# Patient Record
Sex: Female | Born: 1950
Health system: Southern US, Community
[De-identification: ages and names within clinical notes are randomized; demographics above are authoritative.]

## PROBLEM LIST (undated history)

## (undated) DIAGNOSIS — I10 Essential (primary) hypertension: Secondary | ICD-10-CM

## (undated) DIAGNOSIS — I609 Nontraumatic subarachnoid hemorrhage, unspecified: Secondary | ICD-10-CM

## (undated) DIAGNOSIS — F32A Depression, unspecified: Secondary | ICD-10-CM

## (undated) DIAGNOSIS — N390 Urinary tract infection, site not specified: Secondary | ICD-10-CM

## (undated) DIAGNOSIS — E119 Type 2 diabetes mellitus without complications: Secondary | ICD-10-CM

## (undated) DIAGNOSIS — G40909 Epilepsy, unspecified, not intractable, without status epilepticus: Secondary | ICD-10-CM

## (undated) DIAGNOSIS — F909 Attention-deficit hyperactivity disorder, unspecified type: Secondary | ICD-10-CM

## (undated) DIAGNOSIS — I639 Cerebral infarction, unspecified: Secondary | ICD-10-CM

## (undated) DIAGNOSIS — I2699 Other pulmonary embolism without acute cor pulmonale: Secondary | ICD-10-CM

## (undated) DIAGNOSIS — F329 Major depressive disorder, single episode, unspecified: Secondary | ICD-10-CM

## (undated) DIAGNOSIS — K219 Gastro-esophageal reflux disease without esophagitis: Secondary | ICD-10-CM

## (undated) HISTORY — DX: Gastro-esophageal reflux disease without esophagitis: K21.9

## (undated) HISTORY — DX: Major depressive disorder, single episode, unspecified: F32.9

## (undated) HISTORY — DX: Urinary tract infection, site not specified: N39.0

## (undated) HISTORY — DX: Type 2 diabetes mellitus without complications: E11.9

## (undated) HISTORY — DX: Essential (primary) hypertension: I10

## (undated) HISTORY — DX: Depression, unspecified: F32.A

## (undated) HISTORY — PX: ABDOMINAL HYSTERECTOMY: SHX81

## (undated) HISTORY — DX: Cerebral infarction, unspecified: I63.9

## (undated) HISTORY — DX: Attention-deficit hyperactivity disorder, unspecified type: F90.9

---

## 2006-12-27 ENCOUNTER — Emergency Department (HOSPITAL_COMMUNITY): Admission: EM | Admit: 2006-12-27 | Discharge: 2006-12-27 | Payer: Self-pay | Admitting: *Deleted

## 2011-07-08 ENCOUNTER — Emergency Department (HOSPITAL_COMMUNITY)
Admission: EM | Admit: 2011-07-08 | Discharge: 2011-07-08 | Disposition: A | Discharge status: Home / Self Care | Payer: Self-pay | Attending: Emergency Medicine | Admitting: Emergency Medicine

## 2011-07-08 ENCOUNTER — Encounter (HOSPITAL_COMMUNITY): Payer: Self-pay | Admitting: *Deleted

## 2011-07-08 DIAGNOSIS — M545 Low back pain, unspecified: Secondary | ICD-10-CM | POA: Insufficient documentation

## 2011-07-08 DIAGNOSIS — R51 Headache: Secondary | ICD-10-CM

## 2011-07-08 DIAGNOSIS — M549 Dorsalgia, unspecified: Secondary | ICD-10-CM

## 2011-07-08 DIAGNOSIS — I10 Essential (primary) hypertension: Secondary | ICD-10-CM | POA: Insufficient documentation

## 2011-07-08 DIAGNOSIS — A599 Trichomoniasis, unspecified: Secondary | ICD-10-CM | POA: Insufficient documentation

## 2011-07-08 LAB — CBC WITH DIFFERENTIAL/PLATELET
Basophils Absolute: 0 10*3/uL (ref 0.0–0.1)
Basophils Relative: 0 % (ref 0–1)
Hemoglobin: 12.6 g/dL (ref 12.0–15.0)
Lymphocytes Relative: 22 % (ref 12–46)
Monocytes Absolute: 0.5 10*3/uL (ref 0.1–1.0)
Monocytes Relative: 7 % (ref 3–12)
Neutrophils Relative %: 70 % (ref 43–77)
Platelets: 245 10*3/uL (ref 150–400)
RDW: 15 % (ref 11.5–15.5)
WBC: 7.7 10*3/uL (ref 4.0–10.5)

## 2011-07-08 LAB — URINALYSIS, ROUTINE W REFLEX MICROSCOPIC
Bilirubin Urine: NEGATIVE
Glucose, UA: NEGATIVE mg/dL
Ketones, ur: NEGATIVE mg/dL
Protein, ur: NEGATIVE mg/dL
Urobilinogen, UA: 1 mg/dL (ref 0.0–1.0)
pH: 6.5 (ref 5.0–8.0)

## 2011-07-08 LAB — BASIC METABOLIC PANEL
Calcium: 9.7 mg/dL (ref 8.4–10.5)
GFR calc non Af Amer: 67 mL/min — ABNORMAL LOW (ref >90)
Potassium: 3.5 mEq/L (ref 3.5–5.1)
Sodium: 138 mEq/L (ref 135–145)

## 2011-07-08 MED ORDER — METOCLOPRAMIDE HCL 5 MG/ML IJ SOLN
10.0000 mg | Freq: Once | INTRAMUSCULAR | Status: AC
  Administered 2011-07-08: 10 mg via INTRAVENOUS
  Filled 2011-07-08: qty 2

## 2011-07-08 MED ORDER — KETOROLAC TROMETHAMINE 30 MG/ML IJ SOLN
30.0000 mg | Freq: Once | INTRAMUSCULAR | Status: AC
  Administered 2011-07-08: 30 mg via INTRAVENOUS
  Filled 2011-07-08: qty 1

## 2011-07-08 MED ORDER — HYDROCHLOROTHIAZIDE 25 MG PO TABS: 25.0000 mg | ORAL_TABLET | Freq: Every day | ORAL | Status: DC

## 2011-07-08 MED ORDER — HYDROCODONE-ACETAMINOPHEN 5-325 MG PO TABS: ORAL_TABLET | ORAL | Status: AC

## 2011-07-08 MED ORDER — SODIUM CHLORIDE 0.9 % IV BOLUS (SEPSIS)
500.0000 mL | Freq: Once | INTRAVENOUS | Status: AC
  Administered 2011-07-08: 500 mL via INTRAVENOUS

## 2011-07-08 MED ORDER — METRONIDAZOLE 500 MG PO TABS: ORAL_TABLET | ORAL | Status: DC

## 2011-07-08 MED ORDER — SODIUM CHLORIDE 0.9 % IV BOLUS (SEPSIS)
500.0000 mL | Freq: Once | INTRAVENOUS | Status: AC
  Administered 2011-07-08: 17:00:00 via INTRAVENOUS

## 2011-07-08 NOTE — ED Notes (Signed)
Headache with lower back pain and nausea that started x 5 days ago.

## 2011-07-08 NOTE — ED Provider Notes (Signed)
History     CSN: [id number]  Arrival date & time 07/08/11  1608   First MD Initiated Contact with Patient 07/08/11 1623      Chief Complaint  Patient presents with  . Headache  . Back Pain    (Consider location/radiation/quality/duration/timing/severity/associated sxs/prior treatment) HPI Comments: Patient complains of a frontal headache that began 5 days ago. Describes the headache as a throbbing sensation. States the headache is waxing and waning, but not hurting at present. She also reports some nausea and photophobia. Patient also complains of pain to her lower back for 3-4 days. Describes the pain as aching. Pain is worse with movement and bending and improves with rest. She also reports some dysuria and urinary frequency. She denies any abdominal pain, leg pain, perineal numbness, shortness of breath or weakness.  Patient is a 61 y.o. female presenting with headaches and back pain. The history is provided by the patient.  Headache  This is a new problem. The current episode started more than 2 days ago. The problem occurs every few minutes. Progression since onset: waxing and waning. The headache is associated with nothing. The pain is located in the frontal and temporal region. The quality of the pain is described as throbbing. The pain is mild. The pain does not radiate. Associated symptoms include nausea and vomiting. Pertinent negatives include no fever, no malaise/fatigue, no near-syncope, no orthopnea, no palpitations, no syncope and no shortness of breath. She has tried NSAIDs for the symptoms. The treatment provided mild relief.  Back Pain  This is a new problem. The current episode started more than 2 days ago. The problem occurs constantly. The problem has not changed since onset.The pain is associated with no known injury. The pain is present in the lumbar spine. The quality of the pain is described as aching. The pain does not radiate. The pain is moderate. The symptoms are  aggravated by bending, twisting and certain positions. The pain is the same all the time. Associated symptoms include headaches and dysuria. Pertinent negatives include no chest pain, no fever, no numbness, no abdominal pain, no abdominal swelling, no bowel incontinence, no perianal numbness, no bladder incontinence, no pelvic pain, no leg pain, no paresthesias, no paresis, no tingling and no weakness. She has tried NSAIDs for the symptoms. The treatment provided mild relief.    Past Medical History  Diagnosis Date  . Hypertension     Past Surgical History  Procedure Date  . Abdominal hysterectomy     No family history on file.  History  Substance Use Topics  . Smoking status: Never Smoker   . Smokeless tobacco: Not on file  . Alcohol Use: No    OB History    Grav Para Term Preterm Abortions TAB SAB Ect Mult Living                  Review of Systems  Constitutional: Negative for fever, malaise/fatigue, activity change and appetite change.  HENT: Negative for facial swelling, neck pain and neck stiffness.   Eyes: Positive for photophobia. Negative for visual disturbance.  Respiratory: Negative for chest tightness and shortness of breath.   Cardiovascular: Negative for chest pain, palpitations, orthopnea, syncope and near-syncope.  Gastrointestinal: Positive for nausea and vomiting. Negative for abdominal pain, constipation and bowel incontinence.  Genitourinary: Positive for dysuria and frequency. Negative for bladder incontinence, hematuria, flank pain, decreased urine volume, vaginal bleeding, vaginal discharge, difficulty urinating, vaginal pain and pelvic pain.  Perineal numbness or incontinence of urine or feces  Musculoskeletal: Positive for back pain. Negative for joint swelling.  Skin: Negative.  Negative for rash.  Neurological: Positive for headaches. Negative for dizziness, tingling, facial asymmetry, weakness, numbness and paresthesias.  All other systems  reviewed and are negative.    Allergies  Review of patient's allergies indicates no known allergies.  Home Medications   Current Outpatient Rx  Name Route Sig Dispense Refill  . IBUPROFEN 200 MG PO TABS Oral Take 200 mg by mouth once as needed. For pain/relief      Pulse 74  Temp 99.4 F (37.4 C) (Oral)  Resp 20  Ht 5\' 5"  (1.651 m)  Wt 222 lb 7 oz (100.897 kg)  BMI 37.02 kg/m2  SpO2 97%  Physical Exam  Nursing note and vitals reviewed. Constitutional: She is oriented to person, place, and time. She appears well-developed and well-nourished. No distress.  HENT:  Head: Normocephalic and atraumatic.  Mouth/Throat: Oropharynx is clear and moist.  Eyes: EOM are normal. Pupils are equal, round, and reactive to light.  Neck: Normal range of motion and phonation normal. Neck supple. No rigidity. No Brudzinski's sign and no Kernig's sign noted.  Cardiovascular: Normal rate, regular rhythm, normal heart sounds and intact distal pulses.   No murmur heard. Pulmonary/Chest: Effort normal and breath sounds normal. No respiratory distress. She exhibits no tenderness.  Abdominal: Soft. She exhibits no distension and no mass. There is no tenderness. There is no rebound and no guarding.  Musculoskeletal: Normal range of motion. She exhibits tenderness. She exhibits no edema.       Lumbar back: She exhibits tenderness and pain. She exhibits normal range of motion, no swelling, no deformity, no laceration and normal pulse.       Back:  Neurological: She is alert and oriented to person, place, and time. No cranial nerve deficit or sensory deficit. She exhibits normal muscle tone. Coordination and gait normal.  Reflex Scores:      Tricep reflexes are 2+ on the right side and 2+ on the left side.      Bicep reflexes are 2+ on the right side and 2+ on the left side.      Patellar reflexes are 2+ on the right side and 2+ on the left side.      Achilles reflexes are 2+ on the right side and 2+ on  the left side. Skin: Skin is warm and dry.  Psychiatric: She has a normal mood and affect. Thought content normal.    ED Course  Procedures (including critical care time)  Results for orders placed during the hospital encounter of 07/08/11  CBC WITH DIFFERENTIAL      Component Value Range   WBC 7.7  4.0 - 10.5 K/uL   RBC 4.87  3.87 - 5.11 MIL/uL   Hemoglobin 12.6  12.0 - 15.0 g/dL   HCT 91.4  78.2 - 95.6 %   MCV 80.3  78.0 - 100.0 fL   MCH 25.9 (*) 26.0 - 34.0 pg   MCHC 32.2  30.0 - 36.0 g/dL   RDW 21.3  08.6 - 57.8 %   Platelets 245  150 - 400 K/uL   Neutrophils Relative 70  43 - 77 %   Neutro Abs 5.4  1.7 - 7.7 K/uL   Lymphocytes Relative 22  12 - 46 %   Lymphs Abs 1.7  0.7 - 4.0 K/uL   Monocytes Relative 7  3 - 12 %   Monocytes  Absolute 0.5  0.1 - 1.0 K/uL   Eosinophils Relative 0  0 - 5 %   Eosinophils Absolute 0.0  0.0 - 0.7 K/uL   Basophils Relative 0  0 - 1 %   Basophils Absolute 0.0  0.0 - 0.1 K/uL  BASIC METABOLIC PANEL      Component Value Range   Sodium 138  135 - 145 mEq/L   Potassium 3.5  3.5 - 5.1 mEq/L   Chloride 98  96 - 112 mEq/L   CO2 29  19 - 32 mEq/L   Glucose, Bld 147 (*) 70 - 99 mg/dL   BUN 12  6 - 23 mg/dL   Creatinine, Ser 1.61  0.50 - 1.10 mg/dL   Calcium 9.7  8.4 - 09.6 mg/dL   GFR calc non Af Amer 67 (*) >90 mL/min   GFR calc Af Amer 78 (*) >90 mL/min  URINALYSIS, ROUTINE W REFLEX MICROSCOPIC      Component Value Range   Color, Urine YELLOW  YELLOW   APPearance CLEAR  CLEAR   Specific Gravity, Urine 1.015  1.005 - 1.030   pH 6.5  5.0 - 8.0   Glucose, UA NEGATIVE  NEGATIVE mg/dL   Hgb urine dipstick TRACE (*) NEGATIVE   Bilirubin Urine NEGATIVE  NEGATIVE   Ketones, ur NEGATIVE  NEGATIVE mg/dL   Protein, ur NEGATIVE  NEGATIVE mg/dL   Urobilinogen, UA 1.0  0.0 - 1.0 mg/dL   Nitrite NEGATIVE  NEGATIVE   Leukocytes, UA NEGATIVE  NEGATIVE  URINE MICROSCOPIC-ADD ON      Component Value Range   Squamous Epithelial / LPF FEW (*) RARE   WBC,  UA 0-2  <3 WBC/hpf   RBC / HPF 0-2  <3 RBC/hpf   Bacteria, UA FEW (*) RARE   Urine-Other TRICHOMONAS PRESENT    URINE CULTURE      Component Value Range   Specimen Description URINE, CLEAN CATCH     Special Requests NONE     Culture  Setup Time 07/09/2011 19:59     Colony Count 10,000 COLONIES/ML     Culture       Value: Multiple bacterial morphotypes present, none predominant. Suggest appropriate recollection if clinically indicated.   Report Status 07/10/2011 FINAL         Urine culture is pending  MDM    Patient has ttp of the lumbar paraspinal muscles.  No focal neuro deficits on exam.  Ambulates with a steady gait.  Vitals stable,  Pt is non-toxic appearing.  No focal neuro deficits, no meningeal signs.  Headache of gradual onset that is waxing and waning, not hurting at present. Doubt infectious or neurological process. Patient agrees to close f/u with PMD or to return here if the symptoms worsen.      Will treat her for trichomonas.  Pt also hypertensive during ED stay.  Has hx of same but states she does not currently take antihypertensive medication but has been prescribed in the past.  I will start HCTZ and pt agrees to close f/u with the health dept and verbalized understanding of instructions.  The patient appears reasonably screened and/or stabilized for discharge and I doubt any other medical condition or other [facility] requiring further screening, evaluation, or treatment in the ED at this time prior to discharge.   Diania Co L. Hanna, [place] 07/12/11 2221

## 2011-07-10 LAB — URINE CULTURE

## 2011-07-13 NOTE — ED Provider Notes (Signed)
Medical screening examination/treatment/procedure(s) were performed by non-physician practitioner and as supervising physician I was immediately available for consultation/collaboration.   Espyn Radwan M Lindsey Hommel, DO 07/13/11 1251 

## 2011-08-16 ENCOUNTER — Inpatient Hospital Stay (HOSPITAL_COMMUNITY)
Admission: EM | Admit: 2011-08-16 | Discharge: 2011-09-08 | DRG: 020 | Disposition: A | Discharge status: Rehabilitation Facility | Payer: Medicaid Other | Attending: Neurosurgery | Admitting: Neurosurgery

## 2011-08-16 ENCOUNTER — Encounter (HOSPITAL_COMMUNITY): Payer: Self-pay | Admitting: *Deleted

## 2011-08-16 ENCOUNTER — Emergency Department (HOSPITAL_COMMUNITY): Payer: Medicaid Other

## 2011-08-16 DIAGNOSIS — G9389 Other specified disorders of brain: Secondary | ICD-10-CM | POA: Diagnosis not present

## 2011-08-16 DIAGNOSIS — J690 Pneumonitis due to inhalation of food and vomit: Secondary | ICD-10-CM | POA: Diagnosis present

## 2011-08-16 DIAGNOSIS — H5010 Unspecified exotropia: Secondary | ICD-10-CM | POA: Diagnosis present

## 2011-08-16 DIAGNOSIS — Z8249 Family history of ischemic heart disease and other diseases of the circulatory system: Secondary | ICD-10-CM

## 2011-08-16 DIAGNOSIS — J9600 Acute respiratory failure, unspecified whether with hypoxia or hypercapnia: Secondary | ICD-10-CM | POA: Diagnosis present

## 2011-08-16 DIAGNOSIS — G9340 Encephalopathy, unspecified: Secondary | ICD-10-CM | POA: Diagnosis present

## 2011-08-16 DIAGNOSIS — R7309 Other abnormal glucose: Secondary | ICD-10-CM | POA: Diagnosis not present

## 2011-08-16 DIAGNOSIS — I609 Nontraumatic subarachnoid hemorrhage, unspecified: Secondary | ICD-10-CM

## 2011-08-16 DIAGNOSIS — Z6835 Body mass index (BMI) 35.0-35.9, adult: Secondary | ICD-10-CM

## 2011-08-16 DIAGNOSIS — Z515 Encounter for palliative care: Secondary | ICD-10-CM

## 2011-08-16 DIAGNOSIS — I739 Peripheral vascular disease, unspecified: Secondary | ICD-10-CM | POA: Diagnosis present

## 2011-08-16 DIAGNOSIS — G911 Obstructive hydrocephalus: Secondary | ICD-10-CM | POA: Diagnosis present

## 2011-08-16 DIAGNOSIS — Z823 Family history of stroke: Secondary | ICD-10-CM

## 2011-08-16 DIAGNOSIS — E739 Lactose intolerance, unspecified: Secondary | ICD-10-CM | POA: Diagnosis present

## 2011-08-16 DIAGNOSIS — Z90710 Acquired absence of both cervix and uterus: Secondary | ICD-10-CM

## 2011-08-16 DIAGNOSIS — I10 Essential (primary) hypertension: Secondary | ICD-10-CM | POA: Diagnosis present

## 2011-08-16 DIAGNOSIS — R58 Hemorrhage, not elsewhere classified: Secondary | ICD-10-CM

## 2011-08-16 DIAGNOSIS — E876 Hypokalemia: Secondary | ICD-10-CM | POA: Diagnosis not present

## 2011-08-16 HISTORY — PX: ANEURYSM COILING: SHX5349

## 2011-08-16 HISTORY — DX: Nontraumatic subarachnoid hemorrhage, unspecified: I60.9

## 2011-08-16 LAB — MRSA PCR SCREENING: MRSA by PCR: NEGATIVE

## 2011-08-16 LAB — BLOOD GAS, ARTERIAL
Acid-base deficit: 0.1 mmol/L (ref 0.0–2.0)
FIO2: 50 %
MECHVT: 600 mL
O2 Saturation: 96.5 %
TCO2: 21.4 mmol/L (ref 0–100)
pCO2 arterial: 40 mmHg (ref 35.0–45.0)

## 2011-08-16 LAB — CBC WITH DIFFERENTIAL/PLATELET
Basophils Absolute: 0.1 10*3/uL (ref 0.0–0.1)
Basophils Relative: 0 % (ref 0–1)
Eosinophils Absolute: 0.2 10*3/uL (ref 0.0–0.7)
HCT: 38.4 % (ref 36.0–46.0)
Hemoglobin: 12.4 g/dL (ref 12.0–15.0)
MCHC: 32.3 g/dL (ref 30.0–36.0)
MCV: 82.1 fL (ref 78.0–100.0)
Monocytes Absolute: 0.5 10*3/uL (ref 0.1–1.0)
Monocytes Relative: 4 % (ref 3–12)
Platelets: 303 10*3/uL (ref 150–400)
RDW: 15.2 % (ref 11.5–15.5)
WBC: 12.4 10*3/uL — ABNORMAL HIGH (ref 4.0–10.5)

## 2011-08-16 LAB — COMPREHENSIVE METABOLIC PANEL
AST: 45 U/L — ABNORMAL HIGH (ref 0–37)
Chloride: 100 mEq/L (ref 96–112)
GFR calc non Af Amer: 61 mL/min — ABNORMAL LOW (ref >90)
Sodium: 138 mEq/L (ref 135–145)
Total Bilirubin: 0.5 mg/dL (ref 0.3–1.2)

## 2011-08-16 LAB — RAPID URINE DRUG SCREEN, HOSP PERFORMED
Amphetamines: NOT DETECTED
Benzodiazepines: NOT DETECTED
Tetrahydrocannabinol: NOT DETECTED

## 2011-08-16 LAB — TROPONIN I: Troponin I: <0.3 ng/mL (ref <0.30)

## 2011-08-16 LAB — CK TOTAL AND CKMB (NOT AT ARMC)
CK, MB: 6.4 ng/mL (ref 0.3–4.0)
Relative Index: 1.5 (ref 0.0–2.5)
Total CK: 428 U/L — ABNORMAL HIGH (ref 7–177)

## 2011-08-16 LAB — ETHANOL: Alcohol, Ethyl (B): <11 mg/dL (ref 0–11)

## 2011-08-16 MED ORDER — IPRATROPIUM BROMIDE 0.02 % IN SOLN: 0.5000 mg | RESPIRATORY_TRACT | Status: DC | PRN

## 2011-08-16 MED ORDER — ROCURONIUM BROMIDE 50 MG/5ML IV SOLN
INTRAVENOUS | Status: AC
  Filled 2011-08-16: qty 2

## 2011-08-16 MED ORDER — LIDOCAINE HCL (CARDIAC) 20 MG/ML IV SOLN
INTRAVENOUS | Status: AC
  Filled 2011-08-16: qty 5

## 2011-08-16 MED ORDER — LORAZEPAM 2 MG/ML IJ SOLN
2.0000 mg | Freq: Once | INTRAMUSCULAR | Status: AC
  Administered 2011-08-16: 2 mg via INTRAVENOUS
  Filled 2011-08-16: qty 1

## 2011-08-16 MED ORDER — ALBUTEROL SULFATE (5 MG/ML) 0.5% IN NEBU: 2.5000 mg | INHALATION_SOLUTION | RESPIRATORY_TRACT | Status: DC | PRN

## 2011-08-16 MED ORDER — ETOMIDATE 2 MG/ML IV SOLN
INTRAVENOUS | Status: AC
  Administered 2011-08-16: 06:00:00
  Filled 2011-08-16: qty 20

## 2011-08-16 MED ORDER — MORPHINE SULFATE 2 MG/ML IJ SOLN
2.0000 mg | INTRAMUSCULAR | Status: DC | PRN
  Administered 2011-08-16 – 2011-08-18 (×10): 2 mg via INTRAVENOUS
  Filled 2011-08-16 (×10): qty 1

## 2011-08-16 MED ORDER — BIOTENE DRY MOUTH MT LIQD
15.0000 mL | Freq: Four times a day (QID) | OROMUCOSAL | Status: DC
  Administered 2011-08-16 – 2011-08-24 (×32): 15 mL via OROMUCOSAL

## 2011-08-16 MED ORDER — POTASSIUM CHLORIDE 10 MEQ/100ML IV SOLN
10.0000 meq | INTRAVENOUS | Status: AC
  Administered 2011-08-16 (×4): 10 meq via INTRAVENOUS
  Filled 2011-08-16: qty 100
  Filled 2011-08-16: qty 300

## 2011-08-16 MED ORDER — MIDAZOLAM HCL 2 MG/2ML IJ SOLN
2.0000 mg | INTRAMUSCULAR | Status: DC | PRN
  Filled 2011-08-16: qty 2

## 2011-08-16 MED ORDER — PIPERACILLIN-TAZOBACTAM 3.375 G IVPB
3.3750 g | Freq: Three times a day (TID) | INTRAVENOUS | Status: DC
  Administered 2011-08-16: 3.375 g via INTRAVENOUS
  Filled 2011-08-16 (×4): qty 50

## 2011-08-16 MED ORDER — IPRATROPIUM BROMIDE 0.02 % IN SOLN
0.5000 mg | RESPIRATORY_TRACT | Status: DC
  Administered 2011-08-16: 0.5 mg via RESPIRATORY_TRACT
  Filled 2011-08-16: qty 2.5

## 2011-08-16 MED ORDER — SODIUM CHLORIDE 0.9 % IJ SOLN
INTRAMUSCULAR | Status: AC
  Administered 2011-08-16: 10 mL
  Filled 2011-08-16: qty 3

## 2011-08-16 MED ORDER — ALBUTEROL SULFATE (5 MG/ML) 0.5% IN NEBU
2.5000 mg | INHALATION_SOLUTION | RESPIRATORY_TRACT | Status: DC
  Administered 2011-08-16: 2.5 mg via RESPIRATORY_TRACT
  Filled 2011-08-16: qty 0.5

## 2011-08-16 MED ORDER — ALBUTEROL SULFATE (5 MG/ML) 0.5% IN NEBU: 2.5000 mg | INHALATION_SOLUTION | RESPIRATORY_TRACT | Status: DC

## 2011-08-16 MED ORDER — LORAZEPAM 2 MG/ML IJ SOLN
2.0000 mg | Freq: Once | INTRAMUSCULAR | Status: AC
Start: 2011-08-16 — End: 2011-08-16
  Administered 2011-08-16: 2 mg via INTRAVENOUS

## 2011-08-16 MED ORDER — LORAZEPAM 2 MG/ML IJ SOLN
1.0000 mg | INTRAMUSCULAR | Status: DC | PRN
  Administered 2011-08-18: 1 mg via INTRAVENOUS
  Filled 2011-08-16: qty 1

## 2011-08-16 MED ORDER — SODIUM CHLORIDE 0.9 % IV SOLN: 250.0000 mL | INTRAVENOUS | Status: DC | PRN

## 2011-08-16 MED ORDER — PANTOPRAZOLE SODIUM 40 MG IV SOLR
40.0000 mg | Freq: Once | INTRAVENOUS | Status: AC
  Administered 2011-08-16: 40 mg via INTRAVENOUS
  Filled 2011-08-16: qty 40

## 2011-08-16 MED ORDER — VANCOMYCIN HCL 1000 MG IV SOLR
1250.0000 mg | Freq: Two times a day (BID) | INTRAVENOUS | Status: DC
  Administered 2011-08-16: 1250 mg via INTRAVENOUS
  Filled 2011-08-16 (×3): qty 1250

## 2011-08-16 MED ORDER — CHLORHEXIDINE GLUCONATE 0.12 % MT SOLN
15.0000 mL | Freq: Two times a day (BID) | OROMUCOSAL | Status: DC
  Administered 2011-08-16 – 2011-08-27 (×23): 15 mL via OROMUCOSAL
  Filled 2011-08-16 (×22): qty 15

## 2011-08-16 MED ORDER — ONDANSETRON HCL 4 MG/2ML IJ SOLN
4.0000 mg | Freq: Once | INTRAMUSCULAR | Status: AC
  Administered 2011-08-16: 4 mg via INTRAVENOUS
  Filled 2011-08-16: qty 2

## 2011-08-16 MED ORDER — MIDAZOLAM HCL 2 MG/2ML IJ SOLN
2.0000 mg | Freq: Once | INTRAMUSCULAR | Status: AC
  Administered 2011-08-16: 2 mg via INTRAVENOUS
  Filled 2011-08-16: qty 2

## 2011-08-16 MED ORDER — SUCCINYLCHOLINE CHLORIDE 20 MG/ML IJ SOLN
INTRAMUSCULAR | Status: AC
  Administered 2011-08-16: 06:00:00
  Filled 2011-08-16: qty 1

## 2011-08-16 MED ORDER — LORAZEPAM 2 MG/ML IJ SOLN
INTRAMUSCULAR | Status: AC
  Administered 2011-08-16: 2 mg
  Filled 2011-08-16: qty 1

## 2011-08-16 NOTE — H&P (Signed)
[facility] History and Physical  [name] YQM:[id number] DOB: Oct 05, 1950 DOA: 08/16/2011  Referring physician: Dr. Colon Branch PCP: No primary provider on file.   Chief Complaint: unresponsive  HPI:  This is a 61 year old African American female with history of hypertension who was at home in her usual state of health. Her husband reports that around 4 AM the patient was going to the bathroom and complained of a severe headache. She is screaming in pain due to the headache. Her husband went to call 911, and when he returned to his wife she was unresponsive. He had noted some frothy secretions coming from her mouth. She was brought to the emergency room for evaluation where CT of her head showed diffuse subarachnoid hemorrhage. Chest x-ray was revealed likely aspiration pneumonia. Patient was intubated in the emergency room and is unable to provide any history.  Review of Systems:  Unable to assess due to patient's mental status, per patient's family, prior to the current event patient did not have any complaints  Past Medical History  Diagnosis Date  . Hypertension    Past Surgical History  Procedure Date  . Abdominal hysterectomy    Social History:  reports that she has never smoked. She does not have any smokeless tobacco history on file. She reports that she does not drink alcohol or use illicit drugs.   Allergies  Allergen Reactions  . Lactose Intolerance (Gi) Other (See Comments)    G.IDorena Dew    Family history: Her stroke and MI in her 26s. Hypertension runs in the family. No history of premature strokes   Prior to Admission medications   Medication Sig Start Date End Date Taking? Authorizing Provider  cloNIDine (CATAPRES) 0.1 MG tablet Take 0.1 mg by mouth 2 (two) times daily.   Yes Historical Provider, MD  hydrochlorothiazide (HYDRODIURIL) 25 MG tablet Take 1 tablet (25 mg total) by mouth daily. 07/08/11 07/07/12 Yes Tammy L. Triplett, PA  ibuprofen (ADVIL,MOTRIN)  800 MG tablet Take 800 mg by mouth every 8 (eight) hours as needed. Pain   Yes Historical Provider, MD  methylcellulose (ARTIFICIAL TEARS) 1 % ophthalmic solution Place 1 drop into both eyes as needed. Dry Eyes   Yes Historical Provider, MD   Physical Exam: Filed Vitals:   08/16/11 0721 08/16/11 0754 08/16/11 0815 08/16/11 0900  BP:  178/88 179/75 176/99  Pulse:  66 65 78  Temp:  100.8 F (38.2 C) 100.6 F (38.1 C) 100.5 F (38.1 C)  TempSrc:  Other (Comment)    Resp:  14 14 15   SpO2: 100% 99% 100% 100%     General:  The patient is intubated and sedated on a ventilator  Eyes: Pupils are round, react to light bilaterally  ENT: Normocephalic, atraumatic  Neck: Supple  Cardiovascular: S1, S2, regular rate and rhythm  Respiratory: Diffuse rhonchi bilaterally  Abdomen: Soft, nontender, nondistended, bowel sounds are active  Skin: No visible rashes  Musculoskeletal: Deferred  Psychiatric: Deferred  Neurologic: Cranial nerves appear to be intact, cannot assess motor exam due to mental status. patient has minimal reaction to pain  Labs on Admission:  Basic Metabolic Panel:  Lab 08/16/11 5284  NA 138  K 3.1*  CL 100  CO2 24  GLUCOSE 263*  BUN 17  CREATININE 0.97  CALCIUM 9.0  MG --  PHOS --   Liver Function Tests:  Lab 08/16/11 0630  AST 45*  ALT 27  ALKPHOS 82  BILITOT 0.5  PROT 7.2  ALBUMIN 3.3*  No results found for this basename: LIPASE:5,AMYLASE:5 in the last 168 hours No results found for this basename: AMMONIA:5 in the last 168 hours CBC:  Lab 08/16/11 0630  WBC 12.4*  NEUTROABS 7.8*  HGB 12.4  HCT 38.4  MCV 82.1  PLT 303   Cardiac Enzymes:  Lab 08/16/11 0630  CKTOTAL 428*  CKMB 6.4*  CKMBINDEX --  TROPONINI <0.30    BNP (last 3 results) No results found for this basename: PROBNP:3 in the last 8760 hours CBG: No results found for this basename: GLUCAP:5 in the last 168 hours  Radiological Exams on Admission: Ct Head Wo  Contrast  08/16/2011  *RADIOLOGY REPORT*  Clinical Data: Unresponsive patient. Headache.  CT HEAD WITHOUT CONTRAST  Technique:  Contiguous axial images were obtained from the base of the skull through the vertex without contrast.  Comparison: None.  Findings: Study is degraded by motion artifact.  There is diffuse subarachnoid hemorrhage.  This fills the suprasellar cistern. There is also blood tracking along the falx, probably also subarachnoid.  No hydrocephalus.  No midline shift.  No mass lesion or mass effect.  There is no skull fracture identified allowing for motion artifact.  IMPRESSION: Diffuse subarachnoid hemorrhage.  Study degraded by motion artifact. Critical Value/emergent results were called by telephone at the time of interpretation on 08/16/2011 at 0626 hours to Dr. Colon Branch, who verbally acknowledged these results.  Original Report Authenticated By: Andreas Newport, M.D.   Dg Chest Portable 1 View  08/16/2011  *RADIOLOGY REPORT*  Clinical Data: Loss of consciousness.  PORTABLE CHEST - 1 VIEW  Comparison: Chest x-ray 12/27/2006.  Findings: An endotracheal tube is in place with tip 1.5 cm above the carina. A nasogastric tube is seen extending into the stomach, however, the tip of the nasogastric tube extends below the lower margin of the image.  Lung volumes are low.  Bibasilar opacities (left greater than right) may represent areas of atelectasis and/or consolidation.  In addition, there are patchy interstitial and airspace foci scattered throughout the lungs bilaterally, most pronounced throughout the right mid and upper lung, as well as the left suprahilar region.  Mild blunting of the left costophrenic sulcus may represent small left pleural effusion.  Pulmonary venous congestion without frank pulmonary edema.  Heart size is within normal limits. The patient is rotated to the left on today's exam, resulting in distortion of the mediastinal contours and reduced diagnostic sensitivity and  specificity for mediastinal pathology.  IMPRESSION: 1.  Support apparatus, as above.  The state node of the low-lying endotracheal tube and consider withdrawal approximately 3 cm for more optimal placement. 2.  Patchy interstitial and airspace disease scattered throughout the lungs bilaterally, with an asymmetric distribution.  Findings are concerning for multilobar pneumonia or sequela of aspiration. 3.  Bibasilar subsegmental atelectasis. 4.  Possible small left pleural effusion.  Original Report Authenticated By: Florencia Reasons, M.D.    EKG: Independently reviewed. Does not show any acute ST or T changes  Assessment/Plan Principal Problem:  *Subarachnoid hemorrhage Active Problems:  Encephalopathy  Aspiration pneumonia  Acute respiratory failure  HTN (hypertension)   1. Subarachnoid hemorrhage. Dr. Colon Branch has discussed the CT findings with neurosurgeon on-call Dr. Wynetta Emery. No neurosurgical intervention was recommended. Palliative care/comfort care was recommended. This was communicated with the family. Understandably, they're having a difficult time coming to terms with the patient's sudden decline. They have multiple family members coming in from out of town. They wish to hold off on withdrawing care until those  family members are present. Once the entire family has arrived, they will likely elect to extubate the patient and provide comfort care. 2. Encephalopathy secondary to #1. It does not appear that she can protect her airway. Patient is n.p.o. for now. 3. Aspiration pneumonia. We'll start the patient on empiric antibiotics until final decision is made to withdraw care. 4. Acute respiratory failure. Continue ventilator support until final decision make to withdraw care.  Code Status: Full code, but will likely become comfort care once the remainder of the family has arrived  Family Communication: Discuss with husband and multiple family members at the bedside Disposition Plan: Patient  will be admitted to the ICU since she is on a ventilator until the remainder of the family arrives. Once decision is made for comfort care we will consult hospice. She is likely a good candidate for a residential hospice facility.  Time spent: 60 mins  MEMON,JEHANZEB [facility] Pager [phone]  If 7PM-7AM, please contact night-coverage [email or web address] Password [facility] 08/16/2011, 9:24 AM

## 2011-08-16 NOTE — Procedures (Signed)
Extubation Procedure Note  Patient Details:   Name: [name] DOB: 11/16/1950 MRN: [id number]   Airway Documentation:  Airway 7.5 mm (Active)  Secured at (cm) 23 cm 08/16/2011 10:53 AM  Measured From Lips 08/16/2011 10:53 AM  Secured Location Right 08/16/2011 10:53 AM  Secured By [employer] 08/16/2011 10:53 AM  Tube Holder Repositioned Yes 08/16/2011 10:53 AM  Site Condition Dry 08/16/2011 10:53 AM    Evaluation  O2 sats: stable throughout Complications: No apparent complications Patient did tolerate procedure well. Bilateral Breath Sounds: Rhonchi   Pt extubated terminally per doctor order and placed on 6 lpm nasal cannula.  Patient tolerated procedure well, pt RR is 20-35, taking shallow breaths, sats at this time are 100%, family is at bedside.  Will continue to monitor.  Jerelyn Scott Billingsley 08/16/2011, 3:19 PM

## 2011-08-16 NOTE — Progress Notes (Signed)
Utilization Review Complete  

## 2011-08-16 NOTE — Progress Notes (Addendum)
I had a lengthy discussion with the patient's children and multiple other family members.  Discussed patient's current clinical condition, treatment options and expected prognosis.  Explained that patient has had an extensive subarachnoid hemorrhage, was unresponsive, developed aspiration pneumonia and respiratory failure requiring mechanical ventilation.  Case was reviewed with neurosurgery by ED physician and no interventions were offered.  Comfort care was recommended.  Patient's family understands and wishes to pursue comfort care.  They agree to extubate the patient and focus on her quality of life.  They are agreeable to meet with hospice.  She will likely be a good candidate for a residential hospice. Will discontinue medications that are not directly related to her comfort.  Cheyene Hamric   At the request of the patient's family, I discussed the patient's case with stroke MD, Dr. Milas Kocher at [facility]. We discussed the patient's current clinical condition and CT head findings. It was Dr. Traci Sermon opinion that based on her current physical findings and extent of subarachnoid hemorrhage, that comfort care would be the best approach. He would not add anything further to her care. This was explained to the patient's husband who understood. At this time, I do not see any added benefit in transferring the patient to a higher level of care.  Will continue to monitor the patient and recommend hospice involvement  Brynlyn Dade

## 2011-08-16 NOTE — ED Notes (Signed)
Pt intubated by Dr. Colon Branch.  7.5 F, marked 23cm at lip.

## 2011-08-16 NOTE — ED Notes (Signed)
Pt to CT

## 2011-08-16 NOTE — ED Notes (Signed)
Pt's ck-mb called to rn  6.4, dr.strand notified.

## 2011-08-16 NOTE — Consult Note (Signed)
ANTIBIOTIC CONSULT NOTE - INITIAL  Pharmacy Consult for Vancomycin and Zosyn  Indication: pneumonia  Allergies  Allergen Reactions  . Lactose Intolerance (Gi) Other (See Comments)    G.I. Upset   Patient Measurements: Height: 5\' 9"  (175.3 cm) Weight: 224 lb (101.606 kg) IBW/kg (Calculated) : 66.2   Vital Signs: Temp: 100.5 F (38.1 C) (08/13 1052) Temp src: Oral (08/13 1052) BP: 164/76 mmHg (08/13 1052) Pulse Rate: 67  (08/13 1052) Intake/Output from previous day:   Intake/Output from this shift:    Labs:  Basename 08/16/11 0630  WBC 12.4*  HGB 12.4  PLT 303  LABCREA --  CREATININE 0.97   Estimated Creatinine Clearance: 75.3 ml/min (by C-G formula based on Cr of 0.97). No results found for this basename: VANCOTROUGH:2,VANCOPEAK:2,VANCORANDOM:2,GENTTROUGH:2,GENTPEAK:2,GENTRANDOM:2,TOBRATROUGH:2,TOBRAPEAK:2,TOBRARND:2,AMIKACINPEAK:2,AMIKACINTROU:2,AMIKACIN:2, in the last 72 hours   Microbiology: No results found for this or any previous visit (from the past 720 hour(s)).  Medical History: Past Medical History  Diagnosis Date  . Hypertension    Medications:  Scheduled:    . albuterol  2.5 mg Nebulization Q4H   And  . ipratropium  0.5 mg Nebulization Q4H  . antiseptic oral rinse  15 mL Mouth Rinse QID  . chlorhexidine  15 mL Mouth Rinse BID  . etomidate      . lidocaine (cardiac) 100 mg/63ml      . LORazepam      . LORazepam  2 mg Intravenous Once  . LORazepam  2 mg Intravenous Once  . LORazepam  2 mg Intravenous Once  . LORazepam  2 mg Intravenous Once  . midazolam  2-4 mg Intravenous Once  . ondansetron  4 mg Intravenous Once  . pantoprazole (PROTONIX) IV  40 mg Intravenous Once  . piperacillin-tazobactam (ZOSYN)  IV  3.375 g Intravenous Q8H  . potassium chloride  10 mEq Intravenous Q1 Hr x 4  . rocuronium      . succinylcholine      . vancomycin  1,250 mg Intravenous Q12H   Assessment: 61yoF admitted for subarachnoid hemorrhage and aspiration pna.   Awaiting family decision regarding comfort care   Goal of Therapy:  Vancomycin trough level 15-20 mcg/ml  Plan: Zosyn 3.375gm iv q8hrs Vancomycin 1250mg  iv q12hrs Trough level at steady state if ABX continued F/U decision re: comfort care  [name] A 08/16/2011,11:15 AM

## 2011-08-16 NOTE — ED Notes (Signed)
Per pt's husband, pt got up to go to the bathroom about 0400 and began screaming that her head hurt, pt then lost consciousness at that time, and EMS was called.  Pt's husband presently in family waiting area to discuss with Dr. Colon Branch.

## 2011-08-16 NOTE — ED Notes (Signed)
Pt to department by EMS.  Reports initial call was for headache, when EMS arrived pt unconscious. Pt had been incontinent. Non-verbal, non-responsive at this time.

## 2011-08-16 NOTE — ED Provider Notes (Addendum)
History     CSN: [id number]  Arrival date & time 08/16/11  0546   First MD Initiated Contact with Patient 08/16/11 [phone]      Chief Complaint  Patient presents with  . Loss of Consciousness    (Consider location/radiation/quality/duration/timing/severity/associated sxs/prior treatment) HPI Level 5 caveat due to unresponsiveness [name] is a 61 y.o. female brought in by ambulance, who presents to the Emergency Department complaining of unresponsiveness after experiencing a headache. Per husband patient got up to go to the bathroom, he thought around 4 AM, complained about a severe headache and collapsed. Patient was unresponsive when EMS arrived and remained unresponsive. Per her husband she has not had a recent headache although she has had headaches in the past with the last visit to the ER for a headache 07/08/2011. She works at [employer] and had worked for two days and spent the night at her sister's. She returned home yesterday. No complaints of illness yesterday.  Past Medical History  Diagnosis Date  . Hypertension     Past Surgical History  Procedure Date  . Abdominal hysterectomy     History reviewed. No pertinent family history.  History  Substance Use Topics  . Smoking status: Never Smoker   . Smokeless tobacco: Not on file  . Alcohol Use: No    OB History    Grav Para Term Preterm Abortions TAB SAB Ect Mult Living                  Review of Systems  Unable to perform ROS: Other    Allergies  Review of patient's allergies indicates no known allergies.  Home Medications   Current Outpatient Rx  Name Route Sig Dispense Refill  . CLONIDINE HCL 0.1 MG PO TABS Oral Take 0.1 mg by mouth 2 (two) times daily.    Marland Kitchen HYDROCHLOROTHIAZIDE 25 MG PO TABS Oral Take 1 tablet (25 mg total) by mouth daily. 20 tablet 0  . IBUPROFEN 200 MG PO TABS Oral Take 200 mg by mouth once as needed. For pain/relief    . METRONIDAZOLE 500 MG PO TABS  Take four tabs  po as a single dose 4 tablet 0    There were no vitals taken for this visit.  Physical Exam  Nursing note and vitals reviewed. Constitutional: She appears well-developed and well-nourished.       Unresponsive, no spontaneous movement  HENT:  Head: Normocephalic and atraumatic.  Right Ear: External ear normal.  Left Ear: External ear normal.  Nose: Nose normal.  Mouth/Throat: Oropharynx is clear and moist.  Eyes: Right eye exhibits no discharge. Left eye exhibits no discharge.       Pupils 4mm with poor response to light  Neck: Normal range of motion. Neck supple. No JVD present.  Cardiovascular: Normal rate and normal heart sounds.   Pulmonary/Chest: Effort normal. She exhibits no tenderness.       Crackles at both bases.  Abdominal: Soft. Bowel sounds are normal. There is no tenderness. There is no rebound.  Musculoskeletal: Normal range of motion. She exhibits no tenderness.       Baseline ROM, no obvious new focal weakness.  Neurological:       Unresponsive. No spontaneous movement.  GLASGOW COMA SCORE = 3  Eyes Open: spontaneously 4, to voice 3, to pain 2, none 1 Speech: nml 5, disoriented 4, inapprop. 3, incoherent 2, none 1 Motor: nml 6, localizes 5, withdraws 4, flexor 3, ext. 2, none 1.  MODIFIED NIH STROKE SCALE =2+2+unable to assess visual+3+3+3+3+1+3 ++  >20  LOC questions Beryle Flock is the month? Your age?) 0= answers both correctly,1=answers one correctly,2=answers neither correctly  LOC commands (open/close eyes. Grip/ungrip hand) 0=performs both correctly,1=performs one correctly,2=performs neither  Gaze 0=nml,1=partial gaze palsy,2=total gaze palsy  Visual Fields 0=no loss, 1=partial hemianopsia, 2=complete hemianopsia, 3=bilateral hemianopsia  Limbs (left arm, left leg, right arm, right leg) 0=no drift, 1=drift before 5 (leg) or 10 (arm), 2=falls before 5 (leg) or 10 (arm),  3=no effort against gravity, 4= no movement  Sensory 0= nml, 1=  abnml  Language 0= nml, 1= mild aphasia, 2=severe aphasia, 3= mute or global aphasia  Neglect 0= nml, 1= mild, 2= severe   Neuroexamination/Mental Status The patient is unresponsive, no reaction to painful stimuli.are intact.CRANIAL NERVES: I-not tested. II-Pupils are 4+ and 4+. No corneal response using a Q tip. IX,X-Gag is not present bilaterally.  XII-Tongue is in the midline with no fasciculations or atrophy. MOTOR EXAM; No spontaneous movements of extremities. No posturing. DEEP TENDON REFLEXES: Unable to assess    Skin: Skin is warm and dry. No rash noted.  Psychiatric: She has a normal mood and affect.    ED Course  Procedures (including critical care time) Results for orders placed during the hospital encounter of 08/16/11  CBC WITH DIFFERENTIAL      Component Value Range   WBC 12.4 (*) 4.0 - 10.5 K/uL   RBC 4.68  3.87 - 5.11 MIL/uL   Hemoglobin 12.4  12.0 - 15.0 g/dL   HCT 45.4  09.8 - 11.9 %   MCV 82.1  78.0 - 100.0 fL   MCH 26.5  26.0 - 34.0 pg   MCHC 32.3  30.0 - 36.0 g/dL   RDW 14.7  82.9 - 56.2 %   Platelets 303  150 - 400 K/uL   Neutrophils Relative 63  43 - 77 %   Neutro Abs 7.8 (*) 1.7 - 7.7 K/uL   Lymphocytes Relative 31  12 - 46 %   Lymphs Abs 3.8  0.7 - 4.0 K/uL   Monocytes Relative 4  3 - 12 %   Monocytes Absolute 0.5  0.1 - 1.0 K/uL   Eosinophils Relative 2  0 - 5 %   Eosinophils Absolute 0.2  0.0 - 0.7 K/uL   Basophils Relative 0  0 - 1 %   Basophils Absolute 0.1  0.0 - 0.1 K/uL  ETHANOL      Component Value Range   Alcohol, Ethyl (B) <11  0 - 11 mg/dL  URINE RAPID DRUG SCREEN (HOSP PERFORMED)      Component Value Range   Opiates NONE DETECTED  NONE DETECTED   Cocaine NONE DETECTED  NONE DETECTED   Benzodiazepines NONE DETECTED  NONE DETECTED   Amphetamines NONE DETECTED  NONE DETECTED   Tetrahydrocannabinol NONE DETECTED  NONE DETECTED   Barbiturates NONE DETECTED  NONE DETECTED  COMPREHENSIVE METABOLIC PANEL      Component Value Range    Sodium 138  135 - 145 mEq/L   Potassium 3.1 (*) 3.5 - 5.1 mEq/L   Chloride 100  96 - 112 mEq/L   CO2 24  19 - 32 mEq/L   Glucose, Bld 263 (*) 70 - 99 mg/dL   BUN 17  6 - 23 mg/dL   Creatinine, Ser 1.30  0.50 - 1.10 mg/dL   Calcium 9.0  8.4 - 86.5 mg/dL   Total Protein 7.2  6.0 - 8.3 g/dL  Albumin 3.3 (*) 3.5 - 5.2 g/dL   AST 45 (*) 0 - 37 U/L   ALT 27  0 - 35 U/L   Alkaline Phosphatase 82  39 - 117 U/L   Total Bilirubin 0.5  0.3 - 1.2 mg/dL   GFR calc non Af Amer 61 (*) >90 mL/min   GFR calc Af Amer 71 (*) >90 mL/min  TROPONIN I      Component Value Range   Troponin I <0.30  <0.30 ng/mL  CK TOTAL AND CKMB      Component Value Range   Total CK 428 (*) 7 - 177 U/L   CK, MB 6.4 (*) 0.3 - 4.0 ng/mL   Relative Index 1.5  0.0 - 2.5    Date: 08/16/2011  0553  Rate: 61  Rhythm: normal sinus rhythm  QRS Axis: normal  Intervals: normal  ST/T Wave abnormalities: nonspecific ST/T changes  Conduction Disutrbances:none  Narrative Interpretation:   Old EKG Reviewed: none available  INTUBATION Performed by: Annamarie Dawley.  Required items: required blood products, implants, devices, and special equipment available Patient identity confirmed: provided demographic data and hospital-assigned identification number Time out was not performed due to the emergent need for intubation. Indications:agonal respirations, unresponsiveness Intubation method:#3 Mack blade Preoxygenation BVM Sedatives: 8 mg Etomidate Paralytic:  150 mg Succinylcholine Tube Size: 8 cuffed Post-procedure assessment: chest rise and ETCO2 monitor Breath sounds: equal and absent over the epigastrium Tube secured with: ETT holder Chest x-ray interpreted by radiologist and me. Chest x-ray findings: endotracheal tube in appropriate position Patient tolerated the procedure well with no immediate complications.   Ct Head Wo Contrast  08/16/2011  *RADIOLOGY REPORT*  Clinical Data: Unresponsive patient. Headache.  CT  HEAD WITHOUT CONTRAST  Technique:  Contiguous axial images were obtained from the base of the skull through the vertex without contrast.  Comparison: None.  Findings: Study is degraded by motion artifact.  There is diffuse subarachnoid hemorrhage.  This fills the suprasellar cistern. There is also blood tracking along the falx, probably also subarachnoid.  No hydrocephalus.  No midline shift.  No mass lesion or mass effect.  There is no skull fracture identified allowing for motion artifact.  IMPRESSION: Diffuse subarachnoid hemorrhage.  Study degraded by motion artifact. Critical Value/emergent results were called by telephone at the time of interpretation on 08/16/2011 at 0626 hours to Dr. Colon Branch, who verbally acknowledged these results.  Original Report Authenticated By: Andreas Newport, M.D.    No diagnosis found. 1610 Spoke with husband He states patient got up to the bathroom. Started to "holler" that her head was hurting. Became unconscious. He called EMS. Turned her on her side. EMS arrived and transported. 9604 Spoke with Dr. Carlota Raspberry, radiologist who advised patient has a large subarachnoid bleed.  6:39 AM:  T/C to Dr. Wynetta Emery, neurosurgeon case discussed, including:  HPI, pertinent PM/SHx, VS/PE, dx testing, He has reviewed the films. He does not feel there is any intervention that can be initiated at this time. Comfort measures only. 5409 Spoke with husband and patient's niece and sister. Advised them of findings and prognosis.Escorted them to the bedside.  8119  T/C to Dr. Orvan Falconer, case discussed, including:  HPI, pertinent PM/SHx, VS/PE, dx testing, ED course and treatment.  Hospitalist to see patient and family in the ER. Will offer palliative care.   tPA in stroke considered, but not given due to the following:  Onset over 3-4.5 hours Symptoms resolved Rapid improvement / severity too mild Pt/family refused Unable to  determine eligibility Unable to diagnose within 3 hours CT  findings Severe co-morbid illness / Life expectancy <1 year Palliative / Comfort care No IV access  Contraindication (one or more apply): SBP>185 or DBP>110, seizure at onset, recent surgery/trauma <15 days, recent intracranial or spinal surgery, head trauma or stroke <3 months, history of intracranial hemorrhage, brain aneurysm, vascular malformation, brain tumor, active internal bleeding <22 days, Plts <100K, PTT>40seconds after heparin use, PT>15 or INR>1.7, unknown bleeding diathesis, suspicion of SAH   Warning (one or more apply): NIHSS>22, glucose <50 or >400, left heart thrombus, increased risk of bleeding due to pericarditis, SBE, hemostatic defect including those due to severe renal or hepatic disease, pregnancy, DM retinopathy or other eye bleeding, septic thrombophlebitis or occluded AV cannula at infected site, currently receiving oral anticoagulants      MDM  Patient arrived unresponsive after collapse at home following c/o severe headache. Patient has remained unresponsive. Consulted with radiology. Neurosurgery, and the hospitalist. Family is at the bedside. [name] is at the bedside.The patient appears reasonably stabilized for admission considering the current resources, flow, and capabilities available in the ED at this time, and I doubt any other [facility] requiring further screening and/or treatment in the ED prior to admission.  CRITICAL CARE Performed by: Annamarie Dawley  MDM Reviewed: nursing note, vitals and previous chart Interpretation: CT scan and labs Consults: neurosurgery (radiology, hospitalist)    Total critical care time:60 Critical care time was exclusive of separately billable procedures and treating other patients. Critical care was necessary to treat or prevent imminent or life-threatening deterioration. Critical care was time spent personally by me on the following activities: development of treatment plan with patient and/or surrogate as well as nursing,  discussions with consultants, evaluation of patient's response to treatment, examination of patient, obtaining history from patient or surrogate, ordering and performing treatments and interventions, ordering and review of laboratory studies, ordering and review of radiographic studies, pulse oximetry and re-evaluation of patient's condition.        Nicoletta Dress. Colon Branch, MD 08/16/11 0753  This note was amended on 12/05/2011 to reflect the complete exam performed at the time of presentation. The intubation was not documented and the neuro exam was deleted. Note the amendments.  Nicoletta Dress. Colon Branch, MD 12/05/11 4098

## 2011-08-16 NOTE — Progress Notes (Signed)
Brief Nutrition Note  Chart reviewed. Pt now transitioning to comfort care.  No further nutrition interventions warranted at this time.  Please re-consult as needed.   Addam Goeller A. Kayan, RD, LDN Pager: [phone]  

## 2011-08-17 MED ORDER — NIMODIPINE 30 MG/ML ORAL SOLUTION
60.0000 mg | ORAL | Status: DC
  Administered 2011-08-17 – 2011-08-18 (×3): 60 mg via ORAL
  Filled 2011-08-17 (×11): qty 2

## 2011-08-17 MED ORDER — MORPHINE SULFATE (CONCENTRATE) 20 MG/ML PO SOLN: 10.0000 mg | ORAL | Status: DC | PRN

## 2011-08-17 NOTE — Clinical Social Work Psychosocial (Signed)
Clinical Social Work Department BRIEF PSYCHOSOCIAL ASSESSMENT 08/17/2011  Patient:  [facility]     Account Number:  [id number]     Admit date:  08/16/2011  Clinical Social Worker:  Nancie Neas  Date/Time:  08/17/2011 03:50 PM  Referred by:  Physician  Date Referred:  08/17/2011 Referred for  Residential hospice placement   Other Referral:   Interview type:  Family Other interview type:   husband- Melvin    PSYCHOSOCIAL DATA Living Status:  FAMILY Admitted from facility:   Level of care:   Primary support name:  Alinda Money Primary support relationship to patient:  SPOUSE Degree of support available:   supportive    CURRENT CONCERNS Current Concerns  Post-Acute Placement   Other Concerns:    SOCIAL WORK ASSESSMENT / PLAN CSW met with pt's husband this morning to discuss hospice referral. Pt was not responding at this time. Husband reports pt was at home and EMS was called as she became unresponsive while screaming about her head hurting. Pt on vent initially but was extubated as approach became comfort care. Family present and were open to hospice consult. CSW provided support to pt's husband and end of life booklet. Hospice evaluated pt this morning. Pt opened her eyes and family began to discuss rehab. PT consulted and pt said her name and followed some commands. Per staff, she is now talking some. CSW discussed with MD and will reassess situation in AM.   Assessment/plan status:  Psychosocial Support/Ongoing Assessment of Needs Other assessment/ plan:   Information/referral to community resources:   End of Life booklet    PATIENT'S/FAMILY'S RESPONSE TO PLAN OF CARE: Pt's family originally were open to hospice consult but are now encouraged by pt's response. CSW will follow up in AM for goals and d/c planning.  Support offered.        Derenda Fennel, [place] [phone]

## 2011-08-17 NOTE — Discharge Summary (Deleted)
Physician Discharge Summary  [name]:[id number] DOB: 1950-09-20 DOA: 08/16/2011  PCP: No primary provider on file.  Admit date: 08/16/2011 Discharge date: 08/17/2011  Discharge Diagnoses:  Principal Problem:  *Subarachnoid hemorrhage Active Problems:  Encephalopathy  Aspiration pneumonia  Acute respiratory failure  HTN (hypertension)  Discharge Condition: stable for discharge  Disposition:   Diet:comfort feeds Wt Readings from Last 3 Encounters:  08/17/11 102.4 kg (225 lb 12 oz)  07/08/11 100.897 kg (222 lb 7 oz)    History of present illness:  61 year old [place] American female with history of hypertension who was at home in her usual state of health. Her husband reports that around 4 AM the patient was going to the bathroom and complained of a severe headache. She is screaming in pain due to the headache. Her husband went to call 911, and when he returned to his wife she was unresponsive. He had noted some frothy secretions coming from her mouth. She was brought to the emergency room for evaluation where CT of her head showed diffuse subarachnoid hemorrhage. Chest x-ray was revealed likely aspiration pneumonia. Patient was intubated in the emergency room and is unable to provide any history.  Hospital Course:  Principal Problem:  *Subarachnoid hemorrhage: -A CT scan was done in the emergency room this was discussed with the neurosurgeon on call Kram who recommended no further intervention. This was communicated to the family, they had a difficult time coming to terms with the patient's sudden decline in mental function. After speaking with them and consulting hospice care they did agree to move to comfort care and elected to extubate the patient on 08/16/2011.  Encephalopathy -secondary to #1. It does not appear that she can protect her airway  Aspiration pneumonia: Decision was made to withdraw care and moved towards comfort care. Antibiotics were stopped.  Acute  respiratory failure -Initially on admission she was intubated she could not protect her airways after discussing the poor prognosis with her family, he decided to move towards comfort care.    Discharge Exam: Filed Vitals:   08/17/11 0800  BP:   Pulse:   Temp: 98.8 F (37.1 C)  Resp:    Filed Vitals:   08/17/11 0517 08/17/11 0600 08/17/11 0737 08/17/11 0800  BP:      Pulse:  63 58   Temp:    98.8 F (37.1 C)  TempSrc:    Axillary  Resp:  22 24   Height:      Weight: 102.4 kg (225 lb 12 oz)     SpO2:  100% 100%    See progress note  Discharge Instructions  Discharge Orders    Future Orders Please Complete By Expires   Diet - low sodium heart healthy      Increase activity slowly        Medication List  As of 08/17/2011  8:57 AM   STOP taking these medications         methylcellulose 1 % ophthalmic solution         TAKE these medications         cloNIDine 0.1 MG tablet   Commonly known as: CATAPRES   Take 0.1 mg by mouth 2 (two) times daily.      hydrochlorothiazide 25 MG tablet   Commonly known as: HYDRODIURIL   Take 1 tablet (25 mg total) by mouth daily.      ibuprofen 800 MG tablet   Commonly known as: ADVIL,MOTRIN   Take 800 mg by mouth  every 8 (eight) hours as needed. Pain      morphine 20 MG/ML concentrated solution   Commonly known as: ROXANOL   Take 0.5-1 mLs (10-20 mg total) by mouth every 2 (two) hours as needed for pain.              The results of significant diagnostics from this hospitalization (including imaging, microbiology, ancillary and laboratory) are listed below for reference.    Significant Diagnostic Studies: Ct Head Wo Contrast  08/16/2011  *RADIOLOGY REPORT*  Clinical Data: Unresponsive patient. Headache.  CT HEAD WITHOUT CONTRAST  Technique:  Contiguous axial images were obtained from the base of the skull through the vertex without contrast.  Comparison: None.  Findings: Study is degraded by motion artifact.  There is  diffuse subarachnoid hemorrhage.  This fills the suprasellar cistern. There is also blood tracking along the falx, probably also subarachnoid.  No hydrocephalus.  No midline shift.  No mass lesion or mass effect.  There is no skull fracture identified allowing for motion artifact.  IMPRESSION: Diffuse subarachnoid hemorrhage.  Study degraded by motion artifact. Critical Value/emergent results were called by telephone at the time of interpretation on 08/16/2011 at 0626 hours to Dr. Colon Branch, who verbally acknowledged these results.  Original Report Authenticated By: Andreas Newport, M.D.   Dg Chest Portable 1 View  08/16/2011  *RADIOLOGY REPORT*  Clinical Data: Loss of consciousness.  PORTABLE CHEST - 1 VIEW  Comparison: Chest x-ray 12/27/2006.  Findings: An endotracheal tube is in place with tip 1.5 cm above the carina. A nasogastric tube is seen extending into the stomach, however, the tip of the nasogastric tube extends below the lower margin of the image.  Lung volumes are low.  Bibasilar opacities (left greater than right) may represent areas of atelectasis and/or consolidation.  In addition, there are patchy interstitial and airspace foci scattered throughout the lungs bilaterally, most pronounced throughout the right mid and upper lung, as well as the left suprahilar region.  Mild blunting of the left costophrenic sulcus may represent small left pleural effusion.  Pulmonary venous congestion without frank pulmonary edema.  Heart size is within normal limits. The patient is rotated to the left on today's exam, resulting in distortion of the mediastinal contours and reduced diagnostic sensitivity and specificity for mediastinal pathology.  IMPRESSION: 1.  Support apparatus, as above.  The state node of the low-lying endotracheal tube and consider withdrawal approximately 3 cm for more optimal placement. 2.  Patchy interstitial and airspace disease scattered throughout the lungs bilaterally, with an asymmetric  distribution.  Findings are concerning for multilobar pneumonia or sequela of aspiration. 3.  Bibasilar subsegmental atelectasis. 4.  Possible small left pleural effusion.  Original Report Authenticated By: Florencia Reasons, M.D.    Microbiology: Recent Results (from the past 240 hour(s))  MRSA PCR SCREENING     Status: Normal   Collection Time   08/16/11 11:01 AM      Component Value Range Status Comment   MRSA by PCR NEGATIVE  NEGATIVE Final      Labs: Basic Metabolic Panel:  Lab 08/16/11 4098  NA 138  K 3.1*  CL 100  CO2 24  GLUCOSE 263*  BUN 17  CREATININE 0.97  CALCIUM 9.0  MG --  PHOS --   Liver Function Tests:  Lab 08/16/11 0630  AST 45*  ALT 27  ALKPHOS 82  BILITOT 0.5  PROT 7.2  ALBUMIN 3.3*   No results found for this basename: LIPASE:5,AMYLASE:5 in  the last 168 hours No results found for this basename: AMMONIA:5 in the last 168 hours CBC:  Lab 08/16/11 0630  WBC 12.4*  NEUTROABS 7.8*  HGB 12.4  HCT 38.4  MCV 82.1  PLT 303   Cardiac Enzymes:  Lab 08/16/11 0630  CKTOTAL 428*  CKMB 6.4*  CKMBINDEX --  TROPONINI <0.30   BNP: No components found with this basename: POCBNP:5 CBG: No results found for this basename: GLUCAP:5 in the last 168 hours  Time coordinating discharge: greater than 40 minutes  Signed:  Marinda Elk  [facility] 08/17/2011, 8:57 AM

## 2011-08-17 NOTE — Evaluation (Signed)
Physical Therapy Evaluation Patient Details Name: [name] MRN: [id number] DOB: 1950/03/17 Today's Date: 08/17/2011 Time: 4098-1191 PT Time Calculation (min): 36 min  PT Assessment / Plan / Recommendation Clinical Impression  Pt was seen for eval.  Pt was very lethargic but was able to momentarily open her eyes to command.  She was able to squeeze both of my hands with  a weak grip, open her mouth slightly, move her LUE against gravity and flex her L hip.  She was able to initiate a "bridge" when placed in the correct position but was not able to participate in any rolling.  Pt was able to tell me her 1st name and did respond "yes" a couple of times.  She also nodded her head appropriately. From a pruely rehab point of view, this pt has function with which we could work to facilitate.               PT Assessment  Patient needs continued PT services (see assessment)    Follow Up Recommendations  Other (comment);Skilled nursing facility (see assessment)    Barriers to Discharge None      Equipment Recommendations  Defer to next venue    Recommendations for Other Services     Frequency Min 5X/week    Precautions / Restrictions Precautions Precautions: Fall Restrictions Weight Bearing Restrictions: No   Pertinent Vitals/Pain       Mobility  Bed Mobility Details for Bed Mobility Assistance: pt is able to initiate a "bridge" when placed in the correct position but was not able to participate in any rolling pattern of motion Transfers Transfers: Not assessed Ambulation/Gait Ambulation/Gait Assistance: Not tested (comment)    Exercises     PT Diagnosis: Hemiplegia dominant side;Altered mental status  PT Problem List: Decreased strength;Decreased activity tolerance;Decreased mobility;Decreased knowledge of use of DME;Decreased safety awareness;Cardiopulmonary status limiting activity (hypertensive) PT Treatment Interventions: Neuromuscular re-education;Patient/family  education   PT Goals Acute Rehab PT Goals PT Goal Formulation:  (pt has been discharged)  Visit Information  Last PT Received On: 08/17/11    Subjective Data  Subjective: family reports that pt has been able to bring her L hand up to touch the top of her head Patient Stated Goal: pt not able to verbalize   Prior Functioning  Home Living Lives With: Spouse Available Help at Discharge: Family Type of Home: House Prior Function Level of Independence: Independent Able to Take Stairs?: Yes Driving: Yes Vocation: Full time employment Comments: worked at [employer] and [employer]: Other (comment) (minimally communicative,but seems to understand)    Cognition  Overall Cognitive Status: Difficult to assess Difficult to assess due to: Level of arousal Arousal/Alertness: Lethargic Orientation Level:  (unable to assess) Behavior During Session: Lethargic Cognition - Other Comments: keeps eyes closed most of the time, occasionally opens her eyes but unable to keep them open...she was able to tell me her name and did speak "yes" once or twice...she will nod her head at times to respond to questions    Extremity/Trunk Assessment Right Upper Extremity Assessment RUE ROM/Strength/Tone: Deficits RUE ROM/Strength/Tone Deficits: only active motion noted was finger flexion with a weak grip when asked to squeeze my hand...no increased tone noted, full passive ROM Left Upper Extremity Assessment LUE ROM/Strength/Tone: Deficits LUE ROM/Strength/Tone Deficits: pt is able to move LUE against gravity but cannot take any resistance...movement is within patterns of normal movement but weak and not purposeful Right Lower Extremity Assessment RLE ROM/Strength/Tone: Deficits RLE ROM/Strength/Tone Deficits: no active  muscle motion palpated, full passive ROM of all joints Left Lower Extremity Assessment LLE ROM/Strength/Tone: Deficits LLE ROM/Strength/Tone Deficits: pt is able to  demonstrate 2+/5 strength in L iliopsoas and sartorius  but I could not elicit any other muscle motion LLE Sensation: WFL - Light Touch   Balance Balance Balance Assessed: No  End of Session PT - End of Session Activity Tolerance: Patient limited by fatigue Patient left: in bed;with family/visitor present Nurse Communication: Mobility status  GP     Konrad Penta 08/17/2011, 12:56 PM

## 2011-08-17 NOTE — Consult Note (Signed)
[facility] [name] A. Gerilyn Pilgrim, MD     [email or web address]        The patient is a 61 year old black female who developed this severe onset of headaches approximately 5 AM on the morning of August 13.  The patient quickly became unresponsive with frothing of the mouth.  The patient was taken to the emergency room by EMS where she was noted to have diffuse subarachnoid hemorrhage on CT scan and aspiration pneumonia.  She was subsequently intubated.  Initial neurosurgical consultation over the phone recommended supportive care and the most likely very poor prognosis.  Another phone consultation was also done via phone to the stroke team at [facility].  This was done per request of the family.  The prognosis and recommendations were the same as initial neurosurgical phone consultation.  The patient pulled her status improved rapidly and she was extubated after several hours.  It appears that her neurological function also improved when she opened her eyes spontaneously and follow commands.  Consequently, neurological consultation was sought to help with further management and direction.  GENERAL: The patient is extubated.  HEENT: She does have some nuchal rigidity.  ABDOMEN: soft  EXTREMITIES: No edema   BACK: Normal.  SKIN: Normal by inspection.    MENTAL STATUS: She lays in bed with eyes closed.  She opens eyes to verbal command.  The patient follows axial and appendicular commands briskly.  She states that she is doing fine.  CRANIAL NERVES: Pupils are equal, round and reactive to light and accomodation; she has a significant left exotropia but has full extraocular movements horizontally in both directions, she seems to have no upgaze and limited movements on down gaze; upper and lower facial muscles are normal in strength and symmetric, there is no flattening of the nasolabial folds; tongue is midline; uvula is midline.  MOTOR: She had antigravity strength in the  upper extremities.  She wiggles her toes and she hold up 2 fingers consistently.  COORDINATION: No dysmetria noted.  No tremors.  REFLEXES: Deep tendon reflexes are symmetrical and normal.   SENSATION: She withdraws to deep painful stimuli bilaterally.  ASSESSMENT/PLAN: Diffuse subretinal hemorrhage with moderate to severe neurological deficit.  The patient initially seemed to have had a poor prognosis on presentation about her neurological function has improved significantly.  I estimate her Hunt Hess scale to be 3.  Glasgow Coma Scale 13.  The case was discussed with the neuro interventional radiologist and also neurosurgery tomorrow for possible transfer and additional treatment such as coiling and clipping of aneurysm.  The case was discussed at length with the family members were present today at the bedside.  Nimodipine will be added.

## 2011-08-17 NOTE — Consult Note (Signed)
Reason for Consult: Referring Physician:   [name] is an 60 y.o. female.  HPI:   Past Medical History  Diagnosis Date  . Hypertension     Past Surgical History  Procedure Date  . Abdominal hysterectomy     History reviewed. No pertinent family history.  Social History:  reports that she has never smoked. She does not have any smokeless tobacco history on file. She reports that she does not drink alcohol or use illicit drugs.  Allergies:  Allergies  Allergen Reactions  . Lactose Intolerance (Gi) Other (See Comments)    G.I. Upset    Medications:  Prior to Admission medications   Medication Sig Start Date End Date Taking? Authorizing Provider  cloNIDine (CATAPRES) 0.1 MG tablet Take 0.1 mg by mouth 2 (two) times daily.   Yes Historical Provider, MD  hydrochlorothiazide (HYDRODIURIL) 25 MG tablet Take 1 tablet (25 mg total) by mouth daily. 07/08/11 07/07/12 Yes Tammy L. Triplett, PA  ibuprofen (ADVIL,MOTRIN) 800 MG tablet Take 800 mg by mouth every 8 (eight) hours as needed. Pain   Yes Historical Provider, MD  morphine (ROXANOL) 20 MG/ML concentrated solution Take 0.5-1 mLs (10-20 mg total) by mouth every 2 (two) hours as needed for pain. 08/17/11   Marinda Elk, MD   Scheduled Meds:   . antiseptic oral rinse  15 mL Mouth Rinse QID  . chlorhexidine  15 mL Mouth Rinse BID   Continuous Infusions:  PRN Meds:.sodium chloride, albuterol, ipratropium, LORazepam, morphine injection   Results for orders placed during the hospital encounter of 08/16/11 (from the past 48 hour(s))  CBC WITH DIFFERENTIAL     Status: Abnormal   Collection Time   08/16/11  6:30 AM      Component Value Range Comment   WBC 12.4 (*) 4.0 - 10.5 K/uL    RBC 4.68  3.87 - 5.11 MIL/uL    Hemoglobin 12.4  12.0 - 15.0 g/dL    HCT 16.1  09.6 - 04.5 %    MCV 82.1  78.0 - 100.0 fL    MCH 26.5  26.0 - 34.0 pg    MCHC 32.3  30.0 - 36.0 g/dL    RDW 40.9  81.1 - 91.4 %    Platelets 303  150 - 400  K/uL    Neutrophils Relative 63  43 - 77 %    Neutro Abs 7.8 (*) 1.7 - 7.7 K/uL    Lymphocytes Relative 31  12 - 46 %    Lymphs Abs 3.8  0.7 - 4.0 K/uL    Monocytes Relative 4  3 - 12 %    Monocytes Absolute 0.5  0.1 - 1.0 K/uL    Eosinophils Relative 2  0 - 5 %    Eosinophils Absolute 0.2  0.0 - 0.7 K/uL    Basophils Relative 0  0 - 1 %    Basophils Absolute 0.1  0.0 - 0.1 K/uL   ETHANOL     Status: Normal   Collection Time   08/16/11  6:30 AM      Component Value Range Comment   Alcohol, Ethyl (B) <11  0 - 11 mg/dL   COMPREHENSIVE METABOLIC PANEL     Status: Abnormal   Collection Time   08/16/11  6:30 AM      Component Value Range Comment   Sodium 138  135 - 145 mEq/L    Potassium 3.1 (*) 3.5 - 5.1 mEq/L    Chloride 100  96 -  112 mEq/L    CO2 24  19 - 32 mEq/L    Glucose, Bld 263 (*) 70 - 99 mg/dL    BUN 17  6 - 23 mg/dL    Creatinine, Ser 2.72  0.50 - 1.10 mg/dL    Calcium 9.0  8.4 - 53.6 mg/dL    Total Protein 7.2  6.0 - 8.3 g/dL    Albumin 3.3 (*) 3.5 - 5.2 g/dL    AST 45 (*) 0 - 37 U/L    ALT 27  0 - 35 U/L    Alkaline Phosphatase 82  39 - 117 U/L    Total Bilirubin 0.5  0.3 - 1.2 mg/dL    GFR calc non Af Amer 61 (*) >90 mL/min    GFR calc Af Amer 71 (*) >90 mL/min   TROPONIN I     Status: Normal   Collection Time   08/16/11  6:30 AM      Component Value Range Comment   Troponin I <0.30  <0.30 ng/mL   CK TOTAL AND CKMB     Status: Abnormal   Collection Time   08/16/11  6:30 AM      Component Value Range Comment   Total CK 428 (*) 7 - 177 U/L    CK, MB 6.4 (*) 0.3 - 4.0 ng/mL    Relative Index 1.5  0.0 - 2.5   URINE RAPID DRUG SCREEN (HOSP PERFORMED)     Status: Normal   Collection Time   08/16/11  7:08 AM      Component Value Range Comment   Opiates NONE DETECTED  NONE DETECTED    Cocaine NONE DETECTED  NONE DETECTED    Benzodiazepines NONE DETECTED  NONE DETECTED    Amphetamines NONE DETECTED  NONE DETECTED    Tetrahydrocannabinol NONE DETECTED  NONE DETECTED     Barbiturates NONE DETECTED  NONE DETECTED   BLOOD GAS, ARTERIAL     Status: Abnormal   Collection Time   08/16/11  7:58 AM      Component Value Range Comment   FIO2 50.00      Delivery systems VENTILATOR      Mode PRESSURE REGULATED VOLUME CONTROL      VT 600      Rate 14      pH, Arterial 7.397  7.350 - 7.450    pCO2 arterial 40.0  35.0 - 45.0 mmHg    pO2, Arterial 93.7  80.0 - 100.0 mmHg    Bicarbonate 24.1 (*) 20.0 - 24.0 mEq/L    TCO2 21.4  0 - 100 mmol/L    Acid-base deficit 0.1  0.0 - 2.0 mmol/L    O2 Saturation 96.5      Patient temperature 37.0      Collection site RIGHT RADIAL      Drawn by COLLECTED BY RT      Sample type ARTERIAL      Allens test (pass/fail) PASS  PASS   MRSA PCR SCREENING     Status: Normal   Collection Time   08/16/11 11:01 AM      Component Value Range Comment   MRSA by PCR NEGATIVE  NEGATIVE     Ct Head Wo Contrast  08/16/2011  *RADIOLOGY REPORT*  Clinical Data: Unresponsive patient. Headache.  CT HEAD WITHOUT CONTRAST  Technique:  Contiguous axial images were obtained from the base of the skull through the vertex without contrast.  Comparison: None.  Findings: Study is degraded by motion artifact.  There is diffuse subarachnoid hemorrhage.  This fills the suprasellar cistern. There is also blood tracking along the falx, probably also subarachnoid.  No hydrocephalus.  No midline shift.  No mass lesion or mass effect.  There is no skull fracture identified allowing for motion artifact.  IMPRESSION: Diffuse subarachnoid hemorrhage.  Study degraded by motion artifact. Critical Value/emergent results were called by telephone at the time of interpretation on 08/16/2011 at 0626 hours to Dr. Colon Branch, who verbally acknowledged these results.  Original Report Authenticated By: Andreas Newport, M.D.   Dg Chest Portable 1 View  08/16/2011  *RADIOLOGY REPORT*  Clinical Data: Loss of consciousness.  PORTABLE CHEST - 1 VIEW  Comparison: Chest x-ray 12/27/2006.   Findings: An endotracheal tube is in place with tip 1.5 cm above the carina. A nasogastric tube is seen extending into the stomach, however, the tip of the nasogastric tube extends below the lower margin of the image.  Lung volumes are low.  Bibasilar opacities (left greater than right) may represent areas of atelectasis and/or consolidation.  In addition, there are patchy interstitial and airspace foci scattered throughout the lungs bilaterally, most pronounced throughout the right mid and upper lung, as well as the left suprahilar region.  Mild blunting of the left costophrenic sulcus may represent small left pleural effusion.  Pulmonary venous congestion without frank pulmonary edema.  Heart size is within normal limits. The patient is rotated to the left on today's exam, resulting in distortion of the mediastinal contours and reduced diagnostic sensitivity and specificity for mediastinal pathology.  IMPRESSION: 1.  Support apparatus, as above.  The state node of the low-lying endotracheal tube and consider withdrawal approximately 3 cm for more optimal placement. 2.  Patchy interstitial and airspace disease scattered throughout the lungs bilaterally, with an asymmetric distribution.  Findings are concerning for multilobar pneumonia or sequela of aspiration. 3.  Bibasilar subsegmental atelectasis. 4.  Possible small left pleural effusion.  Original Report Authenticated By: Florencia Reasons, M.D.    Review of Systems  Unable to perform ROS: mental status change   Blood pressure 167/78, pulse 52, temperature 98.9 F (37.2 C), temperature source Axillary, resp. rate 21, height 5\' 9"  (1.753 m), weight 102.4 kg (225 lb 12 oz), SpO2 100.00%. Physical Exam  Assessment/Plan: See dict  Lennis Rader 08/17/2011, 8:34 PM

## 2011-08-18 ENCOUNTER — Inpatient Hospital Stay (HOSPITAL_COMMUNITY): Payer: Medicaid Other

## 2011-08-18 ENCOUNTER — Encounter (HOSPITAL_COMMUNITY): Payer: Self-pay | Admitting: Radiology

## 2011-08-18 DIAGNOSIS — G9340 Encephalopathy, unspecified: Secondary | ICD-10-CM

## 2011-08-18 DIAGNOSIS — J690 Pneumonitis due to inhalation of food and vomit: Secondary | ICD-10-CM

## 2011-08-18 DIAGNOSIS — I10 Essential (primary) hypertension: Secondary | ICD-10-CM

## 2011-08-18 DIAGNOSIS — I609 Nontraumatic subarachnoid hemorrhage, unspecified: Principal | ICD-10-CM

## 2011-08-18 LAB — COMPREHENSIVE METABOLIC PANEL
ALT: 16 U/L (ref 0–35)
AST: 28 U/L (ref 0–37)
Alkaline Phosphatase: 75 U/L (ref 39–117)
Calcium: 9.3 mg/dL (ref 8.4–10.5)
Potassium: 3.2 mEq/L — ABNORMAL LOW (ref 3.5–5.1)
Sodium: 139 mEq/L (ref 135–145)
Total Protein: 7.9 g/dL (ref 6.0–8.3)

## 2011-08-18 LAB — CBC WITH DIFFERENTIAL/PLATELET
Basophils Absolute: 0 10*3/uL (ref 0.0–0.1)
Eosinophils Absolute: 0 10*3/uL (ref 0.0–0.7)
Eosinophils Relative: 0 % (ref 0–5)
Lymphocytes Relative: 6 % — ABNORMAL LOW (ref 12–46)
Lymphs Abs: 0.8 10*3/uL (ref 0.7–4.0)
MCH: 26.5 pg (ref 26.0–34.0)
MCV: 80.6 fL (ref 78.0–100.0)
Neutrophils Relative %: 90 % — ABNORMAL HIGH (ref 43–77)
Platelets: 269 10*3/uL (ref 150–400)
RBC: 5.09 MIL/uL (ref 3.87–5.11)
RDW: 15.1 % (ref 11.5–15.5)
WBC: 13.5 10*3/uL — ABNORMAL HIGH (ref 4.0–10.5)

## 2011-08-18 LAB — TYPE AND SCREEN
ABO/RH(D): B POS
Antibody Screen: NEGATIVE

## 2011-08-18 LAB — PROCALCITONIN: Procalcitonin: 0.35 ng/mL

## 2011-08-18 LAB — ABO/RH: ABO/RH(D): B POS

## 2011-08-18 LAB — APTT: aPTT: 32 seconds (ref 24–37)

## 2011-08-18 MED ORDER — NIMODIPINE 30 MG PO CAPS
60.0000 mg | ORAL_CAPSULE | ORAL | Status: DC
  Administered 2011-08-21 – 2011-08-23 (×2): 60 mg via ORAL
  Filled 2011-08-18 (×34): qty 2

## 2011-08-18 MED ORDER — LABETALOL HCL 5 MG/ML IV SOLN
10.0000 mg | INTRAVENOUS | Status: DC | PRN
  Administered 2011-08-18 (×3): 20 mg via INTRAVENOUS
  Filled 2011-08-18 (×3): qty 4

## 2011-08-18 MED ORDER — SODIUM CHLORIDE 0.9 % IJ SOLN
INTRAMUSCULAR | Status: AC
  Filled 2011-08-18: qty 3

## 2011-08-18 MED ORDER — MIDAZOLAM HCL 2 MG/2ML IJ SOLN
2.0000 mg | Freq: Once | INTRAMUSCULAR | Status: AC
  Administered 2011-08-18: 2 mg via INTRAVENOUS

## 2011-08-18 MED ORDER — FENTANYL CITRATE 0.05 MG/ML IJ SOLN
INTRAMUSCULAR | Status: AC
  Administered 2011-08-18: 100 ug via INTRAVENOUS
  Filled 2011-08-18: qty 10

## 2011-08-18 MED ORDER — PANTOPRAZOLE SODIUM 40 MG IV SOLR
40.0000 mg | Freq: Every day | INTRAVENOUS | Status: DC
  Administered 2011-08-18 – 2011-08-22 (×5): 40 mg via INTRAVENOUS
  Filled 2011-08-18 (×6): qty 40

## 2011-08-18 MED ORDER — LABETALOL HCL 5 MG/ML IV SOLN
10.0000 mg | INTRAVENOUS | Status: DC | PRN
  Administered 2011-08-19 (×5): 20 mg via INTRAVENOUS
  Administered 2011-08-21: 40 mg via INTRAVENOUS
  Administered 2011-08-21: 20 mg via INTRAVENOUS
  Filled 2011-08-18 (×2): qty 4
  Filled 2011-08-18: qty 8
  Filled 2011-08-18 (×2): qty 4
  Filled 2011-08-18: qty 8
  Filled 2011-08-18: qty 4

## 2011-08-18 MED ORDER — LABETALOL HCL 5 MG/ML IV SOLN
INTRAVENOUS | Status: AC
  Filled 2011-08-18: qty 4

## 2011-08-18 MED ORDER — ACETAMINOPHEN 325 MG PO TABS: 650.0000 mg | ORAL_TABLET | ORAL | Status: DC | PRN

## 2011-08-18 MED ORDER — SENNOSIDES-DOCUSATE SODIUM 8.6-50 MG PO TABS
1.0000 | ORAL_TABLET | Freq: Two times a day (BID) | ORAL | Status: DC
  Administered 2011-08-19: 1 via ORAL
  Filled 2011-08-18 (×5): qty 1

## 2011-08-18 MED ORDER — MORPHINE SULFATE 2 MG/ML IJ SOLN
1.0000 mg | INTRAMUSCULAR | Status: DC | PRN
  Administered 2011-08-19 (×2): 2 mg via INTRAVENOUS
  Filled 2011-08-18 (×2): qty 1

## 2011-08-18 MED ORDER — IOHEXOL 350 MG/ML SOLN
80.0000 mL | Freq: Once | INTRAVENOUS | Status: AC | PRN
  Administered 2011-08-18: 80 mL via INTRAVENOUS

## 2011-08-18 MED ORDER — FENTANYL CITRATE 0.05 MG/ML IJ SOLN
100.0000 ug | Freq: Once | INTRAMUSCULAR | Status: AC
  Administered 2011-08-18: 100 ug via INTRAVENOUS

## 2011-08-18 MED ORDER — ACETAMINOPHEN-CODEINE #3 300-30 MG PO TABS: 1.0000 | ORAL_TABLET | ORAL | Status: DC | PRN

## 2011-08-18 MED ORDER — NIMODIPINE 30 MG/ML ORAL SOLUTION
60.0000 mg | ORAL | Status: DC
  Administered 2011-08-18 – 2011-08-23 (×29): 60 mg
  Filled 2011-08-18 (×37): qty 2

## 2011-08-18 MED ORDER — ONDANSETRON HCL 4 MG/2ML IJ SOLN: 4.0000 mg | Freq: Four times a day (QID) | INTRAMUSCULAR | Status: DC | PRN

## 2011-08-18 MED ORDER — MIDAZOLAM HCL 2 MG/2ML IJ SOLN
INTRAMUSCULAR | Status: AC
  Administered 2011-08-18: 2 mg via INTRAVENOUS
  Filled 2011-08-18: qty 10

## 2011-08-18 MED ORDER — NIMODIPINE 30 MG PO CAPS
60.0000 mg | ORAL_CAPSULE | ORAL | Status: DC
  Administered 2011-08-18 (×2): 60 mg via ORAL
  Filled 2011-08-18 (×16): qty 2

## 2011-08-18 MED ORDER — HYDRALAZINE HCL 20 MG/ML IJ SOLN
INTRAMUSCULAR | Status: AC
  Filled 2011-08-18: qty 1

## 2011-08-18 MED ORDER — ACETAMINOPHEN 650 MG RE SUPP
650.0000 mg | RECTAL | Status: DC | PRN
  Administered 2011-08-19: 650 mg via RECTAL
  Filled 2011-08-18: qty 1

## 2011-08-18 MED ORDER — PANTOPRAZOLE SODIUM 40 MG IV SOLR
40.0000 mg | INTRAVENOUS | Status: DC
  Administered 2011-08-18: 40 mg via INTRAVENOUS
  Filled 2011-08-18: qty 40

## 2011-08-18 MED ORDER — SODIUM CHLORIDE 0.9 % IV SOLN
500.0000 mg | Freq: Two times a day (BID) | INTRAVENOUS | Status: DC
  Administered 2011-08-18 – 2011-08-23 (×10): 500 mg via INTRAVENOUS
  Filled 2011-08-18 (×11): qty 5

## 2011-08-18 MED ORDER — CEFAZOLIN SODIUM 1-5 GM-% IV SOLN
1.0000 g | Freq: Three times a day (TID) | INTRAVENOUS | Status: DC
  Administered 2011-08-18 – 2011-08-21 (×8): 1 g via INTRAVENOUS
  Filled 2011-08-18 (×12): qty 50

## 2011-08-18 MED ORDER — SODIUM CHLORIDE 0.9 % IV SOLN
1.5000 g | Freq: Four times a day (QID) | INTRAVENOUS | Status: DC
  Administered 2011-08-18 (×2): 1.5 g via INTRAVENOUS
  Filled 2011-08-18 (×7): qty 1.5

## 2011-08-18 MED ORDER — SODIUM CHLORIDE 0.9 % IV SOLN
INTRAVENOUS | Status: DC
  Administered 2011-08-18 – 2011-08-19 (×2): via INTRAVENOUS

## 2011-08-18 MED ORDER — HYDRALAZINE HCL 20 MG/ML IJ SOLN
10.0000 mg | INTRAMUSCULAR | Status: DC | PRN
  Administered 2011-08-18 – 2011-08-21 (×3): 20 mg via INTRAVENOUS
  Filled 2011-08-18 (×3): qty 1

## 2011-08-18 NOTE — Consult Note (Signed)
Name: [name] MRN: [id number] DOB: 1950-07-31    LOS: 2  Referring Provider:  Venetia Maxon (nsgy) Reason for Referral:  ICU management   PULMONARY / CRITICAL CARE MEDICINE  The patient is encephalopathic and unable to provide history, which was obtained for available medical records.  HPI:  61 yo with HTN brought to [facility] 8/13 with headache and acute necephalopathy. Intubated.  Admitted for diffuse subarachnoid hemorrhage and aspiration pneumonia.  Due to the extent of the hemorrhage, comfort care was recommended and the patient was extubated for transition to comfort care.   However patient began to show signs of neurologic improvement and family decided to pursue further aggressive care. Transferred to [facility] for possible Neurological intervention.  Past Medical History  Diagnosis Date  . Hypertension    Past Surgical History  Procedure Date  . Abdominal hysterectomy    Prior to Admission medications   Medication Sig Start Date End Date Taking? Authorizing Provider  cloNIDine (CATAPRES) 0.1 MG tablet Take 0.1 mg by mouth 2 (two) times daily.   Yes Historical Provider, MD  hydrochlorothiazide (HYDRODIURIL) 25 MG tablet Take 1 tablet (25 mg total) by mouth daily. 07/08/11 07/07/12 Yes Tammy L. Triplett, PA  ibuprofen (ADVIL,MOTRIN) 800 MG tablet Take 800 mg by mouth every 8 (eight) hours as needed. Pain   Yes Historical Provider, MD  morphine (ROXANOL) 20 MG/ML concentrated solution Take 0.5-1 mLs (10-20 mg total) by mouth every 2 (two) hours as needed for pain. 08/17/11   Marinda Elk, MD   Allergies Allergies  Allergen Reactions  . Lactose Intolerance (Gi) Other (See Comments)    G.IDorena Dew   Family History History reviewed. No pertinent family history.  Social History  reports that she has never smoked. She does not have any smokeless tobacco history on file. She reports that she does not drink alcohol or use illicit drugs.  Review Of Systems:  Unable to provide  Vital  Signs: Temp:  [98.2 F (36.8 C)-99.4 F (37.4 C)] 98.9 F (37.2 C) (08/15 0800) Pulse Rate:  [52-73] 57  (08/15 1100) Resp:  [19-26] 26  (08/15 1100) BP: (100-193)/(41-86) 187/86 mmHg (08/15 0900) SpO2:  [91 %-100 %] 98 % (08/15 1100) Weight:  [220 lb 0.3 oz (99.8 kg)] 220 lb 0.3 oz (99.8 kg) (08/15 0500)  Physical Examination: General:  Morbidly obese female, NAD Neuro:  Arouses to noxious stimuli, moves R arm, L weak HEENT:  Mm moist, no JVD Cardiovascular:  s1s2 rrr Lungs:  resps even non labored on [place], diminished bases otherwise clear  Abdomen:  Soft, obese, +bs Ext: no edema   Principal Problem:  *Subarachnoid hemorrhage Active Problems:  Encephalopathy  Aspiration pneumonia  Acute respiratory failure  HTN (hypertension)  ASSESSMENT AND PLAN  PULMONARY  Lab 08/16/11 0758  PHART 7.397  PCO2ART 40.0  PO2ART 93.7  HCO3 24.1*  O2SAT 96.5   Ventilator Settings:   CXR:  8/13 >>> Bilateral airspace disease ETT:   8/13>>8/14  A:  Acute respiratory failure secondary to inability to protect airway - resolved. Aspiration pneumonia vs pneumonitis. P:   Goal SpO2>92 Supplemental oxygen IS  CARDIOVASCULAR  Lab 08/16/11 0630  TROPONINI <0.30  LATICACIDVEN --  PROBNP --   Lines:  NA  A: HTN P:  Per Neurology / Neurosurgery  RENAL  Lab 08/16/11 0630  NA 138  K 3.1*  CL 100  CO2 24  BUN 17  CREATININE 0.97  CALCIUM 9.0  MG --  PHOS --  Foley:  8/13>>>  A:  Hypokalemia .  Normal renal function. P:   Replete K PRN Trend BMP  GASTROINTESTINAL  Lab 08/16/11 0630  AST 45*  ALT 27  ALKPHOS 82  BILITOT 0.5  PROT 7.2  ALBUMIN 3.3*   A:  No acute issues. P:   Diet  HEMATOLOGIC  Lab 08/16/11 0630  HGB 12.4  HCT 38.4  PLT 303  INR --  APTT --   A:  No acute issue. P:  Trend CBC  INFECTIOUS  Lab 08/16/11 0630  WBC 12.4*  PROCALCITON --   Cultures: None  Antibiotics: Unasyn 8/13 >>>  A:  Suspected aspiration  pneumonia. P:   Antibiotics and cultures as above  ENDOCRINE No results found for this basename: GLUCAP:5 in the last 168 hours  A: No acute issues. P:   Monitor blood glucose   NEUROLOGIC  A:  Subarachnoid hemorrhage.  Acute encephalopathy. P:   Per Neurology / Neurosurgery  BEST PRACTICE / DISPOSITION Level of Care:  ICU Primary Service:  Neurosurgery Consultants:  Neurology, PCCM Code Status:  Full Diet:  Regular DVT Px: SCD's GI Px:  Protonix Skin Integrity:  Intact Social / Family:  Updated at bedside   [facility], NP Pulmonary and Critical Care Medicine [facility] Pager: [phone]  08/18/2011, 1:40 PM  Patient examined.  Records reviewed.  Case discussed with NP.  Assessment and plan edited as above.  Orlean Bradford, M.D., F.C.C.P. Pulmonary and Critical Care Medicine [facility] Cell: [phone] Pager: [phone]

## 2011-08-18 NOTE — Progress Notes (Signed)
Subjective: Interval History:   Objective: Vital signs in last 24 hours: Temp:  [98.2 F (36.8 C)-99.4 F (37.4 C)] 98.9 F (37.2 C) (08/15 0800) Pulse Rate:  [52-73] 58  (08/15 0713) Resp:  [19-25] 19  (08/15 0713) BP: (100-193)/(41-80) 100/41 mmHg (08/15 0713) SpO2:  [91 %-100 %] 98 % (08/15 0713) Weight:  [99.8 kg (220 lb 0.3 oz)] 99.8 kg (220 lb 0.3 oz) (08/15 0500)  Intake/Output from previous day: 08/14 0701 - 08/15 0700 In: 240 [I.V.:240] Out: -  Intake/Output this shift:   Nutritional status: General   Lab Results:  Basename 08/16/11 0630  WBC 12.4*  HGB 12.4  HCT 38.4  PLT 303  NA 138  K 3.1*  CL 100  CO2 24  GLUCOSE 263*  BUN 17  CREATININE 0.97  CALCIUM 9.0  LABA1C --   Lipid Panel No results found for this basename: CHOL,TRIG,HDL,CHOLHDL,VLDL,LDLCALC in the last 72 hours  Studies/Results: No results found.  Medications:  Scheduled Meds:   . ampicillin-sulbactam (UNASYN) IV  1.5 g Intravenous Q6H  . antiseptic oral rinse  15 mL Mouth Rinse QID  . chlorhexidine  15 mL Mouth Rinse BID  . niMODipine  60 mg Oral Q4H  . sodium chloride      . DISCONTD: niMODipine  60 mg Oral Q4H   Continuous Infusions:  PRN Meds:.sodium chloride, albuterol, ipratropium, LORazepam, morphine injection   Assessment/Plan: See dict   LOS: 2 days   [name]

## 2011-08-18 NOTE — Care Management Note (Signed)
    Page 1 of 2   09/09/2011     3:20:04 PM   CARE MANAGEMENT NOTE 09/09/2011  Patient:  [facility]   Account Number:  [id number]  Date Initiated:  08/18/2011  Documentation initiated by:  Rosemary Holms  Subjective/Objective Assessment:   Pt admitted from home with spouse. PTA lived independently. Admitted with subarachnoid hemorrage,vented and then taken off vent. Pt began to respond to stimulus.     Action/Plan:   Pt being transfer to [facility] campus for further evaluation and treatment.   Anticipated DC Date:  09/02/2011   Anticipated DC Plan:  SKILLED NURSING FACILITY  In-house referral  Clinical Social Worker      DC Planning Services  CM consult      Choice offered to / List presented to:             Status of service:  Completed, signed off Medicare Important Message given?   (If response is "NO", the following Medicare IM given date fields will be blank) Date Medicare IM given:   Date Additional Medicare IM given:    Discharge Disposition:  IP REHAB FACILITY  Per UR Regulation:  Reviewed for med. necessity/level of care/duration of stay  If discussed at Long Length of Stay Meetings, dates discussed:   08/23/2011  08/30/2011  09/08/2011    Comments:  09/07/11 Onnie Boer, RN, BSN 1239 PT AWAITING CIR W/U, PT CONTS TO WORK WITH PHYSICAL THERAPY.  08/30/11 Patient extubated 08/27/11,ventricular drain accidentially removed 08/28/11, remains in ICU due to need for close neuro and respiratory monitoring. PT to evaluate. Anticipate SNF vs CIR.  08/23/11 Continues on vent attempting to wean today. Will continue to follow for d/c needs. Jacquelynn Cree RN, BSN, CCM   08/19/11 Admitted to ICU at [facility], had bilateral common carotid angiogram with coiling of aneurysm today, remains intubated post procedure. Will follow for d/c needs. Jacquelynn Cree RN, BSN, CCM  08/18/11 900 Amy Leanord Hawking RN BSN CM

## 2011-08-18 NOTE — Op Note (Signed)
Preop Dx: SAH and Hydrocephaul Postop Dx: Same Procedure: Right frontal IVC placement Surgeon: Venetia Maxon Anesthesia: Lidocaine and IV sedation with Fentanyl and Versed Complications: None  Patient has HCP and SAH with obtundation.  Right frontal scalp was prepped and draped after shaving this region.  Area of planned incision was infiltrated with lidocaine with epinephrine.  A stab incision was created and trephine made. The dura was incised and IVC drain was placed with brisk return of blood tinged CSF under pressure.  The drain was tunneled, anchored and hooked to the Buretrol after incisions were closed with 3-0 Nylon sutures.  The wound was dressed with a sterile occlusive dressing.  The patient appeared to tolerate the procedure well.

## 2011-08-18 NOTE — Progress Notes (Signed)
Aline attempted times 4 with two different RTs. Good blood flow however when catheter is advanced blood flow stops even with manipulation of catheter. RT made RN aware. Rt will continue to monitor.

## 2011-08-18 NOTE — Progress Notes (Signed)
Report called to [place] on 3100. Pt is being transported for further evaluation and treatment under the care of Dr. Elenore Paddy at [facility].  Pt following simple commands and verbalized no to pain question.  No acute distress noted.  Family desires patient to have further evaluation and treatment options available.

## 2011-08-18 NOTE — Progress Notes (Addendum)
[facility] PROGRESS NOTE  [name] ZOX:[id number] DOB: 1950-07-13 DOA: 08/16/2011   Assessment/Plan:  Subarachnoid hemorrhage (08/16/2011) -A CT scan was done in the emergency room this was discussed with the neurosurgeon on call DR. Kram who recommended no further intervention at that time.  This was communicated to the family, they had a difficult time coming to terms with the patient's sudden decline in mental function. -12 hrs after extubation, she started to show sign of improvement and the family decided they wanted to do everything for her. So neurology was consulted.  Encephalopathy (08/16/2011) -secondary to #1.  Aspiration pneumonia (08/16/2011) -Unasyn. -continue the pt NPO.  Acute respiratory failure (08/16/2011) -s/p extubation on 08/16/2011, she has been on O2 maintaining her saturations.  HTN (hypertension) (08/16/2011) -continue to be < 160/90.   Code Status: full code Family Communication: Spouse [phone] Disposition Plan: Transfer to [facility]     LOS: 2 days   Procedures:  Extubated 08/16/2011  Antibiotics:  Unasyn 08/18/2011  Interim History:   Subjective: Patient is lethargic  Objective: Filed Vitals:   08/18/11 0500 08/18/11 0600 08/18/11 0713 08/18/11 0800  BP: 193/75  100/41   Pulse:  59 58   Temp:    98.9 F (37.2 C)  TempSrc:    Axillary  Resp: 23 23 19    Height:      Weight: 99.8 kg (220 lb 0.3 oz)     SpO2:  96% 98%     Intake/Output Summary (Last 24 hours) at 08/18/11 0944 Last data filed at 08/18/11 0600  Gross per 24 hour  Intake    240 ml  Output      0 ml  Net    240 ml   Weight change: -1.806 kg (-3 lb 15.7 oz)  Exam:  General: lethargic,  in no acute distress.  HEENT: No bruits, no goiter.  Heart: Regular rate and rhythm, without murmurs, rubs, gallops.  Lungs: Good air movement, clear to auscultation  Neuro: open her to voice and response to moving her left leg. When told that today is her anniversary  she cries.   Data Reviewed: Basic Metabolic Panel:  Lab 08/16/11 5621  NA 138  K 3.1*  CL 100  CO2 24  GLUCOSE 263*  BUN 17  CREATININE 0.97  CALCIUM 9.0  MG --  PHOS --   Liver Function Tests:  Lab 08/16/11 0630  AST 45*  ALT 27  ALKPHOS 82  BILITOT 0.5  PROT 7.2  ALBUMIN 3.3*   No results found for this basename: LIPASE:5,AMYLASE:5 in the last 168 hours No results found for this basename: AMMONIA:5 in the last 168 hours CBC:  Lab 08/16/11 0630  WBC 12.4*  NEUTROABS 7.8*  HGB 12.4  HCT 38.4  MCV 82.1  PLT 303   Cardiac Enzymes:  Lab 08/16/11 0630  CKTOTAL 428*  CKMB 6.4*  CKMBINDEX --  TROPONINI <0.30   BNP: No components found with this basename: POCBNP:5 CBG: No results found for this basename: GLUCAP:5 in the last 168 hours  Recent Results (from the past 240 hour(s))  MRSA PCR SCREENING     Status: Normal   Collection Time   08/16/11 11:01 AM      Component Value Range Status Comment   MRSA by PCR NEGATIVE  NEGATIVE Final      Studies: Ct Head Wo Contrast  08/16/2011  *RADIOLOGY REPORT*  Clinical Data: Unresponsive patient. Headache.  CT HEAD WITHOUT CONTRAST  Technique:  Contiguous axial images were  obtained from the base of the skull through the vertex without contrast.  Comparison: None.  Findings: Study is degraded by motion artifact.  There is diffuse subarachnoid hemorrhage.  This fills the suprasellar cistern. There is also blood tracking along the falx, probably also subarachnoid.  No hydrocephalus.  No midline shift.  No mass lesion or mass effect.  There is no skull fracture identified allowing for motion artifact.  IMPRESSION: Diffuse subarachnoid hemorrhage.  Study degraded by motion artifact. Critical Value/emergent results were called by telephone at the time of interpretation on 08/16/2011 at 0626 hours to Dr. Colon Branch, who verbally acknowledged these results.  Original Report Authenticated By: Andreas Newport, M.D.   Dg Chest Portable 1  View  08/16/2011  *RADIOLOGY REPORT*  Clinical Data: Loss of consciousness.  PORTABLE CHEST - 1 VIEW  Comparison: Chest x-ray 12/27/2006.  Findings: An endotracheal tube is in place with tip 1.5 cm above the carina. A nasogastric tube is seen extending into the stomach, however, the tip of the nasogastric tube extends below the lower margin of the image.  Lung volumes are low.  Bibasilar opacities (left greater than right) may represent areas of atelectasis and/or consolidation.  In addition, there are patchy interstitial and airspace foci scattered throughout the lungs bilaterally, most pronounced throughout the right mid and upper lung, as well as the left suprahilar region.  Mild blunting of the left costophrenic sulcus may represent small left pleural effusion.  Pulmonary venous congestion without frank pulmonary edema.  Heart size is within normal limits. The patient is rotated to the left on today's exam, resulting in distortion of the mediastinal contours and reduced diagnostic sensitivity and specificity for mediastinal pathology.  IMPRESSION: 1.  Support apparatus, as above.  The state node of the low-lying endotracheal tube and consider withdrawal approximately 3 cm for more optimal placement. 2.  Patchy interstitial and airspace disease scattered throughout the lungs bilaterally, with an asymmetric distribution.  Findings are concerning for multilobar pneumonia or sequela of aspiration. 3.  Bibasilar subsegmental atelectasis. 4.  Possible small left pleural effusion.  Original Report Authenticated By: Florencia Reasons, M.D.    Scheduled Meds:    . ampicillin-sulbactam (UNASYN) IV  1.5 g Intravenous Q6H  . antiseptic oral rinse  15 mL Mouth Rinse QID  . chlorhexidine  15 mL Mouth Rinse BID  . niMODipine  60 mg Oral Q4H  . sodium chloride      . DISCONTD: niMODipine  60 mg Oral Q4H   Continuous Infusions:   Lambert Keto, MD  [facility] Pager [phone]  If 7PM-7AM,  please contact night-coverage [email or web address] Password [facility] 08/18/2011, 9:44 AM

## 2011-08-18 NOTE — H&P (Signed)
The patient is a 61 year old black female who developed this severe onset of headaches approximately 5 AM on the morning of August 13. The patient quickly became unresponsive with frothing of the mouth. The patient was taken to the emergency room by EMS where she was noted to have diffuse subarachnoid hemorrhage on CT scan and aspiration pneumonia. She was subsequently intubated. Initial neurosurgical consultation over the phone recommended supportive care and the most likely very poor prognosis. Another phone consultation was also done via phone to the stroke team at [facility]. This was done per request of the family. The prognosis and recommendations were the same as initial neurosurgical phone consultation. The patient pulled her status improved rapidly and she was extubated after several hours. It appears that her neurological function also improved when she opened her eyes spontaneously and follow commands. Consequently, neurological consultation was sought to help with further management and direction.  GENERAL: The patient is extubated.  HEENT: She does have some nuchal rigidity.  ABDOMEN: soft  EXTREMITIES: No edema  BACK: Normal.  SKIN: Normal by inspection.  MENTAL STATUS: She lays in bed with eyes closed. She opens eyes to verbal command. The patient follows axial and appendicular commands briskly. She states that she is doing fine.  CRANIAL NERVES: Pupils are equal, round and reactive to light and accomodation; she has a significant left exotropia but has full extraocular movements horizontally in both directions, she seems to have no upgaze and limited movements on down gaze; upper and lower facial muscles are normal in strength and symmetric, there is no flattening of the nasolabial folds; tongue is midline; uvula is midline.  MOTOR: She had antigravity strength in the upper extremities. She wiggles her toes and she hold up 2 fingers consistently.  COORDINATION: No dysmetria  noted. No tremors.  REFLEXES: Deep tendon reflexes are symmetrical and normal.  SENSATION: She withdraws to deep painful stimuli bilaterally.  ASSESSMENT/PLAN: Diffuse subretinal hemorrhage with moderate to severe neurological deficit. The patient initially seemed to have had a poor prognosis on presentation about her neurological function has improved significantly. I estimate her Hunt Hess scale to be 3. Glasgow Coma Scale 13. The case was discussed with the neuro interventional radiologist and also neurosurgery tomorrow for possible transfer and additional treatment such as coiling and clipping of aneurysm. The case was discussed at length with the family members were present today at the bedside. Nimodipine will be added.    Per my discussion with Dr. Gerilyn Pilgrim, patient had begun to improve after extubation and it was elected to transfer the patient to [facility] via [facility].  Patient remains obtunded, but will arouse and speak.  She states her name and follows commands, more briskly on the left than the right.  Her pupils are equal, round and reactive.  She has a left exotropia.  SHe remains quite sleepy and without repeated stimulation, she drifts off to sleep.  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  After discussion with Dr. Durenda Age, who feels this can be coiled, I concur with this approach and will ask him to do so.  In light of her obtundation and progressive hydrocephalus, I will place an IVC. Patient's husband has signed consent for this to be done.

## 2011-08-18 NOTE — Progress Notes (Addendum)
[facility] PROGRESS NOTE  [name]:[id number] DOB: 06/03/1950 DOA: 08/16/2011   Assessment/Plan:  Subarachnoid hemorrhage (08/16/2011) -A CT scan was done in the emergency room this was discussed with the neurosurgeon on call DR. Kram who recommended no further intervention at that time.  This was communicated to the family, they had a difficult time coming to terms with the patient's sudden decline in mental function.. After speaking with them and consulting hospice care they did agree to move to comfort care and elected to extubate the patient on 08/16/2011.  -12 hrs after extubation, she started to show sign of improvement and the family decided they wanted to do everything for her. So neurology was consulted they recommended to transfer the patient to [place] after discussing with the neuro interventionalist and neurosurgeon.  Encephalopathy (08/16/2011) -secondary to #1.  Aspiration pneumonia (08/16/2011) -Unasyn. -continue the pt NPO.  Acute respiratory failure (08/16/2011) -s/p extubation on 08/16/2011, she has been on O2 maintaining her saturations.  HTN (hypertension) (08/16/2011) -big fluctuation in Bp, try to keep SBp <180 diastolic < 90. Only systolic elevated ? Emotional. -On nimodipine.  Code Status: full code Family Communication: Spouse [phone] Disposition Plan: Transfer to [facility]     LOS: 2 days   Procedures:  Extubated 08/16/2011  Antibiotics:  Unasyn 08/18/2011  Interim History:   Subjective: Patient is lethargic  Objective: Filed Vitals:   08/18/11 0400 08/18/11 0500 08/18/11 0600 08/18/11 0713  BP: 177/71 193/75  100/41  Pulse:   59 58  Temp: 98.6 F (37 C)     TempSrc: Oral     Resp: 19 23 23 19   Height:      Weight:  99.8 kg (220 lb 0.3 oz)    SpO2:   96% 98%    Intake/Output Summary (Last 24 hours) at 08/18/11 0809 Last data filed at 08/18/11 0600  Gross per 24 hour  Intake    240 ml  Output      0 ml  Net     240 ml   Weight change: -1.806 kg (-3 lb 15.7 oz)  Exam:  General: lethargic,  in no acute distress.  HEENT: No bruits, no goiter.  Heart: Regular rate and rhythm, without murmurs, rubs, gallops.  Lungs: Good air movement, clear to auscultation  Abdomen: Soft, nontender, nondistended, positive bowel sounds.  Neuro: open her to voice and response to moving her left leg.   Data Reviewed: Basic Metabolic Panel:  Lab 08/16/11 5621  NA 138  K 3.1*  CL 100  CO2 24  GLUCOSE 263*  BUN 17  CREATININE 0.97  CALCIUM 9.0  MG --  PHOS --   Liver Function Tests:  Lab 08/16/11 0630  AST 45*  ALT 27  ALKPHOS 82  BILITOT 0.5  PROT 7.2  ALBUMIN 3.3*   No results found for this basename: LIPASE:5,AMYLASE:5 in the last 168 hours No results found for this basename: AMMONIA:5 in the last 168 hours CBC:  Lab 08/16/11 0630  WBC 12.4*  NEUTROABS 7.8*  HGB 12.4  HCT 38.4  MCV 82.1  PLT 303   Cardiac Enzymes:  Lab 08/16/11 0630  CKTOTAL 428*  CKMB 6.4*  CKMBINDEX --  TROPONINI <0.30   BNP: No components found with this basename: POCBNP:5 CBG: No results found for this basename: GLUCAP:5 in the last 168 hours  Recent Results (from the past 240 hour(s))  MRSA PCR SCREENING     Status: Normal   Collection Time   08/16/11  11:01 AM      Component Value Range Status Comment   MRSA by PCR NEGATIVE  NEGATIVE Final      Studies: Ct Head Wo Contrast  08/16/2011  *RADIOLOGY REPORT*  Clinical Data: Unresponsive patient. Headache.  CT HEAD WITHOUT CONTRAST  Technique:  Contiguous axial images were obtained from the base of the skull through the vertex without contrast.  Comparison: None.  Findings: Study is degraded by motion artifact.  There is diffuse subarachnoid hemorrhage.  This fills the suprasellar cistern. There is also blood tracking along the falx, probably also subarachnoid.  No hydrocephalus.  No midline shift.  No mass lesion or mass effect.  There is no skull fracture  identified allowing for motion artifact.  IMPRESSION: Diffuse subarachnoid hemorrhage.  Study degraded by motion artifact. Critical Value/emergent results were called by telephone at the time of interpretation on 08/16/2011 at 0626 hours to Dr. Colon Branch, who verbally acknowledged these results.  Original Report Authenticated By: Andreas Newport, M.D.   Dg Chest Portable 1 View  08/16/2011  *RADIOLOGY REPORT*  Clinical Data: Loss of consciousness.  PORTABLE CHEST - 1 VIEW  Comparison: Chest x-ray 12/27/2006.  Findings: An endotracheal tube is in place with tip 1.5 cm above the carina. A nasogastric tube is seen extending into the stomach, however, the tip of the nasogastric tube extends below the lower margin of the image.  Lung volumes are low.  Bibasilar opacities (left greater than right) may represent areas of atelectasis and/or consolidation.  In addition, there are patchy interstitial and airspace foci scattered throughout the lungs bilaterally, most pronounced throughout the right mid and upper lung, as well as the left suprahilar region.  Mild blunting of the left costophrenic sulcus may represent small left pleural effusion.  Pulmonary venous congestion without frank pulmonary edema.  Heart size is within normal limits. The patient is rotated to the left on today's exam, resulting in distortion of the mediastinal contours and reduced diagnostic sensitivity and specificity for mediastinal pathology.  IMPRESSION: 1.  Support apparatus, as above.  The state node of the low-lying endotracheal tube and consider withdrawal approximately 3 cm for more optimal placement. 2.  Patchy interstitial and airspace disease scattered throughout the lungs bilaterally, with an asymmetric distribution.  Findings are concerning for multilobar pneumonia or sequela of aspiration. 3.  Bibasilar subsegmental atelectasis. 4.  Possible small left pleural effusion.  Original Report Authenticated By: Florencia Reasons, M.D.     Scheduled Meds:   . antiseptic oral rinse  15 mL Mouth Rinse QID  . chlorhexidine  15 mL Mouth Rinse BID  . niMODipine  60 mg Oral Q4H  . sodium chloride      . DISCONTD: niMODipine  60 mg Oral Q4H   Continuous Infusions:   Lambert Keto, MD  [facility] Pager [phone]  If 7PM-7AM, please contact night-coverage [email or web address] Password [email or web address] 08/18/2011, 8:09 AM

## 2011-08-18 NOTE — Progress Notes (Signed)
[facility] Anzley Dibbern A. Gerilyn Pilgrim, MD     [email or web address]        The patient overnight has remained essentially unchanged. Her examination is outlined below is essentially unchanged. Blood pressure is somewhat elevated but this morning it has improved.   GENERAL: The patient is extubated.  HEENT: She does have some nuchal rigidity.  ABDOMEN: soft  EXTREMITIES: No edema  BACK: Normal.  SKIN: Normal by inspection.  MENTAL STATUS: She lays in bed with eyes closed. She opens partially eyes to verbal command. The patient follows axial and appendicular commands briskly. She states that she is doing fine.  CRANIAL NERVES: Pupils are equal, round and reactive to light and accomodation; she has a significant left exotropia but has full extraocular movements horizontally in both directions, she seems to have no upgaze and limited movements on down gaze; upper and lower facial muscles are normal in strength and symmetric, there is no flattening of the nasolabial folds; tongue is midline; uvula is midline.  MOTOR: She had antigravity strength in the upper extremities. She wiggles her toes and she hold up 2 fingers consistently.  COORDINATION: No dysmetria noted. No tremors.  REFLEXES: Deep tendon reflexes are symmetrical and normal.  SENSATION: She withdraws to deep painful stimuli bilaterally.  ASSESSMENT/PLAN: Diffuse subretinal hemorrhage with moderate to severe neurological deficit. I estimate her Hunt Hess scale to be 3. Glasgow Coma Scale 13.  I did discuss the case extensively with Dr. Venetia Maxon neurosurgeon in [place]. He has agreed to accept the patient for transfer given her improving neurological status. Arrangements are currently being made for that transfer to happen. She should go to the neuro ICU. From their arrangements were made for her to have a angiography. This also discussed with Dr. Marcheta Grammes from neuroradiology. Continuing nimodipine pain and BP control is also important. The  case was discussed at length with the family members were present today at the bedside.

## 2011-08-19 ENCOUNTER — Encounter (HOSPITAL_COMMUNITY): Payer: Self-pay | Admitting: Radiology

## 2011-08-19 ENCOUNTER — Inpatient Hospital Stay (HOSPITAL_COMMUNITY): Payer: Medicaid Other

## 2011-08-19 ENCOUNTER — Encounter (HOSPITAL_COMMUNITY)
Admission: EM | Disposition: A | Discharge status: Rehabilitation Facility | Payer: Self-pay | Source: Home / Self Care | Attending: Neurosurgery

## 2011-08-19 ENCOUNTER — Inpatient Hospital Stay (HOSPITAL_COMMUNITY): Payer: Medicaid Other | Admitting: Anesthesiology

## 2011-08-19 ENCOUNTER — Encounter (HOSPITAL_COMMUNITY): Payer: Self-pay | Admitting: Anesthesiology

## 2011-08-19 DIAGNOSIS — J9600 Acute respiratory failure, unspecified whether with hypoxia or hypercapnia: Secondary | ICD-10-CM

## 2011-08-19 LAB — BLOOD GAS, ARTERIAL
Acid-Base Excess: 1.6 mmol/L (ref 0.0–2.0)
Bicarbonate: 25.2 mEq/L — ABNORMAL HIGH (ref 20.0–24.0)
FIO2: 0.4 %
TCO2: 26.3 mmol/L (ref 0–100)
pCO2 arterial: 36.2 mmHg (ref 35.0–45.0)
pO2, Arterial: 70.3 mmHg — ABNORMAL LOW (ref 80.0–100.0)

## 2011-08-19 LAB — BASIC METABOLIC PANEL
CO2: 25 mEq/L (ref 19–32)
Chloride: 104 mEq/L (ref 96–112)
GFR calc Af Amer: 71 mL/min — ABNORMAL LOW (ref >90)
Sodium: 141 mEq/L (ref 135–145)

## 2011-08-19 LAB — CBC
HCT: 36.2 % (ref 36.0–46.0)
MCV: 81.3 fL (ref 78.0–100.0)
Platelets: 285 10*3/uL (ref 150–400)
RBC: 4.45 MIL/uL (ref 3.87–5.11)
WBC: 14.4 10*3/uL — ABNORMAL HIGH (ref 4.0–10.5)

## 2011-08-19 LAB — GLUCOSE, CAPILLARY: Glucose-Capillary: 144 mg/dL — ABNORMAL HIGH (ref 70–99)

## 2011-08-19 SURGERY — RADIOLOGY WITH ANESTHESIA
Anesthesia: General

## 2011-08-19 MED ORDER — VECURONIUM BROMIDE 10 MG IV SOLR
INTRAVENOUS | Status: DC | PRN
  Administered 2011-08-19: 1 mg via INTRAVENOUS
  Administered 2011-08-19: 3 mg via INTRAVENOUS

## 2011-08-19 MED ORDER — SODIUM CHLORIDE 0.9 % IV SOLN
INTRAVENOUS | Status: DC | PRN
  Administered 2011-08-19: 11:00:00 via INTRAVENOUS

## 2011-08-19 MED ORDER — IOHEXOL 300 MG/ML  SOLN
350.0000 mL | Freq: Once | INTRAMUSCULAR | Status: AC | PRN
  Administered 2011-08-19: 200 mL via INTRA_ARTERIAL

## 2011-08-19 MED ORDER — VERAPAMIL HCL 2.5 MG/ML IV SOLN
5.0000 mg | Freq: Once | INTRAVENOUS | Status: DC
  Filled 2011-08-19: qty 2

## 2011-08-19 MED ORDER — NICARDIPINE HCL IN NACL 20-0.86 MG/200ML-% IV SOLN
5.0000 mg/h | INTRAVENOUS | Status: DC
  Administered 2011-08-19 (×2): 5 mg/h via INTRAVENOUS
  Administered 2011-08-20 (×3): 7 mg/h via INTRAVENOUS
  Administered 2011-08-20 (×2): 5 mg/h via INTRAVENOUS
  Administered 2011-08-21: 9.5 mg/h via INTRAVENOUS
  Administered 2011-08-21: 5 mg/h via INTRAVENOUS
  Administered 2011-08-21: 9.5 mg/h via INTRAVENOUS
  Administered 2011-08-21: 5 mg/h via INTRAVENOUS
  Administered 2011-08-21: 11 mg/h via INTRAVENOUS
  Administered 2011-08-21: 9.5 mg/h via INTRAVENOUS
  Administered 2011-08-21: 7 mg/h via INTRAVENOUS
  Administered 2011-08-22: 5 mg/h via INTRAVENOUS
  Administered 2011-08-22: 10 mg/h via INTRAVENOUS
  Filled 2011-08-19 (×19): qty 200

## 2011-08-19 MED ORDER — ACETAMINOPHEN 500 MG PO TABS
1000.0000 mg | ORAL_TABLET | Freq: Four times a day (QID) | ORAL | Status: DC | PRN
  Administered 2011-08-21 (×3): 1000 mg via ORAL
  Filled 2011-08-19 (×4): qty 2

## 2011-08-19 MED ORDER — SODIUM CHLORIDE 0.9 % IJ SOLN
1.5000 mg | INTRAVENOUS | Status: AC
  Filled 2011-08-19: qty 0.3

## 2011-08-19 MED ORDER — INSULIN ASPART 100 UNIT/ML ~~LOC~~ SOLN
0.0000 [IU] | SUBCUTANEOUS | Status: DC
  Administered 2011-08-19: 3 [IU] via SUBCUTANEOUS

## 2011-08-19 MED ORDER — SODIUM CHLORIDE 0.9 % IV SOLN: INTRAVENOUS | Status: AC

## 2011-08-19 MED ORDER — FENTANYL CITRATE 0.05 MG/ML IJ SOLN
50.0000 ug | INTRAMUSCULAR | Status: DC | PRN
  Administered 2011-08-19: 100 ug via INTRAVENOUS
  Administered 2011-08-19: 50 ug via INTRAVENOUS
  Administered 2011-08-20: 100 ug via INTRAVENOUS
  Administered 2011-08-20 (×4): 50 ug via INTRAVENOUS
  Administered 2011-08-21 (×2): 100 ug via INTRAVENOUS
  Administered 2011-08-21: 50 ug via INTRAVENOUS
  Administered 2011-08-21 – 2011-08-26 (×2): 100 ug via INTRAVENOUS
  Administered 2011-08-27: 50 ug via INTRAVENOUS
  Administered 2011-08-27 (×3): 100 ug via INTRAVENOUS
  Administered 2011-08-27: 50 ug via INTRAVENOUS
  Administered 2011-08-27 – 2011-08-28 (×3): 100 ug via INTRAVENOUS
  Administered 2011-08-28: 50 ug via INTRAVENOUS
  Administered 2011-08-28 (×4): 100 ug via INTRAVENOUS
  Filled 2011-08-19 (×27): qty 2

## 2011-08-19 MED ORDER — INSULIN ASPART 100 UNIT/ML ~~LOC~~ SOLN
0.0000 [IU] | SUBCUTANEOUS | Status: DC
  Administered 2011-08-20: 2 [IU] via SUBCUTANEOUS
  Administered 2011-08-20: 5 [IU] via SUBCUTANEOUS
  Administered 2011-08-20 – 2011-08-21 (×5): 2 [IU] via SUBCUTANEOUS
  Administered 2011-08-21 – 2011-08-22 (×6): 3 [IU] via SUBCUTANEOUS
  Administered 2011-08-22 (×3): 2 [IU] via SUBCUTANEOUS
  Administered 2011-08-22: 3 [IU] via SUBCUTANEOUS
  Administered 2011-08-22 – 2011-08-24 (×9): 2 [IU] via SUBCUTANEOUS
  Administered 2011-08-24 – 2011-08-25 (×4): 3 [IU] via SUBCUTANEOUS
  Administered 2011-08-25: 2 [IU] via SUBCUTANEOUS
  Administered 2011-08-25: 3 [IU] via SUBCUTANEOUS
  Administered 2011-08-25: 2 [IU] via SUBCUTANEOUS
  Administered 2011-08-25: 3 [IU] via SUBCUTANEOUS
  Administered 2011-08-25: 2 [IU] via SUBCUTANEOUS
  Administered 2011-08-26 (×3): 3 [IU] via SUBCUTANEOUS
  Administered 2011-08-26: 2 [IU] via SUBCUTANEOUS
  Administered 2011-08-26: 3 [IU] via SUBCUTANEOUS
  Administered 2011-08-26 – 2011-08-27 (×3): 2 [IU] via SUBCUTANEOUS
  Administered 2011-08-27 (×2): 3 [IU] via SUBCUTANEOUS
  Administered 2011-08-28: 2 [IU] via SUBCUTANEOUS
  Administered 2011-08-28: 3 [IU] via SUBCUTANEOUS
  Administered 2011-08-28 (×3): 2 [IU] via SUBCUTANEOUS
  Administered 2011-08-29 (×3): 3 [IU] via SUBCUTANEOUS
  Administered 2011-08-29: 5 [IU] via SUBCUTANEOUS
  Administered 2011-08-29 – 2011-08-30 (×3): 3 [IU] via SUBCUTANEOUS
  Administered 2011-08-30: 2 [IU] via SUBCUTANEOUS
  Administered 2011-08-30: 3 [IU] via SUBCUTANEOUS
  Administered 2011-08-30: 2 [IU] via SUBCUTANEOUS
  Administered 2011-08-30: 3 [IU] via SUBCUTANEOUS
  Administered 2011-08-30: 5 [IU] via SUBCUTANEOUS
  Administered 2011-08-30: 3 [IU] via SUBCUTANEOUS
  Administered 2011-08-31: 2 [IU] via SUBCUTANEOUS
  Administered 2011-08-31 (×3): 3 [IU] via SUBCUTANEOUS
  Administered 2011-08-31: 2 [IU] via SUBCUTANEOUS
  Administered 2011-09-01: 3 [IU] via SUBCUTANEOUS
  Administered 2011-09-01 (×2): 2 [IU] via SUBCUTANEOUS
  Administered 2011-09-01 – 2011-09-02 (×3): 3 [IU] via SUBCUTANEOUS
  Administered 2011-09-03 – 2011-09-04 (×8): 2 [IU] via SUBCUTANEOUS
  Administered 2011-09-04 – 2011-09-05 (×2): 3 [IU] via SUBCUTANEOUS
  Administered 2011-09-05 (×3): 2 [IU] via SUBCUTANEOUS
  Administered 2011-09-05 – 2011-09-07 (×5): 3 [IU] via SUBCUTANEOUS

## 2011-08-19 MED ORDER — ROCURONIUM BROMIDE 100 MG/10ML IV SOLN
INTRAVENOUS | Status: DC | PRN
  Administered 2011-08-19: 50 mg via INTRAVENOUS

## 2011-08-19 MED ORDER — ONDANSETRON HCL 4 MG/2ML IJ SOLN: 4.0000 mg | Freq: Once | INTRAMUSCULAR | Status: DC | PRN

## 2011-08-19 MED ORDER — ACETAMINOPHEN 650 MG RE SUPP
650.0000 mg | Freq: Four times a day (QID) | RECTAL | Status: DC | PRN
  Administered 2011-08-19 – 2011-08-21 (×5): 650 mg via RECTAL
  Filled 2011-08-19 (×6): qty 1

## 2011-08-19 MED ORDER — ONDANSETRON HCL 4 MG/2ML IJ SOLN: 4.0000 mg | Freq: Four times a day (QID) | INTRAMUSCULAR | Status: DC | PRN

## 2011-08-19 MED ORDER — PROPOFOL 10 MG/ML IV BOLUS
INTRAVENOUS | Status: DC | PRN
  Administered 2011-08-19: 50 mg via INTRAVENOUS
  Administered 2011-08-19: 200 mg via INTRAVENOUS

## 2011-08-19 MED ORDER — LIDOCAINE HCL (CARDIAC) 20 MG/ML IV SOLN
INTRAVENOUS | Status: DC | PRN
  Administered 2011-08-19: 100 mg via INTRAVENOUS

## 2011-08-19 MED ORDER — NICARDIPINE HCL IN NACL 20-0.86 MG/200ML-% IV SOLN
5.0000 mg/h | INTRAVENOUS | Status: DC
  Filled 2011-08-19: qty 200

## 2011-08-19 MED ORDER — HYDROMORPHONE HCL PF 1 MG/ML IJ SOLN: 0.2500 mg | INTRAMUSCULAR | Status: DC | PRN

## 2011-08-19 MED ORDER — FENTANYL CITRATE 0.05 MG/ML IJ SOLN
INTRAMUSCULAR | Status: DC | PRN
  Administered 2011-08-19: 100 ug via INTRAVENOUS
  Administered 2011-08-19: 250 ug via INTRAVENOUS
  Administered 2011-08-19: 150 ug via INTRAVENOUS

## 2011-08-19 MED ORDER — HEPARIN (PORCINE) IN NACL 100-0.45 UNIT/ML-% IJ SOLN
500.0000 [IU]/h | INTRAMUSCULAR | Status: DC
  Filled 2011-08-19 (×2): qty 250

## 2011-08-19 MED ORDER — HEPARIN SODIUM (PORCINE) 1000 UNIT/ML IJ SOLN
INTRAMUSCULAR | Status: DC | PRN
  Administered 2011-08-19 (×3): 1000 [IU] via INTRAVENOUS

## 2011-08-19 MED ORDER — SODIUM CHLORIDE 0.9 % IV SOLN
10.0000 mg | INTRAVENOUS | Status: DC | PRN
  Administered 2011-08-19: 10 ug/min via INTRAVENOUS

## 2011-08-19 MED ORDER — MIDAZOLAM HCL 2 MG/2ML IJ SOLN
2.0000 mg | Freq: Once | INTRAMUSCULAR | Status: DC
  Filled 2011-08-19 (×2): qty 4
  Filled 2011-08-19 (×8): qty 2
  Filled 2011-08-19 (×2): qty 4
  Filled 2011-08-19 (×2): qty 2
  Filled 2011-08-19 (×2): qty 4

## 2011-08-19 MED ORDER — MIDAZOLAM HCL 2 MG/2ML IJ SOLN
2.0000 mg | INTRAMUSCULAR | Status: DC | PRN
  Administered 2011-08-19: 4 mg via INTRAVENOUS
  Administered 2011-08-19 – 2011-08-21 (×13): 2 mg via INTRAVENOUS
  Administered 2011-08-22: 4 mg via INTRAVENOUS
  Administered 2011-08-22 – 2011-08-23 (×2): 2 mg via INTRAVENOUS
  Administered 2011-08-23 – 2011-08-24 (×5): 4 mg via INTRAVENOUS
  Administered 2011-08-24 (×2): 2 mg via INTRAVENOUS
  Administered 2011-08-24: 4 mg via INTRAVENOUS
  Administered 2011-08-24: 2 mg via INTRAVENOUS
  Administered 2011-08-25 – 2011-08-27 (×3): 4 mg via INTRAVENOUS
  Filled 2011-08-19: qty 2
  Filled 2011-08-19 (×2): qty 4
  Filled 2011-08-19: qty 2
  Filled 2011-08-19 (×2): qty 4
  Filled 2011-08-19: qty 2
  Filled 2011-08-19 (×2): qty 4
  Filled 2011-08-19 (×6): qty 2

## 2011-08-19 NOTE — Procedures (Signed)
S/P Bilateral  coomon carotid  arteriograms followed by primary coiling of approx 7mm x 4mm ACom aneurysm. Mod spasm of proximal anterior cerebral arteries.Marland Kitchen

## 2011-08-19 NOTE — Anesthesia Postprocedure Evaluation (Signed)
  Anesthesia Post-op Note  Patient: [name]  Procedure(s) Performed: Procedure(s) (LRB): RADIOLOGY WITH ANESTHESIA (N/A)  Patient Location: ICU  Anesthesia Type: General  Level of Consciousness: sedated  Airway and Oxygen Therapy: Patient remains intubated per anesthesia plan and Patient placed on Ventilator (see vital sign flow sheet for setting)  Post-op Pain: none  Post-op Assessment: Post-op Vital signs reviewed, Patient's Cardiovascular Status Stable, Respiratory Function Stable and Patent Airway  Post-op Vital Signs: Reviewed and stable  Complications: No apparent anesthesia complications

## 2011-08-19 NOTE — Progress Notes (Signed)
PT/OT Cancellation Note  Treatment cancelled today due to noted plan for Cerebral Arteriogram with possible embolization of intracranial aneurysm.  Will hold today and check back tomorrow for therapy evals.  Please advance activity order if pt is appropriate for therapies tomorrow.  Thanks.    Sunny Schlein, [place] [phone] 08/19/2011, 8:37 AM

## 2011-08-19 NOTE — Progress Notes (Signed)
OT Cancellation Note  Treatment cancelled today due to medical issues with patient which prohibited therapy (strict bedrest). OT to await increased activity orders.  Harrel Carina Matthews Pager: [phone]  08/19/2011, 7:30 AM

## 2011-08-19 NOTE — H&P (Signed)
[name] is an 61 y.o. female.   Chief Complaint: severe headache 08/16/11; unresponsive and taken to [facility]. Work up reveals SAH; Anterior communicating artery aneurysm Transferred to [facility] yesterday; extubated yesterday Ventriculostomy in place Scheduled for cerebral arteriogram with possible embolization of intracranial aneurysm HPI: HTN  Past Medical History  Diagnosis Date  . Hypertension     Past Surgical History  Procedure Date  . Abdominal hysterectomy     History reviewed. No pertinent family history. Social History:  reports that she has never smoked. She does not have any smokeless tobacco history on file. She reports that she does not drink alcohol or use illicit drugs.  Allergies:  Allergies  Allergen Reactions  . Lactose Intolerance (Gi) Other (See Comments)    G.I. Upset    Medications Prior to Admission  Medication Sig Dispense Refill  . cloNIDine (CATAPRES) 0.1 MG tablet Take 0.1 mg by mouth 2 (two) times daily.      . hydrochlorothiazide (HYDRODIURIL) 25 MG tablet Take 1 tablet (25 mg total) by mouth daily.  20 tablet  0  . ibuprofen (ADVIL,MOTRIN) 800 MG tablet Take 800 mg by mouth every 8 (eight) hours as needed. Pain      . DISCONTD: methylcellulose (ARTIFICIAL TEARS) 1 % ophthalmic solution Place 1 drop into both eyes as needed. Dry Eyes        Results for orders placed during the hospital encounter of 08/16/11 (from the past 48 hour(s))  TYPE AND SCREEN     Status: Normal   Collection Time   08/18/11  1:34 PM      Component Value Range Comment   ABO/RH(D) B POS      Antibody Screen NEG      Sample Expiration 08/21/2011     ABO/RH     Status: Normal   Collection Time   08/18/11  1:34 PM      Component Value Range Comment   ABO/RH(D) B POS     CBC WITH DIFFERENTIAL     Status: Abnormal   Collection Time   08/18/11  1:36 PM      Component Value Range Comment   WBC 13.5 (*) 4.0 - 10.5 K/uL    RBC 5.09  3.87 - 5.11 MIL/uL    Hemoglobin 13.5   12.0 - 15.0 g/dL    HCT 45.4  09.8 - 11.9 %    MCV 80.6  78.0 - 100.0 fL    MCH 26.5  26.0 - 34.0 pg    MCHC 32.9  30.0 - 36.0 g/dL    RDW 14.7  82.9 - 56.2 %    Platelets 269  150 - 400 K/uL    Neutrophils Relative 90 (*) 43 - 77 %    Neutro Abs 12.1 (*) 1.7 - 7.7 K/uL    Lymphocytes Relative 6 (*) 12 - 46 %    Lymphs Abs 0.8  0.7 - 4.0 K/uL    Monocytes Relative 4  3 - 12 %    Monocytes Absolute 0.6  0.1 - 1.0 K/uL    Eosinophils Relative 0  0 - 5 %    Eosinophils Absolute 0.0  0.0 - 0.7 K/uL    Basophils Relative 0  0 - 1 %    Basophils Absolute 0.0  0.0 - 0.1 K/uL   COMPREHENSIVE METABOLIC PANEL     Status: Abnormal   Collection Time   08/18/11  1:36 PM      Component Value Range Comment  Sodium 139  135 - 145 mEq/L    Potassium 3.2 (*) 3.5 - 5.1 mEq/L    Chloride 97  96 - 112 mEq/L    CO2 28  19 - 32 mEq/L    Glucose, Bld 187 (*) 70 - 99 mg/dL    BUN 17  6 - 23 mg/dL    Creatinine, Ser 9.60  0.50 - 1.10 mg/dL    Calcium 9.3  8.4 - 45.4 mg/dL    Total Protein 7.9  6.0 - 8.3 g/dL    Albumin 3.0 (*) 3.5 - 5.2 g/dL    AST 28  0 - 37 U/L    ALT 16  0 - 35 U/L    Alkaline Phosphatase 75  39 - 117 U/L    Total Bilirubin 0.6  0.3 - 1.2 mg/dL    GFR calc non Af Amer 71 (*) >90 mL/min    GFR calc Af Amer 83 (*) >90 mL/min   PROTIME-INR     Status: Normal   Collection Time   08/18/11  1:36 PM      Component Value Range Comment   Prothrombin Time 14.7  11.6 - 15.2 seconds    INR 1.13  0.00 - 1.49   APTT     Status: Normal   Collection Time   08/18/11  1:36 PM      Component Value Range Comment   aPTT 32  24 - 37 seconds   PROCALCITONIN     Status: Normal   Collection Time   08/18/11  1:36 PM      Component Value Range Comment   Procalcitonin 0.35     GLUCOSE, CAPILLARY     Status: Abnormal   Collection Time   08/19/11 12:17 AM      Component Value Range Comment   Glucose-Capillary 170 (*) 70 - 99 mg/dL    Ct Angio Head W/cm &/or Wo Cm  08/18/2011  *RADIOLOGY REPORT*   Clinical Data:  61 year old female with diffuse subarachnoid hemorrhage.  CT ANGIOGRAPHY HEAD  Technique:  Multidetector CT imaging of the head was performed using the standard protocol during bolus administration of intravenous contrast.  Multiplanar CT image reconstructions including MIPs were obtained to evaluate the vascular anatomy.  Contrast: 80mL OMNIPAQUE IOHEXOL 350 MG/ML SOLN  Comparison:  Head CT without contrast 08/16/2011.  Findings:  Continued widespread hyperdense subarachnoid hemorrhage, there is slightly more volume of hemorrhage to the left of midline as before, but the anterior basilar cistern volume of hemorrhage is not significantly changed.  There is now associated cytotoxic edema in the right gyrus rectus and cingulate gyrus (right ACA territory).  Increased volume of intraventricular hemorrhage.  Ventriculomegaly which is mildly progressed.  No definite transependymal edema.  Stable gray-white matter differentiation outside of the right ACA territory.  Stable orbit and scalp soft tissues.  Paranasal sinuses and mastoids are clear. No acute osseous abnormality identified.  Vascular Findings: Major intracranial venous structures appear normally enhanced.  There is a saccular, slightly irregular shaped aneurysm arising from the left aspect of the anterior communicating artery directed anteriorly and inferiorly and encompassing 5 x 6 x 5 mm (AP by transverse by CC).  The aneurysm neck measures up to 2-3 mm.   The left ACA A1 segment is dominant.  The left ACA branches remain patent.  A small median artery of the corpus callosum is present and patent.   There is severe narrowing of the distal right ACA A1 segment, in the region  of cytotoxic edema noted earlier, but preserved distal right ACA flow in on the left.  Severe tortuosity of the distal left cervical ICA.  Negative distal right cervical ICA.  Both ICA siphons are patent with minimal atherosclerosis and no stenosis.  Bilateral ophthalmic  artery origins are within normal limits.  Right posterior communicating artery origin is within normal limits.  Small infundibula of the left anterior choroidal artery and/or diminutive posterior communicating artery.  The carotid termini are within normal limits.  The MCA and ACA origins are within normal limits.  The bilateral MCA branches are within normal limits.  Patent distal vertebral arteries, the left is mildly dominant. There may be an unusual fenestration of the left vertebral artery at the C1 level (series 604 image 20).  Alternatively, this might be sequelae of a remote dissection.  No distal vertebral artery stenosis.  Mild irregularity of the distal left vertebral artery V4 segment. Severe irregularity of the right distal V4 segment with moderate to severe stenosis just proximal to the vertebrobasilar junction. Therefore, the basilar is primarily supplied from the left.  The basilar artery is irregular throughout its course with multi focal areas of mild stenosis.  Both AICA vessels are patent and appeared dominant.  SCA and left PCA origins are within normal limits.  Fetal right PCA origin.  Moderate irregularity throughout both PCA branches, with tandem areas of up to moderate stenosis.  There is preserved bilateral distal PCA flow despite this appearance.   Review of the MIP images confirms the above findings.  IMPRESSION: 1.  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  This is likely the source of the acute subarachnoid hemorrhage, see details above. 2.  Interval development of right ACA infarct associated with severe right ACA A2 segment stenosis.  No significant mass effect. No hemorrhagic transformation. 3.  Continued moderate-to-large volume of subarachnoid hemorrhage. 4.  Mildly progressed ventriculomegaly.  Increased intraventricular hemorrhage volume.  No definite transependymal edema. 5.  Moderate to severe posterior circulation atherosclerosis as detailed above.  No major  intracranial artery branch occlusion.  Preliminary report called to Dr. Venetia Maxon in the OR at 1523 hours on 08/18/2011.  Original Report Authenticated By: Harley Hallmark, M.D.    Review of Systems  Constitutional: Negative for fever.  Neurological: Positive for loss of consciousness and headaches.    Blood pressure 142/63, pulse 61, temperature 100.5 F (38.1 C), temperature source Axillary, resp. rate 21, height 5\' 9"  (1.753 m), weight 217 lb 6 oz (98.6 kg), SpO2 100.00%. Physical Exam  Constitutional: She appears well-developed.  Cardiovascular: Normal rate and regular rhythm.   Respiratory: Effort normal and breath sounds normal.  GI: Soft. Bowel sounds are normal.  Neurological:       Not responsive Ventric in place     Assessment/Plan Severe ha;to ED at [facility] Workup shows SAH - A COM aneurysm On vent til yesterday Scheduled for cerebral arteriogram with possible coiling embolization pts family aware of procedure benefits and risks and agreeable to proceed Consent signed and in chart  Analei Whinery A 08/19/2011, 8:15 AM

## 2011-08-19 NOTE — Progress Notes (Signed)
PT Cancellation Note  Treatment cancelled today due to pt currently on bedrest.  Please advance activity to allow for therapy.  Thanks.    Hawkin Charo, Alison Murray 08/19/2011, 7:44 AM

## 2011-08-19 NOTE — Preoperative (Signed)
Beta Blockers   Reason not to administer Beta Blockers:Not Applicable 

## 2011-08-19 NOTE — Progress Notes (Signed)
I agree with the following treatment note after reviewing documentation.   Johnston, Rhapsody Wolven Brynn   OTR/L Pager: [phone] Office: [phone] .   

## 2011-08-19 NOTE — Anesthesia Preprocedure Evaluation (Addendum)
Anesthesia Evaluation  Patient identified by MRN, date of birth, ID band Patient confused    Reviewed: Allergy & Precautions, H&P , NPO status , Patient's Chart, lab work & pertinent test results  Airway Mallampati: II TM Distance: >3 FB     Dental  (+) Teeth Intact   Pulmonary  breath sounds clear to auscultation        Cardiovascular hypertension, Pt. on medications Rhythm:Regular Rate:Normal     Neuro/Psych    GI/Hepatic   Endo/Other    Renal/GU      Musculoskeletal   Abdominal   Peds  Hematology   Anesthesia Other Findings Unable to give dental advisory due to decreased mental status  Reproductive/Obstetrics                          Anesthesia Physical Anesthesia Plan  ASA: IV and Emergent  Anesthesia Plan: General   Post-op Pain Management:    Induction: Intravenous  Airway Management Planned: Oral ETT  Additional Equipment: Arterial line  Intra-op Plan:   Post-operative Plan: Post-operative intubation/ventilation  Informed Consent: I have reviewed the patients History and Physical, chart, labs and discussed the procedure including the risks, benefits and alternatives for the proposed anesthesia with the patient or authorized representative who has indicated his/her understanding and acceptance.   History available from chart only  Plan Discussed with: CRNA, Anesthesiologist and Surgeon  Anesthesia Plan Comments:        Anesthesia Quick Evaluation

## 2011-08-19 NOTE — Progress Notes (Signed)
VASCULAR LAB PRELIMINARY  PRELIMINARY  PRELIMINARY  PRELIMINARY  Transcranial Doppler  Date POD PCO2 HCT BP  MCA ACA PCA OPHT SIPH VERT Basilar  8-16- 13 SB     Right  Left   54  64   --  -29   -22  33   13  13   37  26   *  -24     -37         Right  Left                                            Right  Left                                             Right  Left                                             Right  Left                                            Right  Left                                            Right  Left                                        MCA = Middle Cerebral Artery      OPHT = Opthalmic Artery     BASILAR = Basilar Artery   ACA = Anterior Cerebral Artery     SIPH = Carotid Siphon PCA = Posterior Cerebral Artery   VERT = Verterbral Artery                   Normal MCA = 62+\-12 ACA = 50+\-12 PCA = 42+\-23      *Right vertebral not done.  Technically difficult due to the patient's head laying to the right side.   [name], 08/19/2011, 3:28 PM

## 2011-08-19 NOTE — Consult Note (Signed)
Name: [name] MRN: [id number] DOB: 1950-10-08    LOS: 3  Referring Provider:  Venetia Maxon (nsgy) Reason for Referral:  ICU management   PULMONARY / CRITICAL CARE MEDICINE   Brief Summary:   61 yo with HTN brought to [facility] 8/13 with headache and acute encephalopathy. Intubated.  Admitted for diffuse subarachnoid hemorrhage and aspiration pneumonia.  Due to the extent of the hemorrhage, comfort care was recommended and the patient was extubated for transition to comfort care.   However patient began to show signs of neurologic improvement and family decided to pursue further aggressive care. Transferred to [facility] for possible Neurological intervention.  Interval history:  IR, postop VDRF  Events Since Admit: 8/13  Presented to [facility] with SAH, intubated, extubated to comfort but improved 8/15  Transferred to [facility] 8/16  IR, postop VDRF  Vital Signs: Temp:  [98.9 F (37.2 C)-100.5 F (38.1 C)] 99.6 F (37.6 C) (08/16 0900) Pulse Rate:  [56-89] 70  (08/16 1507) Resp:  [16-24] 16  (08/16 1507) BP: (121-188)/(54-115) 147/62 mmHg (08/16 1500) SpO2:  [92 %-100 %] 98 % (08/16 1507) Arterial Line BP: (144-209)/(63-88) 165/63 mmHg (08/16 1507) FiO2 (%):  [39.3 %-40.3 %] 39.3 % (08/16 1507) Weight:  [217 lb 6 oz (98.6 kg)] 217 lb 6 oz (98.6 kg) (08/16 0500)  Physical Examination: General:  Morbidly obese female, NAD Neuro:  Arouses to noxious stimuli, intermittent agitation, moves R arm, L weak HEENT:  Mm moist, no JVD Cardiovascular:  s1s2 rrr Lungs:  resps even non labored on vent, lungs bilaterally coarse Abdomen:  Soft, obese, +bs Ext: no edema   Principal Problem:  *Subarachnoid hemorrhage Active Problems:  Encephalopathy  Aspiration pneumonia  Acute respiratory failure  HTN (hypertension)  ASSESSMENT AND PLAN  PULMONARY  Lab 08/19/11 1448 08/16/11 0758  PHART 7.456* 7.397  PCO2ART 36.2 40.0  PO2ART 70.3* 93.7  HCO3 25.2* 24.1*  O2SAT 93.8 96.5   Ventilator Settings: Vent  Mode:  [-] PRVC FiO2 (%):  [39.3 %-40.3 %] 39.3 % Vt Set:  [530 mL] 530 mL PEEP:  [5 cmH20] 5 cmH20 Plateau Pressure:  [24 cmH20] 24 cmH20 CXR:  8/13 >>> Bilateral airspace disease ETT:   8/13>>8/14, 8/16 >>>  A:  Postop VDRF.  Aspiration pneumonia vs pneumonitis.  P:   Full vent support CXR ABG SBT in AM  CARDIOVASCULAR  Lab 08/16/11 0630  TROPONINI <0.30  LATICACIDVEN --  PROBNP --   Lines:   8/16  L Fem Sheath>>> 8/16  R PICC >>>  A:  HTN P:  Per Neurology / Neurosurgery Cardene gtt  RENAL  Lab 08/18/11 1336 08/16/11 0630  NA 139 138  K 3.2* 3.1*  CL 97 100  CO2 28 24  BUN 17 17  CREATININE 0.85 0.97  CALCIUM 9.3 9.0  MG -- --  PHOS -- --   Foley:  8/13>>>  A: Hypokalemia.  Normal renal function. P:   Replete K PRN Trend BMP  GASTROINTESTINAL  Lab 08/18/11 1336 08/16/11 0630  AST 28 45*  ALT 16 27  ALKPHOS 75 82  BILITOT 0.6 0.5  PROT 7.9 7.2  ALBUMIN 3.0* 3.3*   A:  No acute issues. P:   NPO as intubated  HEMATOLOGIC  Lab 08/18/11 1336 08/16/11 0630  HGB 13.5 12.4  HCT 41.0 38.4  PLT 269 303  INR 1.13 --  APTT 32 --   A:  No acute issue. P:  Trend CBC Heparin per Neurology  INFECTIOUS  Lab 08/18/11  1336 08/16/11 0630  WBC 13.5* 12.4*  PROCALCITON 0.35 --   Cultures: None  Antibiotics: Unasyn 8/13 >>>  A:  Suspected aspiration pneumonia. P:   Antibiotics and cultures as above CBC in AM Will consider d/c ABx 8/17  ENDOCRINE  Lab 08/19/11 0916 08/19/11 0505 08/19/11 0017  GLUCAP 144* 150* 170*    A:  Hyperglycemia. P:   SSI/CBG  NEUROLOGIC  8/16 IVC Drain>>>  A:  Subarachnoid hemorrhage s/p aneurism coiling. Acute encephalopathy. Pain  P:   Per Neurology / Neurosurgery Fentanyl / Versed  BEST PRACTICE / DISPOSITION Level of Care:  ICU Primary Service:  Neurosurgery Consultants:  Neurology, PCCM Code Status:  Full Diet:  Regular DVT Px: SCD's GI Px:  Protonix Skin Integrity:  Intact Social /  Family:  No family available.   Canary Brim, NP-C [facility] Pgr: [phone] or [phone]  Patient examined.  Records reviewed.  Case discussed with NP.  Assessment and plan edited as above.  The patient is critically ill with multiple organ systems failure and requires high complexity decision making for assessment and support, frequent evaluation and titration of therapies, application of advanced monitoring technologies and extensive interpretation of multiple databases. Critical Care Time devoted to patient care services described in this note is 40 minutes.  Orlean Bradford, M.D., F.C.C.P. Pulmonary and Critical Care Medicine [facility] Cell: [phone] Pager: [phone]  .

## 2011-08-19 NOTE — Transfer of Care (Signed)
Immediate Anesthesia Transfer of Care Note  Patient: [name]  Procedure(s) Performed: Procedure(s) (LRB): RADIOLOGY WITH ANESTHESIA (N/A)  Patient Location: NICU   Anesthesia Type: General  Level of Consciousness: sedated and Patient remains intubated per anesthesia plan  Airway & Oxygen Therapy: Patient remains intubated per anesthesia plan and Patient placed on Ventilator (see vital sign flow sheet for setting)  Post-op Assessment: Post -op Vital signs reviewed and stable  Post vital signs: Reviewed and stable  Complications: No apparent anesthesia complications

## 2011-08-19 NOTE — Procedures (Signed)
Interventional Radiology Procedure Note  Procedure: Placement of right basilic vein 38 cm dual lumen PowerPICC>  Tip at superior cavoatrial junction and ready for use. Complications: None Recommendations: - Routine line care Signed,  Sterling Big, MD Vascular & Interventional Radiologist [facility] Radiology

## 2011-08-19 NOTE — Anesthesia Postprocedure Evaluation (Signed)
  Anesthesia Post-op Note  Patient: [name]  Procedure(s) Performed: Procedure(s) (LRB): RADIOLOGY WITH ANESTHESIA (N/A)  Patient Location: NICU  Anesthesia Type: General  Level of Consciousness: sedated and unresponsive  Airway and Oxygen Therapy: Patient Spontanous Breathing and Patient remains intubated per anesthesia plan  Post-op Pain: none  Post-op Assessment: Patient's Cardiovascular Status Stable and Respiratory Function Stable  Post-op Vital Signs: stable  Complications: No apparent anesthesia complications

## 2011-08-19 NOTE — Progress Notes (Signed)
Subjective: Patient reports less responsive this am.  Will stick out tongue, occasional words and eye opening with stimulation  Objective: Vital signs in last 24 hours: Temp:  [98.4 F (36.9 C)-100.5 F (38.1 C)] 99.6 F (37.6 C) (08/16 0900) Pulse Rate:  [52-91] 60  (08/16 0900) Resp:  [17-26] 21  (08/16 0900) BP: (121-209)/(54-115) 166/74 mmHg (08/16 0900) SpO2:  [92 %-100 %] 100 % (08/16 0900) Weight:  [98.4 kg (216 lb 14.9 oz)-98.6 kg (217 lb 6 oz)] 98.6 kg (217 lb 6 oz) (08/16 0500)  Intake/Output from previous day: 08/15 0701 - 08/16 0700 In: 1717.5 [P.O.:60; I.V.:1352.5; IV Piggyback:305] Out: 1354 [Urine:1215; Drains:139] Intake/Output this shift: Total I/O In: 355 [I.V.:250; IV Piggyback:105] Out: 88 [Urine:60; Drains:28]  Physical Exam: Opens eyes with stimulation.  PERRL.  Purposeful on left, withdraws on right, but less movement on right.  Lab Results:  Basename 08/18/11 1336  WBC 13.5*  HGB 13.5  HCT 41.0  PLT 269   BMET  Basename 08/18/11 1336  NA 139  K 3.2*  CL 97  CO2 28  GLUCOSE 187*  BUN 17  CREATININE 0.85  CALCIUM 9.3    Studies/Results: Ct Angio Head W/cm &/or Wo Cm  08/18/2011  *RADIOLOGY REPORT*  Clinical Data:  61 year old female with diffuse subarachnoid hemorrhage.  CT ANGIOGRAPHY HEAD  Technique:  Multidetector CT imaging of the head was performed using the standard protocol during bolus administration of intravenous contrast.  Multiplanar CT image reconstructions including MIPs were obtained to evaluate the vascular anatomy.  Contrast: 80mL OMNIPAQUE IOHEXOL 350 MG/ML SOLN  Comparison:  Head CT without contrast 08/16/2011.  Findings:  Continued widespread hyperdense subarachnoid hemorrhage, there is slightly more volume of hemorrhage to the left of midline as before, but the anterior basilar cistern volume of hemorrhage is not significantly changed.  There is now associated cytotoxic edema in the right gyrus rectus and cingulate gyrus  (right ACA territory).  Increased volume of intraventricular hemorrhage.  Ventriculomegaly which is mildly progressed.  No definite transependymal edema.  Stable gray-white matter differentiation outside of the right ACA territory.  Stable orbit and scalp soft tissues.  Paranasal sinuses and mastoids are clear. No acute osseous abnormality identified.  Vascular Findings: Major intracranial venous structures appear normally enhanced.  There is a saccular, slightly irregular shaped aneurysm arising from the left aspect of the anterior communicating artery directed anteriorly and inferiorly and encompassing 5 x 6 x 5 mm (AP by transverse by CC).  The aneurysm neck measures up to 2-3 mm.   The left ACA A1 segment is dominant.  The left ACA branches remain patent.  A small median artery of the corpus callosum is present and patent.   There is severe narrowing of the distal right ACA A1 segment, in the region of cytotoxic edema noted earlier, but preserved distal right ACA flow in on the left.  Severe tortuosity of the distal left cervical ICA.  Negative distal right cervical ICA.  Both ICA siphons are patent with minimal atherosclerosis and no stenosis.  Bilateral ophthalmic artery origins are within normal limits.  Right posterior communicating artery origin is within normal limits.  Small infundibula of the left anterior choroidal artery and/or diminutive posterior communicating artery.  The carotid termini are within normal limits.  The MCA and ACA origins are within normal limits.  The bilateral MCA branches are within normal limits.  Patent distal vertebral arteries, the left is mildly dominant. There may be an unusual fenestration of the left  vertebral artery at the C1 level (series 604 image 20).  Alternatively, this might be sequelae of a remote dissection.  No distal vertebral artery stenosis.  Mild irregularity of the distal left vertebral artery V4 segment. Severe irregularity of the right distal V4 segment  with moderate to severe stenosis just proximal to the vertebrobasilar junction. Therefore, the basilar is primarily supplied from the left.  The basilar artery is irregular throughout its course with multi focal areas of mild stenosis.  Both AICA vessels are patent and appeared dominant.  SCA and left PCA origins are within normal limits.  Fetal right PCA origin.  Moderate irregularity throughout both PCA branches, with tandem areas of up to moderate stenosis.  There is preserved bilateral distal PCA flow despite this appearance.   Review of the MIP images confirms the above findings.  IMPRESSION: 1.  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  This is likely the source of the acute subarachnoid hemorrhage, see details above. 2.  Interval development of right ACA infarct associated with severe right ACA A2 segment stenosis.  No significant mass effect. No hemorrhagic transformation. 3.  Continued moderate-to-large volume of subarachnoid hemorrhage. 4.  Mildly progressed ventriculomegaly.  Increased intraventricular hemorrhage volume.  No definite transependymal edema. 5.  Moderate to severe posterior circulation atherosclerosis as detailed above.  No major intracranial artery branch occlusion.  Preliminary report called to Dr. Venetia Maxon in the OR at 1523 hours on 08/18/2011.  Original Report Authenticated By: Harley Hallmark, M.D.   Ct Head Wo Contrast  08/19/2011  *RADIOLOGY REPORT*  Clinical Data: Reassess subarachnoid hemorrhage.  CT HEAD WITHOUT CONTRAST  Technique:  Contiguous axial images were obtained from the base of the skull through the vertex without contrast.  Comparison:  Compared 08/18/2011.  Findings:   The patient has had a ventricular catheter placed from a right frontal approach with improved hydrocephalus. The tip of the catheter lies near the septum pellucidum in the right lateral ventricle.  Intraventricular and subarachnoid blood persists. Focal clot left sylvian fissure.   Interhemispheric blood is associated with an acute right ACA territory infarct affecting the right frontal lobe.  IMPRESSION: Improved appearance status post ventricular catheter drainage.  No new subarachnoid blood or new areas of infarction.  Original Report Authenticated By: Elsie Stain, M.D.    Assessment/Plan: I reviewed new CT and plan with Dr. Durenda Age.  IVC well positioned and draining well.  She has had a small infarct in Left Internal capsule, which likely accounts for her right hemiparesis as well as a right frontal infarct.  I suspect she is in vasospasm and he will treat her for that if this is the case.  We need to proceed with coiling of the A-comm artery aneurysm so that we can more aggressively treat and manage her vasospasm.  Prognosis is guarded.    LOS: 3 days    Dorian Heckle, MD 08/19/2011, 10:29 AM

## 2011-08-19 NOTE — Progress Notes (Signed)
Inpatient Diabetes Program Recommendations  AACE/ADA: New Consensus Statement on Inpatient Glycemic Control  Target Ranges:  Prepandial:   less than 140 mg/dL      Peak postprandial:   less than 180 mg/dL (1-2 hours)      Critically ill patients:  140 - 180 mg/dL  Pager:  [phone] Hours:  8 am-10pm   Reason for Visit: Elevated lab glucose: 187 mg/dL  Inpatient Diabetes Program Recommendations  Correction (SSI): Add Novolog Correction  HgbA1C: Check HgbA1C to assess glycemic control   Alfredia Client PhD, RN, BC-ADM Diabetes Coordinator  Office:  [phone] Team Pager:  [phone]

## 2011-08-20 ENCOUNTER — Encounter (HOSPITAL_COMMUNITY): Payer: Self-pay | Admitting: Certified Registered"

## 2011-08-20 ENCOUNTER — Encounter (HOSPITAL_COMMUNITY): Payer: Self-pay | Admitting: Anesthesiology

## 2011-08-20 ENCOUNTER — Inpatient Hospital Stay (HOSPITAL_COMMUNITY): Payer: Medicaid Other

## 2011-08-20 LAB — CBC WITH DIFFERENTIAL/PLATELET
Basophils Relative: 0 % (ref 0–1)
HCT: 32.9 % — ABNORMAL LOW (ref 36.0–46.0)
Hemoglobin: 10.5 g/dL — ABNORMAL LOW (ref 12.0–15.0)
Lymphs Abs: 1.2 10*3/uL (ref 0.7–4.0)
MCH: 25.8 pg — ABNORMAL LOW (ref 26.0–34.0)
MCHC: 31.9 g/dL (ref 30.0–36.0)
Monocytes Absolute: 0.8 10*3/uL (ref 0.1–1.0)
Monocytes Relative: 8 % (ref 3–12)
Neutro Abs: 8.7 10*3/uL — ABNORMAL HIGH (ref 1.7–7.7)
RBC: 4.07 MIL/uL (ref 3.87–5.11)

## 2011-08-20 LAB — GLUCOSE, CAPILLARY
Glucose-Capillary: 145 mg/dL — ABNORMAL HIGH (ref 70–99)
Glucose-Capillary: 135 mg/dL — ABNORMAL HIGH (ref 70–99)
Glucose-Capillary: 201 mg/dL — ABNORMAL HIGH (ref 70–99)
Glucose-Capillary: 124 mg/dL — ABNORMAL HIGH (ref 70–99)

## 2011-08-20 LAB — BASIC METABOLIC PANEL
BUN: 22 mg/dL (ref 6–23)
Chloride: 108 mEq/L (ref 96–112)
Creatinine, Ser: 1.05 mg/dL (ref 0.50–1.10)
GFR calc Af Amer: 64 mL/min — ABNORMAL LOW (ref >90)
Glucose, Bld: 164 mg/dL — ABNORMAL HIGH (ref 70–99)
Potassium: 3 mEq/L — ABNORMAL LOW (ref 3.5–5.1)

## 2011-08-20 MED ORDER — DOCUSATE SODIUM 50 MG/5ML PO LIQD
100.0000 mg | Freq: Two times a day (BID) | ORAL | Status: DC
  Administered 2011-08-20 – 2011-08-23 (×5): 100 mg via ORAL
  Filled 2011-08-20 (×13): qty 10

## 2011-08-20 MED ORDER — PRO-STAT SUGAR FREE PO LIQD
30.0000 mL | Freq: Every day | ORAL | Status: DC
  Administered 2011-08-20 – 2011-08-27 (×35): 30 mL
  Filled 2011-08-20 (×40): qty 30

## 2011-08-20 MED ORDER — SODIUM CHLORIDE 0.9 % IV SOLN
INTRAVENOUS | Status: DC
  Administered 2011-08-20: 05:00:00 via INTRAVENOUS
  Administered 2011-08-20: 75 mL/h via INTRAVENOUS
  Administered 2011-08-21 – 2011-08-26 (×8): via INTRAVENOUS
  Administered 2011-08-26 – 2011-08-27 (×2): 100 mL/h via INTRAVENOUS
  Administered 2011-08-28: 13:00:00 via INTRAVENOUS
  Administered 2011-08-30: 1000 mL via INTRAVENOUS
  Administered 2011-09-01: 20 mL/h via INTRAVENOUS
  Administered 2011-09-03: 06:00:00 via INTRAVENOUS

## 2011-08-20 MED ORDER — OSMOLITE 1.2 CAL PO LIQD
1000.0000 mL | ORAL | Status: DC
  Administered 2011-08-20 – 2011-08-26 (×7): 1000 mL
  Filled 2011-08-20 (×10): qty 1000

## 2011-08-20 MED ORDER — POTASSIUM CHLORIDE 20 MEQ/15ML (10%) PO LIQD
40.0000 meq | Freq: Two times a day (BID) | ORAL | Status: AC
  Administered 2011-08-20 (×2): 40 meq
  Filled 2011-08-20 (×4): qty 30

## 2011-08-20 MED ORDER — SENNOSIDES 8.8 MG/5ML PO SYRP
10.0000 mL | ORAL_SOLUTION | Freq: Two times a day (BID) | ORAL | Status: DC
  Administered 2011-08-20 – 2011-08-23 (×5): 10 mL
  Filled 2011-08-20 (×13): qty 10

## 2011-08-20 MED ORDER — ADULT MULTIVITAMIN LIQUID CH
5.0000 mL | Freq: Every day | ORAL | Status: DC
  Administered 2011-08-20 – 2011-08-26 (×7): 5 mL via ORAL
  Filled 2011-08-20 (×8): qty 5

## 2011-08-20 NOTE — Progress Notes (Addendum)
Subjective: Pt remains intubated. Stable overnight  Objective: Physical Exam: BP 163/65  Pulse 95  Temp 101.1 F (38.4 C) (Oral)  Resp 24  Ht 5\' 9"  (1.753 m)  Wt 217 lb 9.5 oz (98.7 kg)  BMI 32.13 kg/m2  SpO2 100% She does open eyes to stimulus, but does not verbalize or follow commands. Family states she does move left arm hand somewhat frequently. (L)fem sheath intact, dressing clean. Legs/feet warm, pulses intact.  Labs: CBC  Basename 08/20/11 0600 08/19/11 1630  WBC 10.7* 14.4*  HGB 10.5* 11.7*  HCT 32.9* 36.2  PLT 230 285   BMET  Basename 08/20/11 0600 08/19/11 1630  NA 144 141  K 3.0* 3.2*  CL 108 104  CO2 26 25  GLUCOSE 164* 191*  BUN 22 21  CREATININE 1.05 0.96  CALCIUM 8.7 8.9   LFT  Basename 08/18/11 1336  PROT 7.9  ALBUMIN 3.0*  AST 28  ALT 16  ALKPHOS 75  BILITOT 0.6  BILIDIR --  IBILI --  LIPASE --   PT/INR  Basename 08/18/11 1336  LABPROT 14.7  INR 1.13     Studies/Results: Ct Angio Head W/cm &/or Wo Cm  08/18/2011  *RADIOLOGY REPORT*  Clinical Data:  61 year old female with diffuse subarachnoid hemorrhage.  CT ANGIOGRAPHY HEAD  Technique:  Multidetector CT imaging of the head was performed using the standard protocol during bolus administration of intravenous contrast.  Multiplanar CT image reconstructions including MIPs were obtained to evaluate the vascular anatomy.  Contrast: 80mL OMNIPAQUE IOHEXOL 350 MG/ML SOLN  Comparison:  Head CT without contrast 08/16/2011.  Findings:  Continued widespread hyperdense subarachnoid hemorrhage, there is slightly more volume of hemorrhage to the left of midline as before, but the anterior basilar cistern volume of hemorrhage is not significantly changed.  There is now associated cytotoxic edema in the right gyrus rectus and cingulate gyrus (right ACA territory).  Increased volume of intraventricular hemorrhage.  Ventriculomegaly which is mildly progressed.  No definite transependymal edema.  Stable  gray-white matter differentiation outside of the right ACA territory.  Stable orbit and scalp soft tissues.  Paranasal sinuses and mastoids are clear. No acute osseous abnormality identified.  Vascular Findings: Major intracranial venous structures appear normally enhanced.  There is a saccular, slightly irregular shaped aneurysm arising from the left aspect of the anterior communicating artery directed anteriorly and inferiorly and encompassing 5 x 6 x 5 mm (AP by transverse by CC).  The aneurysm neck measures up to 2-3 mm.   The left ACA A1 segment is dominant.  The left ACA branches remain patent.  A small median artery of the corpus callosum is present and patent.   There is severe narrowing of the distal right ACA A1 segment, in the region of cytotoxic edema noted earlier, but preserved distal right ACA flow in on the left.  Severe tortuosity of the distal left cervical ICA.  Negative distal right cervical ICA.  Both ICA siphons are patent with minimal atherosclerosis and no stenosis.  Bilateral ophthalmic artery origins are within normal limits.  Right posterior communicating artery origin is within normal limits.  Small infundibula of the left anterior choroidal artery and/or diminutive posterior communicating artery.  The carotid termini are within normal limits.  The MCA and ACA origins are within normal limits.  The bilateral MCA branches are within normal limits.  Patent distal vertebral arteries, the left is mildly dominant. There may be an unusual fenestration of the left vertebral artery at the C1 level (  series 604 image 20).  Alternatively, this might be sequelae of a remote dissection.  No distal vertebral artery stenosis.  Mild irregularity of the distal left vertebral artery V4 segment. Severe irregularity of the right distal V4 segment with moderate to severe stenosis just proximal to the vertebrobasilar junction. Therefore, the basilar is primarily supplied from the left.  The basilar artery is  irregular throughout its course with multi focal areas of mild stenosis.  Both AICA vessels are patent and appeared dominant.  SCA and left PCA origins are within normal limits.  Fetal right PCA origin.  Moderate irregularity throughout both PCA branches, with tandem areas of up to moderate stenosis.  There is preserved bilateral distal PCA flow despite this appearance.   Review of the MIP images confirms the above findings.  IMPRESSION: 1.  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  This is likely the source of the acute subarachnoid hemorrhage, see details above. 2.  Interval development of right ACA infarct associated with severe right ACA A2 segment stenosis.  No significant mass effect. No hemorrhagic transformation. 3.  Continued moderate-to-large volume of subarachnoid hemorrhage. 4.  Mildly progressed ventriculomegaly.  Increased intraventricular hemorrhage volume.  No definite transependymal edema. 5.  Moderate to severe posterior circulation atherosclerosis as detailed above.  No major intracranial artery branch occlusion.  Preliminary report called to Dr. Venetia Maxon in the OR at 1523 hours on 08/18/2011.  Original Report Authenticated By: Harley Hallmark, M.D.   Ct Head Wo Contrast  08/19/2011  *RADIOLOGY REPORT*  Clinical Data: 61 year old female with ruptured anterior communicating artery aneurysm status post endovascular coil embolization.  Ventriculostomy place earlier.  CT HEAD WITHOUT CONTRAST  Technique:  Contiguous axial images were obtained from the base of the skull through the vertex without contrast.  Comparison: 0957 hours the same day and earlier.  Findings: Visualized paranasal sinuses and mastoids are clear. Visualized orbit soft tissues are within normal limits.  Stable visualized osseous structures.  Mildly increased right convexity scalp hematoma.  Stable right frontal approach external ventricular drain.  The ventricles are more decompressed.  Mild asymmetric enlargement of the  left lateral ventricle.  Stable volume of intraventricular hemorrhage.  New anterior communicating artery region coil pack with streak artifact.  Diffuse subarachnoid hemorrhage and/or contrast appears mildly increased.  Residual contrast in the major intracranial venous structures.  Inferior right ACA infarct is stable.  No new cortically based infarct is identified.  IMPRESSION: 1.  Interval Acomm aneurysm coiling. 2.  Mildly increased diffuse subarachnoid contrast/hemorrhage. 3.  Stable EVD.  Ventricular system further decompressed.  Stable intraventricular hemorrhage. 4.  Stable inferior right ACA territory infarct. 5.  Mildly increased superficial scalp hematoma on the right.  Original Report Authenticated By: Harley Hallmark, M.D.   Ct Head Wo Contrast  08/19/2011  *RADIOLOGY REPORT*  Clinical Data: Reassess subarachnoid hemorrhage.  CT HEAD WITHOUT CONTRAST  Technique:  Contiguous axial images were obtained from the base of the skull through the vertex without contrast.  Comparison:  Compared 08/18/2011.  Findings:   The patient has had a ventricular catheter placed from a right frontal approach with improved hydrocephalus. The tip of the catheter lies near the septum pellucidum in the right lateral ventricle.  Intraventricular and subarachnoid blood persists. Focal clot left sylvian fissure.  Interhemispheric blood is associated with an acute right ACA territory infarct affecting the right frontal lobe.  IMPRESSION: Improved appearance status post ventricular catheter drainage.  No new subarachnoid blood or  new areas of infarction.  Original Report Authenticated By: Elsie Stain, M.D.   Dg Chest Port 1 View  08/20/2011  *RADIOLOGY REPORT*  Clinical Data: Endotracheal tube.  PORTABLE CHEST - 1 VIEW  Comparison: 08/19/2011  Findings: Endotracheal tube stable. Right PICC stable.  Low volumes.  Basilar atelectasis.  No pneumothorax. NG tube tip is in the fundus of the stomach after placement.  IMPRESSION:  NG tube placed.  Stable basilar atelectasis.  Original Report Authenticated By: Donavan Burnet, M.D.   Dg Chest Port 1 View  08/19/2011  *RADIOLOGY REPORT*  Clinical Data: Intubation.  PORTABLE CHEST - 1 VIEW  Comparison: Plain film chest 08/16/2011.  Findings: The patient has a new endotracheal tube in place with the tip approximately 2.0 cm above the carina. Tip of a new PICC projects at the superior cavoatrial junction.  The lungs appear clear.  Heart size is upper normal.  No pneumothorax or pleural fluid.  IMPRESSION:  1.  ET tube tip is approximately 2 cm above the carina. 2.  Tip of right PICC projects at the superior cavoatrial junction. 3.  Lungs are clear.  Original Report Authenticated By: Bernadene Bell. Maricela Curet, M.D.    Assessment/Plan: SAH and ACom aneurysm s/o angiogram and coiling. (L)Fem Sheath intact-leave in place for now in case art pressures needed. Will report to Dr. Corliss Skains, Follow progress   LOS: 4 days    Brayton El PA-C 08/20/2011 9:04 AM

## 2011-08-20 NOTE — Progress Notes (Signed)
Nutrition Follow-up/consult RD consulted for initiation and management of TF for mechanically ventilated pt.   Intervention:   1. Initiate Osmolite 1.2 @ 20 ml/hr via OGT and increase by 10 ml every 4 hours to goal rate of 30 ml/hr. 30 ml Prostat 5 times daily.  At goal rate, tube feeding regimen will provide 1364 kcal, 115 grams of protein, and 590 ml of H2O. 2.  Multivitamin via tube as this EN regimen does not meet 100% RDI's  3. If no IV fluids, pt will need additional free water fluids to maintain hydration.  4. RD will continue to follow     Assessment:   RD had previously signed off on pt for comfort care. Pt was extubated and showed some improvement. Family decided to pursue more aggressive treatment options, transferred to [facility] for treatment. S/p right frontal IVC placement on 8/15. Intubated for procedure yesterday, cerebral angiogram and primary coiling of aneurysm. Remains intubated at this time, RD consulted to start TF. NG tube tip is in  the fundus of the stomach per cxr report. Propofol during procedure, none running at this time.   Diet Order:  NPO TF: none Temp (24hrs), Avg:100.6 F (38.1 C), Min:99.5 F (37.5 C), Max:102 F (38.9 C) MVe:7.1-12.5 range, most often about 8.8   Meds: Scheduled Meds:   . antiseptic oral rinse  15 mL Mouth Rinse QID  .  ceFAZolin (ANCEF) IV  1 g Intravenous Q8H  . chlorhexidine  15 mL Mouth Rinse BID  . sennosides  10 mL Per Tube BID   And  . docusate  100 mg Oral BID  . insulin aspart  0-15 Units Subcutaneous Q4H  . levetiracetam  500 mg Intravenous Q12H  . midazolam  2-4 mg Intravenous Once  . niMODipine  60 mg Oral Q4H   Or  . niMODipine  60 mg Per Tube Q4H  . nitroGLYCERIN 1.5mg /81ml(25 mcg/ml) - [facility]-IR  1.5 mg Intra-arterial to XRAY  . pantoprazole (PROTONIX) IV  40 mg Intravenous QHS  . potassium chloride  40 mEq Per Tube BID  . verapamil  5 mg Intravenous Once  . DISCONTD: insulin aspart  0-15 Units Subcutaneous Q4H  .  DISCONTD: senna-docusate  1 tablet Oral BID   Continuous Infusions:   . sodium chloride    . sodium chloride 75 mL/hr at 08/20/11 0515  . heparin    . niCARDipine 7 mg/hr (08/20/11 0900)  . DISCONTD: sodium chloride 125 mL/hr at 08/19/11 0700  . DISCONTD: niCARDipine     PRN Meds:.acetaminophen, acetaminophen, acetaminophen-codeine, albuterol, fentaNYL, hydrALAZINE, iohexol, ipratropium, labetalol, midazolam, ondansetron (ZOFRAN) IV, DISCONTD: acetaminophen, DISCONTD: acetaminophen, DISCONTD:  HYDROmorphone (DILAUDID) injection, DISCONTD:  morphine injection, DISCONTD: ondansetron (ZOFRAN) IV, DISCONTD: ondansetron (ZOFRAN) IV  Labs:  CMP     Component Value Date/Time   NA 144 08/20/2011 0600   K 3.0* 08/20/2011 0600   CL 108 08/20/2011 0600   CO2 26 08/20/2011 0600   GLUCOSE 164* 08/20/2011 0600   BUN 22 08/20/2011 0600   CREATININE 1.05 08/20/2011 0600   CALCIUM 8.7 08/20/2011 0600   PROT 7.9 08/18/2011 1336   ALBUMIN 3.0* 08/18/2011 1336   AST 28 08/18/2011 1336   ALT 16 08/18/2011 1336   ALKPHOS 75 08/18/2011 1336   BILITOT 0.6 08/18/2011 1336   GFRNONAA 55* 08/20/2011 0600   GFRAA 64* 08/20/2011 0600   Sodium  Date/Time Value Range Status  08/20/2011  6:00 AM 144  135 - 145 mEq/L Final  08/19/2011  4:30 PM  141  135 - 145 mEq/L Final  08/18/2011  1:36 PM 139  135 - 145 mEq/L Final    Potassium  Date/Time Value Range Status  08/20/2011  6:00 AM 3.0* 3.5 - 5.1 mEq/L Final  08/19/2011  4:30 PM 3.2* 3.5 - 5.1 mEq/L Final  08/18/2011  1:36 PM 3.2* 3.5 - 5.1 mEq/L Final    No results found for this basename: phos    No results found for this basename: mg       Intake/Output Summary (Last 24 hours) at 08/20/11 1052 Last data filed at 08/20/11 1000  Gross per 24 hour  Intake 3412.5 ml  Output   2022 ml  Net 1390.5 ml   Ht: 5'9" Weight Status:  217 lbs, trending down  Body mass index is 32.13 kg/(m^2).  Re-estimated needs:  2043 kcal, (1226-1430 kcal underfeeding goal),  115-130 gm protein   Nutrition Dx:  Inadequate oral intake r/t inability to eat AEB NPO diet status   Goal:  Enteral nutrition to provide 60-70% of estimated calorie needs (22-25 kcals/kg ideal body weight) and >/= 90% of estimated protein needs, based on ASPEN guidelines for permissive underfeeding in critically ill obese individuals.   Monitor:  Tf initiation/tolerance, weight, labs, vent status   Clarene Duke RD, LDN Pager [phone] After Hours pager [phone]

## 2011-08-20 NOTE — Progress Notes (Signed)
PT Cancellation Note  Treatment cancelled today due to medical issues with patient which prohibited therapy. Spoke with RN regarding updated activity orders, pt is to remain on bedrest. Will attempt evaluation tomorrow pending medical stability.  08/20/2011 Milana Kidney DPT PAGER: [phone] OFFICE: [phone]    Milana Kidney 08/20/2011, 9:57 AM

## 2011-08-20 NOTE — Consult Note (Signed)
Name: [name] MRN: [id number] DOB: 24-Feb-1950    LOS: 4  Referring Provider:  Venetia Maxon (nsgy) Reason for Referral:  ICU management   PULMONARY / CRITICAL CARE MEDICINE   Brief Summary:   61 yo with HTN brought to [facility] 8/13 with headache and acute encephalopathy. Intubated.  Admitted for diffuse subarachnoid hemorrhage and aspiration pneumonia.  Due to the extent of the hemorrhage, comfort care was recommended and the patient was extubated for transition to comfort care.   However patient began to show signs of neurologic improvement and family decided to pursue further aggressive care. Transferred to [facility] for possible Neurological intervention.  Interval history:  IR, postop VDRF  Events Since Admit: 8/13  Presented to [facility] with SAH, intubated, extubated to comfort but improved 8/15  Transferred to [facility] 8/16  IR, postop VDRF  Vital Signs: Temp:  [99.5 F (37.5 C)-102 F (38.9 C)] 101.1 F (38.4 C) (08/17 0700) Pulse Rate:  [63-104] 83  (08/17 1006) Resp:  [15-27] 24  (08/17 1006) BP: (102-191)/(55-101) 146/60 mmHg (08/17 1000) SpO2:  [98 %-100 %] 99 % (08/17 1006) Arterial Line BP: (105-209)/(52-151) 139/66 mmHg (08/17 1006) FiO2 (%):  [29.7 %-40.3 %] 30.1 % (08/17 1005) Weight:  [98.7 kg (217 lb 9.5 oz)] 98.7 kg (217 lb 9.5 oz) (08/17 0500)  Physical Examination:  Gen: asleep on vent, arouses to tactile stimulation HEENT: drain in place, c/d/i, no erythema PULM: CTA B CV: RRR, no mgr AB: bS+, soft, nontender Ext: warm, no edema Neuro: somnolent on vent, opens eyes briefly to touch, does not follow   Principal Problem:  *Subarachnoid hemorrhage Active Problems:  Encephalopathy  Aspiration pneumonia  Acute respiratory failure  HTN (hypertension)  ASSESSMENT AND PLAN  PULMONARY  Lab 08/19/11 1448 08/16/11 0758  PHART 7.456* 7.397  PCO2ART 36.2 40.0  PO2ART 70.3* 93.7  HCO3 25.2* 24.1*  O2SAT 93.8 96.5   Ventilator Settings: Vent Mode:  [-] CPAP FiO2 (%):   [29.7 %-40.3 %] 30.1 % Set Rate:  [14 bmp-16 bmp] 16 bmp Vt Set:  [530 mL] 530 mL PEEP:  [5 cmH20-5.2 cmH20] 5 cmH20 Pressure Support:  [10 cmH20] 10 cmH20 Plateau Pressure:  [18 cmH20-24 cmH20] 19 cmH20 CXR:  8/13 >>> Bilateral airspace disease ETT:   8/13>>8/14, 8/16 >>>  A:  Postop VDRF.  Aspiration pneumonia vs pneumonitis? No evidence by CXR. Failed SBT this morning due to low tidal volumes Likely will need trach  P:   Full vent support CXR ABG SBT in AM again 8/18  CARDIOVASCULAR  Lab 08/16/11 0630  TROPONINI <0.30  LATICACIDVEN --  PROBNP --   Lines:   8/16  L Fem Sheath>>> 8/16  R PICC >>>  A:  HTN P:  Per Neurology / Neurosurgery Cardene gtt titrated to SBP 150-160 per NeuroIR  RENAL  Lab 08/20/11 0600 08/19/11 1630 08/18/11 1336 08/16/11 0630  NA 144 141 139 138  K 3.0* 3.2* -- --  CL 108 104 97 100  CO2 26 25 28 24   BUN 22 21 17 17   CREATININE 1.05 0.96 0.85 0.97  CALCIUM 8.7 8.9 9.3 9.0  MG -- -- -- --  PHOS -- -- -- --   Foley:  8/13>>>  A: Hypokalemia.  Normal renal function. P:   Replete K PRN Trend BMP  GASTROINTESTINAL  Lab 08/18/11 1336 08/16/11 0630  AST 28 45*  ALT 16 27  ALKPHOS 75 82  BILITOT 0.6 0.5  PROT 7.9 7.2  ALBUMIN 3.0* 3.3*  A:  No acute issues. P:   Enteral tube feedings today  HEMATOLOGIC  Lab 08/20/11 0600 08/19/11 1630 08/18/11 1336 08/16/11 0630  HGB 10.5* 11.7* 13.5 12.4  HCT 32.9* 36.2 41.0 38.4  PLT 230 285 269 303  INR -- -- 1.13 --  APTT -- -- 32 --   A:  No acute issue. P:  Trend CBC Heparin per Neurology  INFECTIOUS  Lab 08/20/11 0600 08/19/11 1630 08/18/11 1336 08/16/11 0630  WBC 10.7* 14.4* 13.5* 12.4*  PROCALCITON -- -- 0.35 --   Cultures: None  Antibiotics: Unasyn 8/13 >>>  A:  No evidence for aspiration pneumonia but febrile 8/17 with unclear; central? P:   Pan culture Check u/a Keep unasyn today but change or d/c based on today's work up  ENDOCRINE  Lab 08/20/11  0736 08/20/11 0413 08/20/11 0123 08/19/11 2310 08/19/11 2207  GLUCAP 143* 140* 145* 161* 167*    A:  Hyperglycemia. P:   SSI/CBG  NEUROLOGIC  8/16 IVC Drain>>>  A:  Subarachnoid hemorrhage s/p aneurism coiling. Acute encephalopathy. Pain Prognosis?  P:   Per Neurology / Neurosurgery Fentanyl / Versed Discuss prognosis with primary service   BEST PRACTICE / DISPOSITION Level of Care:  ICU Primary Service:  Neurosurgery Consultants:  Neurology, PCCM Code Status:  Full Diet:  Regular DVT Px: SCD's GI Px:  Protonix Skin Integrity:  Intact Social / Family:  Updated at bedside  CC time 30 minutes   Yolonda Kida PCCM Pager: [phone] Cell: [phone] If no response, call [phone]  .

## 2011-08-20 NOTE — Progress Notes (Signed)
Patient ID: [name], female   DOB: 1950/04/22, 61 y.o.   MRN: [id number] Subjective: Patient reports somewhat comatose  Objective: Vital signs in last 24 hours: Temp:  [99.5 F (37.5 C)-102 F (38.9 C)] 101.1 F (38.4 C) (08/17 0700) Pulse Rate:  [57-102] 94  (08/17 0746) Resp:  [15-23] 21  (08/17 0746) BP: (102-191)/(56-101) 124/75 mmHg (08/17 0746) SpO2:  [98 %-100 %] 100 % (08/17 0746) Arterial Line BP: (105-209)/(52-143) 121/75 mmHg (08/17 0700) FiO2 (%):  [29.7 %-40.3 %] 30 % (08/17 0746) Weight:  [98.7 kg (217 lb 9.5 oz)] 98.7 kg (217 lb 9.5 oz) (08/17 0500)  Intake/Output from previous day: 08/16 0701 - 08/17 0700 In: 3327.5 [I.V.:3222.5; IV Piggyback:105] Out: 1822 [Urine:1440; Drains:382] Intake/Output this shift:    eyes open to stim and appears to focus, moves left side greater than right, no attempts to verbalize  Lab Results:  Basename 08/20/11 0600 08/19/11 1630  WBC 10.7* 14.4*  HGB 10.5* 11.7*  HCT 32.9* 36.2  PLT 230 285   BMET  Basename 08/20/11 0600 08/19/11 1630  NA 144 141  K 3.0* 3.2*  CL 108 104  CO2 26 25  GLUCOSE 164* 191*  BUN 22 21  CREATININE 1.05 0.96  CALCIUM 8.7 8.9    Studies/Results: Ct Angio Head W/cm &/or Wo Cm  08/18/2011  *RADIOLOGY REPORT*  Clinical Data:  61 year old female with diffuse subarachnoid hemorrhage.  CT ANGIOGRAPHY HEAD  Technique:  Multidetector CT imaging of the head was performed using the standard protocol during bolus administration of intravenous contrast.  Multiplanar CT image reconstructions including MIPs were obtained to evaluate the vascular anatomy.  Contrast: 80mL OMNIPAQUE IOHEXOL 350 MG/ML SOLN  Comparison:  Head CT without contrast 08/16/2011.  Findings:  Continued widespread hyperdense subarachnoid hemorrhage, there is slightly more volume of hemorrhage to the left of midline as before, but the anterior basilar cistern volume of hemorrhage is not significantly changed.  There is now associated  cytotoxic edema in the right gyrus rectus and cingulate gyrus (right ACA territory).  Increased volume of intraventricular hemorrhage.  Ventriculomegaly which is mildly progressed.  No definite transependymal edema.  Stable gray-white matter differentiation outside of the right ACA territory.  Stable orbit and scalp soft tissues.  Paranasal sinuses and mastoids are clear. No acute osseous abnormality identified.  Vascular Findings: Major intracranial venous structures appear normally enhanced.  There is a saccular, slightly irregular shaped aneurysm arising from the left aspect of the anterior communicating artery directed anteriorly and inferiorly and encompassing 5 x 6 x 5 mm (AP by transverse by CC).  The aneurysm neck measures up to 2-3 mm.   The left ACA A1 segment is dominant.  The left ACA branches remain patent.  A small median artery of the corpus callosum is present and patent.   There is severe narrowing of the distal right ACA A1 segment, in the region of cytotoxic edema noted earlier, but preserved distal right ACA flow in on the left.  Severe tortuosity of the distal left cervical ICA.  Negative distal right cervical ICA.  Both ICA siphons are patent with minimal atherosclerosis and no stenosis.  Bilateral ophthalmic artery origins are within normal limits.  Right posterior communicating artery origin is within normal limits.  Small infundibula of the left anterior choroidal artery and/or diminutive posterior communicating artery.  The carotid termini are within normal limits.  The MCA and ACA origins are within normal limits.  The bilateral MCA branches are within normal limits.  Patent distal vertebral arteries, the left is mildly dominant. There may be an unusual fenestration of the left vertebral artery at the C1 level (series 604 image 20).  Alternatively, this might be sequelae of a remote dissection.  No distal vertebral artery stenosis.  Mild irregularity of the distal left vertebral artery V4  segment. Severe irregularity of the right distal V4 segment with moderate to severe stenosis just proximal to the vertebrobasilar junction. Therefore, the basilar is primarily supplied from the left.  The basilar artery is irregular throughout its course with multi focal areas of mild stenosis.  Both AICA vessels are patent and appeared dominant.  SCA and left PCA origins are within normal limits.  Fetal right PCA origin.  Moderate irregularity throughout both PCA branches, with tandem areas of up to moderate stenosis.  There is preserved bilateral distal PCA flow despite this appearance.   Review of the MIP images confirms the above findings.  IMPRESSION: 1.  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  This is likely the source of the acute subarachnoid hemorrhage, see details above. 2.  Interval development of right ACA infarct associated with severe right ACA A2 segment stenosis.  No significant mass effect. No hemorrhagic transformation. 3.  Continued moderate-to-large volume of subarachnoid hemorrhage. 4.  Mildly progressed ventriculomegaly.  Increased intraventricular hemorrhage volume.  No definite transependymal edema. 5.  Moderate to severe posterior circulation atherosclerosis as detailed above.  No major intracranial artery branch occlusion.  Preliminary report called to Dr. Venetia Maxon in the OR at 1523 hours on 08/18/2011.  Original Report Authenticated By: Harley Hallmark, M.D.   Ct Head Wo Contrast  08/19/2011  *RADIOLOGY REPORT*  Clinical Data: 61 year old female with ruptured anterior communicating artery aneurysm status post endovascular coil embolization.  Ventriculostomy place earlier.  CT HEAD WITHOUT CONTRAST  Technique:  Contiguous axial images were obtained from the base of the skull through the vertex without contrast.  Comparison: 0957 hours the same day and earlier.  Findings: Visualized paranasal sinuses and mastoids are clear. Visualized orbit soft tissues are within normal limits.   Stable visualized osseous structures.  Mildly increased right convexity scalp hematoma.  Stable right frontal approach external ventricular drain.  The ventricles are more decompressed.  Mild asymmetric enlargement of the left lateral ventricle.  Stable volume of intraventricular hemorrhage.  New anterior communicating artery region coil pack with streak artifact.  Diffuse subarachnoid hemorrhage and/or contrast appears mildly increased.  Residual contrast in the major intracranial venous structures.  Inferior right ACA infarct is stable.  No new cortically based infarct is identified.  IMPRESSION: 1.  Interval Acomm aneurysm coiling. 2.  Mildly increased diffuse subarachnoid contrast/hemorrhage. 3.  Stable EVD.  Ventricular system further decompressed.  Stable intraventricular hemorrhage. 4.  Stable inferior right ACA territory infarct. 5.  Mildly increased superficial scalp hematoma on the right.  Original Report Authenticated By: Harley Hallmark, M.D.   Ct Head Wo Contrast  08/19/2011  *RADIOLOGY REPORT*  Clinical Data: Reassess subarachnoid hemorrhage.  CT HEAD WITHOUT CONTRAST  Technique:  Contiguous axial images were obtained from the base of the skull through the vertex without contrast.  Comparison:  Compared 08/18/2011.  Findings:   The patient has had a ventricular catheter placed from a right frontal approach with improved hydrocephalus. The tip of the catheter lies near the septum pellucidum in the right lateral ventricle.  Intraventricular and subarachnoid blood persists. Focal clot left sylvian fissure.  Interhemispheric blood is associated with an acute  right ACA territory infarct affecting the right frontal lobe.  IMPRESSION: Improved appearance status post ventricular catheter drainage.  No new subarachnoid blood or new areas of infarction.  Original Report Authenticated By: Elsie Stain, M.D.   Dg Chest Port 1 View  08/20/2011  *RADIOLOGY REPORT*  Clinical Data: Endotracheal tube.  PORTABLE  CHEST - 1 VIEW  Comparison: 08/19/2011  Findings: Endotracheal tube stable. Right PICC stable.  Low volumes.  Basilar atelectasis.  No pneumothorax. NG tube tip is in the fundus of the stomach after placement.  IMPRESSION: NG tube placed.  Stable basilar atelectasis.  Original Report Authenticated By: Donavan Burnet, M.D.   Dg Chest Port 1 View  08/19/2011  *RADIOLOGY REPORT*  Clinical Data: Intubation.  PORTABLE CHEST - 1 VIEW  Comparison: Plain film chest 08/16/2011.  Findings: The patient has a new endotracheal tube in place with the tip approximately 2.0 cm above the carina. Tip of a new PICC projects at the superior cavoatrial junction.  The lungs appear clear.  Heart size is upper normal.  No pneumothorax or pleural fluid.  IMPRESSION:  1.  ET tube tip is approximately 2 cm above the carina. 2.  Tip of right PICC projects at the superior cavoatrial junction. 3.  Lungs are clear.  Original Report Authenticated By: Bernadene Bell. Maricela Curet, M.D.    Assessment/Plan: Stable at present. Needs aggressive hhh therapy. Will continue present rx. CSF drain working well.  LOS: 4 days  as above   Reinaldo Meeker, MD 08/20/2011, 8:29 AM

## 2011-08-20 NOTE — Progress Notes (Signed)
SLP Cancellation Note  ST received order for SLE on 08/19/11. Evaluation not completed secondary to patient intubated.  ST to follow for POC.  Moreen Fowler MS, CCC-SLP [phone]  [facility] 08/20/2011, 12:52 PM

## 2011-08-21 ENCOUNTER — Inpatient Hospital Stay (HOSPITAL_COMMUNITY): Payer: Medicaid Other

## 2011-08-21 LAB — BLOOD GAS, ARTERIAL
Bicarbonate: 23.5 mEq/L (ref 20.0–24.0)
FIO2: 0.3 %
O2 Saturation: 91.7 %
PEEP: 5 cmH2O
pO2, Arterial: 78.8 mmHg — ABNORMAL LOW (ref 80.0–100.0)

## 2011-08-21 LAB — URINE CULTURE
Colony Count: NO GROWTH
Culture: NO GROWTH

## 2011-08-21 LAB — CBC WITH DIFFERENTIAL/PLATELET
Hemoglobin: 10.5 g/dL — ABNORMAL LOW (ref 12.0–15.0)
Lymphs Abs: 1.8 10*3/uL (ref 0.7–4.0)
MCH: 26.3 pg (ref 26.0–34.0)
Monocytes Relative: 8 % (ref 3–12)
Neutro Abs: 10 10*3/uL — ABNORMAL HIGH (ref 1.7–7.7)
Neutrophils Relative %: 78 % — ABNORMAL HIGH (ref 43–77)
RBC: 4 MIL/uL (ref 3.87–5.11)

## 2011-08-21 LAB — BASIC METABOLIC PANEL
BUN: 23 mg/dL (ref 6–23)
CO2: 23 mEq/L (ref 19–32)
Chloride: 114 mEq/L — ABNORMAL HIGH (ref 96–112)
Glucose, Bld: 163 mg/dL — ABNORMAL HIGH (ref 70–99)
Potassium: 3.3 mEq/L — ABNORMAL LOW (ref 3.5–5.1)

## 2011-08-21 LAB — GLUCOSE, CAPILLARY
Glucose-Capillary: 160 mg/dL — ABNORMAL HIGH (ref 70–99)
Glucose-Capillary: 186 mg/dL — ABNORMAL HIGH (ref 70–99)
Glucose-Capillary: 130 mg/dL — ABNORMAL HIGH (ref 70–99)
Glucose-Capillary: 170 mg/dL — ABNORMAL HIGH (ref 70–99)

## 2011-08-21 MED ORDER — PIPERACILLIN-TAZOBACTAM 3.375 G IVPB
3.3750 g | Freq: Three times a day (TID) | INTRAVENOUS | Status: DC
  Administered 2011-08-21 – 2011-08-25 (×12): 3.375 g via INTRAVENOUS
  Filled 2011-08-21 (×14): qty 50

## 2011-08-21 MED ORDER — SODIUM CHLORIDE 0.9 % IV SOLN
50.0000 ug/h | INTRAVENOUS | Status: DC
  Administered 2011-08-21: 50 ug/h via INTRAVENOUS
  Administered 2011-08-21: 150 ug/h via INTRAVENOUS
  Administered 2011-08-22 – 2011-08-24 (×2): 75 ug/h via INTRAVENOUS
  Administered 2011-08-24: 50 ug/h via INTRAVENOUS
  Administered 2011-08-24: 150 ug/h via INTRAVENOUS
  Administered 2011-08-26: 50 ug/h via INTRAVENOUS
  Filled 2011-08-21 (×7): qty 50

## 2011-08-21 MED ORDER — ACETAMINOPHEN 160 MG/5ML PO SOLN
650.0000 mg | Freq: Four times a day (QID) | ORAL | Status: DC | PRN
  Administered 2011-08-23 – 2011-08-27 (×6): 650 mg
  Filled 2011-08-21 (×7): qty 20.3

## 2011-08-21 MED ORDER — INSULIN GLARGINE 100 UNIT/ML ~~LOC~~ SOLN
10.0000 [IU] | Freq: Two times a day (BID) | SUBCUTANEOUS | Status: DC
  Administered 2011-08-21 – 2011-08-22 (×4): 10 [IU] via SUBCUTANEOUS
  Administered 2011-08-23: 10:00:00 via SUBCUTANEOUS
  Administered 2011-08-23 – 2011-08-26 (×7): 10 [IU] via SUBCUTANEOUS

## 2011-08-21 MED ORDER — VANCOMYCIN HCL 1000 MG IV SOLR
2000.0000 mg | Freq: Once | INTRAVENOUS | Status: AC
  Administered 2011-08-21: 2000 mg via INTRAVENOUS
  Filled 2011-08-21: qty 2000

## 2011-08-21 MED ORDER — VANCOMYCIN HCL IN DEXTROSE 1-5 GM/200ML-% IV SOLN
1000.0000 mg | Freq: Two times a day (BID) | INTRAVENOUS | Status: DC
  Administered 2011-08-22 – 2011-08-23 (×5): 1000 mg via INTRAVENOUS
  Filled 2011-08-21 (×6): qty 200

## 2011-08-21 MED ORDER — POTASSIUM CHLORIDE 20 MEQ/15ML (10%) PO LIQD
40.0000 meq | Freq: Once | ORAL | Status: AC
  Administered 2011-08-21: 40 meq
  Filled 2011-08-21: qty 30

## 2011-08-21 MED ORDER — LABETALOL HCL 100 MG PO TABS
100.0000 mg | ORAL_TABLET | Freq: Three times a day (TID) | ORAL | Status: DC
  Administered 2011-08-21 – 2011-08-24 (×11): 100 mg via ORAL
  Filled 2011-08-21 (×15): qty 1

## 2011-08-21 NOTE — Progress Notes (Signed)
Subjective: Pt remains intubated. Stable overnight, no changes in status  Objective: Physical Exam: BP 170/71  Pulse 124  Temp 102.4 F (39.1 C) (Rectal)  Resp 28  Ht 5\' 9"  (1.753 m)  Wt 234 lb 2.1 oz (106.2 kg)  BMI 34.57 kg/m2  SpO2 92% She does open eyes to stimulus, but does not verbalize or follow commands. (L)fem sheath intact, dressing clean. Legs/feet warm, pulses intact.  Labs: CBC  Basename 08/21/11 0300 08/20/11 0600  WBC 12.9* 10.7*  HGB 10.5* 10.5*  HCT 32.7* 32.9*  PLT 231 230   BMET  Basename 08/21/11 0300 08/20/11 0600  NA 147* 144  K 3.3* 3.0*  CL 114* 108  CO2 23 26  GLUCOSE 163* 164*  BUN 23 22  CREATININE 0.84 1.05  CALCIUM 8.3* 8.7   LFT  Basename 08/18/11 1336  PROT 7.9  ALBUMIN 3.0*  AST 28  ALT 16  ALKPHOS 75  BILITOT 0.6  BILIDIR --  IBILI --  LIPASE --   PT/INR  Basename 08/18/11 1336  LABPROT 14.7  INR 1.13     Studies/Results: Ct Head Wo Contrast  08/19/2011  *RADIOLOGY REPORT*  Clinical Data: 61 year old female with ruptured anterior communicating artery aneurysm status post endovascular coil embolization.  Ventriculostomy place earlier.  CT HEAD WITHOUT CONTRAST  Technique:  Contiguous axial images were obtained from the base of the skull through the vertex without contrast.  Comparison: 0957 hours the same day and earlier.  Findings: Visualized paranasal sinuses and mastoids are clear. Visualized orbit soft tissues are within normal limits.  Stable visualized osseous structures.  Mildly increased right convexity scalp hematoma.  Stable right frontal approach external ventricular drain.  The ventricles are more decompressed.  Mild asymmetric enlargement of the left lateral ventricle.  Stable volume of intraventricular hemorrhage.  New anterior communicating artery region coil pack with streak artifact.  Diffuse subarachnoid hemorrhage and/or contrast appears mildly increased.  Residual contrast in the major intracranial  venous structures.  Inferior right ACA infarct is stable.  No new cortically based infarct is identified.  IMPRESSION: 1.  Interval Acomm aneurysm coiling. 2.  Mildly increased diffuse subarachnoid contrast/hemorrhage. 3.  Stable EVD.  Ventricular system further decompressed.  Stable intraventricular hemorrhage. 4.  Stable inferior right ACA territory infarct. 5.  Mildly increased superficial scalp hematoma on the right.  Original Report Authenticated By: Harley Hallmark, M.D.   Ct Head Wo Contrast  08/19/2011  *RADIOLOGY REPORT*  Clinical Data: Reassess subarachnoid hemorrhage.  CT HEAD WITHOUT CONTRAST  Technique:  Contiguous axial images were obtained from the base of the skull through the vertex without contrast.  Comparison:  Compared 08/18/2011.  Findings:   The patient has had a ventricular catheter placed from a right frontal approach with improved hydrocephalus. The tip of the catheter lies near the septum pellucidum in the right lateral ventricle.  Intraventricular and subarachnoid blood persists. Focal clot left sylvian fissure.  Interhemispheric blood is associated with an acute right ACA territory infarct affecting the right frontal lobe.  IMPRESSION: Improved appearance status post ventricular catheter drainage.  No new subarachnoid blood or new areas of infarction.  Original Report Authenticated By: Elsie Stain, M.D.   Dg Chest Port 1 View  08/21/2011  *RADIOLOGY REPORT*  Clinical Data: Check endotracheal tube.  PORTABLE CHEST - 1 VIEW  Comparison: 08/20/2011  Findings: Endotracheal tube is in place, tip 1.5 cm above carina. Nasogastric tube tip was off the film but beyond the gastroesophageal junction.  Right-sided  PICC line tip overlies the superior vena cava.  The heart is enlarged.  There are no focal consolidations or pleural effusions. Minimal bibasilar atelectasis.  Degenerative changes are seen in the spine.  IMPRESSION:  1.  Cardiomegaly. 2.  Lines and tubes as described.  Original  Report Authenticated By: Patterson Hammersmith, M.D.   Dg Chest Port 1 View  08/20/2011  *RADIOLOGY REPORT*  Clinical Data: Endotracheal tube.  PORTABLE CHEST - 1 VIEW  Comparison: 08/19/2011  Findings: Endotracheal tube stable. Right PICC stable.  Low volumes.  Basilar atelectasis.  No pneumothorax. NG tube tip is in the fundus of the stomach after placement.  IMPRESSION: NG tube placed.  Stable basilar atelectasis.  Original Report Authenticated By: Donavan Burnet, M.D.   Dg Chest Port 1 View  08/19/2011  *RADIOLOGY REPORT*  Clinical Data: Intubation.  PORTABLE CHEST - 1 VIEW  Comparison: Plain film chest 08/16/2011.  Findings: The patient has a new endotracheal tube in place with the tip approximately 2.0 cm above the carina. Tip of a new PICC projects at the superior cavoatrial junction.  The lungs appear clear.  Heart size is upper normal.  No pneumothorax or pleural fluid.  IMPRESSION:  1.  ET tube tip is approximately 2 cm above the carina. 2.  Tip of right PICC projects at the superior cavoatrial junction. 3.  Lungs are clear.  Original Report Authenticated By: Bernadene Bell. Maricela Curet, M.D.    Assessment/Plan: SAH and ACom aneurysm s/o angiogram and coiling. (L)Fem Sheath intact-leave in place for now in case art pressures needed. Will report to Dr. Corliss Skains, Follow progress   LOS: 5 days    Brayton El PA-C 08/21/2011 9:07 AM

## 2011-08-21 NOTE — Progress Notes (Signed)
PT Cancellation Note  Treatment cancelled today due to medical issues with patient which prohibited therapy. Pt is still currently on bedrest and intubated. Will attempt evaluation tomorrow pending medical stability.  Thanks, 08/21/2011 Milana Kidney DPT PAGER: [phone] OFFICE: [phone]    Milana Kidney 08/21/2011, 8:40 AM

## 2011-08-21 NOTE — Progress Notes (Signed)
Name: [name] MRN: [id number] DOB: Nov 24, 1950    LOS: 5  Referring Provider:  Venetia Maxon (nsgy) Reason for Referral:  ICU management   PULMONARY / CRITICAL CARE MEDICINE   Brief Summary:   61 yo with HTN brought to [facility] 8/13 with headache and acute encephalopathy. Intubated.  Admitted for diffuse subarachnoid hemorrhage and aspiration pneumonia.  Due to the extent of the hemorrhage, comfort care was recommended and the patient was extubated for transition to comfort care.   However patient began to show signs of neurologic improvement and family decided to pursue further aggressive care. Transferred to [facility] for possible Neurological intervention.  Interval history:  IR, postop VDRF  Events Since Admit: 8/13  Presented to [facility] with SAH, intubated, extubated to comfort but improved 8/15  Transferred to [facility] 8/16  IR, postop VDRF 8/18  Increased spontaneous movements on L; increased secretions; failed SBT 8/18 AM due to apnea; ongoing fever  Vital Signs: Temp:  [101.2 F (38.4 C)-102.8 F (39.3 C)] 102.4 F (39.1 C) (08/18 0800) Pulse Rate:  [80-140] 124  (08/18 0841) Resp:  [18-32] 28  (08/18 0841) BP: (138-192)/(56-129) 170/71 mmHg (08/18 0800) SpO2:  [92 %-100 %] 92 % (08/18 0841) Arterial Line BP: (105-182)/(63-137) 136/125 mmHg (08/18 0800) FiO2 (%):  [29.4 %-30.5 %] 30 % (08/18 0841) Weight:  [106.2 kg (234 lb 2.1 oz)] 106.2 kg (234 lb 2.1 oz) (08/18 0500)  Physical Examination:  Gen: agitated on vent, arouses to tactile stimulation HEENT: drain in place, c/d/i, no erythema PULM: CTA B CV: RRR, no mgr AB: bS+, soft, nontender Ext: warm, no edema Neuro: agitated, moves L arm spontaneously but not purposefully; does not open eyes to pain; no movement on R side   Principal Problem:  *Subarachnoid hemorrhage Active Problems:  Encephalopathy  Aspiration pneumonia  Acute respiratory failure  HTN (hypertension)  ASSESSMENT AND PLAN  PULMONARY  Lab 08/21/11 0440 08/19/11  1448 08/16/11 0758  PHART 7.314* 7.456* 7.397  PCO2ART 48.6* 36.2 40.0  PO2ART 78.8* 70.3* 93.7  HCO3 23.5 25.2* 24.1*  O2SAT 91.7 93.8 96.5   Ventilator Settings: Vent Mode:  [-] PRVC FiO2 (%):  [29.4 %-30.5 %] 30 % Set Rate:  [16 bmp] 16 bmp Vt Set:  [530 mL] 530 mL PEEP:  [5 cmH20] 5 cmH20 Pressure Support:  [10 cmH20] 10 cmH20 Plateau Pressure:  [18 cmH20-22 cmH20] 21 cmH20 CXR:  8/13 >>> Bilateral airspace disease ETT:   8/13>>8/14, 8/16 >>>  A:  Postop VDRF.  Aspiration pneumonia vs pneumonitis? No evidence by CXR. Failed SBT this morning due to low tidal volumes Likely will need trach  P:   Full vent support again today If neuro status continues to improve then could attempt extubation; otherwise if no change in mental status then would consider tracheostomy this week if family desires aggressive care CXR ABG SBT in AM again 8/19  CARDIOVASCULAR  Lab 08/16/11 0630  TROPONINI <0.30  LATICACIDVEN --  PROBNP --   Lines:   8/16  L Fem Sheath>>> 8/16  R PICC >>>  A:  HTN, still on cardene gtt P:  Per Neurosurgery Advance oral anti-hypertensives today (add labetalol po) Wean cardene gtt  RENAL  Lab 08/21/11 0300 08/20/11 0600 08/19/11 1630 08/18/11 1336 08/16/11 0630  NA 147* 144 141 139 138  K 3.3* 3.0* -- -- --  CL 114* 108 104 97 100  CO2 23 26 25 28 24   BUN 23 22 21 17 17   CREATININE 0.84 1.05 0.96  0.85 0.97  CALCIUM 8.3* 8.7 8.9 9.3 9.0  MG -- -- -- -- --  PHOS -- -- -- -- --   Foley:  8/13>>>  A: Hypokalemia.  Normal renal function. P:   K repleted this AM by [facility] Trend BMP  GASTROINTESTINAL  Lab 08/18/11 1336 08/16/11 0630  AST 28 45*  ALT 16 27  ALKPHOS 75 82  BILITOT 0.6 0.5  PROT 7.9 7.2  ALBUMIN 3.0* 3.3*   A:  No acute issues. P:   Enteral tube feedings today  HEMATOLOGIC  Lab 08/21/11 0300 08/20/11 0600 08/19/11 1630 08/18/11 1336 08/16/11 0630  HGB 10.5* 10.5* 11.7* 13.5 12.4  HCT 32.7* 32.9* 36.2 41.0 38.4  PLT 231  230 285 269 303  INR -- -- -- 1.13 --  APTT -- -- -- 32 --   A:  No acute issue. P:  Trend CBC Heparin per Neurology  INFECTIOUS  Lab 08/21/11 0300 08/20/11 0600 08/19/11 1630 08/18/11 1336 08/16/11 0630  WBC 12.9* 10.7* 14.4* 13.5* 12.4*  PROCALCITON -- -- -- 0.35 --   Cultures: 8/17 blood >> 8/17 urine >>  Antibiotics: Unasyn 8/13 >>>8/15 Cefazolin 8/15 >> 8/18 Vanc 8/18 >> Zosyn 8/18 >>   A:  Fever curve trending up, no clear evidence of infection; ddx UTI? Bacteremia?  P:   Send U/A Add empiric vanc/zosyn while monitoring cultures as fever curve and WBC up Peel back antibiotics per cultures  ENDOCRINE  Lab 08/21/11 0809 08/21/11 0513 08/21/11 0027 08/20/11 2043 08/20/11 1646  GLUCAP 186* 160* 156* 124* 201*    A:  Hyperglycemia. P:   SSI/CBG Add lantus  NEUROLOGIC  8/16 IVC Drain>>>  A:   Subarachnoid hemorrhage s/p aneurism coiling.  Acute encephalopathy, improved 8/18 but still not following commands, protecting airway Prognosis?  P:   Per Neurosurgery Fentanyl / Versed Discuss prognosis, repeat head imaging with NSGY  BEST PRACTICE / DISPOSITION Level of Care:  ICU Primary Service:  Neurosurgery Consultants:  Neurology, PCCM Code Status:  Full Diet:  Regular DVT Px: SCD's GI Px:  Protonix Skin Integrity:  Intact Social / Family:  Updated at bedside  CC time 40 minutes   Yolonda Kida PCCM Pager: [phone] Cell: [phone] If no response, call [phone]  .

## 2011-08-21 NOTE — Progress Notes (Signed)
ANTIBIOTIC CONSULT NOTE - INITIAL  Pharmacy Consult for Vancomycin and Zosyn Indication: fever, possible PNA  Allergies  Allergen Reactions  . Lactose Intolerance (Gi) Other (See Comments)    G.I. Upset    Patient Measurements: Height: 5\' 9"  (175.3 cm) Weight: 234 lb 2.1 oz (106.2 kg) IBW/kg (Calculated) : 66.2   Vital Signs: Temp: 102.4 F (39.1 C) (08/18 0800) Temp src: Rectal (08/18 0800) BP: 170/71 mmHg (08/18 0800) Pulse Rate: 124  (08/18 0841) Intake/Output from previous day: 08/17 0701 - 08/18 0700 In: 4398.6 [I.V.:3593.6; NG/GT:380; IV Piggyback:425] Out: 2240 [Urine:1895; Drains:345] Intake/Output from this shift: Total I/O In: 315 [I.V.:150; NG/GT:60; IV Piggyback:105] Out: 141 [Urine:140; Drains:1]  Labs:  [facility] 08/21/11 0300 08/20/11 0600 08/19/11 1630  WBC 12.9* 10.7* 14.4*  HGB 10.5* 10.5* 11.7*  PLT 231 230 285  LABCREA -- -- --  CREATININE 0.84 1.05 0.96   Estimated Creatinine Clearance: 89 ml/min (by C-G formula based on Cr of 0.84).    Microbiology: Recent Results (from the past 720 hour(s))  MRSA PCR SCREENING     Status: Normal   Collection Time   08/16/11 11:01 AM      Component Value Range Status Comment   MRSA by PCR NEGATIVE  NEGATIVE Final   CULTURE, BLOOD (ROUTINE X 2)     Status: Normal (Preliminary result)   Collection Time   08/20/11 10:45 AM      Component Value Range Status Comment   Specimen Description BLOOD HAND LEFT   Final    Special Requests     Final    Value: BOTTLES DRAWN AEROBIC AND ANAEROBIC 10CC BLUE 5CC RED   Culture  Setup Time 08/20/2011 16:56   Final    Culture     Final    Value:        BLOOD CULTURE RECEIVED NO GROWTH TO DATE CULTURE WILL BE HELD FOR 5 DAYS BEFORE ISSUING A FINAL NEGATIVE REPORT   Report Status PENDING   Incomplete   CULTURE, BLOOD (ROUTINE X 2)     Status: Normal (Preliminary result)   Collection Time   08/20/11 11:40 AM      Component Value Range Status Comment   Specimen Description  BLOOD HAND RIGHT   Final    Special Requests BOTTLES DRAWN AEROBIC ONLY 10CC    Final    Culture  Setup Time 08/20/2011 16:56   Final    Culture     Final    Value:        BLOOD CULTURE RECEIVED NO GROWTH TO DATE CULTURE WILL BE HELD FOR 5 DAYS BEFORE ISSUING A FINAL NEGATIVE REPORT   Report Status PENDING   Incomplete     Medical History: Past Medical History  Diagnosis Date  . Hypertension     Medications:  Scheduled:    . antiseptic oral rinse  15 mL Mouth Rinse QID  . chlorhexidine  15 mL Mouth Rinse BID  . sennosides  10 mL Per Tube BID   And  . docusate  100 mg Oral BID  . feeding supplement (OSMOLITE 1.2 CAL)  1,000 mL Per Tube Q24H  . feeding supplement  30 mL Per Tube 5 X Daily  . insulin aspart  0-15 Units Subcutaneous Q4H  . insulin glargine  10 Units Subcutaneous BID  . labetalol  100 mg Oral TID  . levetiracetam  500 mg Intravenous Q12H  . midazolam  2-4 mg Intravenous Once  . multivitamin  5 mL Oral Daily  .  niMODipine  60 mg Oral Q4H   Or  . niMODipine  60 mg Per Tube Q4H  . pantoprazole (PROTONIX) IV  40 mg Intravenous QHS  . potassium chloride  40 mEq Per Tube BID  . potassium chloride  40 mEq Per Tube Once  . verapamil  5 mg Intravenous Once  . DISCONTD:  ceFAZolin (ANCEF) IV  1 g Intravenous Q8H   Assessment: 61 yo F admitted 8/13 to [facility] with SAH and aspiration PNA.  Pt was intubated and due to the extent of the hemorrhage, family chose to transition to comfort care.  Upon extubation the patient began to show signs of neurologic improvement and the family decided to pursue further aggressive care resulting in transfer to [facility] for further neurologic intervention.   Pt is s/p aneurism coiling in IR 8/16 and remains in VDRF post-op.  Pt has failed SBT and suspect trach will be needed.  Pt continues to have fevers (Tm 102.4) and PCCM has asked that pharmacy begin dosing of Vancomycin and Zosyn for fever vs. aspiration PNA.  Of note, pt received the following  antibiotics at [facility]. Vanc  8/13 Zosyn 8/13 Unasyn 8/15 Ancef 8/15 >> 8/18  Goal of Therapy:  Vancomycin trough level 15-20 mcg/ml Renal dose adjustment of antibiotics  Plan:  Vancomycin 2gm IV x 1 dose (loading dose) Followed by Vancomycin 1gm IV q12h Zosyn 3.375 gm IV q8h (4 hour infusion) Will follow-up renal function, clinical progress, and culture data. Will check a Vancomycin trough at steady state.   [employer], Pharm.D., BCPS Clinical Pharmacist Pager [phone] 08/21/2011 11:02 AM

## 2011-08-21 NOTE — Progress Notes (Signed)
Patient ID: [name], female   DOB: [birth year], 61 y.o.   MRN: [id number] Subjective: Patient reports still comatose  Objective: Vital signs in last 24 hours: Temp:  [101.2 F (38.4 C)-102.8 F (39.3 C)] 102.4 F (39.1 C) (08/18 0800) Pulse Rate:  [77-140] 124  (08/18 0841) Resp:  [18-32] 28  (08/18 0841) BP: (138-192)/(55-129) 170/71 mmHg (08/18 0800) SpO2:  [92 %-100 %] 92 % (08/18 0841) Arterial Line BP: (105-182)/(63-137) 136/125 mmHg (08/18 0800) FiO2 (%):  [29.4 %-30.5 %] 30 % (08/18 0841) Weight:  [106.2 kg (234 lb 2.1 oz)] 106.2 kg (234 lb 2.1 oz) (08/18 0500)  Intake/Output from previous day: 08/17 0701 - 08/18 0700 In: 4398.6 [I.V.:3593.6; NG/GT:380; IV Piggyback:425] Out: 2240 [Urine:1895; Drains:345] Intake/Output this shift: Total I/O In: 210 [I.V.:75; NG/GT:30; IV Piggyback:105] Out: -   no change in neuro exam  Lab Results:  Basename 08/21/11 0300 08/20/11 0600  WBC 12.9* 10.7*  HGB 10.5* 10.5*  HCT 32.7* 32.9*  PLT 231 230   BMET  Basename 08/21/11 0300 08/20/11 0600  NA 147* 144  K 3.3* 3.0*  CL 114* 108  CO2 23 26  GLUCOSE 163* 164*  BUN 23 22  CREATININE 0.84 1.05  CALCIUM 8.3* 8.7    Studies/Results: Ct Head Wo Contrast  08/19/2011  *RADIOLOGY REPORT*  Clinical Data: 61 year old female with ruptured anterior communicating artery aneurysm status post endovascular coil embolization.  Ventriculostomy place earlier.  CT HEAD WITHOUT CONTRAST  Technique:  Contiguous axial images were obtained from the base of the skull through the vertex without contrast.  Comparison: 0957 hours the same day and earlier.  Findings: Visualized paranasal sinuses and mastoids are clear. Visualized orbit soft tissues are within normal limits.  Stable visualized osseous structures.  Mildly increased right convexity scalp hematoma.  Stable right frontal approach external ventricular drain.  The ventricles are more decompressed.  Mild asymmetric enlargement of the  left lateral ventricle.  Stable volume of intraventricular hemorrhage.  New anterior communicating artery region coil pack with streak artifact.  Diffuse subarachnoid hemorrhage and/or contrast appears mildly increased.  Residual contrast in the major intracranial venous structures.  Inferior right ACA infarct is stable.  No new cortically based infarct is identified.  IMPRESSION: 1.  Interval Acomm aneurysm coiling. 2.  Mildly increased diffuse subarachnoid contrast/hemorrhage. 3.  Stable EVD.  Ventricular system further decompressed.  Stable intraventricular hemorrhage. 4.  Stable inferior right ACA territory infarct. 5.  Mildly increased superficial scalp hematoma on the right.  Original Report Authenticated By: Harley Hallmark, M.D.   Ct Head Wo Contrast  08/19/2011  *RADIOLOGY REPORT*  Clinical Data: Reassess subarachnoid hemorrhage.  CT HEAD WITHOUT CONTRAST  Technique:  Contiguous axial images were obtained from the base of the skull through the vertex without contrast.  Comparison:  Compared 08/18/2011.  Findings:   The patient has had a ventricular catheter placed from a right frontal approach with improved hydrocephalus. The tip of the catheter lies near the septum pellucidum in the right lateral ventricle.  Intraventricular and subarachnoid blood persists. Focal clot left sylvian fissure.  Interhemispheric blood is associated with an acute right ACA territory infarct affecting the right frontal lobe.  IMPRESSION: Improved appearance status post ventricular catheter drainage.  No new subarachnoid blood or new areas of infarction.  Original Report Authenticated By: Elsie Stain, M.D.   Dg Chest Port 1 View  08/21/2011  *RADIOLOGY REPORT*  Clinical Data: Check endotracheal tube.  PORTABLE CHEST - 1 VIEW  Comparison: 08/20/2011  Findings: Endotracheal tube is in place, tip 1.5 cm above carina. Nasogastric tube tip was off the film but beyond the gastroesophageal junction.  Right-sided PICC line tip  overlies the superior vena cava.  The heart is enlarged.  There are no focal consolidations or pleural effusions. Minimal bibasilar atelectasis.  Degenerative changes are seen in the spine.  IMPRESSION:  1.  Cardiomegaly. 2.  Lines and tubes as described.  Original Report Authenticated By: Patterson Hammersmith, M.D.   Dg Chest Port 1 View  08/20/2011  *RADIOLOGY REPORT*  Clinical Data: Endotracheal tube.  PORTABLE CHEST - 1 VIEW  Comparison: 08/19/2011  Findings: Endotracheal tube stable. Right PICC stable.  Low volumes.  Basilar atelectasis.  No pneumothorax. NG tube tip is in the fundus of the stomach after placement.  IMPRESSION: NG tube placed.  Stable basilar atelectasis.  Original Report Authenticated By: Donavan Burnet, M.D.   Dg Chest Port 1 View  08/19/2011  *RADIOLOGY REPORT*  Clinical Data: Intubation.  PORTABLE CHEST - 1 VIEW  Comparison: Plain film chest 08/16/2011.  Findings: The patient has a new endotracheal tube in place with the tip approximately 2.0 cm above the carina. Tip of a new PICC projects at the superior cavoatrial junction.  The lungs appear clear.  Heart size is upper normal.  No pneumothorax or pleural fluid.  IMPRESSION:  1.  ET tube tip is approximately 2 cm above the carina. 2.  Tip of right PICC projects at the superior cavoatrial junction. 3.  Lungs are clear.  Original Report Authenticated By: Bernadene Bell. Maricela Curet, M.D.    Assessment/Plan: Staus quo. Continue to push fluids and continue csf drain.  LOS: 5 days  as above   Reinaldo Meeker, MD 08/21/2011, 9:00 AM

## 2011-08-21 NOTE — Progress Notes (Signed)
eLink Physician-Brief Progress Note Patient Name: [name] DOB: [birth year] MRN: [id number]  Date of Service  08/21/2011   HPI/Events of Note  Hypokalemia   eICU Interventions  Potassium replaced   Intervention Category Minor Interventions: Electrolytes abnormality - evaluation and management  DETERDING,ELIZABETH 08/21/2011, 4:13 AM

## 2011-08-22 ENCOUNTER — Inpatient Hospital Stay (HOSPITAL_COMMUNITY): Payer: Medicaid Other

## 2011-08-22 LAB — BASIC METABOLIC PANEL
CO2: 26 mEq/L (ref 19–32)
Calcium: 9 mg/dL (ref 8.4–10.5)
GFR calc Af Amer: 71 mL/min — ABNORMAL LOW (ref >90)
GFR calc non Af Amer: 62 mL/min — ABNORMAL LOW (ref >90)
Sodium: 148 mEq/L — ABNORMAL HIGH (ref 135–145)

## 2011-08-22 LAB — URINALYSIS, ROUTINE W REFLEX MICROSCOPIC
Nitrite: NEGATIVE
Specific Gravity, Urine: 1.018 (ref 1.005–1.030)
Urobilinogen, UA: 0.2 mg/dL (ref 0.0–1.0)

## 2011-08-22 LAB — BLOOD GAS, ARTERIAL
Bicarbonate: 23.9 mEq/L (ref 20.0–24.0)
MECHVT: 530 mL
PEEP: 5 cmH2O
TCO2: 25 mmol/L (ref 0–100)
pCO2 arterial: 39.6 mmHg (ref 35.0–45.0)
pH, Arterial: 7.403 (ref 7.350–7.450)

## 2011-08-22 LAB — CBC WITH DIFFERENTIAL/PLATELET
Basophils Absolute: 0 10*3/uL (ref 0.0–0.1)
Lymphocytes Relative: 11 % — ABNORMAL LOW (ref 12–46)
Lymphs Abs: 2 10*3/uL (ref 0.7–4.0)
MCV: 82.5 fL (ref 78.0–100.0)
Neutro Abs: 14.2 10*3/uL — ABNORMAL HIGH (ref 1.7–7.7)
Neutrophils Relative %: 83 % — ABNORMAL HIGH (ref 43–77)
Platelets: 232 10*3/uL (ref 150–400)
RBC: 4.06 MIL/uL (ref 3.87–5.11)
RDW: 16.8 % — ABNORMAL HIGH (ref 11.5–15.5)
WBC: 17.2 10*3/uL — ABNORMAL HIGH (ref 4.0–10.5)

## 2011-08-22 LAB — URINE MICROSCOPIC-ADD ON

## 2011-08-22 LAB — GLUCOSE, CAPILLARY
Glucose-Capillary: 152 mg/dL — ABNORMAL HIGH (ref 70–99)
Glucose-Capillary: 135 mg/dL — ABNORMAL HIGH (ref 70–99)

## 2011-08-22 MED ORDER — SODIUM CHLORIDE 0.9 % IV BOLUS (SEPSIS)
500.0000 mL | Freq: Once | INTRAVENOUS | Status: AC
  Administered 2011-08-22: 500 mL via INTRAVENOUS

## 2011-08-22 NOTE — Progress Notes (Addendum)
Clinical Social Worker continuing to follow to assist with discharge planning as appropriate. Pt transferred from [facility]. Pt is currently on vent and not appropriate to work with PT/OT at this time in terms of discharge planning. Clinical Social Worker to continue to follow and assist with discharge planning when appropriate.   Jacklynn Lewis, MSW, LCSWA  Clinical Social Work [phone]

## 2011-08-22 NOTE — Progress Notes (Signed)
OT Cancellation Note  Treatment cancelled today due to:  Bedrest with OT/PT orders. OT/ PT to await increased activity orders to complete evaluation.    Harrel Carina [place]   OTR/L Pager: [phone] Office: [phone] .

## 2011-08-22 NOTE — Progress Notes (Signed)
VASCULAR LAB PRELIMINARY  PRELIMINARY  PRELIMINARY  PRELIMINARY  Transcranial Doppler  Date POD PCO2 HCT BP  MCA ACA PCA OPHT SIPH VERT Basilar  8-16- 13 SB     Right  Left   54  64   --  -29   -22  33   13  13   37  26   *  -24     -37    8-19- 13 MS     Right  Left   *  96   *  -99   *  49   11  20   45  41   -19  -20   *           Right  Left                                             Right  Left                                             Right  Left                                            Right  Left                                            Right  Left                                        MCA = Middle Cerebral Artery      OPHT = Opthalmic Artery     BASILAR = Basilar Artery   ACA = Anterior Cerebral Artery     SIPH = Carotid Siphon PCA = Posterior Cerebral Artery   VERT = Verterbral Artery                   Normal MCA = 62+\-12 ACA = 50+\-12 PCA = 42+\-23      *Unable to insonate.  Technically difficult due to the patient's head laying to the right side.   08/22/2011 3:06 PM Gertie Fey, RDMS, RDCS

## 2011-08-22 NOTE — Progress Notes (Signed)
3 Days Post-Op  Subjective: SAH; A COM aneurysm coiling 8/16 On vent No response  Objective: Vital signs in last 24 hours: Temp:  [99.2 F (37.3 C)-102.2 F (39 C)] 99.9 F (37.7 C) (08/19 0800) Pulse Rate:  [60-108] 104  (08/19 1100) Resp:  [17-28] 26  (08/19 1100) BP: (124-203)/(54-157) 203/73 mmHg (08/19 1100) SpO2:  [96 %-100 %] 99 % (08/19 1100) Arterial Line BP: (131-216)/(56-107) 209/84 mmHg (08/19 1100) FiO2 (%):  [29.1 %-31 %] 30.1 % (08/19 1100) Weight:  [230 lb 9.6 oz (104.6 kg)] 230 lb 9.6 oz (104.6 kg) (08/19 0500) Last BM Date: 08/22/11  Intake/Output from previous day: 08/18 0701 - 08/19 0700 In: 4331.3 [I.V.:3243.8; NG/GT:420; IV Piggyback:667.5] Out: 1160 [Urine:930; Drains:230] Intake/Output this shift: Total I/O In: 1035 [I.V.:277.5; NG/GT:120; IV Piggyback:637.5] Out: 774 [Urine:725; Drains:49]  PE:  Low grade temp; wbc increase 203/73 On vent No response Moving all 4s; not to command Agitated Left groin: +art line No bleeding; no hematoma Lt foot 1 + pulse  Lab Results:   Basename 08/22/11 0411 08/21/11 0300  WBC 17.2* 12.9*  HGB 10.5* 10.5*  HCT 33.5* 32.7*  PLT 232 231   BMET  Basename 08/22/11 0411 08/21/11 0300  NA 148* 147*  K 3.3* 3.3*  CL 114* 114*  CO2 26 23  GLUCOSE 156* 163*  BUN 25* 23  CREATININE 0.96 0.84  CALCIUM 9.0 8.3*   PT/INR No results found for this basename: LABPROT:2,INR:2 in the last 72 hours ABG  Basename 08/22/11 0520 08/21/11 0440  PHART 7.403 7.314*  HCO3 23.9 23.5    Studies/Results: Dg Chest Port 1 View  08/22/2011  *RADIOLOGY REPORT*  Clinical Data: Check endotracheal tube  PORTABLE CHEST - 1 VIEW  Comparison: 08/21/2011  Findings: Cardiomediastinal silhouette is stable.  Endotracheal tube in place with tip 12.2 cm above the carina.  Stable NG tube position.  Stable right arm PICC line position.  No acute infiltrate or pulmonary edema.  IMPRESSION: Stable support apparatus.  No active disease.    Original Report Authenticated By: Natasha Mead, M.D. ( 08/22/2011 07:42:14 )    Dg Chest Port 1 View  08/21/2011  *RADIOLOGY REPORT*  Clinical Data: Check endotracheal tube.  PORTABLE CHEST - 1 VIEW  Comparison: 08/20/2011  Findings: Endotracheal tube is in place, tip 1.5 cm above carina. Nasogastric tube tip was off the film but beyond the gastroesophageal junction.  Right-sided PICC line tip overlies the superior vena cava.  The heart is enlarged.  There are no focal consolidations or pleural effusions. Minimal bibasilar atelectasis.  Degenerative changes are seen in the spine.  IMPRESSION:  1.  Cardiomegaly. 2.  Lines and tubes as described.  Original Report Authenticated By: Patterson Hammersmith, M.D.   Ct Portable Head W/o Cm  08/22/2011  *RADIOLOGY REPORT*  Clinical Data: Follow-up subarachnoid hemorrhage.  CT HEAD WITHOUT CONTRAST  Technique:  Contiguous axial images were obtained from the base of the skull through the vertex without contrast.  Study was done with portable technique.  Image quality is reduced. Portions of the brain could not be included on the scan due to the patient shoulders.  Comparison: 08/19/2011.  Findings: A significant portion of the brain, from the tentorium downward, could not be seen on this study.   Repeat portable CT studies not recommended.  Cerebellar and brainstem pathology not excluded.  The degree of subarachnoid hemorrhage is less when compared with 08/16.  Ventricles are well decompressed by right frontal drain catheter.  There  is an evolving hypodensity of the right anterior frontal cortex and subcortical white matter which could represent infarction related to vasospasm.  Coil mass within the region of the anterior communicating artery is redemonstrated.  IMPRESSION: Ventricles remain well decompressed.  Improving subarachnoid hemorrhage.  Evolving right frontal infarct.  A significant portion of the brain could not be included on the portable CT scan due to the  patient's shoulders.  Portable technique not recommended for subsequent follow-up in this patient.   Original Report Authenticated By: Elsie Stain, M.D. ( 08/22/2011 07:33:44 )     Anti-infectives:   Assessment/Plan: s/p Procedure(s) (LRB): RADIOLOGY WITH ANESTHESIA (N/A)  Anterior Comm Artery aneurysm coiling 8/16 On vent No response Movement not to command Will report to Deveshwar   Idalys Konecny A 08/22/2011

## 2011-08-22 NOTE — Progress Notes (Signed)
Have collaborated with this note. 08/22/2011  Ken Lakechia Nay, PT [phone] (pager)  

## 2011-08-22 NOTE — Progress Notes (Signed)
Name: [name] MRN: [id number] DOB: Jan 08, 1950    LOS: 6  Referring Provider:  Venetia Maxon (nsgy) Reason for Referral:  ICU management   PULMONARY / CRITICAL CARE MEDICINE   Brief Summary:   61 yo with HTN Admitted for diffuse subarachnoid hemorrhage and aspiration pneumonia with VDRF.  S/p coil  7x98mm Acom aneurysm  Interval history:  Remains on vent.  Has early vasospasm. Neurosurg desires SBP >/= 190.    Events Since Admit: 8/13  Presented to [facility] with SAH, intubated, extubated to comfort but improved 8/15  Transferred to [facility] 8/16  IR, postop VDRF 8/18  Increased spontaneous movements on L; increased secretions; failed SBT 8/18 AM due to apnea; ongoing fever  Vital Signs: Temp:  [99.2 F (37.3 C)-102.2 F (39 C)] 100 F (37.8 C) (08/19 0500) Pulse Rate:  [60-104] 94  (08/19 0900) Resp:  [17-28] 22  (08/19 0900) BP: (124-199)/(54-157) 193/78 mmHg (08/19 0900) SpO2:  [96 %-100 %] 97 % (08/19 0900) Arterial Line BP: (115-216)/(56-107) 216/84 mmHg (08/19 0730) FiO2 (%):  [29.1 %-31 %] 29.5 % (08/19 0900) Weight:  [104.6 kg (230 lb 9.6 oz)] 104.6 kg (230 lb 9.6 oz) (08/19 0500)  Physical Examination:  Gen: agitated on vent, arouses to tactile stimulation HEENT: drain in place, c/d/i, no erythema PULM: CTA B CV: RRR, no mgr AB: bS+, soft, nontender Ext: warm, no edema Neuro: agitated, moves L arm spontaneously but not purposefully; does not open eyes to pain; no movement on R side   Principal Problem:  *Subarachnoid hemorrhage Active Problems:  Encephalopathy  Aspiration pneumonia  Acute respiratory failure  HTN (hypertension)  ASSESSMENT AND PLAN  PULMONARY  Lab 08/22/11 0520 08/21/11 0440 08/19/11 1448 08/16/11 0758  PHART 7.403 7.314* 7.456* 7.397  PCO2ART 39.6 48.6* 36.2 40.0  PO2ART 89.3 78.8* 70.3* 93.7  HCO3 23.9 23.5 25.2* 24.1*  O2SAT 96.1 91.7 93.8 96.5   Ventilator Settings: Vent Mode:  [-] PSV;CPAP FiO2 (%):  [29.1 %-31 %] 29.5 % Set Rate:   [13 bmp-16 bmp] 13 bmp Vt Set:  [530 mL] 530 mL PEEP:  [5 cmH20] 5 cmH20 Pressure Support:  [5 cmH20] 5 cmH20 Plateau Pressure:  [7 cmH20-29 cmH20] 7 cmH20 CXR:  8/19 >>> resolved airspace dz ETT:   8/13>>8/14, 8/16 >>>  A:  Postop VDRF.  Aspiration pneumonia vs pneumonitis? No evidence by CXR. Failed SBT this morning 8/19  due to low tidal volumes Likely will need trach  P:   Full vent support again today If neuro status continues to improve then could attempt extubation; otherwise if no change in mental status then would consider tracheostomy this week if family desires aggressive care SBT in AM again 8/20   CARDIOVASCULAR  Lab 08/16/11 0630  TROPONINI <0.30  LATICACIDVEN --  PROBNP --   Lines:   8/16  L Fem Sheath>>> 8/16  R PICC >>>  A:  HTN, still on cardene gtt P:  Per Neurosurgery Goal is SBP >190 so d/c cardene drip. Cont labetalol for now   RENAL  Lab 08/22/11 0411 08/21/11 0300 08/20/11 0600 08/19/11 1630 08/18/11 1336  NA 148* 147* 144 141 139  K 3.3* 3.3* -- -- --  CL 114* 114* 108 104 97  CO2 26 23 26 25 28   BUN 25* 23 22 21 17   CREATININE 0.96 0.84 1.05 0.96 0.85  CALCIUM 9.0 8.3* 8.7 8.9 9.3  MG -- -- -- -- --  PHOS -- -- -- -- --   Foley:  8/13>>>  A: Hypokalemia.  Normal renal function. P:   K repleted this AM by [facility] Trend BMP  GASTROINTESTINAL  Lab 08/18/11 1336 08/16/11 0630  AST 28 45*  ALT 16 27  ALKPHOS 75 82  BILITOT 0.6 0.5  PROT 7.9 7.2  ALBUMIN 3.0* 3.3*   A:  No acute issues. P:   Enteral tube feedings  HEMATOLOGIC  Lab 08/22/11 0411 08/21/11 0300 08/20/11 0600 08/19/11 1630 08/18/11 1336  HGB 10.5* 10.5* 10.5* 11.7* 13.5  HCT 33.5* 32.7* 32.9* 36.2 41.0  PLT 232 231 230 285 269  INR -- -- -- -- 1.13  APTT -- -- -- -- 32   A:  No acute issue. P:  Trend CBC SCD  INFECTIOUS  Lab 08/22/11 0411 08/21/11 0300 08/20/11 0600 08/19/11 1630 08/18/11 1336  WBC 17.2* 12.9* 10.7* 14.4* 13.5*  PROCALCITON -- -- --  -- 0.35   Cultures: 8/17 blood >> 8/17 urine >>  Antibiotics: Unasyn 8/13 >>>8/15 Cefazolin 8/15 >> 8/18 Vanc 8/18(asp pna) >> Zosyn 8/18 (asp pna)>>   A:  Fever curve trending up, no clear evidence of infection; ddx UTI? Bacteremia?  P:   Send U/A>>neg except blood, UC neg Cont empiric vanc/zosyn while monitoring cultures as fever curve and WBC up Peel back antibiotics per cultures  ENDOCRINE  Lab 08/22/11 0026 08/21/11 1956 08/21/11 1543 08/21/11 1201 08/21/11 0809  GLUCAP 173* 170* 170* 130* 186*    A:  Hyperglycemia. Better with Lantus and SSI  P:   SSI/CBG Cont  lantus  NEUROLOGIC  8/16 IVC Drain>>>  A:   Subarachnoid hemorrhage s/p aneurism coiling Acom aneurysm Acute encephalopathy, improved 8/18 but still not following commands, protecting airway Prognosis?   Has ischemic areas on R  internal capsule and peripherally R anterior cortex,  P:   Per Neurosurgery Fentanyl / Versed Discuss prognosis,    BEST PRACTICE / DISPOSITION Level of Care:  ICU Primary Service:  Neurosurgery Consultants:  Neurology, PCCM Code Status:  Full Diet:  TF DVT Px: SCD's GI Px:  Protonix Skin Integrity:  Intact Social / Family:  Updated at bedside  CC time 40 minutes   [name]  [phone]  Cell  [phone]  If no response or cell goes to voicemail, call beeper [phone]

## 2011-08-22 NOTE — Progress Notes (Signed)
Subjective: Patient reports on vent, not following commands  Objective: Vital signs in last 24 hours: Temp:  [99.2 F (37.3 C)-102.2 F (39 C)] 100 F (37.8 C) (08/19 0500) Pulse Rate:  [60-124] 99  (08/19 0700) Resp:  [17-32] 23  (08/19 0500) BP: (124-195)/(54-104) 189/78 mmHg (08/19 0700) SpO2:  [92 %-100 %] 98 % (08/19 0700) Arterial Line BP: (115-185)/(56-107) 169/107 mmHg (08/19 0400) FiO2 (%):  [29.5 %-31 %] 29.8 % (08/19 0700) Weight:  [104.6 kg (230 lb 9.6 oz)] 104.6 kg (230 lb 9.6 oz) (08/19 0500)  Intake/Output from previous day: 08/18 0701 - 08/19 0700 In: 4318.8 [I.V.:3243.8; NG/GT:420; IV Piggyback:655] Out: 1133 [Urine:930; Drains:203] Intake/Output this shift:    Physical Exam: On vent, spontaneous movement, right hemiparesis.  Pupils reactive, eyes dysconjugate.  On fentanyl for analgesia, sedation.  Lab Results:  Basename 08/22/11 0411 08/21/11 0300  WBC 17.2* 12.9*  HGB 10.5* 10.5*  HCT 33.5* 32.7*  PLT 232 231   BMET  Basename 08/22/11 0411 08/21/11 0300  NA 148* 147*  K 3.3* 3.3*  CL 114* 114*  CO2 26 23  GLUCOSE 156* 163*  BUN 25* 23  CREATININE 0.96 0.84  CALCIUM 9.0 8.3*    Studies/Results: Dg Chest Port 1 View  08/22/2011  *RADIOLOGY REPORT*  Clinical Data: Check endotracheal tube  PORTABLE CHEST - 1 VIEW  Comparison: 08/21/2011  Findings: Cardiomediastinal silhouette is stable.  Endotracheal tube in place with tip 12.2 cm above the carina.  Stable NG tube position.  Stable right arm PICC line position.  No acute infiltrate or pulmonary edema.  IMPRESSION: Stable support apparatus.  No active disease.   Original Report Authenticated By: Natasha Mead, M.D. ( 08/22/2011 07:42:14 )    Dg Chest Port 1 View  08/21/2011  *RADIOLOGY REPORT*  Clinical Data: Check endotracheal tube.  PORTABLE CHEST - 1 VIEW  Comparison: 08/20/2011  Findings: Endotracheal tube is in place, tip 1.5 cm above carina. Nasogastric tube tip was off the film but beyond the  gastroesophageal junction.  Right-sided PICC line tip overlies the superior vena cava.  The heart is enlarged.  There are no focal consolidations or pleural effusions. Minimal bibasilar atelectasis.  Degenerative changes are seen in the spine.  IMPRESSION:  1.  Cardiomegaly. 2.  Lines and tubes as described.  Original Report Authenticated By: Patterson Hammersmith, M.D.   Ct Portable Head W/o Cm  08/22/2011  *RADIOLOGY REPORT*  Clinical Data: Follow-up subarachnoid hemorrhage.  CT HEAD WITHOUT CONTRAST  Technique:  Contiguous axial images were obtained from the base of the skull through the vertex without contrast.  Study was done with portable technique.  Image quality is reduced. Portions of the brain could not be included on the scan due to the patient shoulders.  Comparison: 08/19/2011.  Findings: A significant portion of the brain, from the tentorium downward, could not be seen on this study.   Repeat portable CT studies not recommended.  Cerebellar and brainstem pathology not excluded.  The degree of subarachnoid hemorrhage is less when compared with 08/16.  Ventricles are well decompressed by right frontal drain catheter.  There is an evolving hypodensity of the right anterior frontal cortex and subcortical white matter which could represent infarction related to vasospasm.  Coil mass within the region of the anterior communicating artery is redemonstrated.  IMPRESSION: Ventricles remain well decompressed.  Improving subarachnoid hemorrhage.  Evolving right frontal infarct.  A significant portion of the brain could not be included on the portable CT scan  due to the patient's shoulders.  Portable technique not recommended for subsequent follow-up in this patient.   Original Report Authenticated By: Elsie Stain, M.D. ( 08/22/2011 07:33:44 )     Assessment/Plan: Head CT incomplete, but stable.  IVC draining well.  Continue to allow BP to be elevated (up to 190) and HHH therapy for presumed vasospasm.   Head CT shows no worsening of existing or new infarcts.  Discussed treatment goals with Dr. Delford Field of [facility].  Use PICC to monitor CVPs.    LOS: 6 days    Dorian Heckle, MD 08/22/2011, 8:24 AM

## 2011-08-22 NOTE — Progress Notes (Signed)
Pt placed back on rate for increased rr

## 2011-08-22 NOTE — Progress Notes (Signed)
OT Discharge Note  Patient is being discharged from OT services secondary to:    Intubated/ sedated   Rn Amy recommends sign off at this time.  Please see latest Therapy Progress Note for current level of functioning and progress toward goals.  Progress and discharge plan and discussed with patient/caregiver and they      Patient unable to participate in discharge planning    Lucile Shutters   OTR/L Pager: [phone] Office: [phone] .

## 2011-08-23 ENCOUNTER — Inpatient Hospital Stay (HOSPITAL_COMMUNITY): Payer: Medicaid Other

## 2011-08-23 LAB — GLUCOSE, CAPILLARY
Glucose-Capillary: 142 mg/dL — ABNORMAL HIGH (ref 70–99)
Glucose-Capillary: 128 mg/dL — ABNORMAL HIGH (ref 70–99)
Glucose-Capillary: 147 mg/dL — ABNORMAL HIGH (ref 70–99)
Glucose-Capillary: 122 mg/dL — ABNORMAL HIGH (ref 70–99)

## 2011-08-23 LAB — BASIC METABOLIC PANEL
BUN: 15 mg/dL (ref 6–23)
CO2: 32 mEq/L (ref 19–32)
Chloride: 108 mEq/L (ref 96–112)
GFR calc non Af Amer: 87 mL/min — ABNORMAL LOW (ref >90)
Glucose, Bld: 137 mg/dL — ABNORMAL HIGH (ref 70–99)
Potassium: 2.7 mEq/L — CL (ref 3.5–5.1)
Sodium: 147 mEq/L — ABNORMAL HIGH (ref 135–145)

## 2011-08-23 LAB — CBC
HCT: 33.2 % — ABNORMAL LOW (ref 36.0–46.0)
Hemoglobin: 10.2 g/dL — ABNORMAL LOW (ref 12.0–15.0)
MCH: 25.4 pg — ABNORMAL LOW (ref 26.0–34.0)
MCHC: 30.7 g/dL (ref 30.0–36.0)
RBC: 4.02 MIL/uL (ref 3.87–5.11)

## 2011-08-23 MED ORDER — POTASSIUM CHLORIDE 10 MEQ/100ML IV SOLN
10.0000 meq | INTRAVENOUS | Status: AC
  Administered 2011-08-23 (×4): 10 meq via INTRAVENOUS
  Filled 2011-08-23: qty 100
  Filled 2011-08-23: qty 300

## 2011-08-23 MED ORDER — LEVETIRACETAM 100 MG/ML PO SOLN
500.0000 mg | Freq: Two times a day (BID) | ORAL | Status: DC
  Administered 2011-08-23 – 2011-09-05 (×26): 500 mg
  Filled 2011-08-23 (×31): qty 5

## 2011-08-23 MED ORDER — PANTOPRAZOLE SODIUM 40 MG PO PACK
40.0000 mg | PACK | Freq: Every day | ORAL | Status: DC
  Administered 2011-08-23 – 2011-09-08 (×15): 40 mg
  Filled 2011-08-23 (×17): qty 20

## 2011-08-23 MED ORDER — POTASSIUM CHLORIDE CRYS ER 20 MEQ PO TBCR
40.0000 meq | EXTENDED_RELEASE_TABLET | ORAL | Status: AC
  Administered 2011-08-23 (×2): 40 meq via ORAL
  Filled 2011-08-23 (×2): qty 2

## 2011-08-23 NOTE — Progress Notes (Signed)
Have collaborated with this note. 08/23/2011  Ken Sahirah Rudell, PT [phone] (pager)  

## 2011-08-23 NOTE — Progress Notes (Signed)
PT Discharge Note  Patient is being discharged from PT services secondary to:    Medical decline- will need to re-order to resume therapy services.  Please see latest Therapy Progress Note for current level of functioning and progress toward goals.  Progress and discharge plan and discussed with patient/caregiver and they    No caregivers available  08/23/2011  Au Sable Bing, PT [phone] (pager)

## 2011-08-23 NOTE — Progress Notes (Signed)
Subjective: Patient reports (sedated, on vent)  Objective: Vital signs in last 24 hours: Temp:  [98.9 F (37.2 C)-101.2 F (38.4 C)] 98.9 F (37.2 C) (08/20 0400) Pulse Rate:  [72-108] 80  (08/20 0800) Resp:  [16-28] 21  (08/20 0800) BP: (102-204)/(51-107) 184/88 mmHg (08/20 0800) SpO2:  [91 %-100 %] 98 % (08/20 0800) Arterial Line BP: (126-209)/(80-110) 126/110 mmHg (08/19 1300) FiO2 (%):  [29.5 %-70.6 %] 30 % (08/20 0800) Weight:  [109.1 kg (240 lb 8.4 oz)] 109.1 kg (240 lb 8.4 oz) (08/20 0432)  Intake/Output from previous day: 08/19 0701 - 08/20 0700 In: 5545 [I.V.:3427.5; NG/GT:690; IV Piggyback:1427.5] Out: 4541 [Urine:4240; Drains:301] Intake/Output this shift:    Sedated after biting tube this am. Prior to sedation: moved all extremities, right side weakness. Not following commands. Pupils equal, sluggish; dysconjugate. Ventric patent.   Lab Results:  Basename 08/23/11 0510 08/22/11 0411  WBC 14.4* 17.2*  HGB 10.2* 10.5*  HCT 33.2* 33.5*  PLT 245 232   BMET  Basename 08/23/11 0510 08/22/11 0411  NA 147* 148*  K 2.7* 3.3*  CL 108 114*  CO2 32 26  GLUCOSE 137* 156*  BUN 15 25*  CREATININE 0.78 0.96  CALCIUM 8.9 9.0    Studies/Results: Dg Chest Port 1 View  08/23/2011  *RADIOLOGY REPORT*  Clinical Data: Check endotracheal tube.  PORTABLE CHEST - 1 VIEW  Comparison: 08/22/2011  Findings: Endotracheal tube appears to be just above the carina but difficult to evaluate.  PICC line tip is probably near the SVC/right atrium junction.  Nasogastric tube extends into the abdomen.  There are low lung volumes and patchy densities in the left hilar region.  Heart size is at probably accentuated by the patient positioning and lung low lung volumes.  IMPRESSION: Low lung volumes with perihilar densities.  Perihilar densities could represent volume loss.  Support apparatuses as described.   Original Report Authenticated By: Richarda Overlie, M.D.    Dg Chest Port 1 View  08/22/2011   *RADIOLOGY REPORT*  Clinical Data: Check endotracheal tube  PORTABLE CHEST - 1 VIEW  Comparison: 08/21/2011  Findings: Cardiomediastinal silhouette is stable.  Endotracheal tube in place with tip 12.2 cm above the carina.  Stable NG tube position.  Stable right arm PICC line position.  No acute infiltrate or pulmonary edema.  IMPRESSION: Stable support apparatus.  No active disease.   Original Report Authenticated By: Natasha Mead, M.D. ( 08/22/2011 07:42:14 )    Ct Portable Head W/o Cm  08/22/2011  *RADIOLOGY REPORT*  Clinical Data: Follow-up subarachnoid hemorrhage.  CT HEAD WITHOUT CONTRAST  Technique:  Contiguous axial images were obtained from the base of the skull through the vertex without contrast.  Study was done with portable technique.  Image quality is reduced. Portions of the brain could not be included on the scan due to the patient shoulders.  Comparison: 08/19/2011.  Findings: A significant portion of the brain, from the tentorium downward, could not be seen on this study.   Repeat portable CT studies not recommended.  Cerebellar and brainstem pathology not excluded.  The degree of subarachnoid hemorrhage is less when compared with 08/16.  Ventricles are well decompressed by right frontal drain catheter.  There is an evolving hypodensity of the right anterior frontal cortex and subcortical white matter which could represent infarction related to vasospasm.  Coil mass within the region of the anterior communicating artery is redemonstrated.  IMPRESSION: Ventricles remain well decompressed.  Improving subarachnoid hemorrhage.  Evolving right frontal infarct.  A significant portion of the brain could not be included on the portable CT scan due to the patient's shoulders.  Portable technique not recommended for subsequent follow-up in this patient.   Original Report Authenticated By: Elsie Stain, M.D. ( 08/22/2011 07:33:44 )     Assessment/Plan: IVC draining well  LOS: 7 days  Continue  support   Georgiann Cocker 08/23/2011, 8:09 AM

## 2011-08-23 NOTE — Progress Notes (Signed)
Nutrition Follow-up RD consulted for initiation and management of TF   Intervention:   1. Continue Osmolite 1.2 @ 30 ml/hr via OGT and 30 ml Prostat 5 times daily.  At goal rate, tube feeding regimen will provide 1364 kcal (75% of her estimated needs), 115 grams of protein (100% of her needs), and 590 ml of H2O. 2.  Continue Multivitamin via tube as this EN regimen does not meet 100% RDI's    Assessment:   RD had previously signed off on pt for comfort care. Pt was extubated and showed some improvement. Family decided to pursue more aggressive treatment options, transferred to [facility] for treatment. S/p right frontal IVC placement on 8/15. Intubated for procedure yesterday, cerebral angiogram and primary coiling of aneurysm.  Pt discussed this morning in rounds.  Diet Order:  NPO  TF: Patient has OGT in place. Osmolite 1.2 is infusing @ 30 ml/hr. 30 ml Prostat via tube 5 times per day. Tube feeding regimen currently providing 1364 kcal, 115 grams protein, and 590 ml H2O.   Residuals: 10 ml  Last bm: 08/22/11  Temp (24hrs), Avg:100 F (37.8 C), Min:98.9 F (37.2 C), Max:101.2 F (38.4 C) MVe:8   Meds: Scheduled Meds:    . antiseptic oral rinse  15 mL Mouth Rinse QID  . chlorhexidine  15 mL Mouth Rinse BID  . sennosides  10 mL Per Tube BID   And  . docusate  100 mg Oral BID  . feeding supplement (OSMOLITE 1.2 CAL)  1,000 mL Per Tube Q24H  . feeding supplement  30 mL Per Tube 5 X Daily  . insulin aspart  0-15 Units Subcutaneous Q4H  . insulin glargine  10 Units Subcutaneous BID  . labetalol  100 mg Oral TID  . levETIRAcetam  500 mg Per Tube BID  . midazolam  2-4 mg Intravenous Once  . multivitamin  5 mL Oral Daily  . niMODipine  60 mg Per Tube Q4H  . pantoprazole sodium  40 mg Per Tube Q1200  . piperacillin-tazobactam (ZOSYN)  IV  3.375 g Intravenous Q8H  . potassium chloride  10 mEq Intravenous Q1 Hr x 4  . potassium chloride  40 mEq Oral Q4H  . vancomycin  1,000 mg  Intravenous Q12H  . DISCONTD: levetiracetam  500 mg Intravenous Q12H  . DISCONTD: niMODipine  60 mg Oral Q4H  . DISCONTD: pantoprazole (PROTONIX) IV  40 mg Intravenous QHS   Continuous Infusions:    . sodium chloride 75 mL/hr at 08/23/11 1100  . fentaNYL infusion INTRAVENOUS 75 mcg/hr (08/22/11 2210)   PRN Meds:.acetaminophen (TYLENOL) oral liquid 160 mg/5 mL, albuterol, fentaNYL, ipratropium, midazolam, ondansetron (ZOFRAN) IV  Labs:  CMP     Component Value Date/Time   NA 147* 08/23/2011 0510   K 2.7* 08/23/2011 0510   CL 108 08/23/2011 0510   CO2 32 08/23/2011 0510   GLUCOSE 137* 08/23/2011 0510   BUN 15 08/23/2011 0510   CREATININE 0.78 08/23/2011 0510   CALCIUM 8.9 08/23/2011 0510   PROT 7.9 08/18/2011 1336   ALBUMIN 3.0* 08/18/2011 1336   AST 28 08/18/2011 1336   ALT 16 08/18/2011 1336   ALKPHOS 75 08/18/2011 1336   BILITOT 0.6 08/18/2011 1336   GFRNONAA 87* 08/23/2011 0510   GFRAA >90 08/23/2011 0510   Sodium  Date/Time Value Range Status  08/23/2011  5:10 AM 147* 135 - 145 mEq/L Final  08/22/2011  4:11 AM 148* 135 - 145 mEq/L Final  08/21/2011  3:00 AM 147* 135 -  145 mEq/L Final    Potassium  Date/Time Value Range Status  08/23/2011  5:10 AM 2.7* 3.5 - 5.1 mEq/L Final     CRITICAL RESULT CALLED TO, READ BACK BY AND VERIFIED WITH:     Wonda Cheng [id number] [facility]  08/22/2011  4:11 AM 3.3* 3.5 - 5.1 mEq/L Final  08/21/2011  3:00 AM 3.3* 3.5 - 5.1 mEq/L Final    No results found for this basename: phos    No results found for this basename: mg     Intake/Output Summary (Last 24 hours) at 08/23/11 1256 Last data filed at 08/23/11 1100  Gross per 24 hour  Intake 4147.5 ml  Output   4237 ml  Net  -89.5 ml   Ht: 5'9" Weight Status:  240 lbs, trending up; 221 lbs on admission Body mass index is 35.52 kg/(m^2).   Re-estimated needs:  1815 kcal, 115-130 gm protein   Nutrition Dx:  Inadequate oral intake r/t inability to eat AEB NPO diet status   Goal:  Enteral  nutrition to provide 60-70% of estimated calorie needs (22-25 kcals/kg ideal body weight) and >/= 90% of estimated protein needs, based on ASPEN guidelines for permissive underfeeding in critically ill obese individuals; met.   Monitor:  Tf initiation/tolerance, weight, labs, vent status   Kendell Bane RD, LDN, CNSC [phone] Pager [phone] After Hours Pager

## 2011-08-23 NOTE — Progress Notes (Signed)
eLink Physician-Brief Progress Note Patient Name: [name] DOB: [birth year] MRN: [id number]  Date of Service  08/23/2011   HPI/Events of Note   Low k   eICU Interventions  Replace Eric Form. 08/23/2011, 6:36 AM

## 2011-08-23 NOTE — Progress Notes (Signed)
4 Days Post-Op  Subjective:  Remains slightly sedated on vent, family present.   Objective: Vital signs in last 24 hours: Temp:  [98.9 F (37.2 C)-101.2 F (38.4 C)] 98.9 F (37.2 C) (08/20 0800) Pulse Rate:  [71-98] 71  (08/20 1100) Resp:  [16-28] 19  (08/20 1100) BP: (102-204)/(51-152) 193/82 mmHg (08/20 1100) SpO2:  [91 %-100 %] 98 % (08/20 1100) Arterial Line BP: (126-177)/(87-110) 126/110 mmHg (08/19 1300) FiO2 (%):  [29.6 %-70.6 %] 30.2 % (08/20 1100) Weight:  [240 lb 8.4 oz (109.1 kg)] 240 lb 8.4 oz (109.1 kg) (08/20 0432) Last BM Date: 08/22/11  Intake/Output from previous day: 08/19 0701 - 08/20 0700 In: 5545 [I.V.:3427.5; NG/GT:690; IV Piggyback:1427.5] Out: 4541 [Urine:4240; Drains:301] Intake/Output this shift: Total I/O In: 525 [I.V.:405; NG/GT:120] Out: 695 [Urine:650; Drains:45]  Physical exam : Ventilated, IVC clearing - blood tinged. Easily agitated during exam.  Moves all four extremities to tactile stimulation. Bilateral shoulder shrugs appear equal.  Mild LE edema.  Groin site clean and dry.  BP's remain within goal.   Lab Results:   Basename 08/23/11 0510 08/22/11 0411  WBC 14.4* 17.2*  HGB 10.2* 10.5*  HCT 33.2* 33.5*  PLT 245 232   BMET  Basename 08/23/11 0510 08/22/11 0411  NA 147* 148*  K 2.7* 3.3*  CL 108 114*  CO2 32 26  GLUCOSE 137* 156*  BUN 15 25*  CREATININE 0.78 0.96  CALCIUM 8.9 9.0   ABG  Basename 08/22/11 0520 08/21/11 0440  PHART 7.403 7.314*  HCO3 23.9 23.5    Studies/Results: Dg Chest Port 1 View  08/23/2011  *RADIOLOGY REPORT*  Clinical Data: Check endotracheal tube.  PORTABLE CHEST - 1 VIEW  Comparison: 08/22/2011  Findings: Endotracheal tube appears to be just above the carina but difficult to evaluate.  PICC line tip is probably near the SVC/right atrium junction.  Nasogastric tube extends into the abdomen.  There are low lung volumes and patchy densities in the left hilar region.  Heart size is at probably  accentuated by the patient positioning and lung low lung volumes.  IMPRESSION: Low lung volumes with perihilar densities.  Perihilar densities could represent volume loss.  Support apparatuses as described.   Original Report Authenticated By: Richarda Overlie, M.D.    Dg Chest Port 1 View  08/22/2011  *RADIOLOGY REPORT*  Clinical Data: Check endotracheal tube  PORTABLE CHEST - 1 VIEW  Comparison: 08/21/2011  Findings: Cardiomediastinal silhouette is stable.  Endotracheal tube in place with tip 12.2 cm above the carina.  Stable NG tube position.  Stable right arm PICC line position.  No acute infiltrate or pulmonary edema.  IMPRESSION: Stable support apparatus.  No active disease.   Original Report Authenticated By: Natasha Mead, M.D. ( 08/22/2011 07:42:14 )    Ct Portable Head W/o Cm  08/22/2011  *RADIOLOGY REPORT*  Clinical Data: Follow-up subarachnoid hemorrhage.  CT HEAD WITHOUT CONTRAST  Technique:  Contiguous axial images were obtained from the base of the skull through the vertex without contrast.  Study was done with portable technique.  Image quality is reduced. Portions of the brain could not be included on the scan due to the patient shoulders.  Comparison: 08/19/2011.  Findings: A significant portion of the brain, from the tentorium downward, could not be seen on this study.   Repeat portable CT studies not recommended.  Cerebellar and brainstem pathology not excluded.  The degree of subarachnoid hemorrhage is less when compared with 08/16.  Ventricles are well decompressed by  right frontal drain catheter.  There is an evolving hypodensity of the right anterior frontal cortex and subcortical white matter which could represent infarction related to vasospasm.  Coil mass within the region of the anterior communicating artery is redemonstrated.  IMPRESSION: Ventricles remain well decompressed.  Improving subarachnoid hemorrhage.  Evolving right frontal infarct.  A significant portion of the brain could not be  included on the portable CT scan due to the patient's shoulders.  Portable technique not recommended for subsequent follow-up in this patient.   Original Report Authenticated By: Elsie Stain, M.D. ( 08/22/2011 07:33:44 )     Anti-infectives: Anti-infectives     Start     Dose/Rate Route Frequency Ordered Stop   08/22/11 0000   vancomycin (VANCOCIN) IVPB 1000 mg/200 mL premix        1,000 mg 200 mL/hr over 60 Minutes Intravenous Every 12 hours 08/21/11 1103     08/21/11 1400  piperacillin-tazobactam (ZOSYN) IVPB 3.375 g       3.375 g 12.5 mL/hr over 240 Minutes Intravenous 3 times per day 08/21/11 1103     08/21/11 1200   vancomycin (VANCOCIN) 2,000 mg in sodium chloride 0.9 % 500 mL IVPB        2,000 mg 250 mL/hr over 120 Minutes Intravenous  Once 08/21/11 1103 08/21/11 1342   08/18/11 2200   ceFAZolin (ANCEF) IVPB 1 g/50 mL premix  Status:  Discontinued        1 g 100 mL/hr over 30 Minutes Intravenous 3 times per day 08/18/11 2034 08/21/11 1028   08/18/11 0830   ampicillin-sulbactam (UNASYN) 1.5 g in sodium chloride 0.9 % 50 mL IVPB  Status:  Discontinued        1.5 g 100 mL/hr over 30 Minutes Intravenous Every 6 hours 08/18/11 0816 08/18/11 2216   08/16/11 1200   piperacillin-tazobactam (ZOSYN) IVPB 3.375 g  Status:  Discontinued        3.375 g 12.5 mL/hr over 240 Minutes Intravenous Every 8 hours 08/16/11 1108 08/16/11 1433   08/16/11 1200   vancomycin (VANCOCIN) 1,250 mg in sodium chloride 0.9 % 250 mL IVPB  Status:  Discontinued        1,250 mg 166.7 mL/hr over 90 Minutes Intravenous Every 12 hours 08/16/11 1108 08/16/11 1433          Assessment/Plan: SAH s/p aneurysm coiling.  Remains ventilated. Movements better today of all four extremities.  For attempts at weaning soon.  TCD's tomorrow per RN. BP's within goal.  NIR to continue to follow.    LOS: 7 days    CAMPBELL,PAMELA D 08/23/2011

## 2011-08-23 NOTE — Progress Notes (Signed)
Name: [name] MRN: [id number] DOB: December 01, 1950    LOS: 7  Referring Provider:  Venetia Maxon (nsgy) Reason for Referral:  ICU management   PULMONARY / CRITICAL CARE MEDICINE   Brief Summary:   61 yo with HTN Admitted for diffuse subarachnoid hemorrhage and aspiration pneumonia with VDRF.  S/p coil  7x45mm Acom aneurysm  Interval history:  Remains on vent. BP is within goal.  I>>O for vasospasm  Events Since Admit: 8/13  Presented to [facility] with SAH, intubated, extubated to comfort but improved 8/15  Transferred to [facility] 8/16  IR, postop VDRF 8/18  Increased spontaneous movements on L; increased secretions; failed SBT 8/18 AM due to apnea; ongoing fever  Vital Signs: Temp:  [98.9 F (37.2 C)-101.2 F (38.4 C)] 98.9 F (37.2 C) (08/20 0400) Pulse Rate:  [72-108] 80  (08/20 0800) Resp:  [16-28] 21  (08/20 0800) BP: (102-204)/(51-107) 184/88 mmHg (08/20 0800) SpO2:  [91 %-100 %] 98 % (08/20 0800) Arterial Line BP: (126-209)/(80-110) 126/110 mmHg (08/19 1300) FiO2 (%):  [29.6 %-70.6 %] 30 % (08/20 0800) Weight:  [109.1 kg (240 lb 8.4 oz)] 109.1 kg (240 lb 8.4 oz) (08/20 0432)  Physical Examination: Gen: agitated on vent, arouses to tactile stimulation HEENT: drain in place, c/d/i, no erythema PULM: scattered rhonchi CV: RRR, no mgr AB: bS+, soft, nontender Ext: warm, 2+edema Neuro: agitated, moves L arm spontaneously but not purposefully; does not open eyes to pain; no movement on R side   Principal Problem:  *Subarachnoid hemorrhage Active Problems:  Encephalopathy  Aspiration pneumonia  Acute respiratory failure  HTN (hypertension)  ASSESSMENT AND PLAN  PULMONARY  Lab 08/22/11 0520 08/21/11 0440 08/19/11 1448  PHART 7.403 7.314* 7.456*  PCO2ART 39.6 48.6* 36.2  PO2ART 89.3 78.8* 70.3*  HCO3 23.9 23.5 25.2*  O2SAT 96.1 91.7 93.8   Ventilator Settings: Vent Mode:  [-] PSV;CPAP FiO2 (%):  [29.6 %-70.6 %] 30 % Set Rate:  [16 bmp] 16 bmp Vt Set:  [530 mL] 530  mL PEEP:  [5 cmH20] 5 cmH20 Pressure Support:  [5 cmH20-10 cmH20] 5 cmH20 Plateau Pressure:  [6 cmH20-23 cmH20] 23 cmH20 CXR:  8/20 >>>perihilar ATX ETT:   8/13>>8/14, 8/16 >>>  A:  Postop VDRF.  Aspiration pneumonia vs pneumonitis? No evidence by CXR. Failed SBT this morning 8/19  due to low tidal volumes Likely will need trach  P:   Full vent support again today, Doing ok on PSV but will need airway for now, no extubation.    CARDIOVASCULAR No results found for this basename: TROPONINI:5,LATICACIDVEN:5, O2SATVEN:5,PROBNP:5 in the last 168 hours Lines:   8/16  L Fem Sheath>>>8/19 8/16  R PICC >>>  A:  HTN, still on cardene gtt P:  Per Neurosurgery Goal is SBP 200-140  Cont labetalol for now   RENAL  Lab 08/23/11 0510 08/22/11 0411 08/21/11 0300 08/20/11 0600 08/19/11 1630  NA 147* 148* 147* 144 141  K 2.7* 3.3* -- -- --  CL 108 114* 114* 108 104  CO2 32 26 23 26 25   BUN 15 25* 23 22 21   CREATININE 0.78 0.96 0.84 1.05 0.96  CALCIUM 8.9 9.0 8.3* 8.7 8.9  MG -- -- -- -- --  PHOS -- -- -- -- --   Foley:  8/13>>>  A: Hypokalemia.  Normal renal function. P:   K repleted this AM by ELINK Trend BMP  GASTROINTESTINAL  Lab 08/18/11 1336  AST 28  ALT 16  ALKPHOS 75  BILITOT 0.6  PROT  7.9  ALBUMIN 3.0*   A:  No acute issues. P:   Enteral tube feedings  HEMATOLOGIC  Lab 08/23/11 0510 08/22/11 0411 08/21/11 0300 08/20/11 0600 08/19/11 1630 08/18/11 1336  HGB 10.2* 10.5* 10.5* 10.5* 11.7* --  HCT 33.2* 33.5* 32.7* 32.9* 36.2 --  PLT 245 232 231 230 285 --  INR -- -- -- -- -- 1.13  APTT -- -- -- -- -- 32   A:  No acute issue. P:  Trend CBC SCD  INFECTIOUS  Lab 08/23/11 0510 08/22/11 0411 08/21/11 0300 08/20/11 0600 08/19/11 1630 08/18/11 1336  WBC 14.4* 17.2* 12.9* 10.7* 14.4* --  PROCALCITON -- -- -- -- -- 0.35   Cultures: 8/17 blood >>neg 8/17 urine >>neg 8/20 resp c/s>>  Antibiotics: Unasyn 8/13 >>>8/15 Cefazolin 8/15 >> 8/18 Vanc 8/18(asp  pna) >> Zosyn 8/18 (asp pna)>>   A:  Fever curve trending up, no clear evidence of infection; ?d/t to CNS blood, WBC is better P:    Cont empiric vanc/zosyn while monitoring cultures as fever curve and WBC up Narrow ABX when c/s finalized  ENDOCRINE  Lab 08/23/11 0833 08/22/11 2340 08/22/11 2004 08/22/11 1559 08/22/11 1325  GLUCAP 142* 132* 179* 135* 129*    A:  Hyperglycemia. Better with Lantus and SSI  P:   SSI/CBG Cont  lantus  NEUROLOGIC  8/16 IVC Drain>>>  A:   Subarachnoid hemorrhage s/p aneurism coiling Acom aneurysm Acute encephalopathy, improved 8/18 but still not following commands, protecting airway Prognosis?   Has ischemic areas on R  internal capsule and peripherally R anterior cortex,  P:   Per Neurosurgery Fentanyl / Versed     BEST PRACTICE / DISPOSITION Level of Care:  ICU Primary Service:  Neurosurgery Consultants:  Neurology, PCCM Code Status:  Full Diet:  TF DVT Px: SCD's GI Px:  Protonix Skin Integrity:  Intact Social / Family:  Updated at bedside  CC time 35 minutes   [name]  [phone]  Cell  [phone]  If no response or cell goes to voicemail, call beeper [phone]

## 2011-08-23 NOTE — Progress Notes (Signed)
Clinical Social Work: Coverage for [address]:  Will sign off at this point due to patient being on vent and unsure of needs.  When needs arise and appropriate please re consult CSW and will assist with dc planning.  CSW signed off.  Ashley Jacobs, MSW LCSW  For [employer] [phone]

## 2011-08-23 NOTE — Progress Notes (Signed)
Continue with HHH therapy.

## 2011-08-24 ENCOUNTER — Inpatient Hospital Stay (HOSPITAL_COMMUNITY): Payer: Medicaid Other

## 2011-08-24 DIAGNOSIS — I739 Peripheral vascular disease, unspecified: Secondary | ICD-10-CM | POA: Diagnosis present

## 2011-08-24 LAB — CBC
MCH: 26.1 pg (ref 26.0–34.0)
MCV: 81.7 fL (ref 78.0–100.0)
Platelets: 229 10*3/uL (ref 150–400)
RBC: 3.87 MIL/uL (ref 3.87–5.11)
RDW: 15.7 % — ABNORMAL HIGH (ref 11.5–15.5)
WBC: 11.3 10*3/uL — ABNORMAL HIGH (ref 4.0–10.5)

## 2011-08-24 LAB — BASIC METABOLIC PANEL
BUN: 14 mg/dL (ref 6–23)
BUN: 15 mg/dL (ref 6–23)
CO2: 34 mEq/L — ABNORMAL HIGH (ref 19–32)
Calcium: 8.9 mg/dL (ref 8.4–10.5)
Chloride: 101 mEq/L (ref 96–112)
Creatinine, Ser: 0.77 mg/dL (ref 0.50–1.10)
Creatinine, Ser: 0.81 mg/dL (ref 0.50–1.10)
GFR calc Af Amer: >90 mL/min (ref >90)
GFR calc Af Amer: 88 mL/min — ABNORMAL LOW (ref >90)
GFR calc non Af Amer: 88 mL/min — ABNORMAL LOW (ref >90)
GFR calc non Af Amer: 76 mL/min — ABNORMAL LOW (ref >90)
Glucose, Bld: 149 mg/dL — ABNORMAL HIGH (ref 70–99)
Glucose, Bld: 193 mg/dL — ABNORMAL HIGH (ref 70–99)
Potassium: 2.8 mEq/L — ABNORMAL LOW (ref 3.5–5.1)
Sodium: 142 mEq/L (ref 135–145)

## 2011-08-24 LAB — GLUCOSE, CAPILLARY: Glucose-Capillary: 142 mg/dL — ABNORMAL HIGH (ref 70–99)

## 2011-08-24 MED ORDER — BIOTENE DRY MOUTH MT LIQD
15.0000 mL | OROMUCOSAL | Status: DC
  Administered 2011-08-24 – 2011-08-27 (×33): 15 mL via OROMUCOSAL

## 2011-08-24 MED ORDER — NIMODIPINE 60 MG/20ML PO SOLN
60.0000 mg | ORAL | Status: DC
  Administered 2011-08-24: 11:00:00 via ORAL
  Administered 2011-08-24 (×2): 60 mg via ORAL
  Administered 2011-08-24: 18:00:00 via ORAL
  Administered 2011-08-25 – 2011-08-27 (×15): 60 mg via ORAL
  Filled 2011-08-24 (×25): qty 20

## 2011-08-24 MED ORDER — POTASSIUM CHLORIDE CRYS ER 20 MEQ PO TBCR
40.0000 meq | EXTENDED_RELEASE_TABLET | ORAL | Status: AC
  Administered 2011-08-24 (×2): 40 meq via ORAL
  Filled 2011-08-24 (×2): qty 2

## 2011-08-24 NOTE — Progress Notes (Signed)
Pt still intubated, SLp will sign off. Please reorder when appropriate. Harlon Ditty, MA CCC-SLP [phone]

## 2011-08-24 NOTE — Progress Notes (Signed)
ANTIBIOTIC CONSULT NOTE - FOLLOW UP  Pharmacy Consult for Zosyn Indication: aspiration PNA  Allergies  Allergen Reactions  . Lactose Intolerance (Gi) Other (See Comments)    G.I. Upset    Patient Measurements: Height: 5\' 9"  (175.3 cm) Weight: 238 lb 5.1 oz (108.1 kg) IBW/kg (Calculated) : 66.2  Adjusted Body Weight:   Vital Signs: Temp: 100.1 F (37.8 C) (08/21 0800) Temp src: Rectal (08/21 0800) BP: 154/88 mmHg (08/21 0919) Pulse Rate: 104  (08/21 0919) Intake/Output from previous day: 08/20 0701 - 08/21 0700 In: 3421.3 [I.V.:2181.3; NG/GT:690; IV Piggyback:550] Out: 3726 [Urine:3425; Drains:301] Intake/Output from this shift: Total I/O In: 7.5 [I.V.:7.5] Out: -   Labs:  Basename 08/24/11 0430 08/23/11 0510 08/22/11 0411  WBC 11.3* 14.4* 17.2*  HGB 10.1* 10.2* 10.5*  PLT 229 245 232  LABCREA -- -- --  CREATININE 0.77 0.78 0.96   Estimated Creatinine Clearance: 94.3 ml/min (by C-G formula based on Cr of 0.77). No results found for this basename: VANCOTROUGH:2,VANCOPEAK:2,VANCORANDOM:2,GENTTROUGH:2,GENTPEAK:2,GENTRANDOM:2,TOBRATROUGH:2,TOBRAPEAK:2,TOBRARND:2,AMIKACINPEAK:2,AMIKACINTROU:2,AMIKACIN:2, in the last 72 hours   Microbiology: Recent Results (from the past 720 hour(s))  MRSA PCR SCREENING     Status: Normal   Collection Time   08/16/11 11:01 AM      Component Value Range Status Comment   MRSA by PCR NEGATIVE  NEGATIVE Final   CULTURE, BLOOD (ROUTINE X 2)     Status: Normal (Preliminary result)   Collection Time   08/20/11 10:45 AM      Component Value Range Status Comment   Specimen Description BLOOD HAND LEFT   Final    Special Requests     Final    Value: BOTTLES DRAWN AEROBIC AND ANAEROBIC 10CC BLUE 5CC RED   Culture  Setup Time 08/20/2011 16:56   Final    Culture     Final    Value:        BLOOD CULTURE RECEIVED NO GROWTH TO DATE CULTURE WILL BE HELD FOR 5 DAYS BEFORE ISSUING A FINAL NEGATIVE REPORT   Report Status PENDING   Incomplete     CULTURE, BLOOD (ROUTINE X 2)     Status: Normal (Preliminary result)   Collection Time   08/20/11 11:40 AM      Component Value Range Status Comment   Specimen Description BLOOD HAND RIGHT   Final    Special Requests BOTTLES DRAWN AEROBIC ONLY 10CC    Final    Culture  Setup Time 08/20/2011 16:56   Final    Culture     Final    Value:        BLOOD CULTURE RECEIVED NO GROWTH TO DATE CULTURE WILL BE HELD FOR 5 DAYS BEFORE ISSUING A FINAL NEGATIVE REPORT   Report Status PENDING   Incomplete   URINE CULTURE     Status: Normal   Collection Time   08/20/11  1:13 PM      Component Value Range Status Comment   Specimen Description URINE, CATHETERIZED   Final    Special Requests NONE   Final    Culture  Setup Time 08/20/2011 16:55   Final    Colony Count NO GROWTH   Final    Culture NO GROWTH   Final    Report Status 08/21/2011 FINAL   Final     Anti-infectives     Start     Dose/Rate Route Frequency Ordered Stop   08/22/11 0000   vancomycin (VANCOCIN) IVPB 1000 mg/200 mL premix  Status:  Discontinued  1,000 mg 200 mL/hr over 60 Minutes Intravenous Every 12 hours 08/21/11 1103 08/24/11 0927   08/21/11 1400  piperacillin-tazobactam (ZOSYN) IVPB 3.375 g       3.375 g 12.5 mL/hr over 240 Minutes Intravenous 3 times per day 08/21/11 1103     08/21/11 1200   vancomycin (VANCOCIN) 2,000 mg in sodium chloride 0.9 % 500 mL IVPB        2,000 mg 250 mL/hr over 120 Minutes Intravenous  Once 08/21/11 1103 08/21/11 1342   08/18/11 2200   ceFAZolin (ANCEF) IVPB 1 g/50 mL premix  Status:  Discontinued        1 g 100 mL/hr over 30 Minutes Intravenous 3 times per day 08/18/11 2034 08/21/11 1028   08/18/11 0830   ampicillin-sulbactam (UNASYN) 1.5 g in sodium chloride 0.9 % 50 mL IVPB  Status:  Discontinued        1.5 g 100 mL/hr over 30 Minutes Intravenous Every 6 hours 08/18/11 0816 08/18/11 2216   08/16/11 1200   piperacillin-tazobactam (ZOSYN) IVPB 3.375 g  Status:  Discontinued         3.375 g 12.5 mL/hr over 240 Minutes Intravenous Every 8 hours 08/16/11 1108 08/16/11 1433   08/16/11 1200   vancomycin (VANCOCIN) 1,250 mg in sodium chloride 0.9 % 250 mL IVPB  Status:  Discontinued        1,250 mg 166.7 mL/hr over 90 Minutes Intravenous Every 12 hours 08/16/11 1108 08/16/11 1433          Assessment: Assessment: 61 yo F admitted 8/13 to [facility] with SAH and aspiration PNA. Pt was intubated and due to the extent of the hemorrhage, family chose to transition to comfort care. Upon extubation the patient began to show signs of neurologic improvement and the family decided to pursue further aggressive care resulting in transfer to [facility] for further neurologic intervention. s/p aneurism coiling in IR 8/16  Infectious Disease: Vancomycin/ Zosyn started for fever with aspiration PNA vs pneumonitis. Infection source unknown (CXR unrevealing). Tmax 100.5. WBC 11.3 down. Scr 0.77  Of note, pt received the following antibiotics at [facility]. Vanc 8/13, Zosyn 8/13, Unasyn 8/15, Ancef 8/15 >> 8/18  Current ABX: Vanc 1gm IV q12 8/18 >>8/21 Zosyn 3.375 IV q8 8/18>>  Cultures: Urine 8/17 >> No Growth Blood x 2 8/17 >>pending  Cards:HTN, BP 154/88,104 this am on nimodipine, labetolol  GI/Nutrition: Osmolite, Pro-stat, PPi changed to per tube.  L:ytes: K remains low at 2.8. KCl orally ordered today.  Endo: SSI/Lantus. CBGs 122-158  Neuro: SAH now s/p aneurism coiling in IR 8/16 and remains in VDRF post-op. Encephalopathy. Keppra, Nimodipine, Fent infusion  Pulm: Pt has failed SBT and suspect trach will be needed. VDRF  Renal: Scr 0.77 with UOP 1.4 ml/kg/hr yesterday.  Proph: SCDs, CHX, Mouth rinse, PPI   Plan:  MD d/c'd Vanco this am. Zosyn 3.375g IV q8h. Pharmacy will sign off. Zosyn does not required adjustment unless CrCl<30. Reconsult for further dosing assistance.  Merilynn Finland, [employer] 08/24/2011,10:29 AM

## 2011-08-24 NOTE — Progress Notes (Signed)
5 Days Post-Op  Subjective: Anterior communicating artery aneurysm coiling 8/16 Pt still on vent does not follow commands   Objective: Vital signs in last 24 hours: Temp:  [97.9 F (36.6 C)-100.5 F (38.1 C)] 100.1 F (37.8 C) (08/21 0800) Pulse Rate:  [64-104] 104  (08/21 0919) Resp:  [16-30] 25  (08/21 0919) BP: (131-196)/(44-143) 154/88 mmHg (08/21 0919) SpO2:  [97 %-100 %] 100 % (08/21 0919) FiO2 (%):  [29.7 %-30.6 %] 30.2 % (08/21 0919) Weight:  [238 lb 5.1 oz (108.1 kg)] 238 lb 5.1 oz (108.1 kg) (08/21 0500) Last BM Date: 08/23/11  Intake/Output from previous day: 08/20 0701 - 08/21 0700 In: 3421.3 [I.V.:2181.3; NG/GT:690; IV Piggyback:550] Out: 3726 [Urine:3425; Drains:301] Intake/Output this shift: Total I/O In: 7.5 [I.V.:7.5] Out: -   PE:  100.1; vss On vent Opens eyes to my voice Moving all 4s; not to command; agitated Groin nt; no bleeding   Lab Results:   Basename 08/24/11 0430 08/23/11 0510  WBC 11.3* 14.4*  HGB 10.1* 10.2*  HCT 31.6* 33.2*  PLT 229 245   BMET  Basename 08/24/11 0430 08/23/11 0510  NA 142 147*  K 2.8* 2.7*  CL 101 108  CO2 34* 32  GLUCOSE 149* 137*  BUN 14 15  CREATININE 0.77 0.78  CALCIUM 8.9 8.9   PT/INR No results found for this basename: LABPROT:2,INR:2 in the last 72 hours ABG  Basename 08/22/11 0520  PHART 7.403  HCO3 23.9    Studies/Results: Dg Chest Port 1 View  08/24/2011  *RADIOLOGY REPORT*  Clinical Data: Respiratory failure.  PORTABLE CHEST - 1 VIEW  Comparison: 08/23/2011  Findings: Endotracheal tube and NG tube as well as right PICC line remain in place, unchanged.  Low lung volumes with improving aeration and decreasing perihilar and lower lobe opacities, likely atelectasis.  Mild cardiomegaly and vascular congestion persist.  IMPRESSION: Continued low lung volumes, but decreasing perihilar and bibasilar atelectasis.   Original Report Authenticated By: Cyndie Chime, M.D.    Dg Chest Port 1  View  08/23/2011  *RADIOLOGY REPORT*  Clinical Data: Check endotracheal tube.  PORTABLE CHEST - 1 VIEW  Comparison: 08/22/2011  Findings: Endotracheal tube appears to be just above the carina but difficult to evaluate.  PICC line tip is probably near the SVC/right atrium junction.  Nasogastric tube extends into the abdomen.  There are low lung volumes and patchy densities in the left hilar region.  Heart size is at probably accentuated by the patient positioning and lung low lung volumes.  IMPRESSION: Low lung volumes with perihilar densities.  Perihilar densities could represent volume loss.  Support apparatuses as described.   Original Report Authenticated By: Richarda Overlie, M.D.     Anti-infectives:   Assessment/Plan: s/p Procedure(s) (LRB): RADIOLOGY WITH ANESTHESIA (N/A)  ACOM aneurysm coil 8/16 Pt does follow commands but has movement in all 4s On vent  Robertta Halfhill A 08/24/2011

## 2011-08-24 NOTE — Progress Notes (Addendum)
VASCULAR LAB PRELIMINARY  PRELIMINARY  PRELIMINARY  PRELIMINARY  Transcranial Doppler  Date POD PCO2 HCT BP  MCA ACA PCA OPHT SIPH VERT Basilar  8-16- 13 SB     Right  Left   54  64   --  -29   -22  33   13  13   37  26   *  -24     -37    8-19- 13 MS     Right  Left   *  96   *  -99   *  49   11  20   45  41   -19  -20   *      8-21- 13 MS     Right  Left   30  86   -45  -32   19  30   10  20    43  38   -33  -21   -36      08-26-11 fgn      Right  Left   *  67   *  34   *  42   *  *   *  *   *  *   *  *     08-29-11 VS     Right  Left   81  82   *  -19   *  39   15  22   47  38   *  -35     -36         Right  Left                                            Right  Left                                        MCA = Middle Cerebral Artery      OPHT = Opthalmic Artery     BASILAR = Basilar Artery   ACA = Anterior Cerebral Artery     SIPH = Carotid Siphon PCA = Posterior Cerebral Artery   VERT = Verterbral Artery                   Normal MCA = 62+\-12 ACA = 50+\-12 PCA = 42+\-23      *Unable to insonate.  Technically difficult due to the patient's head laying to the right side.   08/24/2011 4:36 PM Gertie Fey, RDMS, RDCS   * Technically limited due to constant movement of the head and eyes. Extremely difficult to exam the right side due to patient wanting the head to remain turned to the right 08/29/2011 2:42 PM [place], [place], RVS

## 2011-08-24 NOTE — Progress Notes (Signed)
Subjective: Patient reports more awake today  Objective: Vital signs in last 24 hours: Temp:  [97.9 F (36.6 C)-100.5 F (38.1 C)] 100.1 F (37.8 C) (08/21 0800) Pulse Rate:  [64-92] 72  (08/21 0800) Resp:  [16-30] 16  (08/21 0800) BP: (131-196)/(44-152) 174/78 mmHg (08/21 0800) SpO2:  [96 %-100 %] 100 % (08/21 0800) FiO2 (%):  [29.7 %-30.6 %] 30.2 % (08/21 0800) Weight:  [108.1 kg (238 lb 5.1 oz)] 108.1 kg (238 lb 5.1 oz) (08/21 0500)  Intake/Output from previous day: 08/20 0701 - 08/21 0700 In: 3421.3 [I.V.:2181.3; NG/GT:690; IV Piggyback:550] Out: 3726 [Urine:3425; Drains:301] Intake/Output this shift:    Physical Exam: Opening eyes spontaneously.  Spontaneous movement left side and right, still with hemiparesis.  IVC draining well at greater than 10cc/hour.  Lab Results:  Basename 08/24/11 0430 08/23/11 0510  WBC 11.3* 14.4*  HGB 10.1* 10.2*  HCT 31.6* 33.2*  PLT 229 245   BMET  Basename 08/24/11 0430 08/23/11 0510  NA 142 147*  K 2.8* 2.7*  CL 101 108  CO2 34* 32  GLUCOSE 149* 137*  BUN 14 15  CREATININE 0.77 0.78  CALCIUM 8.9 8.9    Studies/Results: Dg Chest Port 1 View  08/24/2011  *RADIOLOGY REPORT*  Clinical Data: Respiratory failure.  PORTABLE CHEST - 1 VIEW  Comparison: 08/23/2011  Findings: Endotracheal tube and NG tube as well as right PICC line remain in place, unchanged.  Low lung volumes with improving aeration and decreasing perihilar and lower lobe opacities, likely atelectasis.  Mild cardiomegaly and vascular congestion persist.  IMPRESSION: Continued low lung volumes, but decreasing perihilar and bibasilar atelectasis.   Original Report Authenticated By: Cyndie Chime, M.D.    Dg Chest Port 1 View  08/23/2011  *RADIOLOGY REPORT*  Clinical Data: Check endotracheal tube.  PORTABLE CHEST - 1 VIEW  Comparison: 08/22/2011  Findings: Endotracheal tube appears to be just above the carina but difficult to evaluate.  PICC line tip is probably near the  SVC/right atrium junction.  Nasogastric tube extends into the abdomen.  There are low lung volumes and patchy densities in the left hilar region.  Heart size is at probably accentuated by the patient positioning and lung low lung volumes.  IMPRESSION: Low lung volumes with perihilar densities.  Perihilar densities could represent volume loss.  Support apparatuses as described.   Original Report Authenticated By: Richarda Overlie, M.D.     Assessment/Plan: Continue IVC, HHH therapy, TCDs today.    LOS: 8 days    Dorian Heckle, MD 08/24/2011, 8:48 AM

## 2011-08-24 NOTE — Progress Notes (Signed)
Pt placed back on a rate for increased aggitation

## 2011-08-24 NOTE — Progress Notes (Addendum)
Name: [name] MRN: [id number] DOB: 16-Jan-1950    LOS: 8  Referring Provider:  Venetia Maxon (nsgy) Reason for Referral:  ICU management   PULMONARY / CRITICAL CARE MEDICINE   Brief Summary:   61 yo with HTN Admitted for diffuse subarachnoid hemorrhage and aspiration pneumonia with VDRF.  S/p coil  7x55mm Acom aneurysm  Interval history:  Remains on vent. BP is within goal. On [facility] therapy. Wont f/c.  Moderate secretions.  Weans well on PSV  Events Since Admit: 8/13  Presented to [facility] with SAH, intubated, extubated to comfort but improved 8/15  Transferred to [facility] 8/16  IR, postop VDRF, IVC 8/18  Increased spontaneous movements on L; increased secretions; failed SBT 8/18 AM due to apnea; ongoing fever  Vital Signs: Temp:  [97.9 F (36.6 C)-100.5 F (38.1 C)] 100.1 F (37.8 C) (08/21 0800) Pulse Rate:  [64-92] 72  (08/21 0800) Resp:  [16-30] 16  (08/21 0800) BP: (131-196)/(44-152) 174/78 mmHg (08/21 0800) SpO2:  [96 %-100 %] 100 % (08/21 0800) FiO2 (%):  [29.7 %-30.6 %] 30.2 % (08/21 0900) Weight:  [108.1 kg (238 lb 5.1 oz)] 108.1 kg (238 lb 5.1 oz) (08/21 0500)  Physical Examination: Gen: is more alert but wont f/c, ETT in place, but is not a subglottic tube HEENT: drain in place, c/d/i, no erythema PULM: clearer , moderate secretions  CV: RRR, no mgr AB: bS+, soft, nontender Ext: warm, 2+edema Neuro: agitated, moves L arm spontaneously but not purposefully; does not open eyes to pain; no movement on R side   Principal Problem:  *Subarachnoid hemorrhage Active Problems:  Encephalopathy  Aspiration pneumonia  Acute respiratory failure  HTN (hypertension)  ASSESSMENT AND PLAN  PULMONARY  Lab 08/22/11 0520 08/21/11 0440 08/19/11 1448  PHART 7.403 7.314* 7.456*  PCO2ART 39.6 48.6* 36.2  PO2ART 89.3 78.8* 70.3*  HCO3 23.9 23.5 25.2*  O2SAT 96.1 91.7 93.8   Ventilator Settings: Vent Mode:  [-] PRVC FiO2 (%):  [29.7 %-30.6 %] 30.2 % Set Rate:  [16 bmp] 16 bmp Vt  Set:  [530 mL] 530 mL PEEP:  [5 cmH20] 5 cmH20 Pressure Support:  [10 cmH20] 10 cmH20 Plateau Pressure:  [21 cmH20-22 cmH20] 21 cmH20 CXR:  8/21 >>>perihilar ATX, better ETT:   8/13>>8/14, 8/16 >>>  A:  Postop VDRF.  Aspiration pneumonia vs pneumonitis? No evidence by CXR.   P:   Cycle psv today No plans to extubate for now SBT WUA daily  CARDIOVASCULAR No results found for this basename: TROPONINI:5,LATICACIDVEN:5, O2SATVEN:5,PROBNP:5 in the last 168 hours Lines:   8/16  L Fem Sheath>>>8/19 8/16  R PICC >>>  A:  HTN, still on cardene gtt P:  Per Neurosurgery Goal is SBP 200-140  Cont labetalol for now   RENAL  Lab 08/24/11 0430 08/23/11 0510 08/22/11 0411 08/21/11 0300 08/20/11 0600  NA 142 147* 148* 147* 144  K 2.8* 2.7* -- -- --  CL 101 108 114* 114* 108  CO2 34* 32 26 23 26   BUN 14 15 25* 23 22  CREATININE 0.77 0.78 0.96 0.84 1.05  CALCIUM 8.9 8.9 9.0 8.3* 8.7  MG -- -- -- -- --  PHOS -- -- -- -- --   Foley:  8/13>>>  A: Hypokalemia.  Normal renal function. P:   K repleted this AM by ELINK Trend BMP  GASTROINTESTINAL  Lab 08/18/11 1336  AST 28  ALT 16  ALKPHOS 75  BILITOT 0.6  PROT 7.9  ALBUMIN 3.0*   A:  No acute issues. Mild Gastric residual P:   Enteral tube feedings  HEMATOLOGIC  Lab 08/24/11 0430 08/23/11 0510 08/22/11 0411 08/21/11 0300 08/20/11 0600 08/18/11 1336  HGB 10.1* 10.2* 10.5* 10.5* 10.5* --  HCT 31.6* 33.2* 33.5* 32.7* 32.9* --  PLT 229 245 232 231 230 --  INR -- -- -- -- -- 1.13  APTT -- -- -- -- -- 32   A:  No acute issue. P:  Trend CBC SCD  INFECTIOUS  Lab 08/24/11 0430 08/23/11 0510 08/22/11 0411 08/21/11 0300 08/20/11 0600 08/18/11 1336  WBC 11.3* 14.4* 17.2* 12.9* 10.7* --  PROCALCITON -- -- -- -- -- 0.35   Cultures: 8/17 blood >>neg 8/17 urine >>neg 8/20 resp c/s>>  Antibiotics: Unasyn 8/13 >>>8/15 Cefazolin 8/15 >> 8/18 Vanc 8/18(asp pna) >>8/21 Zosyn 8/18 (asp pna)>>   A: fever better as is WBC    ?d/t to CNS blood,  P:    Narrow to zosyn alone. D/c vanco  ENDOCRINE  Lab 08/24/11 0337 08/23/11 2340 08/23/11 1951 08/23/11 1552 08/23/11 1218  GLUCAP 124* 144* 122* 147* 128*    A:  Hyperglycemia. Better with Lantus and SSI  P:   SSI/CBG Cont  lantus  NEUROLOGIC  8/16 IVC Drain>>>  A:   Subarachnoid hemorrhage s/p aneurism coiling Acom aneurysm Acute encephalopathy, improved 8/18 but still not following commands, protecting airway Prognosis?   Has ischemic areas on R  internal capsule and peripherally R anterior cortex,    Has mild vasospasm P:   Per Neurosurgery Fentanyl / Versed TCDs today     BEST PRACTICE / DISPOSITION Level of Care:  ICU Primary Service:  Neurosurgery Consultants:  Neurology, PCCM Code Status:  Full Diet:  TF DVT Px: SCD's GI Px:  Protonix Skin Integrity:  Intact Social / Family:  Updated at bedside  CC time 35 minutes   [name]  [phone]  Cell  [phone]  If no response or cell goes to voicemail, call beeper [phone]

## 2011-08-25 ENCOUNTER — Encounter (HOSPITAL_COMMUNITY): Payer: Self-pay

## 2011-08-25 ENCOUNTER — Inpatient Hospital Stay (HOSPITAL_COMMUNITY): Payer: Medicaid Other

## 2011-08-25 LAB — BASIC METABOLIC PANEL
BUN: 15 mg/dL (ref 6–23)
Chloride: 100 mEq/L (ref 96–112)
GFR calc Af Amer: >90 mL/min (ref >90)
Glucose, Bld: 169 mg/dL — ABNORMAL HIGH (ref 70–99)
Potassium: 3 mEq/L — ABNORMAL LOW (ref 3.5–5.1)
Sodium: 141 mEq/L (ref 135–145)

## 2011-08-25 LAB — GLUCOSE, CAPILLARY
Glucose-Capillary: 141 mg/dL — ABNORMAL HIGH (ref 70–99)
Glucose-Capillary: 151 mg/dL — ABNORMAL HIGH (ref 70–99)
Glucose-Capillary: 148 mg/dL — ABNORMAL HIGH (ref 70–99)

## 2011-08-25 LAB — CBC
HCT: 31.4 % — ABNORMAL LOW (ref 36.0–46.0)
Hemoglobin: 10 g/dL — ABNORMAL LOW (ref 12.0–15.0)
MCHC: 31.8 g/dL (ref 30.0–36.0)
RBC: 3.85 MIL/uL — ABNORMAL LOW (ref 3.87–5.11)

## 2011-08-25 LAB — CLOSTRIDIUM DIFFICILE BY PCR: Toxigenic C. Difficile by PCR: NEGATIVE

## 2011-08-25 MED ORDER — LABETALOL HCL 200 MG PO TABS
200.0000 mg | ORAL_TABLET | Freq: Three times a day (TID) | ORAL | Status: DC
  Administered 2011-08-25 – 2011-08-26 (×6): 200 mg via ORAL
  Filled 2011-08-25 (×9): qty 1

## 2011-08-25 MED ORDER — POTASSIUM CHLORIDE 20 MEQ/15ML (10%) PO LIQD
40.0000 meq | ORAL | Status: AC
  Administered 2011-08-25 (×2): 40 meq
  Filled 2011-08-25 (×2): qty 30

## 2011-08-25 MED ORDER — POTASSIUM CHLORIDE 20 MEQ/15ML (10%) PO LIQD
40.0000 meq | Freq: Two times a day (BID) | ORAL | Status: DC
  Administered 2011-08-25 – 2011-09-02 (×17): 40 meq
  Filled 2011-08-25 (×20): qty 30

## 2011-08-25 NOTE — Progress Notes (Signed)
Patient ID: [name], female   DOB: 12-04-1950, 61 y.o.   MRN: [id number] BP 177/80  Pulse 74  Temp 99.1 F (37.3 C) (Axillary)  Resp 16  Ht 5\' 9"  (1.753 m)  Wt 104.7 kg (230 lb 13.2 oz)  BMI 34.09 kg/m2  SpO2 100% Alert, following some commands. Moved left lower extremity. Pupils equal round and reactive. Opens eyes to voice. Symmetric facial movements.  Conjugate eye movements Remains intubated.  Ventric draining well.  Probable trach in near term.

## 2011-08-25 NOTE — Progress Notes (Signed)
Name: [name] MRN: [id number] DOB: 05/02/1950    LOS: 9  Referring Provider:  Venetia Maxon (nsgy) Reason for Referral:  ICU management   PULMONARY / CRITICAL CARE MEDICINE   Brief Summary:   61 yo with HTN Admitted for diffuse subarachnoid hemorrhage and aspiration pneumonia with VDRF.  S/p coil  7x29mm Acom aneurysm  Interval history:  Pt tol PSV well.  Not able to protect airway so still needs ETT. May need trach.  TCDs better.   Events Since Admit: 8/13  Presented to [facility] with SAH, intubated, extubated to comfort but improved 8/15  Transferred to [facility] 8/16  IR, postop VDRF, IVC 8/18  Increased spontaneous movements on L; increased secretions; failed SBT 8/18 AM due to apnea; ongoing fever 8/22 TCDs better.  PSV cycle.  Still needs ETT  Vital Signs: Temp:  [99.6 F (37.6 C)-100.5 F (38.1 C)] 99.8 F (37.7 C) (08/22 0334) Pulse Rate:  [60-104] 79  (08/22 0700) Resp:  [16-28] 16  (08/22 0700) BP: (110-193)/(69-125) 163/80 mmHg (08/22 0700) SpO2:  [96 %-100 %] 100 % (08/22 0700) FiO2 (%):  [29.9 %-30.6 %] 30.2 % (08/22 0700) Weight:  [104.7 kg (230 lb 13.2 oz)] 104.7 kg (230 lb 13.2 oz) (08/22 0500)  Physical Examination: Gen: is more alert but wont f/c, ETT in place, but is not a subglottic tube HEENT: IVC drain in place, c/d/i, no erythema PULM: clearer , moderate secretions  CV: RRR, no mgr AB: bS+, soft, nontender Ext: warm, 2+edema Neuro: agitated, moves L arm spontaneously but not purposefully; does not open eyes to pain; no movement on R side , will not f/c  Principal Problem:  *Subarachnoid hemorrhage Active Problems:  Encephalopathy  Aspiration pneumonia  Acute respiratory failure  HTN (hypertension)  Vasospasm  ASSESSMENT AND PLAN  PULMONARY  Lab 08/22/11 0520 08/21/11 0440 08/19/11 1448  PHART 7.403 7.314* 7.456*  PCO2ART 39.6 48.6* 36.2  PO2ART 89.3 78.8* 70.3*  HCO3 23.9 23.5 25.2*  O2SAT 96.1 91.7 93.8   Ventilator Settings: Vent Mode:  [-]  PRVC FiO2 (%):  [29.9 %-30.6 %] 30.2 % Set Rate:  [16 bmp] 16 bmp Vt Set:  [530 mL] 530 mL PEEP:  [5 cmH20-5.1 cmH20] 5.1 cmH20 Pressure Support:  [10 cmH20] 10 cmH20 Plateau Pressure:  [21 cmH20-24 cmH20] 24 cmH20 CXR:  8/22 >>>perihilar ATX, better ETT:   8/13>>8/14, 8/16 >>>  A:  Postop VDRF.  Aspiration pneumonia vs pneumonitis? No evidence by CXR.   P:   Cycle psv today No plans to extubate for now, will likely need trach ?fri or Monday SBT WUA daily  CARDIOVASCULAR No results found for this basename: TROPONINI:5,LATICACIDVEN:5, O2SATVEN:5,PROBNP:5 in the last 168 hours Lines:   8/16  L Fem Sheath>>>8/19 8/16  R PICC >>>  A:  HTN, off cardene drip.  Allowing higher range SBP.  HHH in place.  CVP adequate at  7 P:  Per Neurosurgery Goal is SBP 200-140  Cont labetalol for now   RENAL  Lab 08/25/11 0520 08/24/11 1200 08/24/11 0430 08/23/11 0510 08/22/11 0411  NA 141 142 142 147* 148*  K 3.0* 3.3* -- -- --  CL 100 101 101 108 114*  CO2 32 34* 34* 32 26  BUN 15 15 14 15  25*  CREATININE 0.75 0.81 0.77 0.78 0.96  CALCIUM 8.6 8.7 8.9 8.9 9.0  MG -- -- -- -- --  PHOS -- -- -- -- --   Foley:  8/13>>>  A: Hypokalemia.  Normal renal function.  P:   K repleted this AM by Pola Corn Trend BMP Supp KCL daily on schedule  GASTROINTESTINAL  Lab 08/18/11 1336  AST 28  ALT 16  ALKPHOS 75  BILITOT 0.6  PROT 7.9  ALBUMIN 3.0*   A:  No acute issues. Loose stools , CDiff neg P:   Enteral tube feedings  HEMATOLOGIC  Lab 08/25/11 0520 08/24/11 0430 08/23/11 0510 08/22/11 0411 08/21/11 0300 08/18/11 1336  HGB 10.0* 10.1* 10.2* 10.5* 10.5* --  HCT 31.4* 31.6* 33.2* 33.5* 32.7* --  PLT 233 229 245 232 231 --  INR -- -- -- -- -- 1.13  APTT -- -- -- -- -- 32   A:  No acute issue. P:  Trend CBC SCD  INFECTIOUS  Lab 08/25/11 0520 08/24/11 0430 08/23/11 0510 08/22/11 0411 08/21/11 0300 08/18/11 1336  WBC 11.7* 11.3* 14.4* 17.2* 12.9* --  PROCALCITON -- -- -- -- --  0.35   Cultures: 8/17 blood >>neg 8/17 urine >>neg 8/20 resp c/s>> 8/21 CDiff>>>neg  Antibiotics: Unasyn 8/13 >>>8/15 Cefazolin 8/15 >> 8/18 Vanc 8/18(asp pna) >>8/21 Zosyn 8/18 (asp pna)>>8/22   A: fever better but still low grade,   WBC lower    ?d/t to CNS blood,  Diarrhea ABX and TF induced but no Cdiff P:    D/c ABX, give holiday . All c/s NEG   ENDOCRINE  Lab 08/25/11 0734 08/25/11 0331 08/24/11 2350 08/24/11 2015 08/24/11 1557  GLUCAP 151* 141* 142* 176* 161*    A:  Hyperglycemia. Better with Lantus and SSI  P:   SSI/CBG Cont  lantus  NEUROLOGIC  8/16 IVC Drain>>>  A:   Subarachnoid hemorrhage s/p aneurism coiling Acom aneurysm Acute encephalopathy, improved 8/18 but still not following commands, protecting airway Prognosis?   Has ischemic areas on R  internal capsule and peripherally R anterior cortex,    Has mild vasospasm but TCDs better  P:   Per Neurosurgery Fentanyl / Versed    BEST PRACTICE / DISPOSITION Level of Care:  ICU Primary Service:  Neurosurgery Consultants:  Neurology, PCCM Code Status:  Full Diet:  TF DVT Px: SCD's GI Px:  Protonix Skin Integrity:  Intact Social / Family:  Updated at bedside  CC time 35 minutes   [name]  [phone]  Cell  [phone]  If no response or cell goes to voicemail, call beeper [phone]

## 2011-08-25 NOTE — Progress Notes (Signed)
Subjective: Pt awake and and can follow some commands. Denies specific c/o.  Objective: Physical Exam: BP 166/74  Pulse 75  Temp 99.8 F (37.7 C) (Oral)  Resp 27  Ht 5\' 9"  (1.753 m)  Wt 230 lb 13.2 oz (104.7 kg)  BMI 34.09 kg/m2  SpO2 100% Does follow commands to move both UE. Can squeeze fingers, L>R Moves both feet also but (R) weaker Groin sites clean, no hematomas.  Labs: CBC  Basename 08/25/11 0520 08/24/11 0430  WBC 11.7* 11.3*  HGB 10.0* 10.1*  HCT 31.4* 31.6*  PLT 233 229   BMET  Basename 08/25/11 0520 08/24/11 1200  NA 141 142  K 3.0* 3.3*  CL 100 101  CO2 32 34*  GLUCOSE 169* 193*  BUN 15 15  CREATININE 0.75 0.81  CALCIUM 8.6 8.7   LFT No results found for this basename: PROT,ALBUMIN,AST,ALT,ALKPHOS,BILITOT,BILIDIR,IBILI,LIPASE in the last 72 hours PT/INR No results found for this basename: LABPROT:2,INR:2 in the last 72 hours   Studies/Results: Dg Chest Port 1 View  08/25/2011  *RADIOLOGY REPORT*  Clinical Data: Respiratory failure with subarachnoid hemorrhage.  PORTABLE CHEST - 1 VIEW  Comparison: 08/24/2011  Findings: Low lung volumes persist.  Cardiomegaly.  Mild vascular congestion without overt failure.  Bibasilar atelectasis.  Tubes and lines unchanged.  IMPRESSION: Stable exam.   Original Report Authenticated By: Elsie Stain, M.D.    Dg Chest Port 1 View  08/24/2011  *RADIOLOGY REPORT*  Clinical Data: Respiratory failure.  PORTABLE CHEST - 1 VIEW  Comparison: 08/23/2011  Findings: Endotracheal tube and NG tube as well as right PICC line remain in place, unchanged.  Low lung volumes with improving aeration and decreasing perihilar and lower lobe opacities, likely atelectasis.  Mild cardiomegaly and vascular congestion persist.  IMPRESSION: Continued low lung volumes, but decreasing perihilar and bibasilar atelectasis.   Original Report Authenticated By: Cyndie Chime, M.D.     Assessment/Plan: SAH---->ACOM aneurysm coil 8/16  Awake and  following some commands this am, moving both UE and LE, though significant weakness on (R) Still needing ETT and looking like Trach plans for next few days.    LOS: 9 days    Brayton El PA-C 08/25/2011 10:12 AM

## 2011-08-25 NOTE — Clinical Social Work Note (Signed)
CSW received consult for SNF from [facility]. CSW reviewed chart and barriers for SNF placement. Assessment to follow for possible SNF placement. Please call with any urgent needs or questions.   Dede Query, MSW, Theresia Majors [phone]

## 2011-08-25 NOTE — Progress Notes (Signed)
eLink Physician-Brief Progress Note Patient Name: [name] DOB: [birth year] MRN: [id number]  Date of Service  08/25/2011   HPI/Events of Note  hypokalemia   eICU Interventions  Potassium replaced   Intervention Category Intermediate Interventions: Electrolyte abnormality - evaluation and management  Kirtis Challis 08/25/2011, 6:51 AM

## 2011-08-26 ENCOUNTER — Inpatient Hospital Stay (HOSPITAL_COMMUNITY): Payer: Medicaid Other

## 2011-08-26 LAB — GLUCOSE, CAPILLARY
Glucose-Capillary: 126 mg/dL — ABNORMAL HIGH (ref 70–99)
Glucose-Capillary: 146 mg/dL — ABNORMAL HIGH (ref 70–99)
Glucose-Capillary: 152 mg/dL — ABNORMAL HIGH (ref 70–99)
Glucose-Capillary: 154 mg/dL — ABNORMAL HIGH (ref 70–99)
Glucose-Capillary: 176 mg/dL — ABNORMAL HIGH (ref 70–99)

## 2011-08-26 LAB — CULTURE, BLOOD (ROUTINE X 2): Culture: NO GROWTH

## 2011-08-26 LAB — CBC
HCT: 30 % — ABNORMAL LOW (ref 36.0–46.0)
MCH: 26.2 pg (ref 26.0–34.0)
MCV: 81.1 fL (ref 78.0–100.0)
RDW: 16 % — ABNORMAL HIGH (ref 11.5–15.5)
WBC: 13.3 10*3/uL — ABNORMAL HIGH (ref 4.0–10.5)

## 2011-08-26 LAB — BASIC METABOLIC PANEL
CO2: 30 mEq/L (ref 19–32)
Chloride: 101 mEq/L (ref 96–112)
Creatinine, Ser: 0.73 mg/dL (ref 0.50–1.10)
Glucose, Bld: 141 mg/dL — ABNORMAL HIGH (ref 70–99)

## 2011-08-26 NOTE — Progress Notes (Signed)
Name: ARLYNE BRANDES MRN: [id number] DOB: December 26, 1950    LOS: 10  Referring Provider:  Venetia Maxon (nsgy) Reason for Referral:  ICU management   PULMONARY / CRITICAL CARE MEDICINE   Brief Summary:   61 yo with HTN Admitted for diffuse subarachnoid hemorrhage and aspiration pneumonia with VDRF.  S/p coil  7x71mm Acom aneurysm  Interval history:  More alert,  tol PSV well. May be extubatable.     Events Since Admit: 8/13  Presented to [facility] with SAH, intubated, extubated to comfort but improved 8/15  Transferred to [facility] 8/16  IR, postop VDRF, IVC 8/18  Increased spontaneous movements on L; increased secretions; failed SBT 8/18 AM due to apnea; ongoing fever 8/22 TCDs better.  PSV cycle.  Still needs ETT  Vital Signs: Temp:  [98 F (36.7 C)-99.8 F (37.7 C)] 98.8 F (37.1 C) (08/23 0800) Pulse Rate:  [65-104] 80  (08/23 0900) Resp:  [16-30] 20  (08/23 0900) BP: (133-193)/(67-105) 170/77 mmHg (08/23 0900) SpO2:  [96 %-100 %] 99 % (08/23 0900) FiO2 (%):  [29.7 %-30.5 %] 29.9 % (08/23 0900) Weight:  [108 kg (238 lb 1.6 oz)] 108 kg (238 lb 1.6 oz) (08/23 0431)  Physical Examination: Gen: is more alert but wont f/c, ETT in place, but is not a subglottic tube HEENT: IVC drain in place, c/d/i, no erythema PULM: clearer , moderate secretions  CV: RRR, no mgr AB: bS+, soft, nontender Ext: warm, 2+edema Neuro: much more alert.   Will not follow commands  Principal Problem:  *Subarachnoid hemorrhage Active Problems:  Encephalopathy  Aspiration pneumonia  Acute respiratory failure  HTN (hypertension)  Vasospasm  ASSESSMENT AND PLAN  PULMONARY  Lab 08/22/11 0520 08/21/11 0440 08/19/11 1448  PHART 7.403 7.314* 7.456*  PCO2ART 39.6 48.6* 36.2  PO2ART 89.3 78.8* 70.3*  HCO3 23.9 23.5 25.2*  O2SAT 96.1 91.7 93.8   Ventilator Settings: Vent Mode:  [-] CPAP FiO2 (%):  [29.7 %-30.5 %] 29.9 % Set Rate:  [16 bmp] 16 bmp Vt Set:  [530 mL] 530 mL PEEP:  [4.2 cmH20-5.1 cmH20] 4.2  cmH20 Pressure Support:  [10 cmH20] 10 cmH20 Plateau Pressure:  [19 cmH20-21 cmH20] 19 cmH20 CXR:  8/22 >>>perihilar ATX, better ETT:   8/13>>8/14, 8/16 >>>  A:  Postop VDRF.  Aspiration pneumonia vs pneumonitis? No evidence by CXR.   P:   Poss extubation today  CARDIOVASCULAR No results found for this basename: TROPONINI:5,LATICACIDVEN:5, O2SATVEN:5,PROBNP:5 in the last 168 hours Lines:   8/16  L Fem Sheath>>>8/19 8/16  R PICC >>>  A:  HTN, off cardene drip.  Allowing higher range SBP.  HHH in place.  CVP adequate Bp at goal   P:  Per Neurosurgery Goal is SBP 200-140  Cont labetalol for now   RENAL  Lab 08/26/11 0405 08/25/11 0520 08/24/11 1200 08/24/11 0430 08/23/11 0510  NA 139 141 142 142 147*  K 3.4* 3.0* -- -- --  CL 101 100 101 101 108  CO2 30 32 34* 34* 32  BUN 16 15 15 14 15   CREATININE 0.73 0.75 0.81 0.77 0.78  CALCIUM 8.8 8.6 8.7 8.9 8.9  MG -- -- -- -- --  PHOS -- -- -- -- --   Foley:  8/13>>>  A: Hypokalemia.  Normal renal function.  P:   K repleted this AM by Pola Corn Trend BMP Supp KCL daily on schedule  GASTROINTESTINAL No results found for this basename: AST:5,ALT:5,ALKPHOS:5,BILITOT:5,PROT:5,ALBUMIN:5 in the last 168 hours A:  No acute issues. Loose  stools , CDiff neg P:   Enteral tube feedings  HEMATOLOGIC  Lab 08/26/11 0405 08/25/11 0520 08/24/11 0430 08/23/11 0510 08/22/11 0411  HGB 9.7* 10.0* 10.1* 10.2* 10.5*  HCT 30.0* 31.4* 31.6* 33.2* 33.5*  PLT 217 233 229 245 232  INR -- -- -- -- --  APTT -- -- -- -- --   A:  No acute issue. P:  Trend CBC SCD  INFECTIOUS  Lab 08/26/11 0405 08/25/11 0520 08/24/11 0430 08/23/11 0510 08/22/11 0411  WBC 13.3* 11.7* 11.3* 14.4* 17.2*  PROCALCITON -- -- -- -- --   Cultures: 8/17 blood >>neg 8/17 urine >>neg 8/20 resp c/s>> 8/21 CDiff>>>neg  Antibiotics: Unasyn 8/13 >>>8/15 Cefazolin 8/15 >> 8/18 Vanc 8/18(asp pna) >>8/21 Zosyn 8/18 (asp pna)>>8/22   A: Fever/WBC better  P:     Hold ABX for now   ENDOCRINE  Lab 08/26/11 0822 08/26/11 0338 08/26/11 0008 08/25/11 2207 08/25/11 2007  GLUCAP 168* 146* 126* 149* 162*    A:  Hyperglycemia. Better with Lantus and SSI  P:   SSI/CBG Cont  lantus  NEUROLOGIC  8/16 IVC Drain>>>  A:   Subarachnoid hemorrhage s/p aneurism coiling Acom aneurysm Acute encephalopathy, improved 8/18 but still not following commands, protecting airway Prognosis?   Has ischemic areas on R  internal capsule and peripherally R anterior cortex,    Has mild vasospasm but TCDs better  P:   Per Neurosurgery Prn sedation F/u TCDs    BEST PRACTICE / DISPOSITION Level of Care:  ICU Primary Service:  Neurosurgery Consultants:  Neurology, PCCM Code Status:  Full Diet:  TF DVT Px: SCD's GI Px:  Protonix Skin Integrity:  Intact Social / Family:  Updated at bedside  CC time 35 minutes   [name]  [phone]  Cell  [phone]  If no response or cell goes to voicemail, call beeper [phone]

## 2011-08-26 NOTE — Progress Notes (Signed)
Patient ID: [name], female   DOB: [birth year], 61 y.o.   MRN: [id number] BP 163/84  Pulse 81  Temp 100 F (37.8 C) (Oral)  Resp 18  Ht 5\' 9"  (1.753 m)  Wt 108 kg (238 lb 1.6 oz)  BMI 35.16 kg/m2  SpO2 100% Alert, intubated, following commands. Perrl, symmetric facies, symmetric facial movements Moving both upper extremities, left lower extremity.  Improving neurologically Continue to wean on the vent Ventric is draining well

## 2011-08-27 ENCOUNTER — Inpatient Hospital Stay (HOSPITAL_COMMUNITY): Payer: Medicaid Other

## 2011-08-27 LAB — CBC
MCH: 26 pg (ref 26.0–34.0)
MCV: 80.4 fL (ref 78.0–100.0)
Platelets: 250 10*3/uL (ref 150–400)
RDW: 16.1 % — ABNORMAL HIGH (ref 11.5–15.5)
WBC: 14.8 10*3/uL — ABNORMAL HIGH (ref 4.0–10.5)

## 2011-08-27 LAB — GLUCOSE, CAPILLARY
Glucose-Capillary: 115 mg/dL — ABNORMAL HIGH (ref 70–99)
Glucose-Capillary: 173 mg/dL — ABNORMAL HIGH (ref 70–99)
Glucose-Capillary: 146 mg/dL — ABNORMAL HIGH (ref 70–99)

## 2011-08-27 LAB — BASIC METABOLIC PANEL
Calcium: 9.2 mg/dL (ref 8.4–10.5)
Creatinine, Ser: 0.6 mg/dL (ref 0.50–1.10)
GFR calc Af Amer: >90 mL/min (ref >90)

## 2011-08-27 MED ORDER — NIMODIPINE 60 MG/20ML PO SOLN
60.0000 mg | ORAL | Status: DC
  Administered 2011-08-27 – 2011-09-04 (×41): 60 mg
  Filled 2011-08-27 (×50): qty 20

## 2011-08-27 MED ORDER — LABETALOL HCL 200 MG PO TABS
200.0000 mg | ORAL_TABLET | Freq: Three times a day (TID) | ORAL | Status: DC
  Administered 2011-08-27 – 2011-09-05 (×27): 200 mg
  Filled 2011-08-27 (×32): qty 1

## 2011-08-27 MED ORDER — HYDRALAZINE HCL 20 MG/ML IJ SOLN
20.0000 mg | Freq: Three times a day (TID) | INTRAMUSCULAR | Status: DC
  Administered 2011-08-27 – 2011-09-05 (×24): 20 mg via INTRAVENOUS
  Filled 2011-08-27 (×30): qty 1

## 2011-08-27 MED ORDER — OXYMETAZOLINE HCL 0.05 % NA SOLN
1.0000 | Freq: Two times a day (BID) | NASAL | Status: DC | PRN
Start: 2011-08-27 — End: 2011-09-03
  Administered 2011-08-29 – 2011-08-30 (×2): 1 via NASAL
  Filled 2011-08-27: qty 15

## 2011-08-27 MED ORDER — LABETALOL HCL 5 MG/ML IV SOLN
10.0000 mg | INTRAVENOUS | Status: DC | PRN
  Administered 2011-08-27 – 2011-08-28 (×4): 10 mg via INTRAVENOUS
  Filled 2011-08-27 (×4): qty 4

## 2011-08-27 MED ORDER — HALOPERIDOL LACTATE 5 MG/ML IJ SOLN
5.0000 mg | Freq: Once | INTRAMUSCULAR | Status: AC
  Administered 2011-08-28: 5 mg via INTRAVENOUS
  Filled 2011-08-27: qty 1

## 2011-08-27 NOTE — Progress Notes (Deleted)
CCM called about pt's aerobic blood culture being positive for gram negative rods and got confirmation that pt is covered on current antibiotics - zosyn and vancomycin. 

## 2011-08-27 NOTE — Evaluation (Signed)
Clinical/Bedside Swallow Evaluation Patient Details  Name: [name] MRN: [id number] Date of Birth: 02-26-1950  Today's Date: 08/27/2011 Time: 1200-1245 SLP Time Calculation (min): 45 min  Past Medical History:  Past Medical History  Diagnosis Date  . Hypertension    Past Surgical History:  Past Surgical History  Procedure Date  . Abdominal hysterectomy    HPI:  61 y/o female with admitted to [facility] on 8/13 with headache and acute encephalopathy.  Patient intubated and admitted for subarachnoid hemorrhage and aspiration pneumonia.  Patient extubated for transition to comfort care due to the extent of the hemorrhage.  Patient with neurological improvement  and transferred to [facility] for  Neurological intervention.   Postop VDRFon 8/16.  Intubated 8/16 to 8/24.  Patient referred for BSE to assess risk for aspiration s/p extubation.    Assessment / Plan / Recommendation Clinical Impression  Oropharyngeal dysphagia indicated due to combination of decreased cognition with inability to follow directions to clear throat/cough to protect airway, weakness , decreased oral sensation with oral holding of PO trials, decreased sensory with delay in initiation of the swallow. Patient unable to phonate on command.    Throat clears noted moderately after swallow of thin liquids by cup sips with dramatic increase in RR .  Minimal trials administered with puree consistency as patient would turn head.    No attempt by patient for self feeding  even with hand over hand assist.  Recommend continued NPO secondary to  s/s present during evaluation with patient's decreased ability to protect airway with PO's.    ST to reassess swallow bedside for PO readiness next am on 08/27/11 .  Patient unable to complete objective assessment at this time due to decreased ability to follow directions.  Completion of objective assessment to be determined.    Aspiration Risk  Severe    Diet Recommendation NPO;Alternative means -  temporary   Medication Administration: Via alternative means    Other  Recommendations     Follow Up Recommendations  Inpatient Rehab    Frequency and Duration min 2x/week  2 weeks       SLP Swallow Goals Goal #3: Patient will consume PO trials of various consistencies administered by SLP only with no observed s/s of aspiration.     Swallow Study Prior Functional Status   Patient lived at home and consumed regular consistency and thin liquids with no prior reports of aspiration.     General Date of Onset: 08/16/11 HPI: 61 y/o female with admitted to [facility] on 8/13 with headache and acute encephalopathy.  Patient intubated and admitted for subarachnoidhemorrhage and aspiration pneumonia.  Patient extubated for transition to comfort care due to the extent of the hemorrhage.  Patient with neurological improvement  and transferred to [facility] for Neurological intervention.   Type of Study: Bedside swallow evaluation Previous Swallow Assessment: no prior reports of BSE Diet Prior to this Study: NPO Temperature Spikes Noted: No Respiratory Status: Supplemental O2 delivered via (comment) History of Recent Intubation: Yes Length of Intubations (days): 9 days Date extubated: 08/27/11 Behavior/Cognition: Alert;Confused;Agitated;Impulsive;Distractible;Requires cueing;Doesn't follow directions;Decreased sustained attention Oral Cavity - Dentition: Edentulous (endentulous top row) Self-Feeding Abilities: Total assist Patient Positioning: Upright in bed Baseline Vocal Quality: Aphonic Volitional Cough: Cognitively unable to elicit Volitional Swallow: Unable to elicit    Oral/Motor/Sensory Function Overall Oral Motor/Sensory Function: Impaired Labial Sensation: Reduced Lingual Strength: Reduced Lingual Sensation: Reduced Facial Strength: Reduced Facial Sensation: Reduced   Ice Chips Ice chips: Impaired Oral Phase Impairments: Reduced  labial seal;Reduced lingual movement/coordination;Impaired  anterior to posterior transit;Poor awareness of bolus Oral Phase Functional Implications: Right anterior spillage Pharyngeal Phase Impairments: Suspected delayed Swallow;Decreased hyoid-laryngeal movement;Throat Clearing - Delayed   Thin Liquid Thin Liquid: Impaired Presentation: Cup;Spoon Oral Phase Impairments: Reduced labial seal;Poor awareness of bolus Oral Phase Functional Implications: Right anterior spillage Pharyngeal  Phase Impairments: Suspected delayed Swallow;Throat Clearing - Delayed;Decreased hyoid-laryngeal movement    Nectar Thick Nectar Thick Liquid: Not tested   Honey Thick Honey Thick Liquid: Not tested   Puree Puree: Impaired Presentation: Spoon Oral Phase Impairments: Poor awareness of bolus Oral Phase Functional Implications: Oral holding;Prolonged oral transit Pharyngeal Phase Impairments: Suspected delayed Swallow;Decreased hyoid-laryngeal movement;Change in Vital Signs   Solid   Moreen Fowler, MS, CCC-SLP [phone]    Solid: Not tested       [facility] 08/27/2011,1:44 PM

## 2011-08-27 NOTE — Progress Notes (Signed)
Patient ID: [name], female   DOB: 1950/12/05, 61 y.o.   MRN: [id number] BP 174/85  Pulse 75  Temp 98.8 F (37.1 C) (Oral)  Resp 30  Ht 5\' 9"  (1.753 m)  Wt 104.6 kg (230 lb 9.6 oz)  BMI 34.05 kg/m2  SpO2 100% Alert, following commands Non fluent, voice hoarse and weak Symmetric facies perrl Moving all extremities Extubated, breathing easily Improving  Ventriculostomy is draining dark brownish red fluid, will not attempt to wean now.

## 2011-08-27 NOTE — Progress Notes (Signed)
8 Days Post-Op  Subjective: Anterior Comm Artery Aneurysm coiling 8/16 Alert today Off trach   Objective: Vital signs in last 24 hours: Temp:  [98.8 F (37.1 C)-100.2 F (37.9 C)] 98.8 F (37.1 C) (08/24 0900) Pulse Rate:  [74-92] 75  (08/24 0915) Resp:  [16-30] 30  (08/24 0915) BP: (155-203)/(66-106) 174/85 mmHg (08/24 0915) SpO2:  [99 %-100 %] 100 % (08/24 0915) FiO2 (%):  [29.9 %-30.4 %] 30.2 % (08/24 0800) Weight:  [230 lb 9.6 oz (104.6 kg)] 230 lb 9.6 oz (104.6 kg) (08/24 0020) Last BM Date: 08/27/11  Intake/Output from previous day: 08/23 0701 - 08/24 0700 In: 4551.8 [I.V.:3741.8; NG/GT:810] Out: 5525 [Urine:5090; Drains:335; Stool:100] Intake/Output this shift: Total I/O In: 180 [I.V.:150; NG/GT:30] Out: 878 [Urine:860; Drains:18]  PE:  Low grade temp 99.1; vss Follows few commands; follows me with eyes Blood tinged output ventric Moving all 4s  Lab Results:   Basename 08/27/11 0450 08/26/11 0405  WBC 14.8* 13.3*  HGB 10.5* 9.7*  HCT 32.5* 30.0*  PLT 250 217   BMET  Basename 08/27/11 0450 08/26/11 0405  NA 137 139  K 3.6 3.4*  CL 100 101  CO2 29 30  GLUCOSE 121* 141*  BUN 13 16  CREATININE 0.60 0.73  CALCIUM 9.2 8.8   PT/INR No results found for this basename: LABPROT:2,INR:2 in the last 72 hours ABG No results found for this basename: PHART:2,PCO2:2,PO2:2,HCO3:2 in the last 72 hours  Studies/Results: Dg Chest Port 1 View  08/27/2011  *RADIOLOGY REPORT*  Clinical Data: Respiratory failure  PORTABLE CHEST - 1 VIEW  Comparison: 08/26/2011  Findings: ET tube tip is above the carina.  There is a right arm PICC line with tip in the SVC.  Nasogastric tube tip is in the stomach.  The heart size is normal.  No pleural effusion or edema noted.  Lung volumes are very low.  IMPRESSION:  1.  Persistent low lung volumes. 2.  Stable support apparatus.   Original Report Authenticated By: Rosealee Albee, M.D.    Dg Chest Port 1 View  08/26/2011  *RADIOLOGY  REPORT*  Clinical Data: Atelectasis, respiratory failure.  PORTABLE CHEST - 1 VIEW  Comparison: 08/25/2011  Findings: Cardiomegaly. Right PICC line is retracted.  The tip is now in the SVC.  devices remain in place, unchanged.  Continued low lung volumes with perihilar atelectasis or edema.  No effusions.  IMPRESSION: Right PICC line has been retracted with the tip now in the SVC. Otherwise no significant change.   Original Report Authenticated By: Cyndie Chime, M.D.     Anti-infectives:   Assessment/Plan: s/p Procedure(s) (LRB): RADIOLOGY WITH ANESTHESIA (N/A)  A Com aneurysm coiled 8/16  will follow Will report to Dr Bradly Chris A 08/27/2011

## 2011-08-27 NOTE — Progress Notes (Signed)
Name: [name] MRN: [id number] DOB: 1950/04/02    LOS: 11  Referring Provider:  Venetia Maxon (nsgy) Reason for Referral:  ICU management   PULMONARY / CRITICAL CARE MEDICINE   Brief Summary:   61 yo with HTN Admitted for diffuse subarachnoid hemorrhage and aspiration pneumonia with VDRF.  S/p coil  7x63mm Acom aneurysm  Interval history:  Tol psv well.  F/c well.  Plan extubation today  TCDs better, not in vasospasm range  Events Since Admit: 8/13  Presented to [facility] with SAH, intubated, extubated to comfort but improved 8/15  Transferred to [facility] 8/16  IR, postop VDRF, IVC 8/18  Increased spontaneous movements on L; increased secretions; failed SBT 8/18 AM due to apnea; ongoing fever 8/22 TCDs better.  PSV cycle.  Still needs ETT  Vital Signs: Temp:  [98.8 F (37.1 C)-100.2 F (37.9 C)] 99.2 F (37.3 C) (08/24 0600) Pulse Rate:  [74-92] 84  (08/24 0700) Resp:  [16-26] 20  (08/24 0700) BP: (155-197)/(66-103) 189/97 mmHg (08/24 0700) SpO2:  [99 %-100 %] 100 % (08/24 0700) FiO2 (%):  [29.7 %-30.4 %] 30 % (08/24 0722) Weight:  [104.6 kg (230 lb 9.6 oz)] 104.6 kg (230 lb 9.6 oz) (08/24 0020)  Physical Examination: Gen: is more alert ETT in place, but is not a subglottic tube HEENT: IVC drain in place, c/d/i, no erythema PULM: clearer ,less secretions CV: RRR, no mgr AB: bS+, soft, nontender Ext: warm, 2+edema Neuro: much more alert. Now following commands  Principal Problem:  *Subarachnoid hemorrhage Active Problems:  Encephalopathy  Aspiration pneumonia  Acute respiratory failure  HTN (hypertension)  Vasospasm  ASSESSMENT AND PLAN  PULMONARY  Lab 08/22/11 0520 08/21/11 0440  PHART 7.403 7.314*  PCO2ART 39.6 48.6*  PO2ART 89.3 78.8*  HCO3 23.9 23.5  O2SAT 96.1 91.7   Ventilator Settings: Vent Mode:  [-] CPAP FiO2 (%):  [29.7 %-30.4 %] 30 % Set Rate:  [16 bmp] 16 bmp Vt Set:  [530 mL] 530 mL PEEP:  [4.2 cmH20-5 cmH20] 5 cmH20 Pressure Support:  [10 cmH20] 10  cmH20 Plateau Pressure:  [20 cmH20-22 cmH20] 20 cmH20 CXR:  8/22 >>>perihilar ATX, better ETT:   8/13>>8/14, 8/16 >>>  A:  Postop VDRF.    P:   Attempt extubation 8/24   CARDIOVASCULAR No results found for this basename: TROPONINI:5,LATICACIDVEN:5, O2SATVEN:5,PROBNP:5 in the last 168 hours Lines:   8/16  L Fem Sheath>>>8/19 8/16  R PICC >>>  A:  HTN, off cardene drip.  Allowing higher range SBP.  HHH in place.  CVP adequate Bp at goal   P:  Per Neurosurgery Add IV hydralazine need to run BP more toward 180-140 range Cont labetalol   RENAL  Lab 08/27/11 0450 08/26/11 0405 08/25/11 0520 08/24/11 1200 08/24/11 0430  NA 137 139 141 142 142  K 3.6 3.4* -- -- --  CL 100 101 100 101 101  CO2 29 30 32 34* 34*  BUN 13 16 15 15 14   CREATININE 0.60 0.73 0.75 0.81 0.77  CALCIUM 9.2 8.8 8.6 8.7 8.9  MG -- -- -- -- --  PHOS -- -- -- -- --   Foley:  8/13>>>  A: Hypokalemia.  Normal renal function.  P:   K repleted this AM by Pola Corn Trend BMP Supp KCL daily on schedule  GASTROINTESTINAL No results found for this basename: AST:5,ALT:5,ALKPHOS:5,BILITOT:5,PROT:5,ALBUMIN:5 in the last 168 hours A:  No acute issues. Loose stools , CDiff neg P:   Enteral tube feedings>>will stop with d/c  of OG , see if pt will swallow  HEMATOLOGIC  Lab 08/27/11 0450 08/26/11 0405 08/25/11 0520 08/24/11 0430 08/23/11 0510  HGB 10.5* 9.7* 10.0* 10.1* 10.2*  HCT 32.5* 30.0* 31.4* 31.6* 33.2*  PLT 250 217 233 229 245  INR -- -- -- -- --  APTT -- -- -- -- --   A:  No acute issue. P:  Trend CBC SCD  INFECTIOUS  Lab 08/27/11 0450 08/26/11 0405 08/25/11 0520 08/24/11 0430 08/23/11 0510  WBC 14.8* 13.3* 11.7* 11.3* 14.4*  PROCALCITON -- -- -- -- --   Cultures: 8/17 blood >>neg 8/17 urine >>neg 8/20 resp c/s>>neg 8/21 CDiff>>>neg  Antibiotics: Unasyn 8/13 >>>8/15 Cefazolin 8/15 >> 8/18 Vanc 8/18(asp pna) >>8/21 Zosyn 8/18 (asp pna)>>8/22   A: Fever/WBC better  P:    Hold ABX  for now   ENDOCRINE  Lab 08/26/11 2358 08/26/11 2147 08/26/11 2102 08/26/11 1545 08/26/11 1227  GLUCAP 161* 181* 154* 152* 176*    A:  Hyperglycemia. Better with Lantus and SSI  P:   SSI/CBG Cont  lantus  NEUROLOGIC  8/16 IVC Drain>>>  A:   Subarachnoid hemorrhage s/p aneurism coiling Acom aneurysm Acute encephalopathy, improved 8/18 but still not following commands, protecting airway Prognosis?   Has ischemic areas on R  internal capsule and peripherally R anterior cortex,   No vasospasm on current TCDs  P:   Per Neurosurgery Hold sedation   BEST PRACTICE / DISPOSITION Level of Care:  ICU Primary Service:  Neurosurgery Consultants:  Neurology, PCCM Code Status:  Full Diet:  TF DVT Px: SCD's GI Px:  Protonix Skin Integrity:  Intact Social / Family:  Updated at bedside  CC time 35 minutes   [name]  [phone]  Cell  [phone]  If no response or cell goes to voicemail, call beeper [phone]

## 2011-08-27 NOTE — Progress Notes (Signed)
eLink Physician-Brief Progress Note Patient Name: [name] DOB: Sep 19, 1950 MRN: [id number]  Date of Service  08/27/2011   HPI/Events of Note  agitated   eICU Interventions  Haldol x1   Intervention Category Intermediate Interventions: Other:  Sylis Ketchum 08/27/2011, 11:25 PM

## 2011-08-28 ENCOUNTER — Inpatient Hospital Stay (HOSPITAL_COMMUNITY): Payer: Medicaid Other

## 2011-08-28 LAB — GLUCOSE, CAPILLARY
Glucose-Capillary: 147 mg/dL — ABNORMAL HIGH (ref 70–99)
Glucose-Capillary: 149 mg/dL — ABNORMAL HIGH (ref 70–99)
Glucose-Capillary: 168 mg/dL — ABNORMAL HIGH (ref 70–99)
Glucose-Capillary: 209 mg/dL — ABNORMAL HIGH (ref 70–99)

## 2011-08-28 LAB — CBC
HCT: 33 % — ABNORMAL LOW (ref 36.0–46.0)
Hemoglobin: 10.6 g/dL — ABNORMAL LOW (ref 12.0–15.0)
MCH: 26 pg (ref 26.0–34.0)
MCHC: 32.1 g/dL (ref 30.0–36.0)
MCV: 81.1 fL (ref 78.0–100.0)
RDW: 16.7 % — ABNORMAL HIGH (ref 11.5–15.5)

## 2011-08-28 LAB — BASIC METABOLIC PANEL
Chloride: 98 mEq/L (ref 96–112)
GFR calc Af Amer: >90 mL/min (ref >90)
GFR calc non Af Amer: 90 mL/min — ABNORMAL LOW (ref >90)
Potassium: 3.9 mEq/L (ref 3.5–5.1)
Sodium: 135 mEq/L (ref 135–145)

## 2011-08-28 MED ORDER — JEVITY 1.2 CAL PO LIQD
1000.0000 mL | ORAL | Status: DC
  Administered 2011-08-28: 1000 mL
  Filled 2011-08-28 (×3): qty 1000

## 2011-08-28 MED ORDER — CHLORHEXIDINE GLUCONATE 0.12 % MT SOLN
15.0000 mL | Freq: Two times a day (BID) | OROMUCOSAL | Status: DC
  Administered 2011-08-29 – 2011-09-08 (×20): 15 mL via OROMUCOSAL
  Filled 2011-08-28 (×23): qty 15

## 2011-08-28 MED ORDER — CHLORHEXIDINE GLUCONATE 0.12 % MT SOLN
OROMUCOSAL | Status: AC
  Administered 2011-08-28: 15 mL
  Filled 2011-08-28: qty 15

## 2011-08-28 MED ORDER — BIOTENE DRY MOUTH MT LIQD
15.0000 mL | Freq: Two times a day (BID) | OROMUCOSAL | Status: DC
  Administered 2011-08-29 – 2011-09-07 (×16): 15 mL via OROMUCOSAL

## 2011-08-28 MED ORDER — HYDROCODONE-ACETAMINOPHEN 7.5-500 MG/15ML PO SOLN
10.0000 mL | ORAL | Status: DC | PRN
  Administered 2011-08-28: 10 mL via ORAL
  Filled 2011-08-28 (×2): qty 15

## 2011-08-28 NOTE — Progress Notes (Signed)
SLP Cancellation Note  Attempt x2 for diagnostic treatment to reassess swallow for PO readiness and not completed as MD in room with patient.  ST to address on 08/29/11.  Moreen Fowler MS, CCC-SLP [phone] [facility] 08/28/2011, 12:57 PM

## 2011-08-28 NOTE — Progress Notes (Signed)
Patient ID: [name], female   DOB: 1950-10-10, 61 y.o.   MRN: [id number] BP 159/77  Pulse 89  Temp 98.6 F (37 C) (Oral)  Resp 34  Ht 5\' 9"  (1.753 m)  Wt 101.3 kg (223 lb 5.2 oz)  BMI 32.98 kg/m2  SpO2 99% Lethargic, following some commands Moving all extremities Perrl, full eom Symmetric facies Speech clear paucity of speech today Head ct reviewed, ventricles small, sah mostly dissapated Will not replace ventric which was pulled out during the ct inadvertantly for now.  Breathing comfortably

## 2011-08-28 NOTE — Progress Notes (Signed)
Name: [name] MRN: [id number] DOB: 07/05/1950    LOS: 12  Referring Provider:  Venetia Maxon (nsgy) Reason for Referral:  ICU management   PULMONARY / CRITICAL CARE MEDICINE   Brief Summary:   61 yo with HTN Admitted for diffuse subarachnoid hemorrhage and aspiration pneumonia with VDRF.  S/p coil  7x65mm Acom aneurysm  Interval history:  Agitated overnight, was extubated 8/24.  Pulled out IVC drain with CT HEad trip  Events Since Admit: 8/13  [facility] to [facility] with SAH, intubated, extubated to comfort but improved 8/15  Transferred to [facility] 8/16  IR, postop VDRF, IVC 8/18  Increased spontaneous movements on L; increased secretions; failed SBT 8/18 AM due to apnea; ongoing fever 8/22 TCDs better.  PSV cycle.  Still needs ETT  Vital Signs: Temp:  [98.4 F (36.9 C)-99.6 F (37.6 C)] 98.6 F (37 C) (08/25 0400) Pulse Rate:  [66-99] 89  (08/25 0700) Resp:  [18-34] 34  (08/25 0700) BP: (95-218)/(58-112) 159/77 mmHg (08/25 0700) SpO2:  [91 %-100 %] 99 % (08/25 0700) Weight:  [101.3 kg (223 lb 5.2 oz)] 101.3 kg (223 lb 5.2 oz) (08/25 0500)  Physical Examination: Gen: agitated at times, ETT out  On RA HEENT: IVC drain out,  c/d/i, no erythema PULM: clearerCV: RRR, no mgr AB: bS+, soft, nontender Ext: warm, 2+edema Neuro: not following commands today,  Agitated at times ?pain  Principal Problem:  *Subarachnoid hemorrhage Active Problems:  Encephalopathy  Aspiration pneumonia  Acute respiratory failure  HTN (hypertension)  Vasospasm  ASSESSMENT AND PLAN  PULMONARY  Lab 08/22/11 0520  PHART 7.403  PCO2ART 39.6  PO2ART 89.3  HCO3 23.9  O2SAT 96.1   Ventilator Settings:   CXR:  8/25: perihilar ATX ETT:   8/13>>8/14, 8/16 >>>8/24  A:  Postop VDRF. Resolved but high risk needing vent again   P:   pulm toilet    CARDIOVASCULAR No results found for this basename: TROPONINI:5,LATICACIDVEN:5, O2SATVEN:5,PROBNP:5 in the last 168 hours Lines:   8/16  L Fem  Sheath>>>8/19 8/16  R PICC >>>  A:  HTN, off cardene drip.  Allowing higher range SBP.  HHH in place.  CVP adequate Bp at goal   P:  Per Neurosurgery Cont IV hydralazine need to run BP more toward 180-140 range Cont labetalol   RENAL  Lab 08/28/11 0515 08/27/11 0450 08/26/11 0405 08/25/11 0520 08/24/11 1200  NA 135 137 139 141 142  K 3.9 3.6 -- -- --  CL 98 100 101 100 101  CO2 27 29 30  32 34*  BUN 16 13 16 15 15   CREATININE 0.71 0.60 0.73 0.75 0.81  CALCIUM 9.4 9.2 8.8 8.6 8.7  MG -- -- -- -- --  PHOS -- -- -- -- --   Foley:  8/13>>>  A: Hypokalemia improved.  Normal renal function.  P:   Trend BMP  GASTROINTESTINAL No results found for this basename: AST:5,ALT:5,ALKPHOS:5,BILITOT:5,PROT:5,ALBUMIN:5 in the last 168 hours A:  No acute issues P:   Failed swallow 8/24. Resume TF   HEMATOLOGIC  Lab 08/28/11 0515 08/27/11 0450 08/26/11 0405 08/25/11 0520 08/24/11 0430  HGB 10.6* 10.5* 9.7* 10.0* 10.1*  HCT 33.0* 32.5* 30.0* 31.4* 31.6*  PLT 308 250 217 233 229  INR -- -- -- -- --  APTT -- -- -- -- --   A:  No acute issue. P:  Trend CBC SCD  INFECTIOUS  Lab 08/28/11 0515 08/27/11 0450 08/26/11 0405 08/25/11 0520 08/24/11 0430  WBC 14.9* 14.8* 13.3* 11.7*  11.3*  PROCALCITON -- -- -- -- --   Cultures: 8/17 blood >>neg 8/17 urine >>neg 8/20 resp c/s>>neg 8/21 CDiff>>>neg  Antibiotics: Unasyn 8/13 >>>8/15 Cefazolin 8/15 >> 8/18 Vanc 8/18(asp pna) >>8/21 Zosyn 8/18 (asp pna)>>8/22   A: Fever/WBC better, off all ABX P:    Hold ABX for now   ENDOCRINE  Lab 08/28/11 0841 08/28/11 0332 08/28/11 0022 08/27/11 1921 08/27/11 1509  GLUCAP 150* 132* 115* 139* 146*    A:  Hyperglycemia. Better with Lantus and SSI  P:   SSI/CBG Off lantus off TF  NEUROLOGIC  8/16 IVC Drain>>>pulled out accidentally 8/25 early AM with CT head trip.   A:   Subarachnoid hemorrhage s/p aneurism coiling Acom aneurysm Acute encephalopathy, improved 8/18 but still not  following commands, protecting airway Prognosis?   Has ischemic areas on R  internal capsule and peripherally R anterior cortex,   No vasospasm on current TCDs  P:   Per Neurosurgery    BEST PRACTICE / DISPOSITION Level of Care:  ICU Primary Service:  Neurosurgery Consultants:  Neurology, PCCM Code Status:  Full Diet:  TF to resume 8/25 DVT Px: SCD's GI Px:  Protonix Skin Integrity:  Intact Social / Family:  Updated at bedside  CC time 35 minutes   [name]  [phone]  Cell  [phone]  If no response or cell goes to voicemail, call beeper [phone]

## 2011-08-29 ENCOUNTER — Inpatient Hospital Stay (HOSPITAL_COMMUNITY): Payer: Medicaid Other

## 2011-08-29 LAB — GLUCOSE, CAPILLARY
Glucose-Capillary: 156 mg/dL — ABNORMAL HIGH (ref 70–99)
Glucose-Capillary: 171 mg/dL — ABNORMAL HIGH (ref 70–99)
Glucose-Capillary: 200 mg/dL — ABNORMAL HIGH (ref 70–99)
Glucose-Capillary: 152 mg/dL — ABNORMAL HIGH (ref 70–99)

## 2011-08-29 LAB — BASIC METABOLIC PANEL
Chloride: 107 mEq/L (ref 96–112)
GFR calc Af Amer: 83 mL/min — ABNORMAL LOW (ref >90)
GFR calc non Af Amer: 71 mL/min — ABNORMAL LOW (ref >90)
Potassium: 4.2 mEq/L (ref 3.5–5.1)

## 2011-08-29 LAB — CBC
HCT: 33.2 % — ABNORMAL LOW (ref 36.0–46.0)
Hemoglobin: 10.6 g/dL — ABNORMAL LOW (ref 12.0–15.0)
MCHC: 31.9 g/dL (ref 30.0–36.0)
RDW: 17.3 % — ABNORMAL HIGH (ref 11.5–15.5)
WBC: 14.3 10*3/uL — ABNORMAL HIGH (ref 4.0–10.5)

## 2011-08-29 MED ORDER — JEVITY 1.2 CAL PO LIQD
1000.0000 mL | ORAL | Status: DC
  Administered 2011-08-29 – 2011-09-01 (×3): 1000 mL
  Administered 2011-09-03: 10:00:00
  Administered 2011-09-04: 1000 mL
  Filled 2011-08-29 (×13): qty 1000

## 2011-08-29 MED ORDER — INSULIN GLARGINE 100 UNIT/ML ~~LOC~~ SOLN
10.0000 [IU] | Freq: Every day | SUBCUTANEOUS | Status: DC
  Administered 2011-08-29 – 2011-09-07 (×10): 10 [IU] via SUBCUTANEOUS

## 2011-08-29 MED ORDER — PRO-STAT SUGAR FREE PO LIQD
30.0000 mL | Freq: Four times a day (QID) | ORAL | Status: DC
  Administered 2011-08-29 – 2011-09-07 (×32): 30 mL
  Filled 2011-08-29 (×44): qty 30

## 2011-08-29 NOTE — Progress Notes (Signed)
Nutrition Follow-up/Consult  Intervention:   1. To better meet nutrition needs, recommend Jevity 1.2 @ 25 ml/hr via NGT and increase by 10 ml every 4 hours to goal rate of 45 ml/hr. 30 ml Prostat 4 times daily. At goal rate, tube feeding regimen will provide 1696 kcal, 120 grams of protein, and 872 ml of H2O.  2. If no IV fluids, pt will need additional free water fluids to maintain hydration.  3. RD will continue to follow   Assessment:   Patient extubated 8/24. BSE completed 8/24 recommending NPO secondary to s/s present during evaluation with patient's decreased ability to protect airway with PO's. RD consulted for initiation/management of enteral nutrition via Adult Enteral Nutrition Protocol.  RN reports pt is currently tolerating TF via NGT well at this time.  Diet Order:  NPO TF: Jevity 1.2 at 40 ml/hr - provides 1152 kcal, 54 grams protein, 775 ml free water  Meds: Scheduled Meds:   . antiseptic oral rinse  15 mL Mouth Rinse q12n4p  . chlorhexidine  15 mL Mouth Rinse BID  . chlorhexidine      . hydrALAZINE  20 mg Intravenous Q8H  . insulin aspart  0-15 Units Subcutaneous Q4H  . labetalol  200 mg Per Tube TID  . levETIRAcetam  500 mg Per Tube BID  . NiMODipine  60 mg Per Tube Q4H  . pantoprazole sodium  40 mg Per Tube Q1200  . potassium chloride  40 mEq Per Tube BID   Continuous Infusions:   . sodium chloride 20 mL/hr at 08/28/11 1235  . feeding supplement (JEVITY 1.2 CAL) 1,000 mL (08/28/11 1143)   PRN Meds:.albuterol, fentaNYL, HYDROcodone-acetaminophen, ipratropium, labetalol, ondansetron (ZOFRAN) IV, oxymetazoline  Labs:  CMP     Component Value Date/Time   NA 142 08/29/2011 0400   K 4.2 08/29/2011 0400   CL 107 08/29/2011 0400   CO2 25 08/29/2011 0400   GLUCOSE 175* 08/29/2011 0400   BUN 24* 08/29/2011 0400   CREATININE 0.85 08/29/2011 0400   CALCIUM 9.7 08/29/2011 0400   PROT 7.9 08/18/2011 1336   ALBUMIN 3.0* 08/18/2011 1336   AST 28 08/18/2011 1336   ALT 16  08/18/2011 1336   ALKPHOS 75 08/18/2011 1336   BILITOT 0.6 08/18/2011 1336   GFRNONAA 71* 08/29/2011 0400   GFRAA 83* 08/29/2011 0400   Sodium  Date/Time Value Range Status  08/29/2011  4:00 AM 142  135 - 145 mEq/L Final  08/28/2011  5:15 AM 135  135 - 145 mEq/L Final  08/27/2011  4:50 AM 137  135 - 145 mEq/L Final    Potassium  Date/Time Value Range Status  08/29/2011  4:00 AM 4.2  3.5 - 5.1 mEq/L Final  08/28/2011  5:15 AM 3.9  3.5 - 5.1 mEq/L Final  08/27/2011  4:50 AM 3.6  3.5 - 5.1 mEq/L Final    No results found for this basename: phos    No results found for this basename: mg   CBG (last 3)   Basename 08/29/11 0842 08/29/11 0410 08/28/11 2358  GLUCAP 171* 156* 209*     Intake/Output Summary (Last 24 hours) at 08/29/11 0922 Last data filed at 08/29/11 0700  Gross per 24 hour  Intake    900 ml  Output   1430 ml  Net   -530 ml  BM 8/25  Ht: 5'9"  Weight Status:  98.4 kg/216 lb - trending down  Body mass index is 32.04 kg/(m^2). Obese Class I.  Re-estimated needs:  1600 -  1800 kcal, 115-130 gm protein   Nutrition Dx:  Inadequate oral intake r/t inability to eat AEB NPO status. Ongoing.  Goal:  Enteral nutrition to provide at least 90% of estimated energy needs. Unmet.  Monitor:  TF initiation/tolerance, weights, labs, I/O's, vent settings  Jarold Motto MS, RD, LDN Pager: [phone] After-hours pager: [phone]

## 2011-08-29 NOTE — Progress Notes (Addendum)
Name: [name] MRN: [id number] DOB: Oct 13, 1950    LOS: 13  Referring Provider:  Venetia Maxon (nsgy) Reason for Referral:  ICU management   PULMONARY / CRITICAL CARE MEDICINE  Brief Summary:   61 yo with HTN Admitted for diffuse subarachnoid hemorrhage and aspiration pneumonia with VDRF.  S/p coil  7x44mm Acom aneurysm  Interval history:  Weak cough reported  Did not follow commands this am  Events Since Admit: 8/13  Presented to [facility] with SAH, intubated, extubated to comfort but improved 8/15  Transferred to [facility] 8/16  IR, postop VDRF, IVC 8/18  Increased spontaneous movements on L; increased secretions; failed SBT 8/18 AM due to apnea; ongoing fever 8/22 TCDs better.  PSV cycle.  Still needs ETT  Vital Signs: Temp:  [97.3 F (36.3 C)-99.1 F (37.3 C)] 97.8 F (36.6 C) (08/26 0800) Pulse Rate:  [62-102] 82  (08/26 0800) Resp:  [23-35] 27  (08/26 0800) BP: (113-178)/(48-138) 156/77 mmHg (08/26 0800) SpO2:  [94 %-99 %] 97 % (08/26 0800) Weight:  [98.4 kg (216 lb 14.9 oz)] 98.4 kg (216 lb 14.9 oz) (08/26 0457)  Intake/Output Summary (Last 24 hours) at 08/29/11 0931 Last data filed at 08/29/11 0700  Gross per 24 hour  Intake    900 ml  Output   1430 ml  Net   -530 ml    Physical Examination: Gen: slightly tachypneic, comfortable otherwise HEENT: IVC drain out,  c/d/i, no erythema PULM: decreased bases, otherwise clear CV: RRR, no mgr AB: bS+, soft, nontender Ext: warm, 2+edema Neuro: not following commands today, moves RUE > LUE  Principal Problem:  *Subarachnoid hemorrhage Active Problems:  Encephalopathy  Aspiration pneumonia  Acute respiratory failure  HTN (hypertension)  Vasospasm  ASSESSMENT AND PLAN  PULMONARY No results found for this basename: PHART:5,PCO2:5,PCO2ART:5,PO2ART:5,HCO3:5,O2SAT:5 in the last 168 hours Ventilator Settings:   CXR:  8/26: perihilar and basilar ATX ETT:   8/13>>8/14, 8/16 >>>8/24  A:  Postop VDRF. Resolved but high risk  needing vent again P:   pulm toilet  CARDIOVASCULAR No results found for this basename: TROPONINI:5,LATICACIDVEN:5, O2SATVEN:5,PROBNP:5 in the last 168 hours Lines:   8/16  L Fem Sheath>>>8/19 8/16  R PICC >>>  A:  HTN, off cardene drip.  CVP adequate BP at goal   P:  Per Neurosurgery Cont IV hydralazine need to run BP more toward 180-140 range Cont labetalol   RENAL  Lab 08/29/11 0400 08/28/11 0515 08/27/11 0450 08/26/11 0405 08/25/11 0520  NA 142 135 137 139 141  K 4.2 3.9 -- -- --  CL 107 98 100 101 100  CO2 25 27 29 30  32  BUN 24* 16 13 16 15   CREATININE 0.85 0.71 0.60 0.73 0.75  CALCIUM 9.7 9.4 9.2 8.8 8.6  MG -- -- -- -- --  PHOS -- -- -- -- --   Foley:  8/13>>>  A: Hypokalemia improved.  Normal renal function.  P:   Trend BMP  GASTROINTESTINAL No results found for this basename: AST:5,ALT:5,ALKPHOS:5,BILITOT:5,PROT:5,ALBUMIN:5 in the last 168 hours A:  No acute issues P:   On TF, speech rx following but suspect she is not ready for PO diet   HEMATOLOGIC  Lab 08/29/11 0400 08/28/11 0515 08/27/11 0450 08/26/11 0405 08/25/11 0520  HGB 10.6* 10.6* 10.5* 9.7* 10.0*  HCT 33.2* 33.0* 32.5* 30.0* 31.4*  PLT 337 308 250 217 233  INR -- -- -- -- --  APTT -- -- -- -- --   A:  No acute issue. P:  Trend CBC SCD  INFECTIOUS  Lab 08/29/11 0400 08/28/11 0515 08/27/11 0450 08/26/11 0405 08/25/11 0520  WBC 14.3* 14.9* 14.8* 13.3* 11.7*  PROCALCITON -- -- -- -- --   Cultures: 8/17 blood >>neg 8/17 urine >>neg 8/20 resp c/s>>neg 8/21 CDiff>>>neg  Antibiotics: Unasyn 8/13 >>>8/15 Cefazolin 8/15 >> 8/18 Vanc 8/18(asp pna) >>8/21 Zosyn 8/18 (asp pna)>>8/22  A: No active issues P:   No abx at this time   ENDOCRINE  Lab 08/29/11 0842 08/29/11 0410 08/28/11 2358 08/28/11 1919 08/28/11 1550  GLUCAP 171* 156* 209* 168* 147*    A:  Hyperglycemia. Better with Lantus and SSI  P:   SSI/CBG Off lantus when off TF, add back 8/26  NEUROLOGIC  8/16 IVC  Drain>>>pulled out accidentally 8/25 early AM with CT head trip.   A:   Subarachnoid hemorrhage s/p aneurism coiling Acom aneurysm Acute encephalopathy, improved 8/18 but still not following commands Prognosis?   Has ischemic areas on R  internal capsule and peripherally R anterior cortex,   No vasospasm on current TCDs P:   Per Neurosurgery Nimodipine as ordered, ? Duration, will refer to NSGY  CC time 30 minutes  BEST PRACTICE / DISPOSITION Level of Care:  ICU Primary Service:  Neurosurgery Consultants:  Neurology, PCCM, speech rx Code Status:  Full Diet:  TF to resumed 8/25 DVT Px: SCD's GI Px:  Protonix Skin Integrity:  Intact Social / Family:  Updated at bedside  Levy Pupa, MD, PhD 08/29/2011, 9:44 AM [facility] [phone] or if no answer [phone]

## 2011-08-29 NOTE — Progress Notes (Signed)
VASCULAR LAB PRELIMINARY  PRELIMINARY  PRELIMINARY  PRELIMINARY  TCD completed.    Preliminary report:  TCD completed results found with results from 08/24/11.  Jalynne Persico, RVS 08/29/2011, 3:37 PM

## 2011-08-29 NOTE — Progress Notes (Signed)
Speech Language Pathology Dysphagia Treatment Patient Details Name: [name] MRN: [id number] DOB: 04-03-1950 Today's Date: 08/29/2011 Time: 9147-8295 SLP Time Calculation (min): 8 min  Assessment / Plan / Recommendation Clinical Impression  Pt. was seen for dysphagia treatment for ability to initiate POs. During oral phase, anterior loss of bolus was observed. Manipulation of bolus was poor with increased oral transit time. Pt. is able to initiate swallow with maximal prompting. Pt. exhibited throat clear/cough immediately after bolus consumption thin and puree consistencies. Pt.'s cognitive deficits would inhibit adequate nutritional status. Therefore not ready for objective status at this time. When asked, Pt. is able to state her name- low volume vocal quality was observed. ST will f/u tomorrow to check for readiness of formal swallow study. Continue NPO.      Diet Recommendation  Continue with Current Diet: NPO    SLP Plan Continue with current plan of care      Swallowing Goals  SLP Swallowing Goals Goal #3: Patient will consume PO trials of various consistencies administered by SLP only with no observed s/s of aspiration with mod verbal cues.   Swallow Study Goal #3 - Progress: Progressing toward goal  General Behavior/Cognition: Confused;Distractible;Lethargic;Requires cueing;Decreased sustained attention;Alert Oral Cavity - Dentition: Edentulous Patient Positioning: Upright in bed  Oral Cavity - Oral Hygiene Does patient have any of the following "at risk" factors?: Lips - dry, cracked;Tongue - coated;Other - dysphagia Brush patient's teeth BID with toothbrush (using toothpaste with fluoride): Yes Patient is HIGH RISK - Oral Care Protocol followed (see row info): Yes Patient is AT RISK - Oral Care Protocol followed (see row info): Yes   Dysphagia Treatment Treatment focused on: Skilled observation of diet tolerance;Patient/family/caregiver education;Upgraded PO texture  trials Treatment Methods/Modalities: Skilled observation Patient observed directly with PO's: Yes Type of PO's observed: Ice chips;Dysphagia 1 (puree) Feeding: Total assist Liquids provided via: Teaspoon Oral Phase Signs & Symptoms: Anterior loss/spillage Pharyngeal Phase Signs & Symptoms: Immediate throat clear;Immediate cough Type of cueing: Verbal Amount of cueing: Maximal   GO     Theotis Burrow 08/29/2011, 11:58 AM

## 2011-08-29 NOTE — Progress Notes (Signed)
Subjective: Pt doing better. Ventric out 2 days ago.   Objective: Physical Exam: BP 156/77  Pulse 82  Temp 97.8 F (36.6 C) (Oral)  Resp 27  Ht 5\' 9"  (1.753 m)  Wt 216 lb 14.9 oz (98.4 kg)  BMI 32.04 kg/m2  SpO2 97% Did not speak to me. Did follow some commands. Moving a 4 ext. (L)groin without hematoma. Excellent distal pulses   Labs: CBC  Basename 08/29/11 0400 08/28/11 0515  WBC 14.3* 14.9*  HGB 10.6* 10.6*  HCT 33.2* 33.0*  PLT 337 308   BMET  Basename 08/29/11 0400 08/28/11 0515  NA 142 135  K 4.2 3.9  CL 107 98  CO2 25 27  GLUCOSE 175* 148*  BUN 24* 16  CREATININE 0.85 0.71  CALCIUM 9.7 9.4   LFT No results found for this basename: PROT,ALBUMIN,AST,ALT,ALKPHOS,BILITOT,BILIDIR,IBILI,LIPASE in the last 72 hours PT/INR No results found for this basename: LABPROT:2,INR:2 in the last 72 hours   Studies/Results: Ct Head Wo Contrast  08/28/2011  *RADIOLOGY REPORT*  Clinical Data: Follow-up subarachnoid hemorrhage.  CT HEAD WITHOUT CONTRAST  Technique:  Contiguous axial images were obtained from the base of the skull through the vertex without contrast.  Comparison: Multiple priors, most recent 08/19 1013  Findings: Ventricles remain well decompressed following right frontal ventriculostomy placement.  Evolving right ACA territory infarct with increasingly well demarcated hypodensity of the inferior and medial right frontal lobe.  No new subarachnoid hemorrhage.  Hounsfield artifact from the coil mass in the region of the left anterior communicating artery.  Mild edema surrounds the ventriculostomy tube.  Slight hypodensity left frontal white matter stable.  Mild dependent fluid in the sphenoid.  Calvarium otherwise intact.  IMPRESSION: The ventricles remain well decompressed.  No new subarachnoid hemorrhage or significant worsening from priors.   Original Report Authenticated By: Elsie Stain, M.D.    Dg Chest Port 1 View  08/29/2011  *RADIOLOGY REPORT*  Clinical  Data: Atelectasis, subarachnoid hemorrhage  PORTABLE CHEST - 1 VIEW  Comparison: Prior chest x-ray obtained yesterday, eight 02/28/2011  Findings: Unchanged position of enteric feeding tube.  The metallic weighted tip projects over the gastric fundus.  Right upper extremity PICC, also in unchanged position with the tip at the superior cavoatrial junction.  Low inspiratory volumes with bilateral perihilar and bibasilar atelectasis.  Negative for edema, or new focal airspace consolidation.  No pleural effusion. Unchanged mild cardiomegaly exaggerated by low lung volumes and portable technique.  IMPRESSION:  1.  Stable support apparatus as above 2.  Persistent low inspiratory volumes with mild perihilar and bibasilar atelectasis.   Original Report Authenticated By: Alvino Blood Chest Port 1 View  08/28/2011  *RADIOLOGY REPORT*  Clinical Data: Atelectasis.  PORTABLE CHEST - 1 VIEW  Comparison: 08/27/2011  Findings: Endotracheal tube has been removed.  There is a feeding tube with the tip near the gastric fundus.  PICC line tip in the SVC region.  Again noted is an enlarged heart.  No focal airspace disease.  Negative for a pneumothorax.  IMPRESSION: Support apparatuses as described.  Cardiomegaly without focal chest disease.   Original Report Authenticated By: Richarda Overlie, M.D.    Dg Abd Portable 1v  08/27/2011  *RADIOLOGY REPORT*  Clinical Data: Feeding tube placement.  PORTABLE ABDOMEN - 1 VIEW  Comparison: None  Findings: A small bore feeding tube is identified coiled in the proximal - mid stomach. The bowel gas pattern is unremarkable. No acute bony abnormalities are present.  IMPRESSION:  Small bore feeding tube coiled in the proximal - mid stomach.   Original Report Authenticated By: Rosendo Gros, M.D.     Assessment/Plan: A Com aneurysm coiled 8/16   Will report to Dr Corliss Skains Not much to offer from our standpoint Will follow peripherally.    LOS: 13 days    Brayton El PA-C 08/29/2011 11:16  AM

## 2011-08-29 NOTE — Progress Notes (Signed)
Agree with SLP's note below.  Breck Coons [place].Ed [employer] [phone]  08/29/2011

## 2011-08-29 NOTE — Progress Notes (Signed)
Subjective: Patient reports improving  Objective: Vital signs in last 24 hours: Temp:  [97.3 F (36.3 C)-99.1 F (37.3 C)] 97.8 F (36.6 C) (08/26 0800) Pulse Rate:  [62-102] 82  (08/26 0800) Resp:  [23-35] 27  (08/26 0800) BP: (113-178)/(48-138) 156/77 mmHg (08/26 0800) SpO2:  [94 %-99 %] 97 % (08/26 0800) Weight:  [98.4 kg (216 lb 14.9 oz)] 98.4 kg (216 lb 14.9 oz) (08/26 0457)  Intake/Output from previous day: 08/25 0701 - 08/26 0700 In: 940 [I.V.:480; NG/GT:460] Out: 1455 [Urine:1430; Drains:25] Intake/Output this shift:    Physical Exam: Opens eyes, attends, not currently speaking, but has been with nurses.  MAEW  Lab Results:  Basename 08/29/11 0400 08/28/11 0515  WBC 14.3* 14.9*  HGB 10.6* 10.6*  HCT 33.2* 33.0*  PLT 337 308   BMET  Basename 08/29/11 0400 08/28/11 0515  NA 142 135  K 4.2 3.9  CL 107 98  CO2 25 27  GLUCOSE 175* 148*  BUN 24* 16  CREATININE 0.85 0.71  CALCIUM 9.7 9.4    Studies/Results: Ct Head Wo Contrast  08/28/2011  *RADIOLOGY REPORT*  Clinical Data: Follow-up subarachnoid hemorrhage.  CT HEAD WITHOUT CONTRAST  Technique:  Contiguous axial images were obtained from the base of the skull through the vertex without contrast.  Comparison: Multiple priors, most recent 08/19 1013  Findings: Ventricles remain well decompressed following right frontal ventriculostomy placement.  Evolving right ACA territory infarct with increasingly well demarcated hypodensity of the inferior and medial right frontal lobe.  No new subarachnoid hemorrhage.  Hounsfield artifact from the coil mass in the region of the left anterior communicating artery.  Mild edema surrounds the ventriculostomy tube.  Slight hypodensity left frontal white matter stable.  Mild dependent fluid in the sphenoid.  Calvarium otherwise intact.  IMPRESSION: The ventricles remain well decompressed.  No new subarachnoid hemorrhage or significant worsening from priors.   Original Report  Authenticated By: Elsie Stain, M.D.    Dg Chest Port 1 View  08/29/2011  *RADIOLOGY REPORT*  Clinical Data: Atelectasis, subarachnoid hemorrhage  PORTABLE CHEST - 1 VIEW  Comparison: Prior chest x-ray obtained yesterday, eight 02/28/2011  Findings: Unchanged position of enteric feeding tube.  The metallic weighted tip projects over the gastric fundus.  Right upper extremity PICC, also in unchanged position with the tip at the superior cavoatrial junction.  Low inspiratory volumes with bilateral perihilar and bibasilar atelectasis.  Negative for edema, or new focal airspace consolidation.  No pleural effusion. Unchanged mild cardiomegaly exaggerated by low lung volumes and portable technique.  IMPRESSION:  1.  Stable support apparatus as above 2.  Persistent low inspiratory volumes with mild perihilar and bibasilar atelectasis.   Original Report Authenticated By: Alvino Blood Chest Port 1 View  08/28/2011  *RADIOLOGY REPORT*  Clinical Data: Atelectasis.  PORTABLE CHEST - 1 VIEW  Comparison: 08/27/2011  Findings: Endotracheal tube has been removed.  There is a feeding tube with the tip near the gastric fundus.  PICC line tip in the SVC region.  Again noted is an enlarged heart.  No focal airspace disease.  Negative for a pneumothorax.  IMPRESSION: Support apparatuses as described.  Cardiomegaly without focal chest disease.   Original Report Authenticated By: Richarda Overlie, M.D.    Dg Abd Portable 1v  08/27/2011  *RADIOLOGY REPORT*  Clinical Data: Feeding tube placement.  PORTABLE ABDOMEN - 1 VIEW  Comparison: None  Findings: A small bore feeding tube is identified coiled in the proximal - mid  stomach. The bowel gas pattern is unremarkable. No acute bony abnormalities are present.  IMPRESSION: Small bore feeding tube coiled in the proximal - mid stomach.   Original Report Authenticated By: Rosendo Gros, M.D.     Assessment/Plan: Mobilize, observe, CT in am to r/o HCP.    LOS: 13 days    [name]  D, MD 08/29/2011, 9:58 AM

## 2011-08-29 NOTE — Progress Notes (Signed)
Pt bladder scanned, bladder scan showed 335 ml. Performed I&O cath, pt put out 500 cc. Will continue to monitor.

## 2011-08-29 NOTE — Progress Notes (Signed)
Pt bladder scanned, bladder scan showed 600 ml. Performed I&O cath, pt put out 650 cc. Will continue to monitor.

## 2011-08-30 ENCOUNTER — Inpatient Hospital Stay (HOSPITAL_COMMUNITY): Payer: Medicaid Other

## 2011-08-30 LAB — CBC
MCV: 82.1 fL (ref 78.0–100.0)
Platelets: 343 10*3/uL (ref 150–400)
RDW: 17.7 % — ABNORMAL HIGH (ref 11.5–15.5)
WBC: 13.6 10*3/uL — ABNORMAL HIGH (ref 4.0–10.5)

## 2011-08-30 LAB — GLUCOSE, CAPILLARY
Glucose-Capillary: 149 mg/dL — ABNORMAL HIGH (ref 70–99)
Glucose-Capillary: 160 mg/dL — ABNORMAL HIGH (ref 70–99)
Glucose-Capillary: 170 mg/dL — ABNORMAL HIGH (ref 70–99)

## 2011-08-30 LAB — BASIC METABOLIC PANEL
CO2: 24 mEq/L (ref 19–32)
Calcium: 9.6 mg/dL (ref 8.4–10.5)
Creatinine, Ser: 0.74 mg/dL (ref 0.50–1.10)
GFR calc Af Amer: >90 mL/min (ref >90)

## 2011-08-30 NOTE — Evaluation (Signed)
Physical Therapy Evaluation Patient Details Name: [name] MRN: [id number] DOB: [birth year] Today's Date: 08/30/2011 Time: 9629-5284 PT Time Calculation (min): 21 min  PT Assessment / Plan / Recommendation Clinical Impression  pt adm after suffering a SAH arriving at an unresponsive level.  On reeval 2 weeks later, pt is significantly weak with little trunk control and little tolerance for activity.  Pt will likely need SNF for rehab.  Will continue to see her on acute.    PT Assessment  Patient needs continued PT services    Follow Up Recommendations  Skilled nursing facility    Barriers to Discharge None      Equipment Recommendations  Defer to next venue    Recommendations for Other Services Other (comment) (will need OT soon, but may not be ready yet)   Frequency Min 3X/week    Precautions / Restrictions Precautions Precautions: Fall Restrictions Weight Bearing Restrictions: No   Pertinent Vitals/Pain       Mobility  Bed Mobility Bed Mobility: Rolling Left;Left Sidelying to Sit;Sit to Supine;Sitting - Scoot to Edge of Bed Rolling Left: 1: +2 Total assist Rolling Left: Patient Percentage: 40% Left Sidelying to Sit: 1: +2 Total assist Left Sidelying to Sit: Patient Percentage: 30% Sitting - Scoot to Edge of Bed: 1: +1 Total assist Sit to Supine: 1: +2 Total assist Sit to Supine: Patient Percentage: 0% Details for Bed Mobility Assistance: v/tc's for technique and initiation; truncal assist to come up Transfers Transfers: Not assessed Ambulation/Gait Ambulation/Gait Assistance: Not tested (comment) Stairs: No Wheelchair Mobility Wheelchair Mobility: No    Exercises     PT Diagnosis: Hemiplegia dominant side;Altered mental status;Generalized weakness  PT Problem List: Decreased strength;Decreased activity tolerance;Decreased balance;Decreased mobility;Decreased coordination;Decreased safety awareness;Decreased knowledge of precautions PT Treatment  Interventions: Functional mobility training;Therapeutic activities;Balance training;Neuromuscular re-education;Patient/family education;Other (comment) (pregait activity)   PT Goals Acute Rehab PT Goals PT Goal Formulation: With patient/family Time For Goal Achievement: 09/13/11 Potential to Achieve Goals: Fair Pt will go Supine/Side to Sit: with mod assist PT Goal: Supine/Side to Sit - Progress: Goal set today Pt will go Sit to Supine/Side: with mod assist PT Goal: Sit to Supine/Side - Progress: Goal set today Pt will go Sit to Stand: with mod assist PT Goal: Sit to Stand - Progress: Goal set today Pt will Transfer Bed to Chair/Chair to Bed: with mod assist PT Transfer Goal: Bed to Chair/Chair to Bed - Progress: Goal set today Additional Goals Additional Goal #1: pt will participate in pregait activity at EOB with mod A PT Goal: Additional Goal #1 - Progress: Goal set today  Visit Information  Last PT Received On: 08/30/11 Assistance Needed: +2    Subjective Data  Patient Stated Goal: pt not able to verbalize   Prior Functioning  Home Living Lives With: Spouse Available Help at Discharge: Family Type of Home: House Home Layout: One level Bathroom Shower/Tub: Tub/shower unit;Door [employer] Toilet: Standard Bathroom Accessibility: Yes How Accessible: Accessible via wheelchair;Accessible via walker Home Adaptive Equipment: None Prior Function Level of Independence: Independent Able to Take Stairs?: Yes Driving: Yes Vocation: On disability Communication Communication: Other (comment) (did not attempt to verbalize) Dominant Hand: Right    Cognition  Overall Cognitive Status: Impaired Area of Impairment: Following commands;Problem solving Arousal/Alertness: Awake/alert Orientation Level: Other (comment) (difficult to assess) Behavior During Session: Flat affect Following Commands: Follows one step commands inconsistently    Extremity/Trunk Assessment Right Lower  Extremity Assessment RLE ROM/Strength/Tone: Deficits RLE ROM/Strength/Tone Deficits: Trace spontaneous movement in gross  extension Left Lower Extremity Assessment LLE ROM/Strength/Tone: Deficits LLE ROM/Strength/Tone Deficits: gross extension 3/5, df/pf 3/5   Balance Balance Balance Assessed: Yes Static Sitting Balance Static Sitting - Balance Support: Left upper extremity supported;Feet supported Static Sitting - Level of Assistance: 3: Mod assist;2: Max assist Static Sitting - Comment/# of Minutes: generally need mod/max assist unless she propped on UE's then min assist for short period until she fatigued  End of Session PT - End of Session Activity Tolerance: Patient limited by fatigue Patient left: in bed;with nursing in room;Other (comment) (needed to be cleaned due to leaky flexiseal) Nurse Communication: Mobility status  GP     Lucus Lambertson, [name] 08/30/2011, 5:00 PM  08/30/2011  [place] Bing, PT [phone] (pager)

## 2011-08-30 NOTE — Progress Notes (Signed)
Name: KALEYAH LABRECK MRN: [id number] DOB: 1950/11/28    LOS: 14  Referring Provider:  Venetia Maxon (nsgy) Reason for Referral:  ICU management   PULMONARY / CRITICAL CARE MEDICINE  Brief Summary:   61 yo with HTN Admitted for diffuse subarachnoid hemorrhage and aspiration pneumonia with VDRF.  S/p coil  7x62mm Acom aneurysm  Interval history:  No significant interval changes  Events Since Admit: 8/13  Presented to [facility] with SAH, intubated, extubated to comfort but improved 8/15  Transferred to [facility] 8/16  IR, postop VDRF, IVC 8/18  Increased spontaneous movements on L; increased secretions; failed SBT 8/18 AM due to apnea; ongoing fever 8/22 TCDs better.  PSV cycle.  Still needs ETT  Vital Signs: Temp:  [97.4 F (36.3 C)-98.7 F (37.1 C)] 97.4 F (36.3 C) (08/27 0400) Pulse Rate:  [60-115] 79  (08/27 0914) Resp:  [18-33] 30  (08/27 0900) BP: (136-186)/(56-97) 149/60 mmHg (08/27 0914) SpO2:  [96 %-99 %] 98 % (08/27 0900) Weight:  [97.6 kg (215 lb 2.7 oz)] 97.6 kg (215 lb 2.7 oz) (08/27 0500)  Intake/Output Summary (Last 24 hours) at 08/30/11 0944 Last data filed at 08/30/11 0900  Gross per 24 hour  Intake   1500 ml  Output   1545 ml  Net    -45 ml    Physical Examination: Gen: slightly tachypneic, comfortable otherwise HEENT: IVC drain out,  c/d/i, no erythema PULM: decreased bases, otherwise clear CV: RRR, no mgr AB: bS+, soft, nontender Ext: warm, 2+edema Neuro: not following commands today, moves RUE > LUE, opens eyes to voice, tracks  Principal Problem:  *Subarachnoid hemorrhage Active Problems:  Encephalopathy  Aspiration pneumonia  Acute respiratory failure  HTN (hypertension)  Vasospasm  ASSESSMENT AND PLAN  PULMONARY No results found for this basename: PHART:5,PCO2:5,PCO2ART:5,PO2ART:5,HCO3:5,O2SAT:5 in the last 168 hours Ventilator Settings:   CXR:  8/26: perihilar and basilar ATX ETT:   8/13>>8/14, 8/16 >>>8/24  A:  Postop VDRF. Resolved but high risk  needing vent again P:   pulm toilet Appears to be protecting airway, although some snoring respirations when asleep. At high risk for aspiration  CARDIOVASCULAR No results found for this basename: TROPONINI:5,LATICACIDVEN:5, O2SATVEN:5,PROBNP:5 in the last 168 hours Lines:   8/16  L Fem Sheath>>>8/19 8/16  R PICC >>>  A:  HTN, off cardene drip.  CVP adequate BP at goal P:  Per Neurosurgery >> no plans to replace ventric at this time, following MS and clinical status Cont IV hydralazine need to run BP more toward 180-140 range Cont labetalol   RENAL  Lab 08/30/11 0510 08/29/11 0400 08/28/11 0515 08/27/11 0450 08/26/11 0405  NA 145 142 135 137 139  K 4.1 4.2 -- -- --  CL 110 107 98 100 101  CO2 24 25 27 29 30   BUN 27* 24* 16 13 16   CREATININE 0.74 0.85 0.71 0.60 0.73  CALCIUM 9.6 9.7 9.4 9.2 8.8  MG -- -- -- -- --  PHOS -- -- -- -- --   Foley:  8/13>>>  A: Hypokalemia improved.  Normal renal function.  P:   Trend BMP  GASTROINTESTINAL No results found for this basename: AST:5,ALT:5,ALKPHOS:5,BILITOT:5,PROT:5,ALBUMIN:5 in the last 168 hours A:  No acute issues P:   On TF, speech rx following but she is not ready for PO diet   HEMATOLOGIC  Lab 08/30/11 0510 08/29/11 0400 08/28/11 0515 08/27/11 0450 08/26/11 0405  HGB 10.2* 10.6* 10.6* 10.5* 9.7*  HCT 32.1* 33.2* 33.0* 32.5* 30.0*  PLT 343  337 308 250 217  INR -- -- -- -- --  APTT -- -- -- -- --   A:  No acute issue. P:  Trend CBC SCD  INFECTIOUS  Lab 08/30/11 0510 08/29/11 0400 08/28/11 0515 08/27/11 0450 08/26/11 0405  WBC 13.6* 14.3* 14.9* 14.8* 13.3*  PROCALCITON -- -- -- -- --   Cultures: 8/17 blood >>neg 8/17 urine >>neg 8/20 resp c/s>>neg 8/21 CDiff>>>neg  Antibiotics: Unasyn 8/13 >>>8/15 Cefazolin 8/15 >> 8/18 Vanc 8/18(asp pna) >>8/21 Zosyn 8/18 (asp pna)>>8/22  A: No active issues P:   No abx at this time   ENDOCRINE  Lab 08/30/11 0750 08/30/11 0325 08/30/11 0017 08/29/11 1953  08/29/11 1636  GLUCAP 217* 176* 149* 178* 152*    A:  Hyperglycemia. Better with Lantus and SSI  P:   SSI/CBG Off lantus when off TF, added back 8/26  NEUROLOGIC 8/16 IVC Drain>>>pulled out accidentally 8/25 early AM with CT head trip.  A:   Subarachnoid hemorrhage s/p aneurism coiling Acom aneurysm Acute encephalopathy, improved 8/18 but still not following commands Prognosis?   Has ischemic areas on R  internal capsule and peripherally R anterior cortex,   No vasospasm on current TCDs P:   Per Neurosurgery; clearly marginal MS, but has not worsened over last 24-48h. Continuing to follow Nimodipine as ordered, ? Duration, will refer to NSGY  BEST PRACTICE / DISPOSITION Level of Care:  ICU Primary Service:  Neurosurgery Consultants:  Neurology, PCCM, speech rx Code Status:  Full Diet:  TF resumed 8/25 DVT Px: SCD's GI Px:  Protonix Skin Integrity:  Intact Social / Family:    Levy Pupa, MD, PhD 08/30/2011, 9:44 AM [facility] [phone] or if no answer [phone]

## 2011-08-30 NOTE — Progress Notes (Signed)
Speech Language Pathology  Patient Details Name: [name] MRN: [id number] DOB: 1950/04/01 Today's Date: 08/30/2011 Time:  -    Plan was to reassess at bedside today for appropriateness for objective swallow assessment.  Pt.'s condition appears slightly decreased today.  Noted MD note of enlarged ventricles on CT, decreased verbal output.  Pt. Is not ready to assess for po's at this time. Plan:  Will plan to f/u next date.   Breck Coons [place].Ed [employer] [phone]  08/30/2011

## 2011-08-30 NOTE — Progress Notes (Signed)
As above.  Will observe in ICU for increased work of breathing.

## 2011-08-30 NOTE — Progress Notes (Signed)
Subjective: Patient reports (nonverbal this am) opens eyes to voice  Objective: Vital signs in last 24 hours: Temp:  [97.4 F (36.3 C)-98.7 F (37.1 C)] 97.4 F (36.3 C) (08/27 0400) Pulse Rate:  [60-115] 78  (08/27 0700) Resp:  [18-33] 33  (08/27 0700) BP: (136-186)/(61-97) 162/72 mmHg (08/27 0700) SpO2:  [96 %-99 %] 97 % (08/27 0700) Weight:  [97.6 kg (215 lb 2.7 oz)] 97.6 kg (215 lb 2.7 oz) (08/27 0500)  Intake/Output from previous day: 08/26 0701 - 08/27 0700 In: 1495 [I.V.:480; NG/GT:1015] Out: 1545 [Urine:1195; Stool:350] Intake/Output this shift:    Opens eyes to voice briefly. Does not speak this am. Stirs in bed, not following commands. Some effort with respirations. CT reviewed by Dr. Venetia Maxon this am: slightly enlarged ventricles.   Lab Results:  Basename 08/30/11 0510 08/29/11 0400  WBC 13.6* 14.3*  HGB 10.2* 10.6*  HCT 32.1* 33.2*  PLT 343 337   BMET  Basename 08/30/11 0510 08/29/11 0400  NA 145 142  K 4.1 4.2  CL 110 107  CO2 24 25  GLUCOSE 166* 175*  BUN 27* 24*  CREATININE 0.74 0.85  CALCIUM 9.6 9.7    Studies/Results: Ct Head Wo Contrast  08/28/2011  *RADIOLOGY REPORT*  Clinical Data: Follow-up subarachnoid hemorrhage.  CT HEAD WITHOUT CONTRAST  Technique:  Contiguous axial images were obtained from the base of the skull through the vertex without contrast.  Comparison: Multiple priors, most recent 08/19 1013  Findings: Ventricles remain well decompressed following right frontal ventriculostomy placement.  Evolving right ACA territory infarct with increasingly well demarcated hypodensity of the inferior and medial right frontal lobe.  No new subarachnoid hemorrhage.  Hounsfield artifact from the coil mass in the region of the left anterior communicating artery.  Mild edema surrounds the ventriculostomy tube.  Slight hypodensity left frontal white matter stable.  Mild dependent fluid in the sphenoid.  Calvarium otherwise intact.  IMPRESSION: The ventricles  remain well decompressed.  No new subarachnoid hemorrhage or significant worsening from priors.   Original Report Authenticated By: Elsie Stain, M.D.    Dg Chest Port 1 View  08/29/2011  *RADIOLOGY REPORT*  Clinical Data: Atelectasis, subarachnoid hemorrhage  PORTABLE CHEST - 1 VIEW  Comparison: Prior chest x-ray obtained yesterday, eight 02/28/2011  Findings: Unchanged position of enteric feeding tube.  The metallic weighted tip projects over the gastric fundus.  Right upper extremity PICC, also in unchanged position with the tip at the superior cavoatrial junction.  Low inspiratory volumes with bilateral perihilar and bibasilar atelectasis.  Negative for edema, or new focal airspace consolidation.  No pleural effusion. Unchanged mild cardiomegaly exaggerated by low lung volumes and portable technique.  IMPRESSION:  1.  Stable support apparatus as above 2.  Persistent low inspiratory volumes with mild perihilar and bibasilar atelectasis.   Original Report Authenticated By: Vilma Prader     Assessment/Plan:   LOS: 14 days  Monitoring LOC;per Dr. Venetia Maxon will discuss replacing ventric if decline observed.   [name] 08/30/2011, 7:32 AM

## 2011-08-30 NOTE — Progress Notes (Signed)
Clinical Social Worker received phone call from [facility] who is assisting with pt social security application requesting that this Clinical Social Worker fax pt medications which will assist with application. Clinical Social Worker attempted to meet with pt husband at bedside to obtain permission and begin discussion of discharge planning, but pt husband not currently at bedside as it is currently quiet time on unit. Clinical Social Worker to follow up.  Jacklynn Lewis, MSW, LCSWA  Clinical Social Work [phone]

## 2011-08-31 LAB — GLUCOSE, CAPILLARY
Glucose-Capillary: 158 mg/dL — ABNORMAL HIGH (ref 70–99)
Glucose-Capillary: 158 mg/dL — ABNORMAL HIGH (ref 70–99)
Glucose-Capillary: 139 mg/dL — ABNORMAL HIGH (ref 70–99)
Glucose-Capillary: 139 mg/dL — ABNORMAL HIGH (ref 70–99)

## 2011-08-31 LAB — BLOOD GAS, ARTERIAL
Acid-base deficit: 3 mmol/L — ABNORMAL HIGH (ref 0.0–2.0)
Drawn by: 222511
O2 Saturation: 96.6 %
Patient temperature: 98.6
pO2, Arterial: 92.6 mmHg (ref 80.0–100.0)

## 2011-08-31 NOTE — Progress Notes (Signed)
Subjective: Patient reports somnolent, arouses.  Objective: Vital signs in last 24 hours: Temp:  [98 F (36.7 C)-99.1 F (37.3 C)] 99.1 F (37.3 C) (08/28 0400) Pulse Rate:  [74-101] 86  (08/28 0600) Resp:  [23-33] 28  (08/28 0600) BP: (116-179)/(45-91) 144/68 mmHg (08/28 0600) SpO2:  [97 %-100 %] 100 % (08/28 0600) Weight:  [96.1 kg (211 lb 13.8 oz)] 96.1 kg (211 lb 13.8 oz) (08/28 0100)  Intake/Output from previous day: 08/27 0701 - 08/28 0700 In: 1450 [I.V.:460; NG/GT:990] Out: 1658 [Urine:1508; Stool:150] Intake/Output this shift:    Physical Exam: Will nod to questions and weakly state her name, but not following commands.  Lab Results:  Basename 08/30/11 0510 08/29/11 0400  WBC 13.6* 14.3*  HGB 10.2* 10.6*  HCT 32.1* 33.2*  PLT 343 337   BMET  Basename 08/30/11 0510 08/29/11 0400  NA 145 142  K 4.1 4.2  CL 110 107  CO2 24 25  GLUCOSE 166* 175*  BUN 27* 24*  CREATININE 0.74 0.85  CALCIUM 9.6 9.7    Studies/Results: Ct Head Wo Contrast  08/30/2011  *RADIOLOGY REPORT*  Clinical Data: 61 year old female status post ruptured anterior communicating artery aneurysm, subarachnoid hemorrhage, endovascular coil embolization.  CT HEAD WITHOUT CONTRAST  Technique:  Contiguous axial images were obtained from the base of the skull through the vertex without contrast.  Comparison: 08/28/2011 and earlier.  Findings: Minor paranasal sinus mucosal thickening.  Nasogastric tube on the left. Stable visualized osseous structures.  Right frontal bone burr hole.  External ventricular drain has been removed.  Scalp and orbits soft tissues within normal limits.  Streak artifact from coil pack at the level of the anterior communicating artery aneurysm.  And EVD removal, ventricular size has increased.  Temporal horns now are mildly dilated.  The fourth ventricle has a more stable size.  Basilar cisterns remain patent.  Hypodensity along the shunt tract on the right.  Right ACA subacute  infarct is stable.  No new cortically based infarct.  Stable small volume of hemorrhage along the interhemispheric fissure and in the left sylvian fissure.  No new intracranial hemorrhage identified.  IMPRESSION: 1.  Mild ventricular enlargement following EVD removal.  Recommend continued follow up. 2.  Otherwise stable brain status post Acomm aneurysm coiling, right ACA infarct, intracranial hemorrhage. Small volume of residual blood products along the interhemispheric fissure and in the left sylvian fissure.   Original Report Authenticated By: Harley Hallmark, M.D.     Assessment/Plan: Will assess for increased ventriculomegaly and shunt if further increase.  Still increased work of breathing.  [place]serve in ICU.    LOS: 15 days    Tanganika Barradas D, MD 08/31/2011, 7:01 AM

## 2011-08-31 NOTE — Progress Notes (Signed)
Name: [name] MRN: [id number] DOB: 30-Nov-1950    LOS: 15  Referring Provider:  Venetia Maxon (nsgy) Reason for Referral:  ICU management   PULMONARY / CRITICAL CARE MEDICINE  Brief Summary:   61 yo with HTN Admitted for diffuse subarachnoid hemorrhage and aspiration pneumonia with VDRF.  S/p coil  7x75mm Acom aneurysm  Interval history:  No significant interval changes, remains poorly responsive, tachypneic  Events Since Admit: 8/13  Presented to [facility] with SAH, intubated, extubated to comfort but improved 8/15  Transferred to [facility] 8/16  IR, postop VDRF, IVC 8/18  Increased spontaneous movements on L; increased secretions; failed SBT 8/18 AM due to apnea; ongoing fever 8/22 TCDs better.    Vital Signs: Temp:  [98 F (36.7 C)-99.1 F (37.3 C)] 98 F (36.7 C) (08/28 0800) Pulse Rate:  [74-101] 83  (08/28 0900) Resp:  [23-33] 28  (08/28 0900) BP: (116-179)/(45-91) 133/69 mmHg (08/28 0900) SpO2:  [97 %-100 %] 97 % (08/28 0900) Weight:  [96.1 kg (211 lb 13.8 oz)] 96.1 kg (211 lb 13.8 oz) (08/28 0100)  Intake/Output Summary (Last 24 hours) at 08/31/11 0908 Last data filed at 08/31/11 0900  Gross per 24 hour  Intake   1560 ml  Output   1908 ml  Net   -348 ml    Physical Examination: Gen: tachypneic obese woman, comfortable otherwise HEENT: IVC drain out,  c/d/i, no erythema PULM: decreased bases, otherwise clear CV: RRR, no mgr AB: bS+, soft, nontender Ext: warm, 2+edema Neuro: not following commands today, moves RUE > LUE, opens eyes to voice, tracks  Principal Problem:  *Subarachnoid hemorrhage Active Problems:  Encephalopathy  Aspiration pneumonia  Acute respiratory failure  HTN (hypertension)  Vasospasm  ASSESSMENT AND PLAN  PULMONARY No results found for this basename: PHART:5,PCO2:5,PCO2ART:5,PO2ART:5,HCO3:5,O2SAT:5 in the last 168 hours Ventilator Settings:   CXR:  8/26: perihilar and basilar ATX ETT:   8/13>>8/14, 8/16 >>>8/24  A:  Postop VDRF. Resolved  but high risk needing vent again P:   pulm toilet Appears to be protecting airway, although some snoring respirations when asleep. At high risk for aspiration Repeat CXR 8/29  CARDIOVASCULAR No results found for this basename: TROPONINI:5,LATICACIDVEN:5, O2SATVEN:5,PROBNP:5 in the last 168 hours Lines:   8/16  L Fem Sheath>>>8/19 8/16  R PICC >>>  A:  HTN, off cardene drip. BP at goal P:  Cont IV hydralazine need to run BP more toward 180-140 range Cont labetalol   RENAL  Lab 08/30/11 0510 08/29/11 0400 08/28/11 0515 08/27/11 0450 08/26/11 0405  NA 145 142 135 137 139  K 4.1 4.2 -- -- --  CL 110 107 98 100 101  CO2 24 25 27 29 30   BUN 27* 24* 16 13 16   CREATININE 0.74 0.85 0.71 0.60 0.73  CALCIUM 9.6 9.7 9.4 9.2 8.8  MG -- -- -- -- --  PHOS -- -- -- -- --   Foley:  8/13>>>  A: Hypokalemia improved.  Normal renal function.  P:   Trend BMP  GASTROINTESTINAL No results found for this basename: AST:5,ALT:5,ALKPHOS:5,BILITOT:5,PROT:5,ALBUMIN:5 in the last 168 hours A:  No acute issues P:   On TF, speech rx following but she is not ready for PO diet   HEMATOLOGIC  Lab 08/30/11 0510 08/29/11 0400 08/28/11 0515 08/27/11 0450 08/26/11 0405  HGB 10.2* 10.6* 10.6* 10.5* 9.7*  HCT 32.1* 33.2* 33.0* 32.5* 30.0*  PLT 343 337 308 250 217  INR -- -- -- -- --  APTT -- -- -- -- --  A:  No acute issue. P:  Trend CBC SCD  INFECTIOUS  Lab 08/30/11 0510 08/29/11 0400 08/28/11 0515 08/27/11 0450 08/26/11 0405  WBC 13.6* 14.3* 14.9* 14.8* 13.3*  PROCALCITON -- -- -- -- --   Cultures: 8/17 blood >>neg 8/17 urine >>neg 8/20 resp c/s>>neg 8/21 CDiff>>>neg  Antibiotics: Unasyn 8/13 >>>8/15 Cefazolin 8/15 >> 8/18 Vanc 8/18(asp pna) >>8/21 Zosyn 8/18 (asp pna)>>8/22  A: No active issues P:   No abx at this time   ENDOCRINE  Lab 08/31/11 0801 08/31/11 0342 08/30/11 2307 08/30/11 1941 08/30/11 1548  GLUCAP 158* 158* 144* 170* 160*    A:  Hyperglycemia. Better  with Lantus and SSI  P:   SSI/CBG Off lantus when off TF, added back 8/26  NEUROLOGIC 8/16 IVC Drain>>>pulled out accidentally 8/25 early AM with CT head trip. Head CT 8/28 >> 1. Mild ventricular enlargement following EVD removal. Recommend  continued follow up.  2. Otherwise stable brain status post Acomm aneurysm coiling,  right ACA infarct, intracranial hemorrhage. Small volume of  residual blood products along the interhemispheric fissure and in  the left sylvian fissure  A:   Subarachnoid hemorrhage s/p aneurism coiling Acom aneurysm Acute encephalopathy, improved 8/18 but still not following commands Prognosis?   Has ischemic areas on R  internal capsule and peripherally R anterior cortex,   No vasospasm on most recent TCDs. CT 8/27 with slight increase ventricular size after drain pulled P:   Per Neurosurgery; clearly marginal MS, but has not worsened over last 24-48h. Continuing to follow MS and CT scan (last CT 8/27 with enlarged ventricles post drain removal). Repeat CT ordered for 8/29 Nimodipine as ordered, ? Duration, will refer to NSGY  BEST PRACTICE / DISPOSITION Level of Care:  ICU Primary Service:  Neurosurgery Consultants:  Neurology, PCCM, speech rx Code Status:  Full Diet:  TF resumed 8/25 DVT Px: SCD's GI Px:  Protonix Skin Integrity:  Intact Social / Family:    Levy Pupa, MD, PhD 08/31/2011, 9:08 AM [facility] [phone] or if no answer [phone]

## 2011-08-31 NOTE — Progress Notes (Signed)
Speech Language Pathology Dysphagia Treatment Patient Details Name: [name] MRN: [id number] DOB: [birth year] Today's Date: 08/31/2011 Time: 8657-8469 SLP Time Calculation (min): 15 min  Assessment / Plan / Recommendation Clinical Impression  Treatment focused on pt.'s ability to orally manipulate bolus and initiate swallow as well as increased cognition and ability to safely consume food/liquid.  Pt. more lethargic today (compared to Monday).  Severe oral impairments with little manipulation with applesauce.  SLP unable to detect a pharyngeal swallow visually or with manuel palpation.  Delayed strong cough possibly due to posterior spill of puree.  Pt. is not appropriate for objective assessment at this time and should remain NPO.  Will continue to follow 1-2 more times.    Diet Recommendation  Continue with Current Diet: NPO    SLP Plan Continue with current plan of care   Pertinent Vitals/Pain    Swallowing Goals  SLP Swallowing Goals Goal #3: Patient will consume PO trials of various consistencies administered by SLP only with no observed s/s of aspiration with mod verbal cues.   Swallow Study Goal #3 - Progress: Progressing toward goal  General Respiratory Status: Supplemental O2 delivered via (comment) Behavior/Cognition: Lethargic;Requires cueing;Doesn't follow directions;Confused;Decreased sustained attention Oral Cavity - Dentition: Edentulous Patient Positioning: Upright in bed  Oral Cavity - Oral Hygiene Does patient have any of the following "at risk" factors?: None of the above Brush patient's teeth BID with toothbrush (using toothpaste with fluoride): Yes Patient is HIGH RISK - Oral Care Protocol followed (see row info): Yes   Dysphagia Treatment Treatment focused on: Skilled observation of diet tolerance;Patient/family/caregiver education Treatment Methods/Modalities: Skilled observation Patient observed directly with PO's: Yes Type of PO's observed:  Dysphagia 1 (puree) Feeding: Total assist Liquids provided via: Teaspoon Oral Phase Signs & Symptoms: Prolonged bolus formation;Prolonged oral phase Pharyngeal Phase Signs & Symptoms: Delayed cough Type of cueing: Verbal;Tactile;Visual Amount of cueing: Total   GO     Breck Coons [place].Ed [employer] [phone]  08/31/2011

## 2011-08-31 NOTE — Progress Notes (Signed)
Clinical Social Worker visited pt room and pt son present at this time. Clinical Social Worker introduced self and explained role. Clinical Social Worker provided support to pt son as he discussed pt lengthy hospital stay. Pt son discussed that pt husband would likely return tomorrow and confirmed that this Clinical Social Worker would be able to contact him via telephone to set up a time to meet on 8/29.   Clinical Social Worker contacted pt husband, [name] via telephone. Clinical Social Worker arranged meeting with pt husband for 8/29 at 11am to begin discussion of discharge planning process as pt has been evaluated for PT and recommendation is for SNF. Pt husband confirmed that he has been working with financial counseling in regard to [employer] and Social Security Disability. Clinical Social Worker discussed that the [employer] who is assisting with social security disability application requested pt medication and received permission from pt husband to send pt medications. Clinical Social Worker to meet with pt husband 8/29 at 11am. Clinical Social Worker to continue to follow.  Jacklynn Lewis, MSW, LCSWA  Clinical Social Work [phone]

## 2011-08-31 NOTE — Progress Notes (Signed)
Called Dr. Franky Macho for clarification on pt's NPO status after midnight and was told to hold medications as well.

## 2011-09-01 ENCOUNTER — Inpatient Hospital Stay (HOSPITAL_COMMUNITY): Payer: Medicaid Other

## 2011-09-01 DIAGNOSIS — I609 Nontraumatic subarachnoid hemorrhage, unspecified: Secondary | ICD-10-CM

## 2011-09-01 LAB — GLUCOSE, CAPILLARY: Glucose-Capillary: 118 mg/dL — ABNORMAL HIGH (ref 70–99)

## 2011-09-01 NOTE — Progress Notes (Signed)
Speech Language Pathology Discharge Patient Details Name: [name] MRN: [id number] DOB: [birth year] Today's Date: 09/01/2011 Time:  -     Patient discharged from SLP services secondary to patient has made no progress toward goals in a reasonable time frame.  Please see latest therapy progress note for current level of functioning and progress toward goals.    Progress and discharge plan discussed with patient and/or caregiver: Patient/Caregiver disagrees with plan       Royce Macadamia 09/01/2011, 8:53 AM

## 2011-09-01 NOTE — Progress Notes (Signed)
Nutrition Follow-up/Consult  Intervention:   1. Continue Jevity 1.2 @ 45 ml/hr via NGT with 30 ml Prostat 4 times daily. At goal rate, tube feeding regimen will provide 1696 kcal, 120 grams of protein, and 872 ml of H2O.  2. If no IV fluids, pt will need additional free water fluids to maintain hydration (210 ml H2O QID to meet minimum needs)    Assessment:   Patient extubated 8/24. BSE completed 8/24 recommending NPO secondary to s/s present during evaluation with patient's decreased ability to protect airway with PO's. RD consulted for initiation/management of enteral nutrition via Adult Enteral Nutrition Protocol.  RN reports pt is currently tolerating TF via NGT well at this time. Pt discussed during ICU rounds this am. Per rounds pt to have stent placed tomorrow.  Per RN SLP has signed off as pt has had no improvement in swallow function. Question if pt will need PEG.  Diet Order:  NPO TF: Jevity 1.2 at 45 ml/hr with 30 ml Prostat QID- provides 1696 kcal, 120 grams protein, 872 ml free water  Meds: Scheduled Meds:    . antiseptic oral rinse  15 mL Mouth Rinse q12n4p  . chlorhexidine  15 mL Mouth Rinse BID  . feeding supplement  30 mL Per Tube QID  . hydrALAZINE  20 mg Intravenous Q8H  . insulin aspart  0-15 Units Subcutaneous Q4H  . insulin glargine  10 Units Subcutaneous QHS  . labetalol  200 mg Per Tube TID  . levETIRAcetam  500 mg Per Tube BID  . NiMODipine  60 mg Per Tube Q4H  . pantoprazole sodium  40 mg Per Tube Q1200  . potassium chloride  40 mEq Per Tube BID   Continuous Infusions:    . sodium chloride 20 mL/hr at 09/01/11 1200  . feeding supplement (JEVITY 1.2 CAL) 1,000 mL (09/01/11 1022)   PRN Meds:.albuterol, fentaNYL, HYDROcodone-acetaminophen, ipratropium, labetalol, ondansetron (ZOFRAN) IV, oxymetazoline  Labs:  CMP     Component Value Date/Time   NA 145 08/30/2011 0510   K 4.1 08/30/2011 0510   CL 110 08/30/2011 0510   CO2 24 08/30/2011 0510   GLUCOSE  166* 08/30/2011 0510   BUN 27* 08/30/2011 0510   CREATININE 0.74 08/30/2011 0510   CALCIUM 9.6 08/30/2011 0510   PROT 7.9 08/18/2011 1336   ALBUMIN 3.0* 08/18/2011 1336   AST 28 08/18/2011 1336   ALT 16 08/18/2011 1336   ALKPHOS 75 08/18/2011 1336   BILITOT 0.6 08/18/2011 1336   GFRNONAA 89* 08/30/2011 0510   GFRAA >90 08/30/2011 0510   Sodium  Date/Time Value Range Status  08/30/2011  5:10 AM 145  135 - 145 mEq/L Final  08/29/2011  4:00 AM 142  135 - 145 mEq/L Final  08/28/2011  5:15 AM 135  135 - 145 mEq/L Final    Potassium  Date/Time Value Range Status  08/30/2011  5:10 AM 4.1  3.5 - 5.1 mEq/L Final  08/29/2011  4:00 AM 4.2  3.5 - 5.1 mEq/L Final  08/28/2011  5:15 AM 3.9  3.5 - 5.1 mEq/L Final    No results found for this basename: phos    No results found for this basename: mg   CBG (last 3)   Basename 09/01/11 1157 09/01/11 0813 09/01/11 0420  GLUCAP 159* 127* 118*     Intake/Output Summary (Last 24 hours) at 09/01/11 1235 Last data filed at 09/01/11 1200  Gross per 24 hour  Intake   1100 ml  Output   2023  ml  Net   -923 ml  150 ml stool output via rectal pouch  Ht: 5'9"  Weight Status:  224 lbs on admission - trending down 209 lbs 8/29 211 lbs 8/28 215 lbs 8/27   Body mass index is 30.93 kg/(m^2). Obese Class I.  Re-estimated needs:  1600 - 1800 kcal, 115-130 gm protein   Nutrition Dx:  Inadequate oral intake r/t inability to eat AEB NPO status. Ongoing.  Goal:  Enteral nutrition to provide at least 90% of estimated energy needs. Unmet.  Monitor:  TF initiation/tolerance, weights, labs, I/O's, vent settings  Kendell Bane RD, LDN, CNSC [phone] Pager [phone] After Hours Pager

## 2011-09-01 NOTE — Progress Notes (Signed)
Speech Language Pathology  Patient Details Name: [name] MRN: [id number] DOB: Nov 25, 1950 Today's Date: 09/01/2011 Time:  -    Reviewed notes from yesterday.  Neurosurgery is considering shunt placement and pt. made NPO yesterday.  Pt. Has been followed each day since 8/24 without progress toward objective assessment for safe po consumption. Plan:  ST will sign off at this time.  Please reconsult if status improves and food/liquid is considered.  Breck Coons [place].Ed [employer] [phone]  09/01/2011

## 2011-09-01 NOTE — Progress Notes (Signed)
I met with patient's husband and reviewed CT results with progressive ventriculomegaly.  Patient will need shunt.  Will proceed with laparoscopically assisted VP shunt placement with Dr. Abbey Chatters on 09/02/2011.

## 2011-09-01 NOTE — Progress Notes (Signed)
Clinical Child psychotherapist met with pt husband, pt sister, and pt niece at bedside to provide support and begin discussion of discharge planning. Clinical Social Worker introduced self and explained role. Clinical Social Worker discussed with pt family process of SNF placement. Clinical Social Worker discussed that pt may have limited options for SNF placement given pt currently not having insurance. Clinical Social Worker provided positive reinforcement to pt husband for meeting with financial counseling early in pt admission to complete Medicaid and social security disability applications. Pt husband and family agreeable to SNF placement and expressed understanding that SNF options may be limited initially until pt is approved for Medicaid. Per chart, there are currently a number of barriers to SNF placement including pt feeding and plan for re-placement of ventriculoperitoneal shunt and this Clinical Social Worker will initiate SNF search once barriers addressed. Clinical Social Worker provided pt husband list of SNF in multiple counties. Clinical Social Worker to continue to follow and assist with discharge planning process.   Jacklynn Lewis, MSW, LCSWA  Clinical Social Work [phone]

## 2011-09-01 NOTE — Progress Notes (Signed)
Name: RAAHI KORBER MRN: [id number] DOB: 01-Jul-1950    LOS: 16  Referring Provider:  Venetia Maxon (nsgy) Reason for Referral:  ICU management   PULMONARY / CRITICAL CARE MEDICINE  Brief Summary:   61 yo with HTN Admitted for diffuse subarachnoid hemorrhage and aspiration pneumonia with VDRF.  S/p coil  7x52mm Acom aneurysm  Interval history:  No significant interval changes, remains poorly responsive, tachypneic  Events Since Admit: 8/13  Presented to [facility] with SAH, intubated, extubated to comfort but improved 8/15  Transferred to [facility] 8/16  IR, postop VDRF, IVC 8/18  Increased spontaneous movements on L; increased secretions; failed SBT 8/18 AM due to apnea; ongoing fever 8/22 TCDs better.   8/29  CT Head >> ventricles continued to enlarge slightly  Vital Signs: Temp:  [97.5 F (36.4 C)-99.3 F (37.4 C)] 97.9 F (36.6 C) (08/29 0700) Pulse Rate:  [69-93] 85  (08/29 0900) Resp:  [22-36] 25  (08/29 0900) BP: (102-165)/(48-88) 129/63 mmHg (08/29 0900) SpO2:  [97 %-99 %] 97 % (08/29 0900) Weight:  [95 kg (209 lb 7 oz)] 95 kg (209 lb 7 oz) (08/29 0400)  Intake/Output Summary (Last 24 hours) at 09/01/11 1035 Last data filed at 09/01/11 0900  Gross per 24 hour  Intake   1085 ml  Output   1638 ml  Net   -553 ml    Physical Examination: Gen: tachypneic obese woman, comfortable otherwise HEENT: IVC drain out,  c/d/i, no erythema PULM: decreased bases, otherwise clear CV: RRR, no mgr AB: bS+, soft, nontender Ext: warm, 2+edema Neuro: not following commands today, moves RUE > LUE, opens eyes to voice, tracks  Principal Problem:  *Subarachnoid hemorrhage Active Problems:  Encephalopathy  Aspiration pneumonia  Acute respiratory failure  HTN (hypertension)  Vasospasm  ASSESSMENT AND PLAN  PULMONARY  Lab 08/31/11 0952  PHART 7.422  PCO2ART 32.4*  PO2ART 92.6  HCO3 20.8  O2SAT 96.6   Ventilator Settings:   CXR:  8/29: perihilar and basilar ATX, stable from 8/26, no new  infiltrates ETT:   8/13>>8/14, 8/16 >>>8/24  A:  Postop VDRF. Resolved but high risk needing vent again P:   pulm toilet Appears to be protecting airway, although some snoring respirations when asleep. At high risk for aspiration  CARDIOVASCULAR No results found for this basename: TROPONINI:5,LATICACIDVEN:5, O2SATVEN:5,PROBNP:5 in the last 168 hours Lines:   8/16  L Fem Sheath>>>8/19 8/16  R PICC >>>  A:  HTN, off cardene drip. BP at goal P:  Cont IV hydralazine need to run BP more toward 180-140 range Cont labetalol   RENAL  Lab 08/30/11 0510 08/29/11 0400 08/28/11 0515 08/27/11 0450 08/26/11 0405  NA 145 142 135 137 139  K 4.1 4.2 -- -- --  CL 110 107 98 100 101  CO2 24 25 27 29 30   BUN 27* 24* 16 13 16   CREATININE 0.74 0.85 0.71 0.60 0.73  CALCIUM 9.6 9.7 9.4 9.2 8.8  MG -- -- -- -- --  PHOS -- -- -- -- --   Foley:  8/13>>>  A: Hypokalemia improved.  Normal renal function.  P:   Trend BMP  GASTROINTESTINAL No results found for this basename: AST:5,ALT:5,ALKPHOS:5,BILITOT:5,PROT:5,ALBUMIN:5 in the last 168 hours A:  No acute issues P:   On TF, speech rx signed off >>  she is not ready for PO diet   HEMATOLOGIC  Lab 08/30/11 0510 08/29/11 0400 08/28/11 0515 08/27/11 0450 08/26/11 0405  HGB 10.2* 10.6* 10.6* 10.5* 9.7*  HCT 32.1*  33.2* 33.0* 32.5* 30.0*  PLT 343 337 308 250 217  INR -- -- -- -- --  APTT -- -- -- -- --   A:  No acute issue. P:  Trend CBC SCD  INFECTIOUS  Lab 08/30/11 0510 08/29/11 0400 08/28/11 0515 08/27/11 0450 08/26/11 0405  WBC 13.6* 14.3* 14.9* 14.8* 13.3*  PROCALCITON -- -- -- -- --   Cultures: 8/17 blood >>neg 8/17 urine >>neg 8/20 resp c/s>>neg 8/21 CDiff>>>neg  Antibiotics: Unasyn 8/13 >>>8/15 Cefazolin 8/15 >> 8/18 Vanc 8/18(asp pna) >>8/21 Zosyn 8/18 (asp pna)>>8/22  A: No active issues P:   No abx at this time   ENDOCRINE  Lab 09/01/11 0813 09/01/11 0420 09/01/11 0006 08/31/11 1944 08/31/11 1609  GLUCAP  127* 118* 171* 139* 139*    A:  Hyperglycemia. Better with Lantus and SSI  P:   SSI/CBG Off lantus when off TF, added back 8/26  NEUROLOGIC 8/16 IVC Drain>>>pulled out accidentally 8/25 early AM with CT head trip. Head CT 8/29>> IMPRESSION:  Ventricle size is slightly larger (from 8/27). No acute hemorrhage.  Low density areas of infarct in the right frontal lobe are stable.  Hypodensities in the left anterior corpus callosum and left putamen  compatible with evolving subacute infarct A:   Subarachnoid hemorrhage s/p aneurism coiling Acom aneurysm Acute encephalopathy, improved but still not following commands, now plateuaed Has ischemic areas on R  internal capsule and peripherally R anterior cortex,   No vasospasm on most recent TCDs. CT 8/27 and 28 with slight increase ventricular size after drain pulled P:   Continued slow enlargement of the ventricles. Dr Venetia Maxon following. Her MS has been stable. Probable VP shunt or repeat drain placement on 8/30 Nimodipine as ordered, ? Duration, will defer to NSGY  BEST PRACTICE / DISPOSITION Level of Care:  ICU Primary Service:  Neurosurgery Consultants:  Neurology, PCCM, speech rx Code Status:  Full Diet:  TF resumed 8/25, has not passed swallow eval DVT Px: SCD's GI Px:  Protonix Skin Integrity:  Intact Social / Family:    Levy Pupa, MD, PhD 09/01/2011, 10:35 AM [facility] [phone] or if no answer [phone]

## 2011-09-01 NOTE — Consult Note (Signed)
Reason for Consult:  Ventriculomegaly  Referring Physician: Dr. Lezlie Octave is an 61 y.o. female.  HPI: She had a subarachnoid hemorrhage earlier this month and had coiling of an intracranial aneurysm.  She has been making a slow recovery but has a decline in her mental status the past two days.  A CT of the head shows increased ventricular size.  A ventriculoperitoneal shunt is planned by Dr. Venetia Maxon and I have been asked to see her and assist with the intraperitoneal placement of the shunt.  Her husband is at the bedside.  Past Medical History  Diagnosis Date  . Hypertension     Subarachnoid hemorrhage   Aspiration pneumonia  Past Surgical History  Procedure Date  . Abdominal hysterectomy     History reviewed. No pertinent family history.  Social History:  reports that she has never smoked. She does not have any smokeless tobacco history on file. She reports that she does not drink alcohol or use illicit drugs.  Allergies:  Allergies  Allergen Reactions  . Lactose Intolerance (Gi) Other (See Comments)    G.I. Upset    Medications: I have reviewed the patient's current medications.  Results for orders placed during the hospital encounter of 08/16/11 (from the past 48 hour(s))  GLUCOSE, CAPILLARY     Status: Abnormal   Collection Time   08/30/11  3:48 PM      Component Value Range Comment   Glucose-Capillary 160 (*) 70 - 99 mg/dL    Comment 1 Notify RN      Comment 2 Documented in Chart     GLUCOSE, CAPILLARY     Status: Abnormal   Collection Time   08/30/11  7:41 PM      Component Value Range Comment   Glucose-Capillary 170 (*) 70 - 99 mg/dL    Comment 1 Notify RN      Comment 2 Documented in Chart     GLUCOSE, CAPILLARY     Status: Abnormal   Collection Time   08/30/11 11:07 PM      Component Value Range Comment   Glucose-Capillary 144 (*) 70 - 99 mg/dL   GLUCOSE, CAPILLARY     Status: Abnormal   Collection Time   08/31/11  3:42 AM      Component Value  Range Comment   Glucose-Capillary 158 (*) 70 - 99 mg/dL   GLUCOSE, CAPILLARY     Status: Abnormal   Collection Time   08/31/11  8:01 AM      Component Value Range Comment   Glucose-Capillary 158 (*) 70 - 99 mg/dL   BLOOD GAS, ARTERIAL     Status: Abnormal   Collection Time   08/31/11  9:52 AM      Component Value Range Comment   FIO2 0.21      pH, Arterial 7.422  7.350 - 7.450    pCO2 arterial 32.4 (*) 35.0 - 45.0 mmHg    pO2, Arterial 92.6  80.0 - 100.0 mmHg    Bicarbonate 20.8  20.0 - 24.0 mEq/L    TCO2 21.8  0 - 100 mmol/L    Acid-base deficit 3.0 (*) 0.0 - 2.0 mmol/L    O2 Saturation 96.6      Patient temperature 98.6      Collection site LEFT RADIAL      Drawn by [id number]      Sample type ARTERIAL DRAW      Allens test (pass/fail) PASS  PASS   GLUCOSE, CAPILLARY  Status: Abnormal   Collection Time   08/31/11 12:20 PM      Component Value Range Comment   Glucose-Capillary 189 (*) 70 - 99 mg/dL   GLUCOSE, CAPILLARY     Status: Abnormal   Collection Time   08/31/11  4:09 PM      Component Value Range Comment   Glucose-Capillary 139 (*) 70 - 99 mg/dL    Comment 1 Notify RN      Comment 2 Documented in Chart     GLUCOSE, CAPILLARY     Status: Abnormal   Collection Time   08/31/11  7:44 PM      Component Value Range Comment   Glucose-Capillary 139 (*) 70 - 99 mg/dL    Comment 1 Notify RN      Comment 2 Documented in Chart     GLUCOSE, CAPILLARY     Status: Abnormal   Collection Time   09/01/11 12:06 AM      Component Value Range Comment   Glucose-Capillary 171 (*) 70 - 99 mg/dL   GLUCOSE, CAPILLARY     Status: Abnormal   Collection Time   09/01/11  4:20 AM      Component Value Range Comment   Glucose-Capillary 118 (*) 70 - 99 mg/dL   GLUCOSE, CAPILLARY     Status: Abnormal   Collection Time   09/01/11  8:13 AM      Component Value Range Comment   Glucose-Capillary 127 (*) 70 - 99 mg/dL   GLUCOSE, CAPILLARY     Status: Abnormal   Collection Time   09/01/11 11:57 AM        Component Value Range Comment   Glucose-Capillary 159 (*) 70 - 99 mg/dL     Ct Head Wo Contrast  09/01/2011  *RADIOLOGY REPORT*  Clinical Data: Post aneurysm coiling and subarachnoid hemorrhage. Evaluate ventricles.  CT HEAD WITHOUT CONTRAST  Technique:  Contiguous axial images were obtained from the base of the skull through the vertex without contrast.  Comparison: 08/30/2011  Findings: Ventricles are slightly larger on today's study.  Aneurysm coils in the anterior communicating artery region.  No acute or recurrent hemorrhage.  Hypodensity right  inferior frontal lobe is unchanged and may represent infarction.  Hypodensity in the mid right frontal lobe is unchanged and  may be infarct or injury related to ventricular catheter placement which has been removed.  Small hypodensity left anterior corpus callosum appears more prominent and may represent developing acute infarct.  Hypodensity left putamen may also represent subacute infarction.  IMPRESSION: Ventricle size is slightly larger.  No acute hemorrhage.  Low density areas of infarct in the right frontal lobe are stable.  Hypodensities in the left anterior corpus callosum and left putamen compatible with evolving subacute infarct.   Original Report Authenticated By: Camelia Phenes, M.D.    Dg Chest Port 1 View  09/01/2011  *RADIOLOGY REPORT*  Clinical Data: Atelectasis, infiltrates  PORTABLE CHEST - 1 VIEW  Comparison: 08/29/2011  Findings: Low lung volumes.  Mild right perihilar/upper lobe opacity is unchanged, possibly atelectasis.  No pleural effusion or pneumothorax.  The heart is normal in size.  Stable right arm PICC and enteric tube.  IMPRESSION: Low lung volumes with stable mild right perihilar/upper lobe opacity, possibly atelectasis.   Original Report Authenticated By: Charline Bills, M.D.     Review of Systems  Unable to perform ROS: mental status change   Blood pressure 136/78, pulse 80, temperature 98.6 F (37 C),  temperature source  Axillary, resp. rate 26, height 5\' 9"  (1.753 m), weight 209 lb 7 oz (95 kg), SpO2 97.00%. Physical Exam  Constitutional:       Overweight female.    HENT:       Feeding tube in nare.  Respiratory:       Slightly increased respiratory effort.  Breath sounds are clear anteriorly  GI: Soft. She exhibits no distension and no mass.       Lower midline scar.  Neurological:       Awake.  Does not follow commands.    Assessment/Plan: Ventriculomegaly s/p SAH   Plan:  Laparoscopic assisted ventriculoperitoneal shunt placement.  I have discussed the procedure and risks with her and her husband.  The risks include but are not limited bleeding, infection, wound problems, anesthesia, injury to intraabdominal organs.  Mr. Dinkel seems to understand and agrees with the plan.  [name] 09/01/2011, 12:17 PM

## 2011-09-01 NOTE — Progress Notes (Signed)
Subjective: Patient reports (Aphasic. Opens eyes to voice)  Objective: Vital signs in last 24 hours: Temp:  [97.5 F (36.4 C)-99.3 F (37.4 C)] 99 F (37.2 C) (08/29 0400) Pulse Rate:  [69-93] 79  (08/29 0700) Resp:  [26-36] 29  (08/29 0700) BP: (102-165)/(48-88) 157/78 mmHg (08/29 0700) SpO2:  [97 %-99 %] 98 % (08/29 0700) Weight:  [95 kg (209 lb 7 oz)] 95 kg (209 lb 7 oz) (08/29 0400)  Intake/Output from previous day: 08/28 0701 - 08/29 0700 In: 1190 [I.V.:470; NG/GT:720] Out: 1538 [Urine:1438; Stool:100] Intake/Output this shift:    More awake today, opening eyes to voice. ?f/c. CT this am reviewed by Dr. Venetia Maxon - enlarged ventricles.  Lab Results:  [facility] 08/30/11 0510  WBC 13.6*  HGB 10.2*  HCT 32.1*  PLT 343   BMET  Basename 08/30/11 0510  NA 145  K 4.1  CL 110  CO2 24  GLUCOSE 166*  BUN 27*  CREATININE 0.74  CALCIUM 9.6    Studies/Results: No results found.  Assessment/Plan:   LOS: 16 days  Dr. Venetia Maxon considering vp shunt placement. Will keep NPO for now.   Georgiann Cocker 09/01/2011, 7:40 AM

## 2011-09-01 NOTE — Progress Notes (Signed)
Physical Therapy Treatment Patient Details Name: [name] MRN: [id number] DOB: [birth year] Today's Date: 09/01/2011 Time: 7425-9563 PT Time Calculation (min): 30 min  PT Assessment / Plan / Recommendation Comments on Treatment Session  pt's mobility status generally the same as on eval.  Able to stand at bedside to assess ability and to do more pericare/change pad.  Pt quick to fatigue and follows few VC's    Follow Up Recommendations  Skilled nursing facility    Barriers to Discharge        Equipment Recommendations  Defer to next venue    Recommendations for Other Services    Frequency Min 3X/week   Plan Discharge plan remains appropriate    Precautions / Restrictions Precautions Precautions: Fall Restrictions Weight Bearing Restrictions: No   Pertinent Vitals/Pain     Mobility  Bed Mobility Bed Mobility: Rolling Left;Left Sidelying to Sit;Sit to Supine;Sitting - Scoot to Edge of Bed Rolling Left: 1: +2 Total assist Rolling Left: Patient Percentage: 40% Left Sidelying to Sit: 1: +2 Total assist Left Sidelying to Sit: Patient Percentage: 30% Sitting - Scoot to Edge of Bed: 1: +2 Total assist Sitting - Scoot to Edge of Bed: Patient Percentage: 20% Sit to Supine: 1: +2 Total assist Sit to Supine: Patient Percentage: 10% Details for Bed Mobility Assistance: v/tc's for technique and initiation; truncal assist to come up Transfers Transfers: Sit to Stand;Stand to Sit Sit to Stand: 1: +1 Total assist;From bed Stand to Sit: 1: +1 Total assist;To bed (times 2) Details for Transfer Assistance: significant lifting and forward w/shift assist Ambulation/Gait Ambulation/Gait Assistance: Not tested (comment) Stairs: No Wheelchair Mobility Wheelchair Mobility: No Modified Rankin (Stroke Patients Only) Modified Rankin: Severe disability    Exercises     PT Diagnosis:    PT Problem List:   PT Treatment Interventions:     PT Goals Acute Rehab PT Goals Time For Goal  Achievement: 09/13/11 Potential to Achieve Goals: Fair PT Goal: Supine/Side to Sit - Progress: Progressing toward goal PT Goal: Sit to Supine/Side - Progress: Progressing toward goal PT Goal: Sit to Stand - Progress: Not met  Visit Information  Last PT Received On: 09/01/11 Assistance Needed: +2    Subjective Data      Cognition  Overall Cognitive Status: Impaired Area of Impairment: Following commands Arousal/Alertness: Awake/alert Behavior During Session: Flat affect Following Commands: Follows one step commands inconsistently    Balance  Balance Balance Assessed: Yes Static Sitting Balance Static Sitting - Balance Support: Left upper extremity supported;Feet supported Static Sitting - Level of Assistance: 3: Mod assist Static Sitting - Comment/# of Minutes: 10; able to assist with L UE initially, then fatigued and need mod assist.  Tended to llist posteriorly.  End of Session PT - End of Session Activity Tolerance: Patient limited by fatigue Patient left: in bed;with call bell/phone within reach;with family/visitor present Nurse Communication: Mobility status   GP     [name], [name] 09/01/2011, 5:23 PM  09/01/2011  [name], PT [phone] (pager)

## 2011-09-02 ENCOUNTER — Encounter (HOSPITAL_COMMUNITY): Payer: Self-pay | Admitting: Anesthesiology

## 2011-09-02 ENCOUNTER — Inpatient Hospital Stay (HOSPITAL_COMMUNITY): Payer: Medicaid Other | Admitting: Anesthesiology

## 2011-09-02 ENCOUNTER — Encounter (HOSPITAL_COMMUNITY)
Admission: EM | Disposition: A | Discharge status: Rehabilitation Facility | Payer: Self-pay | Source: Home / Self Care | Attending: Neurosurgery

## 2011-09-02 ENCOUNTER — Inpatient Hospital Stay (HOSPITAL_COMMUNITY): Payer: Medicaid Other

## 2011-09-02 HISTORY — PX: VENTRICULOPERITONEAL SHUNT: SHX204

## 2011-09-02 LAB — BLOOD GAS, ARTERIAL
Acid-base deficit: 4.2 mmol/L — ABNORMAL HIGH (ref 0.0–2.0)
FIO2: 0.3 %
MECHVT: 500 mL
PEEP: 5 cmH2O
RATE: 14 resp/min
pCO2 arterial: 39.1 mmHg (ref 35.0–45.0)
pH, Arterial: 7.341 — ABNORMAL LOW (ref 7.350–7.450)
pO2, Arterial: 129 mmHg — ABNORMAL HIGH (ref 80.0–100.0)

## 2011-09-02 LAB — GLUCOSE, CAPILLARY
Glucose-Capillary: 115 mg/dL — ABNORMAL HIGH (ref 70–99)
Glucose-Capillary: 117 mg/dL — ABNORMAL HIGH (ref 70–99)

## 2011-09-02 SURGERY — SHUNT INSERTION VENTRICULAR-PERITONEAL
Anesthesia: General | Site: Head | Laterality: Right | Wound class: Clean

## 2011-09-02 MED ORDER — MIDAZOLAM BOLUS VIA INFUSION
1.0000 mg | INTRAVENOUS | Status: DC | PRN
  Filled 2011-09-02: qty 2

## 2011-09-02 MED ORDER — VECURONIUM BROMIDE 10 MG IV SOLR
INTRAVENOUS | Status: DC | PRN
  Administered 2011-09-02 (×2): 5 mg via INTRAVENOUS

## 2011-09-02 MED ORDER — FENTANYL CITRATE 0.05 MG/ML IJ SOLN
INTRAMUSCULAR | Status: DC | PRN
  Administered 2011-09-02: 100 ug via INTRAVENOUS

## 2011-09-02 MED ORDER — FENTANYL CITRATE 0.05 MG/ML IJ SOLN
50.0000 ug/h | INTRAMUSCULAR | Status: DC
  Administered 2011-09-02: 25 ug/h via INTRAVENOUS
  Filled 2011-09-02 (×2): qty 50

## 2011-09-02 MED ORDER — PROPOFOL 10 MG/ML IV EMUL
INTRAVENOUS | Status: DC | PRN
  Administered 2011-09-02: 200 mg via INTRAVENOUS

## 2011-09-02 MED ORDER — FENTANYL BOLUS VIA INFUSION
50.0000 ug | Freq: Four times a day (QID) | INTRAVENOUS | Status: DC | PRN
  Filled 2011-09-02: qty 100

## 2011-09-02 MED ORDER — BACITRACIN 50000 UNITS IM SOLR
INTRAMUSCULAR | Status: AC
  Filled 2011-09-02: qty 1

## 2011-09-02 MED ORDER — PHENYLEPHRINE HCL 10 MG/ML IJ SOLN
10.0000 mg | INTRAVENOUS | Status: DC | PRN
  Administered 2011-09-02: 20 ug/min via INTRAVENOUS

## 2011-09-02 MED ORDER — BACITRACIN ZINC 500 UNIT/GM EX OINT
TOPICAL_OINTMENT | CUTANEOUS | Status: DC | PRN
  Administered 2011-09-02: 1 via TOPICAL

## 2011-09-02 MED ORDER — ALBUTEROL SULFATE HFA 108 (90 BASE) MCG/ACT IN AERS
4.0000 | INHALATION_SPRAY | RESPIRATORY_TRACT | Status: DC
  Administered 2011-09-02 – 2011-09-03 (×3): 4 via RESPIRATORY_TRACT
  Filled 2011-09-02: qty 6.7

## 2011-09-02 MED ORDER — BIOTENE DRY MOUTH MT LIQD
15.0000 mL | Freq: Four times a day (QID) | OROMUCOSAL | Status: DC
  Administered 2011-09-03 (×2): 15 mL via OROMUCOSAL

## 2011-09-02 MED ORDER — HEMOSTATIC AGENTS (NO CHARGE) OPTIME
TOPICAL | Status: DC | PRN
  Administered 2011-09-02: 1 via TOPICAL

## 2011-09-02 MED ORDER — LIDOCAINE HCL (CARDIAC) 20 MG/ML IV SOLN
INTRAVENOUS | Status: DC | PRN
  Administered 2011-09-02: 100 mg via INTRAVENOUS

## 2011-09-02 MED ORDER — SUCCINYLCHOLINE CHLORIDE 20 MG/ML IJ SOLN
INTRAMUSCULAR | Status: DC | PRN
  Administered 2011-09-02: 100 mg via INTRAVENOUS

## 2011-09-02 MED ORDER — LIDOCAINE-EPINEPHRINE 1 %-1:100000 IJ SOLN
INTRAMUSCULAR | Status: DC | PRN
  Administered 2011-09-02: 4 mL

## 2011-09-02 MED ORDER — PHENYLEPHRINE HCL 10 MG/ML IJ SOLN
INTRAMUSCULAR | Status: DC | PRN
  Administered 2011-09-02: 50 ug via INTRAVENOUS
  Administered 2011-09-02 (×2): 100 ug via INTRAVENOUS

## 2011-09-02 MED ORDER — CEFAZOLIN SODIUM-DEXTROSE 2-3 GM-% IV SOLR
INTRAVENOUS | Status: AC
  Administered 2011-09-02: 2 g via INTRAVENOUS
  Filled 2011-09-02: qty 50

## 2011-09-02 MED ORDER — LACTATED RINGERS IV SOLN
INTRAVENOUS | Status: DC | PRN
  Administered 2011-09-02: 15:00:00 via INTRAVENOUS

## 2011-09-02 MED ORDER — SODIUM CHLORIDE 0.9 % IV SOLN
2.0000 mg/h | INTRAVENOUS | Status: DC
  Filled 2011-09-02: qty 10

## 2011-09-02 MED ORDER — SODIUM CHLORIDE 0.9 % IR SOLN
Status: DC | PRN
  Administered 2011-09-02: 17:00:00

## 2011-09-02 MED ORDER — SODIUM CHLORIDE 0.9 % IV SOLN
INTRAVENOUS | Status: AC
  Filled 2011-09-02: qty 500

## 2011-09-02 MED ORDER — BUPIVACAINE HCL (PF) 0.5 % IJ SOLN
INTRAMUSCULAR | Status: DC | PRN
  Administered 2011-09-02: 4 mL

## 2011-09-02 MED ORDER — THROMBIN 20000 UNITS EX KIT
PACK | CUTANEOUS | Status: DC | PRN
  Administered 2011-09-02: 20000 [IU] via TOPICAL

## 2011-09-02 MED ORDER — BUPIVACAINE-EPINEPHRINE 0.5% -1:200000 IJ SOLN
INTRAMUSCULAR | Status: DC | PRN
  Administered 2011-09-02: 4 mL

## 2011-09-02 MED ORDER — SODIUM CHLORIDE 0.9 % IR SOLN
Status: DC | PRN
  Administered 2011-09-02: 1000 mL

## 2011-09-02 SURGICAL SUPPLY — 81 items
BAG DECANTER FOR FLEXI CONT (MISCELLANEOUS) ×3 IMPLANT
BENZOIN TINCTURE PRP APPL 2/3 (GAUZE/BANDAGES/DRESSINGS) ×9 IMPLANT
BLADE SURG 10 STRL SS (BLADE) ×6 IMPLANT
BLADE SURG 11 STRL SS (BLADE) ×3 IMPLANT
BLADE SURG 15 STRL LF DISP TIS (BLADE) ×2 IMPLANT
BLADE SURG 15 STRL SS (BLADE) ×1
BLADE SURG ROTATE 9660 (MISCELLANEOUS) ×6 IMPLANT
BOOT SUTURE AID YELLOW STND (SUTURE) ×3 IMPLANT
BRUSH SCRUB EZ PLAIN DRY (MISCELLANEOUS) ×6 IMPLANT
BUR ACORN 6.0 PRECISION (BURR) ×3 IMPLANT
CANISTER SUCTION 2500CC (MISCELLANEOUS) ×3 IMPLANT
CATH ROBINSON RED A/P 10FR (CATHETERS) IMPLANT
CLOTH BEACON ORANGE TIMEOUT ST (SAFETY) ×3 IMPLANT
CONT SPEC 4OZ CLIKSEAL STRL BL (MISCELLANEOUS) ×6 IMPLANT
CORDS BIPOLAR (ELECTRODE) ×3 IMPLANT
COVER MAYO STAND STRL (DRAPES) ×3 IMPLANT
DECANTER SPIKE VIAL GLASS SM (MISCELLANEOUS) ×9 IMPLANT
DERMABOND ADVANCED (GAUZE/BANDAGES/DRESSINGS) ×1
DERMABOND ADVANCED .7 DNX12 (GAUZE/BANDAGES/DRESSINGS) ×2 IMPLANT
DRAPE INCISE IOBAN 85X60 (DRAPES) ×3 IMPLANT
DRAPE ORTHO SPLIT 77X108 STRL (DRAPES) ×2
DRAPE POUCH INSTRU U-SHP 10X18 (DRAPES) ×3 IMPLANT
DRAPE SURG ORHT 6 SPLT 77X108 (DRAPES) ×4 IMPLANT
DRESSING TELFA 8X3 (GAUZE/BANDAGES/DRESSINGS) ×3 IMPLANT
DRSG OPSITE 4X5.5 SM (GAUZE/BANDAGES/DRESSINGS) ×3 IMPLANT
ELECT CAUTERY BLADE 6.4 (BLADE) ×3 IMPLANT
ELECT REM PT RETURN 9FT ADLT (ELECTROSURGICAL) ×3
ELECTRODE REM PT RTRN 9FT ADLT (ELECTROSURGICAL) ×2 IMPLANT
GAUZE SPONGE 2X2 8PLY STRL LF (GAUZE/BANDAGES/DRESSINGS) ×4 IMPLANT
GAUZE SPONGE 4X4 16PLY XRAY LF (GAUZE/BANDAGES/DRESSINGS) ×3 IMPLANT
GLOVE BIO SURGEON STRL SZ8 (GLOVE) ×3 IMPLANT
GLOVE BIOGEL PI IND STRL 8 (GLOVE) ×6 IMPLANT
GLOVE BIOGEL PI IND STRL 8.5 (GLOVE) ×2 IMPLANT
GLOVE BIOGEL PI INDICATOR 8 (GLOVE) ×3
GLOVE BIOGEL PI INDICATOR 8.5 (GLOVE) ×1
GLOVE ECLIPSE 7.5 STRL STRAW (GLOVE) ×3 IMPLANT
GLOVE ECLIPSE 8.0 STRL XLNG CF (GLOVE) ×3 IMPLANT
GLOVE EXAM NITRILE LRG STRL (GLOVE) IMPLANT
GLOVE EXAM NITRILE MD LF STRL (GLOVE) ×9 IMPLANT
GLOVE EXAM NITRILE XL STR (GLOVE) IMPLANT
GLOVE EXAM NITRILE XS STR PU (GLOVE) IMPLANT
GOWN BRE IMP SLV AUR LG STRL (GOWN DISPOSABLE) ×6 IMPLANT
GOWN BRE IMP SLV AUR XL STRL (GOWN DISPOSABLE) ×6 IMPLANT
GOWN STRL NON-REIN LRG LVL3 (GOWN DISPOSABLE) ×6 IMPLANT
GOWN STRL REIN 2XL LVL4 (GOWN DISPOSABLE) ×3 IMPLANT
HEMOSTAT SURGICEL 2X14 (HEMOSTASIS) IMPLANT
KIT BASIN OR (CUSTOM PROCEDURE TRAY) ×3 IMPLANT
KIT ROOM TURNOVER OR (KITS) ×3 IMPLANT
NEEDLE HYPO 25X1 1.5 SAFETY (NEEDLE) ×3 IMPLANT
NS IRRIG 1000ML POUR BTL (IV SOLUTION) ×3 IMPLANT
PACK EENT II TURBAN DRAPE (CUSTOM PROCEDURE TRAY) ×3 IMPLANT
PAD ARMBOARD 7.5X6 YLW CONV (MISCELLANEOUS) ×12 IMPLANT
PATTIES SURGICAL .5 X.5 (GAUZE/BANDAGES/DRESSINGS) ×3 IMPLANT
PEEL AWAY INTRODUCER SET ×3 IMPLANT
PENCIL BUTTON HOLSTER BLD 10FT (ELECTRODE) ×3 IMPLANT
SHEATH PERITONEAL INTRO 46 (MISCELLANEOUS) IMPLANT
SHEATH PERITONEAL INTRO 61 (MISCELLANEOUS) IMPLANT
SLEEVE SURGEON STRL (DRAPES) ×3 IMPLANT
SPONGE GAUZE 2X2 STER 10/PKG (GAUZE/BANDAGES/DRESSINGS) ×2
SPONGE GAUZE 4X4 12PLY (GAUZE/BANDAGES/DRESSINGS) ×3 IMPLANT
SPONGE LAP 4X18 X RAY DECT (DISPOSABLE) ×3 IMPLANT
SPONGE SURGIFOAM ABS GEL 100 (HEMOSTASIS) ×3 IMPLANT
STAPLER SKIN PROX WIDE 3.9 (STAPLE) ×3 IMPLANT
STRIP CLOSURE SKIN 1/2X4 (GAUZE/BANDAGES/DRESSINGS) ×3 IMPLANT
SUT BONE WAX W31G (SUTURE) ×3 IMPLANT
SUT ETHILON 3 0 FSL (SUTURE) ×9 IMPLANT
SUT ETHILON 3 0 PS 1 (SUTURE) ×3 IMPLANT
SUT NURALON 4 0 TR CR/8 (SUTURE) ×3 IMPLANT
SUT SILK 0 TIES 10X30 (SUTURE) ×3 IMPLANT
SUT SILK 2 0 FS (SUTURE) ×3 IMPLANT
SUT SILK 2 0 TIES 10X30 (SUTURE) ×3 IMPLANT
SUT VIC AB 2-0 CP2 18 (SUTURE) ×3 IMPLANT
SUT VIC AB 3-0 SH 8-18 (SUTURE) ×3 IMPLANT
SYR BULB 3OZ (MISCELLANEOUS) ×3 IMPLANT
SYR CONTROL 10ML LL (SYRINGE) ×3 IMPLANT
TAPE CLOTH SURG 4X10 WHT LF (GAUZE/BANDAGES/DRESSINGS) ×3 IMPLANT
TOWEL OR 17X24 6PK STRL BLUE (TOWEL DISPOSABLE) ×3 IMPLANT
TOWEL OR 17X26 10 PK STRL BLUE (TOWEL DISPOSABLE) ×3 IMPLANT
TUBE CONNECTING 12X1/4 (SUCTIONS) ×3 IMPLANT
VALVE PROGRAM W DISTAL CATH (Prosthesis & Implant Heart) ×3 IMPLANT
WATER STERILE IRR 1000ML POUR (IV SOLUTION) ×3 IMPLANT

## 2011-09-02 NOTE — Transfer of Care (Signed)
Immediate Anesthesia Transfer of Care Note  Patient: [name]  Procedure(s) Performed: Procedure(s) (LRB): SHUNT INSERTION VENTRICULAR-PERITONEAL (N/A) LAPAROSCOPIC REVISION VENTRICULAR-PERITONEAL (V-P) SHUNT (N/A)  Patient Location: ICU  Anesthesia Type: General  Level of Consciousness: Patient remains intubated per anesthesia plan  Airway & Oxygen Therapy: Patient remains intubated per anesthesia plan and Patient placed on Ventilator (see vital sign flow sheet for setting)  Post-op Assessment: Report given to PACU RN and Post -op Vital signs reviewed and stable  Post vital signs: Reviewed and stable  Complications: No apparent anesthesia complications

## 2011-09-02 NOTE — Preoperative (Signed)
Beta Blockers   Reason not to administer Beta Blockers:Not Applicable 

## 2011-09-02 NOTE — Progress Notes (Signed)
The patient returned from the OR where he underwent SHUNT INSERTION VENTRICULAR-PERITONEAL (N/A)  LAPAROSCOPIC REVISION VENTRICULAR-PERITONEAL (V-P) SHUNT (N/A).   Initiate continuous sedation.

## 2011-09-02 NOTE — Op Note (Signed)
08/16/2011 - 09/02/2011  5:18 PM  PATIENT:  [name]  61 y.o. female  PRE-OPERATIVE DIAGNOSIS:  Ventriculomegaly, s/p ACA aneurysm rupture  POST-OPERATIVE DIAGNOSIS:  Ventriculomegaly, s/p ACA aneurysm rupture  PROCEDURE:  Procedure(s) (LRB): SHUNT INSERTION VENTRICULAR-PERITONEAL (N/A) LAPAROSCOPIC REVISION VENTRICULAR-PERITONEAL (V-P) SHUNT (N/A)  SURGEON:  Surgeon(s) and Role: Panel 1:    * Maeola Harman, MD - Primary  Panel 2:    * Adolph Pollack, MD - Primary  PHYSICIAN ASSISTANT:   ASSISTANTS: P[place], RN   ANESTHESIA:   general  EBL:  Total I/O In: 890 [I.V.:890] Out: 915 [Urine:915]  BLOOD ADMINISTERED:none  DRAINS: none   LOCAL MEDICATIONS USED:  LIDOCAINE   SPECIMEN:  No Specimen  DISPOSITION OF SPECIMEN:  N/A  COUNTS:  YES  TOURNIQUET:  * No tourniquets in log *  DICTATION: Indications: 61 year old female with symptomatic obstructive hydrocephalus following a subarachnoid hemorrhage who had an IVC which had been removed by the patient and serial CT scans showed progressively worsening ventriculomegaly.  It was elected to take her to surgery for ventriculoperitoneal shunt placement. She has had multiple prior abdominal surgeries.  It was elected for patient to have laparoscopic assisted abdominal catheter placement.  Procedure:  Patient was brought to the operating room and underwent smooth and uncomplicated induction of general endotracheal anesthesia.  Her right scalp, chest and abdomen were shaved, prepped and draped in usual fashion.  Right frontal scalp was infiltrated with lidocaine and an incision was made over coronal suture at mid-pupillary line.  CSF was coming from the previous bur hole and the catheter was inserted down the previous tract without difficulty with brisk flow of CSF.   Shunt was passed to abdomen with a posterior auricular intervening incision.  Tunneler was utilized .   Catheter was attached to valve and anchored with a silk  tie. Catheter had spontaneous flow of CSF.  Catheter was placed in abdominal cavity and incisions were closed by Dr. Abbey Chatters.  Cranial incisions were closed with 2-0 vicryl sutures and 3-0 Nylon sutures over the posterior auricular incisions and staples over the scalp.  Sterile occlusive dressings were placed.  Patient was extubated and taken to recovery in stable condition having tolerated procedure well.  Counts were correct at the end of the case.  PLAN OF CARE: Admit to inpatient   PATIENT DISPOSITION:  PACU - hemodynamically stable.   Delay start of Pharmacological VTE agent (>24hrs) due to surgical blood loss or risk of bleeding: yes

## 2011-09-02 NOTE — Anesthesia Procedure Notes (Signed)
Procedure Name: Intubation Date/Time: 09/02/2011 3:58 PM Performed by: Lovie Chol Pre-anesthesia Checklist: Patient identified, Emergency Drugs available, Suction available, Patient being monitored and Timeout performed Patient Re-evaluated:Patient Re-evaluated prior to inductionOxygen Delivery Method: Circle system utilized Preoxygenation: Pre-oxygenation with 100% oxygen Intubation Type: IV induction Laryngoscope Size: Miller and 3 Grade View: Grade I Tube type: Oral Tube size: 7.5 mm Number of attempts: 1 Airway Equipment and Method: Stylet Placement Confirmation: ETT inserted through vocal cords under direct vision,  positive ETCO2 and breath sounds checked- equal and bilateral Secured at: 21 cm Tube secured with: Tape Dental Injury: Teeth and Oropharynx as per pre-operative assessment

## 2011-09-02 NOTE — Progress Notes (Signed)
Subjective: Patient reports (aphasic)  Objective: Vital signs in last 24 hours: Temp:  [98 F (36.7 C)-99.5 F (37.5 C)] 99.5 F (37.5 C) (08/30 0700) Pulse Rate:  [74-95] 82  (08/30 0900) Resp:  [22-30] 24  (08/30 0900) BP: (111-151)/(59-91) 151/81 mmHg (08/30 0900) SpO2:  [97 %-99 %] 97 % (08/30 0900)  Intake/Output from previous day: 08/29 0701 - 08/30 0700 In: 1110 [I.V.:480; NG/GT:630] Out: 2075 [Urine:1855; Stool:220] Intake/Output this shift: Total I/O In: 40 [I.V.:40] Out: 65 [Urine:65]  Awake, with husband at bedside. Follows with eyes, but does not follow commands. Nonpurposeful movements. Husband states she will grip his hand.  Lab Results: No results found for this basename: WBC:2,HGB:2,HCT:2,PLT:2 in the last 72 hours BMET No results found for this basename: NA:2,K:2,CL:2,CO2:2,GLUCOSE:2,BUN:2,CREATININE:2,CALCIUM:2 in the last 72 hours  Studies/Results: Ct Head Wo Contrast  09/01/2011  *RADIOLOGY REPORT*  Clinical Data: Post aneurysm coiling and subarachnoid hemorrhage. Evaluate ventricles.  CT HEAD WITHOUT CONTRAST  Technique:  Contiguous axial images were obtained from the base of the skull through the vertex without contrast.  Comparison: 08/30/2011  Findings: Ventricles are slightly larger on today's study.  Aneurysm coils in the anterior communicating artery region.  No acute or recurrent hemorrhage.  Hypodensity right  inferior frontal lobe is unchanged and may represent infarction.  Hypodensity in the mid right frontal lobe is unchanged and  may be infarct or injury related to ventricular catheter placement which has been removed.  Small hypodensity left anterior corpus callosum appears more prominent and may represent developing acute infarct.  Hypodensity left putamen may also represent subacute infarction.  IMPRESSION: Ventricle size is slightly larger.  No acute hemorrhage.  Low density areas of infarct in the right frontal lobe are stable.  Hypodensities in  the left anterior corpus callosum and left putamen compatible with evolving subacute infarct.   Original Report Authenticated By: Camelia Phenes, M.D.    Dg Chest Port 1 View  09/01/2011  *RADIOLOGY REPORT*  Clinical Data: Atelectasis, infiltrates  PORTABLE CHEST - 1 VIEW  Comparison: 08/29/2011  Findings: Low lung volumes.  Mild right perihilar/upper lobe opacity is unchanged, possibly atelectasis.  No pleural effusion or pneumothorax.  The heart is normal in size.  Stable right arm PICC and enteric tube.  IMPRESSION: Low lung volumes with stable mild right perihilar/upper lobe opacity, possibly atelectasis.   Original Report Authenticated By: Charline Bills, M.D.     Assessment/Plan:   LOS: 17 days  VP shunt planned for today d/t enlarging ventricles.  Husband aware.   Georgiann Cocker 09/02/2011, 9:47 AM

## 2011-09-02 NOTE — Progress Notes (Signed)
Patient becoming hypertensive and coughing. CCM notified. Sedation medication ordered. Will continue to assess.

## 2011-09-02 NOTE — Progress Notes (Signed)
Vent orders placed  

## 2011-09-02 NOTE — Op Note (Signed)
Operative Note  [name] female 61 y.o. 09/02/2011  PREOPERATIVE DX:  Progressive ventriculomegaly with increased intracranial pressure  POSTOPERATIVE DX:  Same  PROCEDURE:  Laparoscopic-assisted ventriculoperitoneal shunt placement         Surgeon: Adolph Pollack   Co-surgeon: Maeola Harman  Anesthesia: General endotracheal anesthesia  Indications:  This is a 61 year old female who suffered a subarachnoid hemorrhage. She has ventriculomegaly and Ssgns of elevated intracranial pressure. She now presents for the above procedure    Procedure Detail:  She was brought to the operating room placed supine on the operating table and a general anesthetic was administered. The scalp neck chest wall and abdominal walls were sterilely prepped and draped.  A small incision was made in the left upper quadrant. Using a 5 mm Optiview trocar and laparoscope access was gained to the peritoneal cavity. A pneumoperitoneum was created by insufflation of carbon dioxide gas. A second 5 mm trocar was placed inferior to the first trocar in the left abdominal wall. The ventriculoperitoneal shunt catheter was tunneled from the cranium down to the epigastrium and brought out through a small incision in the epigastrium. Through the same incision a 16-gauge needle was introduced into the peritoneal cavity under laparoscopic vision. A wire was then placed through the needle into the peritoneal catheter and the needle was removed. A dilator and introducer were placed over the wire into the abdominal cavity and the dilator and wire were removed leaving the peel-away sheath introducer. The shunt catheter was draining clear fluid. It was placed through the peel-away sheath introducer then grasped intraperitoneally and placed in the left lower quadrant. The peel-away sheath introducer was removed.  The abdominal cavity was inspected and there was no evidence of bleeding or organ injury. The trocars were removed  and the CO2 gas release. The skin incisions were closed with 4-0 Monocryl subcuticular stitches. Steri-Strips and sterile dressings were applied.  She tolerated the procedure well without any apparent complications.   Estimated Blood Loss:  Minimal         Drains: ventriculoperitoneal shunt          Blood Given: none          Specimens: None        Complications:  * No complications entered in OR log *         Disposition: PACU - hemodynamically stable.         Condition: stable

## 2011-09-02 NOTE — Progress Notes (Signed)
As above.  Plan VP shunt this pm for progressive hydrocephalus

## 2011-09-02 NOTE — Anesthesia Postprocedure Evaluation (Signed)
  Anesthesia Post-op Note  Patient: [name]  Procedure(s) Performed: Procedure(s) (LRB): SHUNT INSERTION VENTRICULAR-PERITONEAL (Right) LAPAROSCOPIC REVISION VENTRICULAR-PERITONEAL (V-P) SHUNT (N/A)  Patient Location: ICU  Anesthesia Type: General  Level of Consciousness: sedated and unresponsive  Airway and Oxygen Therapy: Patient remains intubated per anesthesia plan  Post-op Pain: none  Post-op Assessment: Post-op Vital signs reviewed, Patient's Cardiovascular Status Stable and Respiratory Function Stable  Post-op Vital Signs: Reviewed and stable  Complications: No apparent anesthesia complications

## 2011-09-02 NOTE — Progress Notes (Signed)
Name: [name] MRN: [id number] DOB: Jun 20, 1950    LOS: 17  Referring Provider:  Venetia Maxon (nsgy) Reason for Referral:  ICU management   PULMONARY / CRITICAL CARE MEDICINE  Brief Summary:   61 yo with HTN Admitted for diffuse subarachnoid hemorrhage and aspiration pneumonia with VDRF.  S/p coil  7x28mm Acom aneurysm  Interval history:  More awake, open eyes, but aphasic (had been speaking 3 days ago)  Events Since Admit: 8/13  Presented to [facility] with SAH, intubated, extubated to comfort but improved 8/15  Transferred to [facility] 8/16  IR, postop VDRF, IVC 8/18  Increased spontaneous movements on L; increased secretions; failed SBT 8/18 AM due to apnea; ongoing fever 8/22 TCDs better.   8/29  CT Head >> ventricles continued to enlarge slightly  Vital Signs: Temp:  [98 F (36.7 C)-99.5 F (37.5 C)] 99.5 F (37.5 C) (08/30 0700) Pulse Rate:  [74-95] 82  (08/30 0900) Resp:  [22-30] 24  (08/30 0900) BP: (111-151)/(59-91) 151/81 mmHg (08/30 0900) SpO2:  [97 %-99 %] 97 % (08/30 0900)  Intake/Output Summary (Last 24 hours) at 09/02/11 0931 Last data filed at 09/02/11 0600  Gross per 24 hour  Intake   1050 ml  Output   1975 ml  Net   -925 ml    Physical Examination: Gen: tachypneic obese woman, comfortable otherwise HEENT: IVC drain out,  c/d/i, no erythema PULM: decreased bases, otherwise clear CV: RRR, no mgr AB: bS+, soft, nontender Ext: warm, 2+edema Neuro: not following commands or speaking, moves RUE > LUE, opens eyes to voice, tracks  Principal Problem:  *Subarachnoid hemorrhage Active Problems:  Encephalopathy  Aspiration pneumonia  Acute respiratory failure  HTN (hypertension)  Vasospasm  ASSESSMENT AND PLAN  PULMONARY  Lab 08/31/11 0952  PHART 7.422  PCO2ART 32.4*  PO2ART 92.6  HCO3 20.8  O2SAT 96.6   Ventilator Settings:   CXR:  8/29: perihilar and basilar ATX, stable from 8/26, no new infiltrates ETT:   8/13>>8/14, 8/16 >>>8/24  A:  Postop VDRF.  Resolved but high risk needing vent again. She may return from OR 8/30 on MV post VP shunt P:   pulm toilet Appears to be protecting airway  CARDIOVASCULAR No results found for this basename: TROPONINI:5,LATICACIDVEN:5, O2SATVEN:5,PROBNP:5 in the last 168 hours Lines:   8/16  L Fem Sheath>>>8/19 8/16  R PICC >>>  A:  HTN, off cardene drip. BP at goal P:  Cont IV hydralazine need to run BP more toward 180-140 range Cont labetalol   RENAL  Lab 08/30/11 0510 08/29/11 0400 08/28/11 0515 08/27/11 0450  NA 145 142 135 137  K 4.1 4.2 -- --  CL 110 107 98 100  CO2 24 25 27 29   BUN 27* 24* 16 13  CREATININE 0.74 0.85 0.71 0.60  CALCIUM 9.6 9.7 9.4 9.2  MG -- -- -- --  PHOS -- -- -- --   Foley:  8/13>>>  A: Hypokalemia improved.  Normal renal function.  P:   Trend BMP  GASTROINTESTINAL No results found for this basename: AST:5,ALT:5,ALKPHOS:5,BILITOT:5,PROT:5,ALBUMIN:5 in the last 168 hours A:  No acute issues P:   On TF, speech rx signed off >>  she is not ready for PO diet   HEMATOLOGIC  Lab 08/30/11 0510 08/29/11 0400 08/28/11 0515 08/27/11 0450  HGB 10.2* 10.6* 10.6* 10.5*  HCT 32.1* 33.2* 33.0* 32.5*  PLT 343 337 308 250  INR -- -- -- --  APTT -- -- -- --   A:  No acute issue. P:  Trend CBC SCD  INFECTIOUS  Lab 08/30/11 0510 08/29/11 0400 08/28/11 0515 08/27/11 0450  WBC 13.6* 14.3* 14.9* 14.8*  PROCALCITON -- -- -- --   Cultures: 8/17 blood >>neg 8/17 urine >>neg 8/20 resp c/s>>neg 8/21 CDiff>>>neg  Antibiotics: Unasyn 8/13 >>>8/15 Cefazolin 8/15 >> 8/18 Vanc 8/18(asp pna) >>8/21 Zosyn 8/18 (asp pna)>>8/22  A: No active issues P:   No abx at this time   ENDOCRINE  Lab 09/02/11 0418 09/02/11 0026 09/01/11 1945 09/01/11 1701 09/01/11 1157  GLUCAP 94 153* 166* 132* 159*    A:  Hyperglycemia. Better with Lantus and SSI  P:   SSI/CBG Off lantus when off TF, added back 8/26  NEUROLOGIC 8/16 IVC Drain>>>pulled out accidentally 8/25  early AM with CT head trip. Head CT 8/29>> IMPRESSION:  Ventricle size is slightly larger (from 8/27). No acute hemorrhage.  Low density areas of infarct in the right frontal lobe are stable.  Hypodensities in the left anterior corpus callosum and left putamen  compatible with evolving subacute infarct A:   Subarachnoid hemorrhage s/p aneurism coiling Acom aneurysm Acute encephalopathy, improved but still not following commands, now plateuaed Has ischemic areas on R  internal capsule and peripherally R anterior cortex,   No vasospasm on most recent TCDs. CT 8/27 and 28 with slight increase ventricular size after drain pulled P:   Continued slow enlargement of the ventricles. Dr Venetia Maxon following. Her MS has been stable. Plan for VP shunt on 8/30 Nimodipine as ordered, ? Duration, will defer to NSGY  BEST PRACTICE / DISPOSITION Level of Care:  ICU Primary Service:  Neurosurgery Consultants:  Neurology, PCCM, speech rx Code Status:  Full Diet:  TF resumed 8/25, has not passed swallow eval DVT Px: SCD's GI Px:  Protonix Skin Integrity:  Intact Social / Family:    Levy Pupa, MD, PhD 09/02/2011, 9:31 AM [facility] [phone] or if no answer [phone]

## 2011-09-02 NOTE — Anesthesia Preprocedure Evaluation (Addendum)
Anesthesia Evaluation  Patient identified by MRN, date of birth, ID band Patient unresponsive    Reviewed: Allergy & Precautions, H&P , NPO status , Patient's Chart, lab work & pertinent test results, reviewed documented beta blocker date and time   Airway  TM Distance: >3 FB     Dental  (+)    Pulmonary  breath sounds clear to auscultation        Cardiovascular hypertension, Pt. on medications Rhythm:Regular Rate:Normal     Neuro/Psych S/p ACA hemorrhage with enlarged ventricles    GI/Hepatic   Endo/Other    Renal/GU      Musculoskeletal   Abdominal   Peds  Hematology   Anesthesia Other Findings   Reproductive/Obstetrics                          Anesthesia Physical Anesthesia Plan  ASA: IV  Anesthesia Plan: General   Post-op Pain Management:    Induction: Intravenous  Airway Management Planned: Oral ETT  Additional Equipment:   Intra-op Plan:   Post-operative Plan: Possible Post-op intubation/ventilation  Informed Consent:   Consent reviewed with POA  Plan Discussed with: CRNA, Anesthesiologist and Surgeon  Anesthesia Plan Comments:         Anesthesia Quick Evaluation

## 2011-09-03 ENCOUNTER — Inpatient Hospital Stay (HOSPITAL_COMMUNITY): Payer: Medicaid Other

## 2011-09-03 LAB — CBC
HCT: 31.2 % — ABNORMAL LOW (ref 36.0–46.0)
Hemoglobin: 9.5 g/dL — ABNORMAL LOW (ref 12.0–15.0)
MCHC: 30.4 g/dL (ref 30.0–36.0)

## 2011-09-03 LAB — GLUCOSE, CAPILLARY
Glucose-Capillary: 88 mg/dL (ref 70–99)
Glucose-Capillary: 133 mg/dL — ABNORMAL HIGH (ref 70–99)

## 2011-09-03 LAB — PHOSPHORUS: Phosphorus: 4.7 mg/dL — ABNORMAL HIGH (ref 2.3–4.6)

## 2011-09-03 LAB — BASIC METABOLIC PANEL
CO2: 23 mEq/L (ref 19–32)
Calcium: 9.8 mg/dL (ref 8.4–10.5)
Creatinine, Ser: 0.84 mg/dL (ref 0.50–1.10)
Glucose, Bld: 127 mg/dL — ABNORMAL HIGH (ref 70–99)
Sodium: 148 mEq/L — ABNORMAL HIGH (ref 135–145)

## 2011-09-03 LAB — PROTIME-INR: INR: 1.21 (ref 0.00–1.49)

## 2011-09-03 NOTE — Progress Notes (Signed)
General surgery attending note:  History: POD #1, laparoscopic assisted ventriculoperitoneal shunt placement. Remains on a ventilator. Has  A Panda tube and oral gastric tube. Currently tube feedings being held.  Exam: Abdomen soft, nondistended, active bowel sounds. The abdominal wounds looked fine.  Assessment: POD #1. Lap assisted VP shunt placement. Abdominal wounds looked fine. No clinical evidence of ileus.  Plan: Okay to begin tube feedings at your discretion. We will sign off. Please call us if further problems arise.    Angelia Mould. Derrell Lolling, M.D., [facility] General and Minimally invasive Surgery Breast and Colorectal Surgery Office:   [phone] Pager:   [phone]

## 2011-09-03 NOTE — Progress Notes (Signed)
Subjective: Patient extubated earlier this morning. Breathing comfortably. NG tube in place.  Objective: Vital signs in last 24 hours: Filed Vitals:   09/03/11 0600 09/03/11 0700 09/03/11 0759 09/03/11 0800  BP: 116/63 112/59  141/64  Pulse: 62 68  68  Temp:  98.4 F (36.9 C)    TempSrc:  Oral    Resp: 15 19  18   Height:      Weight:      SpO2: 99% 100% 100% 100%    Intake/Output from previous day: 08/30 0701 - 08/31 0700 In: 1471.1 [I.V.:1221.1] Out: 1867 [Urine:1617; Stool:200; Blood:50] Intake/Output this shift: Total I/O In: 102.5 [I.V.:22.5; NG/GT:80] Out: 125 [Urine:125]  Physical Exam:  Awake and tracking at times, but not following commands. Some spontaneous movement of extremities. Dressing is clean and dry.  CBC  Basename 09/03/11 0430  WBC 9.2  HGB 9.5*  HCT 31.2*  PLT 277   BMET  Basename 09/03/11 0430  NA 148*  K 3.9  CL 115*  CO2 23  GLUCOSE 127*  BUN 41*  CREATININE 0.84  CALCIUM 9.8   ABG    Component Value Date/Time   PHART 7.341* 09/02/2011 2105   PCO2ART 39.1 09/02/2011 2105   PO2ART 129.0* 09/02/2011 2105   HCO3 20.6 09/02/2011 2105   TCO2 21.8 09/02/2011 2105   ACIDBASEDEF 4.2* 09/02/2011 2105   O2SAT 98.2 09/02/2011 2105    Studies/Results: Dg Chest Port 1 View  09/03/2011  *RADIOLOGY REPORT*  Clinical Data: Hypoxemia  PORTABLE CHEST - 1 VIEW  Comparison:   the previous day's study  Findings: Endotracheal tube, nasogastric tube, right arm PICC, and VP shunt catheter are again noted.  Low lung volumes with some patchy bibasilar subsegmental atelectasis.  Heart size upper limits normal.  No effusion.  IMPRESSION:  1.  Stable appearance since previous day's exam   Original Report Authenticated By: Osa Craver, M.D.    Portable Chest Xray  09/02/2011  *RADIOLOGY REPORT*  Clinical Data: Status post intubation.  PORTABLE CHEST - 1 VIEW  Comparison: 09/01/2011  Findings: An endotracheal tube is in place with the tip approximately 2  cm above the carina.  Nasogastric and feeding tubes extend into the stomach.  Central line positioning stable.  No pneumothorax.  No overt edema or pulmonary consolidation.  Heart size is stable.  IMPRESSION: Endotracheal tube tip approximately 2 cm above the carina.   Original Report Authenticated By: Reola Calkins, M.D.     Assessment/Plan: Stable following ventricular peritoneal shunting yesterday. Continue supportive care.   Hewitt Shorts, MD 09/03/2011, 10:06 AM

## 2011-09-03 NOTE — Progress Notes (Signed)
Name: [name] MRN: [id number] DOB: 1950-06-04    LOS: 18  Referring Provider:  Venetia Maxon (nsgy) Reason for Referral:  ICU management   PULMONARY / CRITICAL CARE MEDICINE  Brief Summary:   61 yo female admitted to [facility] 08/16/2011 with severe headache, and altered mental status from diffuse SAH.  Pt was intubated in ER for airway protection and aspiration PNA.  She was initially felt to have poor prognosis, and plan was for comfort care.  However, she showed neurologic improvement, and code status reversed. She was then transferred to [facility] on 8/15 for further therapy.  She has been tx with HHH therapy, IVC drain, and aneurysm coiling.  She required VP shunt 8/30. PMHx HTN.  Events Since Admit: 8/13 Admit [facility] 8/15 Transfer to [facility] 8/15 IVC drain inserted 8/16 Coiling of approx 7mm x 4mm ACom aneurysm. 8/29 Ventriculomegaly on CT head 8/30 VP shunt insertion  Current status: Tolerating SBT  Vital Signs: Temp:  [97.8 F (36.6 C)-99.1 F (37.3 C)] 98.4 F (36.9 C) (08/31 0700) Pulse Rate:  [62-92] 68  (08/31 0800) Resp:  [14-25] 18  (08/31 0800) BP: (108-178)/(58-101) 141/64 mmHg (08/31 0800) SpO2:  [96 %-100 %] 100 % (08/31 0800) FiO2 (%):  [30 %-40 %] 30 % (08/31 0800) Weight:  [211 lb 6.7 oz (95.9 kg)] 211 lb 6.7 oz (95.9 kg) (08/31 0400)  Intake/Output Summary (Last 24 hours) at 09/03/11 0831 Last data filed at 09/03/11 0800  Gross per 24 hour  Intake 1553.58 ml  Output   1927 ml  Net -373.42 ml    Physical Examination: Gen: No distress HEENT: ETT in place PULM: no wheeze/rales CV: s1s2 regular AB: soft, non tender Ext: 1+ edema Neuro: alert, follows simple commands  Dg Chest Port 1 View  09/03/2011  *RADIOLOGY REPORT*  Clinical Data: Hypoxemia  PORTABLE CHEST - 1 VIEW  Comparison:   the previous day's study  Findings: Endotracheal tube, nasogastric tube, right arm PICC, and VP shunt catheter are again noted.  Low lung volumes with some patchy bibasilar  subsegmental atelectasis.  Heart size upper limits normal.  No effusion.  IMPRESSION:  1.  Stable appearance since previous day's exam   Original Report Authenticated By: Osa Craver, M.D.    Portable Chest Xray  09/02/2011  *RADIOLOGY REPORT*  Clinical Data: Status post intubation.  PORTABLE CHEST - 1 VIEW  Comparison: 09/01/2011  Findings: An endotracheal tube is in place with the tip approximately 2 cm above the carina.  Nasogastric and feeding tubes extend into the stomach.  Central line positioning stable.  No pneumothorax.  No overt edema or pulmonary consolidation.  Heart size is stable.  IMPRESSION: Endotracheal tube tip approximately 2 cm above the carina.   Original Report Authenticated By: Reola Calkins, M.D.      ASSESSMENT AND PLAN  PULMONARY  Lab 09/02/11 2105 08/31/11 0952  PHART 7.341* 7.422  PCO2ART 39.1 32.4*  PO2ART 129.0* 92.6  HCO3 20.6 20.8  O2SAT 98.2 96.6   Ventilator Settings: Vent Mode:  [-] CPAP FiO2 (%):  [30 %-40 %] 30 % Set Rate:  [14 bmp] 14 bmp Vt Set:  [500 mL] 500 mL PEEP:  [5 cmH20] 5 cmH20 Pressure Support:  [5 cmH20] 5 cmH20 Plateau Pressure:  [17 cmH20-20 cmH20] 17 cmH20  ETT:   8/13>>8/14 8/16 >>>8/24 8/30>>8/31  A: Acute respiratory failure after VP shunt insertion. P:   Proceed with extubation 8/31 Titrate oxygen to keep SpO2 > 92% Bronchial hygiene  No hx of smoking/obstructive lung disease>>change BD's to prn  CARDIOVASCULAR  Lines:   8/16  L Fem Sheath>>>8/19 8/16  R PICC >>>  A:  HTN. P:  Continue hydralazine, labetalol, nimodipine  RENAL  Lab 09/03/11 0430 08/30/11 0510 08/29/11 0400 08/28/11 0515  NA 148* 145 142 135  K 3.9 4.1 -- --  CL 115* 110 107 98  CO2 23 24 25 27   BUN 41* 27* 24* 16  CREATININE 0.84 0.74 0.85 0.71  CALCIUM 9.8 9.6 9.7 9.4  MG 2.1 -- -- --  PHOS 4.7* -- -- --   Foley:  8/13>>>  A: Hypokalemia improved. P:   F/u and replace electrolytes as needed  GASTROINTESTINAL  A:  Dysphagia>>speech signed off for now. Nutrition. P:   Continue tube feeds  HEMATOLOGIC  Lab 09/03/11 0430 08/30/11 0510 08/29/11 0400 08/28/11 0515  HGB 9.5* 10.2* 10.6* 10.6*  HCT 31.2* 32.1* 33.2* 33.0*  PLT 277 343 337 308  INR 1.21 -- -- --  APTT -- -- -- --   A: Anemia. P:  F/u CBC intermittently  INFECTIOUS  Lab 09/03/11 0430 08/30/11 0510 08/29/11 0400 08/28/11 0515  WBC 9.2 13.6* 14.3* 14.9*  PROCALCITON -- -- -- --   Cultures: 8/17 blood >>neg 8/17 urine >>neg 8/20 resp c/s>>neg 8/21 CDiff>>>neg  Antibiotics: Unasyn 8/13 >>>8/15 Cefazolin 8/15 >> 8/18 Vanc 8/18(asp pna) >>8/21 Zosyn 8/18 (asp pna)>>8/22  A: Aspiration pneumonia>>resolved. P:   Monitor off abx  ENDOCRINE  Lab 09/03/11 0747 09/03/11 0340 09/02/11 2335 09/02/11 1948 09/02/11 1201  GLUCAP 88 132* 117* 115* 118*    A:  Hyperglycemia. P:   SSI/CBG Off lantus when off TF  NEUROLOGIC 8/16 IVC Drain>>>8/25  A: SAH s/p coiling to Acom with acute encephalopathy>>vasospasm improved.   Ventriculomegaly>>VP shunt 8/30. Rt internal capsule and Rt anterior cortex ischemic stroke. P:   Per neurosurgery Keppra for seizure prophylaxis PT  BEST PRACTICE / DISPOSITION Level of Care:  ICU Primary Service:  Neurosurgery Consultants:  Neurology, PCCM, speech rx Code Status:  Full Diet:  TF  DVT Px: SCD's GI Px:  Protonix Skin Integrity:  Intact Social / Family:  Updated family at bedside.  Critical care time 35 minutes.  Coralyn Helling, MD [facility] 09/03/2011, 8:54 AM Pager:  [phone] After 3pm call: [phone]

## 2011-09-03 NOTE — Progress Notes (Signed)
Panda feeding tube came out of pt's mouth during extubation. MD aware. Will replace with NG tube. Pt and family aware of plan.

## 2011-09-04 LAB — BASIC METABOLIC PANEL
BUN: 42 mg/dL — ABNORMAL HIGH (ref 6–23)
Calcium: 9.8 mg/dL (ref 8.4–10.5)
GFR calc Af Amer: 88 mL/min — ABNORMAL LOW (ref >90)
GFR calc non Af Amer: 76 mL/min — ABNORMAL LOW (ref >90)
Potassium: 3.2 mEq/L — ABNORMAL LOW (ref 3.5–5.1)

## 2011-09-04 LAB — GLUCOSE, CAPILLARY: Glucose-Capillary: 136 mg/dL — ABNORMAL HIGH (ref 70–99)

## 2011-09-04 MED ORDER — POTASSIUM CHLORIDE 20 MEQ/15ML (10%) PO LIQD
40.0000 meq | ORAL | Status: AC
  Administered 2011-09-04 (×3): 40 meq
  Filled 2011-09-04 (×3): qty 30

## 2011-09-04 MED ORDER — WHITE PETROLATUM GEL
Status: AC
  Administered 2011-09-04: 02:00:00
  Filled 2011-09-04: qty 5

## 2011-09-04 MED ORDER — FREE WATER
200.0000 mL | Freq: Four times a day (QID) | Status: DC
  Administered 2011-09-04 – 2011-09-05 (×4): 200 mL

## 2011-09-04 MED ORDER — NIMODIPINE 60 MG/20ML PO SOLN
60.0000 mg | ORAL | Status: DC
  Administered 2011-09-04 – 2011-09-06 (×13): 60 mg via ORAL
  Filled 2011-09-04 (×19): qty 20

## 2011-09-04 MED ORDER — POTASSIUM CHLORIDE 20 MEQ/15ML (10%) PO LIQD
ORAL | Status: AC
  Filled 2011-09-04: qty 30

## 2011-09-04 MED ORDER — NIMODIPINE 30 MG/ML ORAL SOLUTION
60.0000 mg | ORAL | Status: DC
  Administered 2011-09-04: 60 mg
  Filled 2011-09-04 (×9): qty 2

## 2011-09-04 NOTE — Progress Notes (Signed)
Subjective: Patient reports more awake, starting to speak and follow commands.  Objective: Vital signs in last 24 hours: Temp:  [98 F (36.7 C)-98.8 F (37.1 C)] 98.3 F (36.8 C) (09/01 0800) Pulse Rate:  [56-86] 78  (09/01 1000) Resp:  [16-25] 19  (09/01 1000) BP: (108-156)/(46-75) 136/64 mmHg (09/01 1000) SpO2:  [96 %-100 %] 96 % (09/01 1000) Weight:  [93.4 kg (205 lb 14.6 oz)] 93.4 kg (205 lb 14.6 oz) (09/01 0500)  Intake/Output from previous day: 08/31 0701 - 09/01 0700 In: 1565 [I.V.:485; NG/GT:1080] Out: 1780 [Urine:1380; Stool:400] Intake/Output this shift: Total I/O In: 475 [I.V.:40; NG/GT:435] Out: 260 [Urine:260]  Physical Exam: Currently sleeping, but has been waking up and following commands, starting to speak.  Will begin work with PT/Rehab consult.  Lab Results:  [facility] 09/03/11 0430  WBC 9.2  HGB 9.5*  HCT 31.2*  PLT 277   BMET  Basename 09/04/11 0519 09/03/11 0430  NA 154* 148*  K 3.2* 3.9  CL 120* 115*  CO2 21 23  GLUCOSE 124* 127*  BUN 42* 41*  CREATININE 0.81 0.84  CALCIUM 9.8 9.8    Studies/Results: Dg Chest Port 1 View  09/03/2011  *RADIOLOGY REPORT*  Clinical Data: Hypoxemia  PORTABLE CHEST - 1 VIEW  Comparison:   the previous day's study  Findings: Endotracheal tube, nasogastric tube, right arm PICC, and VP shunt catheter are again noted.  Low lung volumes with some patchy bibasilar subsegmental atelectasis.  Heart size upper limits normal.  No effusion.  IMPRESSION:  1.  Stable appearance since previous day's exam   Original Report Authenticated By: Osa Craver, M.D.    Portable Chest Xray  09/02/2011  *RADIOLOGY REPORT*  Clinical Data: Status post intubation.  PORTABLE CHEST - 1 VIEW  Comparison: 09/01/2011  Findings: An endotracheal tube is in place with the tip approximately 2 cm above the carina.  Nasogastric and feeding tubes extend into the stomach.  Central line positioning stable.  No pneumothorax.  No overt edema or  pulmonary consolidation.  Heart size is stable.  IMPRESSION: Endotracheal tube tip approximately 2 cm above the carina.   Original Report Authenticated By: Reola Calkins, M.D.     Assessment/Plan: Improving. Will begin work with PT/Rehab consult.     LOS: 19 days    Armel Rabbani D, MD 09/04/2011, 10:31 AM

## 2011-09-04 NOTE — Evaluation (Signed)
Clinical/Bedside Swallow Evaluation Patient Details  Name: SWETA HALSETH MRN: [id number] Date of Birth: [birth year]  Today's Date: 09/04/2011 Time: 1500-1530 SLP Time Calculation (min): 30 min  Past Medical History:  Past Medical History  Diagnosis Date  . Hypertension    Past Surgical History:  Past Surgical History  Procedure Date  . Abdominal hysterectomy    HPI:  61 y/o female admitted to [facility] on 8/13 with headache and acute encephalopathy. Patient with subarachnoidhemorrhage and aspiration PNA,  Patient intubated but extubated for transition to comfort care due to extent of hemorrhage. Patient transferred to [facility] for Neurological intervention due to improvement .  Postop VDRF on 8/16. Intubated 08/19/11 to 08/27/11.  BSE completed on 08/27/11 indicated dysphagia with recommendations of NPO .  Patient  d/c'd from ST treatment due to lack of progress. Patient s/p VP shunt 8/30 with improved LOA.  Patient referred to reassess swallow due to overall cognitive improvement.    Assessment / Plan / Recommendation Clinical Impression  Dramatic improvement from prior week in area of oral and pharyngeal phase of the swallow .  Patient with improved oral acceptance of PO trials with functional anterior to posterior transit.  Subtle signs of aspiration noted moderately after swallow of all PO trials.  No change in vital signs s/p PO trials.  Recommend objective assessment of FEE's prior to initiating PO diet due to history of intubation and length of time patient has remained NPO .  ST to address evaluation on 09/05/11.     Aspiration Risk    Moderate   Diet Recommendation NPO        Other  Recommendations Recommended Consults: FEES Oral Care Recommendations: Oral care QID   Follow Up Recommendations  Inpatient Rehab    Frequency and Duration min 2x/week  2 weeks       SLP Swallow Goals  Pending results of objective assessment   Swallow Study Prior Functional Status   Regular  consistency and thin liquids prior to hospitalization    General Date of Onset: 08/16/11 HPI: 61 y/o female admitted to [facility] on 8/13 with headache and acute encephalopathy. Patient with subarachnoidhemorrhage and aspiration PNA,  Patient intubated but extubated for transition to comfort care due to extent of hemorrhage. Patient transferred to [facility] for Neurological intervention. due to improvement .  Postop VDRF on 8/16. Intubated 08/19/11 to 08/27/11.  BSE completed on 08/27/11 indicated dysphagia with recommendations of NPO .  Patient  d/c'd from ST treatment due to lack of progress. Patient s/p VP shunt 8/30 with improved LOA.  Patient referred to reassess swallow .   Type of Study: Bedside swallow evaluation Previous Swallow Assessment: 08/27/11 Diet Prior to this Study: NPO;Other (Comment) (NG tube) Temperature Spikes Noted: No Respiratory Status: Supplemental O2 delivered via (comment) History of Recent Intubation: Yes Length of Intubations (days): 9 days Date extubated: 08/27/11 Behavior/Cognition: Alert;Cooperative;Pleasant mood;Confused;Requires cueing Oral Cavity - Dentition: Edentulous Self-Feeding Abilities: Total assist Patient Positioning: Upright in chair Baseline Vocal Quality: Hoarse;Breathy;Low vocal intensity Volitional Cough: Cognitively unable to elicit Volitional Swallow: Able to elicit    Oral/Motor/Sensory Function Overall Oral Motor/Sensory Function: Appears within functional limits for tasks assessed   Ice Chips Ice chips: Impaired Presentation: Spoon Pharyngeal Phase Impairments: Suspected delayed Swallow;Throat Clearing - Delayed   Thin Liquid Thin Liquid: Impaired Presentation: Spoon Pharyngeal  Phase Impairments: Suspected delayed Swallow;Decreased hyoid-laryngeal movement;Throat Clearing - Delayed;Cough - Delayed    Nectar Thick Nectar Thick Liquid: Not tested   Honey Thick Honey Thick Liquid:  Not tested   Puree Puree: Impaired Presentation: Spoon Pharyngeal  Phase Impairments: Suspected delayed Swallow;Decreased hyoid-laryngeal movement;Throat Clearing - Delayed   Solid   GO Moreen Fowler M.S., CCC-SLP [phone]  Solid: Not tested       [facility] 09/04/2011,6:49 PM

## 2011-09-04 NOTE — Progress Notes (Signed)
Name: [name] MRN: [id number] DOB: 02-03-1950    LOS: 19  Referring Provider:  Venetia Maxon (nsgy) Reason for Referral:  ICU management   PULMONARY / CRITICAL CARE MEDICINE  Brief Summary:   61 yo female admitted to [facility] 08/16/2011 with severe headache, and altered mental status from diffuse SAH.  Pt was intubated in ER for airway protection and aspiration PNA.  She was initially felt to have poor prognosis, and plan was for comfort care.  However, she showed neurologic improvement, and code status reversed. She was then transferred to [facility] on 8/15 for further therapy.  She has been tx with HHH therapy, IVC drain, and aneurysm coiling.  She required VP shunt 8/30. PMHx HTN.  Events Since Admit: 8/13 Admit [facility] 8/15 Transfer to [facility] 8/15 IVC drain inserted 8/16 Coiling of approx 7mm x 4mm ACom aneurysm. 8/29 Ventriculomegaly on CT head 8/30 VP shunt insertion  Current status: Denies chest pain, abdominal pain.  Vital Signs: Temp:  [98 F (36.7 C)-98.8 F (37.1 C)] 98 F (36.7 C) (09/01 0400) Pulse Rate:  [56-83] 83  (09/01 0700) Resp:  [16-25] 24  (09/01 0700) BP: (108-156)/(46-75) 143/46 mmHg (09/01 0700) SpO2:  [97 %-100 %] 99 % (09/01 0700) Weight:  [205 lb 14.6 oz (93.4 kg)] 205 lb 14.6 oz (93.4 kg) (09/01 0500)  Intake/Output Summary (Last 24 hours) at 09/04/11 0828 Last data filed at 09/04/11 0700  Gross per 24 hour  Intake 1462.5 ml  Output   1655 ml  Net -192.5 ml    Physical Examination: Gen: No distress HEENT: NG in place PULM: no wheeze/rales CV: s1s2 regular AB: soft, non tender Ext: 1+ edema Neuro: alert, follows simple commands, moves extremities  Dg Chest Port 1 View  09/03/2011  *RADIOLOGY REPORT*  Clinical Data: Hypoxemia  PORTABLE CHEST - 1 VIEW  Comparison:   the previous day's study  Findings: Endotracheal tube, nasogastric tube, right arm PICC, and VP shunt catheter are again noted.  Low lung volumes with some patchy bibasilar subsegmental  atelectasis.  Heart size upper limits normal.  No effusion.  IMPRESSION:  1.  Stable appearance since previous day's exam   Original Report Authenticated By: Osa Craver, M.D.    Portable Chest Xray  09/02/2011  *RADIOLOGY REPORT*  Clinical Data: Status post intubation.  PORTABLE CHEST - 1 VIEW  Comparison: 09/01/2011  Findings: An endotracheal tube is in place with the tip approximately 2 cm above the carina.  Nasogastric and feeding tubes extend into the stomach.  Central line positioning stable.  No pneumothorax.  No overt edema or pulmonary consolidation.  Heart size is stable.  IMPRESSION: Endotracheal tube tip approximately 2 cm above the carina.   Original Report Authenticated By: Reola Calkins, M.D.      ASSESSMENT AND PLAN  PULMONARY  ETT:   8/13>>8/14 8/16 >>>8/24 8/30>>8/31  A: Acute respiratory failure after VP shunt insertion>>resolved. P:   Titrate oxygen to keep SpO2 > 92% Bronchial hygiene No hx of smoking/obstructive lung disease>>changed BD's to prn F/u CXR as needed  CARDIOVASCULAR  Lines:   8/16  L Fem Sheath>>>8/19 8/16  R PICC >>>  A:  HTN. P:  Continue hydralazine, labetalol, nimodipine  RENAL  Lab 09/04/11 0519 09/03/11 0430 08/30/11 0510 08/29/11 0400  NA 154* 148* 145 142  K 3.2* 3.9 -- --  CL 120* 115* 110 107  CO2 21 23 24 25   BUN 42* 41* 27* 24*  CREATININE 0.81 0.84 0.74 0.85  CALCIUM 9.8 9.8 9.6 9.7  MG -- 2.1 -- --  PHOS -- 4.7* -- --   Foley:  8/13>>>  A: Hypernatremia. Hypokalemia. P:   Add free water 9/01 Replace K  GASTROINTESTINAL  A: Dysphagia Nutrition. P:   Will ask speech to re-assess swallowing Continue tube feeds  HEMATOLOGIC  Lab 09/03/11 0430 08/30/11 0510 08/29/11 0400  HGB 9.5* 10.2* 10.6*  HCT 31.2* 32.1* 33.2*  PLT 277 343 337  INR 1.21 -- --  APTT -- -- --   A: Anemia. P:  F/u CBC intermittently  INFECTIOUS  Lab 09/03/11 0430 08/30/11 0510 08/29/11 0400  WBC 9.2 13.6* 14.3*    PROCALCITON -- -- --   Cultures: 8/17 blood >>neg 8/17 urine >>neg 8/20 resp c/s>>neg 8/21 CDiff>>>neg  Antibiotics: Unasyn 8/13 >>>8/15 Cefazolin 8/15 >> 8/18 Vanc 8/18(asp pna) >>8/21 Zosyn 8/18 (asp pna)>>8/22  A: Aspiration pneumonia>>resolved. P:   Monitor off abx  ENDOCRINE  Lab 09/04/11 0340 09/03/11 2347 09/03/11 1938 09/03/11 1619 09/03/11 1147  GLUCAP 136* 152* 142* 112* 133*    A:  Hyperglycemia. P:   SSI/CBG Off lantus when off TF  NEUROLOGIC 8/16 IVC Drain>>>8/25  A: SAH s/p coiling to Acom with acute encephalopathy>>vasospasm improved.   Ventriculomegaly>>VP shunt 8/30>>mental status improved after. Rt internal capsule and Rt anterior cortex ischemic stroke. P:   Per neurosurgery Keppra for seizure prophylaxis PT  BEST PRACTICE / DISPOSITION Level of Care:  ICU Primary Service:  Neurosurgery Consultants:  Neurology, PCCM, speech rx Code Status:  Full Diet:  TF  DVT Px: SCD's GI Px:  Protonix Skin Integrity:  Intact Social / Family: No family at bedside.  Coralyn Helling, MD [facility] Pulmonary/Critical Care 09/04/2011, 8:28 AM Pager:  [phone] After 3pm call: [phone]

## 2011-09-05 LAB — CBC
HCT: 31.5 % — ABNORMAL LOW (ref 36.0–46.0)
Hemoglobin: 9.8 g/dL — ABNORMAL LOW (ref 12.0–15.0)
MCH: 26.6 pg (ref 26.0–34.0)
RBC: 3.69 MIL/uL — ABNORMAL LOW (ref 3.87–5.11)

## 2011-09-05 LAB — BASIC METABOLIC PANEL
CO2: 23 mEq/L (ref 19–32)
Chloride: 120 mEq/L — ABNORMAL HIGH (ref 96–112)
GFR calc non Af Amer: 88 mL/min — ABNORMAL LOW (ref >90)
Glucose, Bld: 197 mg/dL — ABNORMAL HIGH (ref 70–99)
Potassium: 3.9 mEq/L (ref 3.5–5.1)
Sodium: 153 mEq/L — ABNORMAL HIGH (ref 135–145)

## 2011-09-05 LAB — GLUCOSE, CAPILLARY
Glucose-Capillary: 139 mg/dL — ABNORMAL HIGH (ref 70–99)
Glucose-Capillary: 142 mg/dL — ABNORMAL HIGH (ref 70–99)
Glucose-Capillary: 140 mg/dL — ABNORMAL HIGH (ref 70–99)
Glucose-Capillary: 189 mg/dL — ABNORMAL HIGH (ref 70–99)

## 2011-09-05 MED ORDER — FREE WATER
200.0000 mL | Status: DC
  Administered 2011-09-05 – 2011-09-06 (×7): 200 mL

## 2011-09-05 MED ORDER — HYDRALAZINE HCL 10 MG PO TABS
20.0000 mg | ORAL_TABLET | Freq: Three times a day (TID) | ORAL | Status: DC
  Administered 2011-09-05 – 2011-09-06 (×3): 20 mg
  Filled 2011-09-05 (×6): qty 2

## 2011-09-05 NOTE — Evaluation (Signed)
Occupational Therapy Evaluation Patient Details Name: [name] MRN: [id number] DOB: 1950-08-22 Today's Date: 09/05/2011 Time: 4098-1191 OT Time Calculation (min): 25 min  OT Assessment / Plan / Recommendation Clinical Impression  61 yo female admitted for [facility] and aneurysm s/p coiling. Pt with 21 day LOS now and very deconditioned. Pt with minimal verbalizations and unable to problem solve answer with choices. OT to follow acutely. Recommend CIR for d/c planning    OT Assessment  Patient needs continued OT Services    Follow Up Recommendations  Inpatient Rehab    Barriers to Discharge      Equipment Recommendations  Defer to next venue    Recommendations for Other Services Rehab consult  Frequency  Min 2X/week    Precautions / Restrictions Precautions Precautions: Fall Precaution Comments: multiple lines and leads including rectal pouch Restrictions Weight Bearing Restrictions: No   Pertinent Vitals/Pain None verbalized and shakes head No when asked    ADL  Toilet Transfer: Simulated;+2 Total assistance Toilet Transfer: Patient Percentage: 40% Toilet Transfer Method: Sit to stand Toilet Transfer Equipment: Raised toilet seat with arms (or 3-in-1 over toilet) Toileting - Clothing Manipulation and Hygiene: Simulated;+2 Total assistance Toileting - Clothing Manipulation and Hygiene: Patient Percentage: 0% Equipment Used: Gait belt Transfers/Ambulation Related to ADLs: pt sit<>stand only from chair. pt demonstrates hyperextension bil LE. Pt shifting weight at times however unable to take a step or lift LE off floor. pt bracing bil LE against chair for support. Pt requires blocking BIL LE with support of chair removed and hyper extension prevented ADL Comments: Pt in chair on arrival in [place] pad. Pt agreeable to sit<>stand from chair. Pt provided straight back chair in front of patient as a support and surface to place BIL UE. Pt with leaking from rectal pouch noted. Pt  static standing for >3 minutes. Pt required hand over hand placement on back of chair. Pt holding chair with Lt hand and not with Rt hand. Rt UE weakness noted.     OT Diagnosis: Generalized weakness;Cognitive deficits;Hemiplegia dominant side  OT Problem List: Decreased strength;Decreased range of motion;Decreased activity tolerance;Impaired balance (sitting and/or standing);Decreased cognition;Decreased safety awareness;Decreased knowledge of use of DME or AE;Decreased knowledge of precautions;Impaired UE functional use OT Treatment Interventions: Self-care/ADL training;Therapeutic exercise;Neuromuscular education;DME and/or AE instruction;Therapeutic activities;Cognitive remediation/compensation;Patient/family education;Balance training   OT Goals Acute Rehab OT Goals OT Goal Formulation: Patient unable to participate in goal setting Time For Goal Achievement: 09/19/11 Potential to Achieve Goals: Good ADL Goals Pt Will Perform Grooming: with min assist;Supported;Sitting, chair;with cueing (comment type and amount) ADL Goal: Grooming - Progress: Goal set today Pt Will Perform Upper Body Bathing: with min assist;Sitting, chair;Supported;with cueing (comment type and amount) ADL Goal: Upper Body Bathing - Progress: Goal set today Pt Will Perform Upper Body Dressing: with min assist;Sitting, chair;Supported;with cueing (comment type and amount) ADL Goal: Upper Body Dressing - Progress: Goal set today Pt Will Transfer to Toilet: with 1+ total assist;Stand pivot transfer;3-in-1 ADL Goal: Toilet Transfer - Progress: Goal set today Miscellaneous OT Goals Miscellaneous OT Goal #1: Pt will complete bed mobility Mod (A) supine <>sit with HOB 30 degrees or less as precursor to adls OT Goal: Miscellaneous Goal #1 - Progress: Goal set today Miscellaneous OT Goal #2: pt will follow two step command with no visual cues  OT Goal: Miscellaneous Goal #2 - Progress: Goal set today  Visit Information  Last  OT Received On: 09/05/11 Assistance Needed: +2 PT/OT Co-Evaluation/Treatment: Yes    Subjective Data  Subjective: "Yes I know"- pt response to questioning cues about orientation howver pt unable to verbalize or choose with optinos Patient Stated Goal: none provided by patient   Prior Functioning  Vision/Perception  Home Living Lives With: Spouse Available Help at Discharge: Family Type of Home: House Home Layout: One level Bathroom Shower/Tub: Tub/shower unit;Door [employer] Toilet: Standard Bathroom Accessibility: Yes How Accessible: Accessible via wheelchair;Accessible via walker Home Adaptive Equipment: None Prior Function Level of Independence: Independent Able to Take Stairs?: Yes Driving: Yes Vocation: On disability 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 Communication: Expressive difficulties;Receptive difficulties Dominant Hand: Right      Cognition  Overall Cognitive Status: Impaired Area of Impairment: Following commands;Attention;Awareness of errors Arousal/Alertness: Awake/alert Orientation Level: Disoriented X4 Behavior During Session: Flat affect Current Attention Level: Focused Following Commands: Follows one step commands inconsistently Awareness of Errors: Assistance required to identify errors made;Assistance required to correct errors made    Extremity/Trunk Assessment Right Upper Extremity Assessment RUE ROM/Strength/Tone: Deficits RUE ROM/Strength/Tone Deficits: strength 3- out 5 noted, grasp 3- out 5 RUE Coordination: Deficits Left Upper Extremity Assessment LUE ROM/Strength/Tone: Deficits LUE ROM/Strength/Tone Deficits: 3 out 5 ROM LUE Coordination: WFL - gross motor   Mobility  Shoulder Instructions  Transfers Sit to Stand: 1: +2 Total assist;With upper extremity assist;From chair/3-in-1 Sit to Stand: Patient Percentage: 40% Stand to Sit: 1: +2 Total assist;With upper extremity assist;To chair/3-in-1 Stand to Sit: Patient  Percentage: 30% Details for Transfer Assistance: pt unable to shift weight and lift bil LE. Pt required tactile and physical (A) from therapist       Exercise     Balance     End of Session OT - End of Session Activity Tolerance: Patient tolerated treatment well Patient left: in chair;with call bell/phone within reach Nurse Communication: Mobility status;Need for lift equipment;Precautions  GO   Pt did perseverate during session on shaking head yes to most questions even when verbal answer needed.   Harrel Carina [facility] 09/05/2011, 11:42 AM Pager: [phone]

## 2011-09-05 NOTE — Progress Notes (Signed)
Subjective: Patient reports states name, answers questions  Objective: Vital signs in last 24 hours: Temp:  [97.5 F (36.4 C)-98.4 F (36.9 C)] 98 F (36.7 C) (09/02 0400) Pulse Rate:  [73-94] 85  (09/02 0600) Resp:  [14-24] 21  (09/02 0600) BP: (92-148)/(46-86) 129/63 mmHg (09/02 0600) SpO2:  [96 %-99 %] 99 % (09/02 0600) Weight:  [95.2 kg (209 lb 14.1 oz)] 95.2 kg (209 lb 14.1 oz) (09/02 0500)  Intake/Output from previous day: 09/01 0701 - 09/02 0700 In: 2165 [I.V.:40; NG/GT:2125] Out: 1860 [Urine:1660; Stool:200] Intake/Output this shift: Total I/O In: 895 [NG/GT:895] Out: 1065 [Urine:1065]  Physical Exam: F/C all 4 extremities.  States name, answers questions, walked with PT today.  Up in chair.  Dressings CDI  Lab Results:  Basename 09/05/11 0440 09/03/11 0430  WBC 8.4 9.2  HGB 9.8* 9.5*  HCT 31.5* 31.2*  PLT 251 277   BMET  Basename 09/05/11 0440 09/04/11 0519  NA 153* 154*  K 3.9 3.2*  CL 120* 120*  CO2 23 21  GLUCOSE 197* 124*  BUN 40* 42*  CREATININE 0.77 0.81  CALCIUM 9.9 9.8    Studies/Results: Dg Chest Port 1 View  09/03/2011  *RADIOLOGY REPORT*  Clinical Data: Hypoxemia  PORTABLE CHEST - 1 VIEW  Comparison:   the previous day's study  Findings: Endotracheal tube, nasogastric tube, right arm PICC, and VP shunt catheter are again noted.  Low lung volumes with some patchy bibasilar subsegmental atelectasis.  Heart size upper limits normal.  No effusion.  IMPRESSION:  1.  Stable appearance since previous day's exam   Original Report Authenticated By: Osa Craver, M.D.     Assessment/Plan: Marked improvement following shunt.  No longer requiring ICU level of care.  OK to transfer to floor.  Will benefit from Rehab.    LOS: 20 days    Dorian Heckle, MD 09/05/2011, 6:38 AM

## 2011-09-05 NOTE — Progress Notes (Signed)
Name: [name] MRN: [id number] DOB: 1950-03-08    LOS: 20  Referring Provider:  Venetia Maxon (nsgy) Reason for Referral:  ICU management   PULMONARY / CRITICAL CARE MEDICINE  Brief Summary:   61 yo female admitted to [facility] 08/16/2011 with severe headache, and altered mental status from diffuse SAH.  Pt was intubated in ER for airway protection and aspiration PNA.  She was initially felt to have poor prognosis, and plan was for comfort care.  However, she showed neurologic improvement, and code status reversed. She was then transferred to [facility] on 8/15 for further therapy.  She has been tx with HHH therapy, IVC drain, and aneurysm coiling.  She required VP shunt 8/30. PMHx HTN.  Events Since Admit: 8/13 Admit [facility] 8/15 Transfer to [facility] 8/15 IVC drain inserted 8/16 Coiling of approx 7mm x 4mm ACom aneurysm. 8/29 Ventriculomegaly on CT head 8/30 VP shunt insertion  Current status: Sitting in chair.  Denies chest pain, abdominal pain.  Vital Signs: Temp:  [97.5 F (36.4 C)-98.4 F (36.9 C)] 98 F (36.7 C) (09/02 0400) Pulse Rate:  [73-94] 77  (09/02 0700) Resp:  [14-23] 23  (09/02 0700) BP: (92-148)/(46-86) 120/46 mmHg (09/02 0700) SpO2:  [96 %-100 %] 100 % (09/02 0700) Weight:  [209 lb 14.1 oz (95.2 kg)] 209 lb 14.1 oz (95.2 kg) (09/02 0500)  Intake/Output Summary (Last 24 hours) at 09/05/11 0814 Last data filed at 09/05/11 0700  Gross per 24 hour  Intake   1925 ml  Output   2010 ml  Net    -85 ml    Physical Examination: Gen: No distress HEENT: NG in place PULM: no wheeze/rales CV: s1s2 regular AB: soft, non tender Ext: 1+ edema Neuro: alert, follows simple commands, moves extremities  No results found.   ASSESSMENT AND PLAN  PULMONARY  ETT:   8/13>>8/14 8/16 >>>8/24 8/30>>8/31  A: Acute respiratory failure after VP shunt insertion>>resolved. P:   Titrate oxygen to keep SpO2 > 92% Bronchial hygiene No hx of smoking/obstructive lung disease>>changed BD's to  prn F/u CXR as needed  CARDIOVASCULAR  Lines:   8/16  L Fem Sheath>>>8/19 8/16  R PICC >>>  A:  HTN. P:  Continue hydralazine, labetalol, nimodipine  RENAL  Lab 09/05/11 0440 09/04/11 0519 09/03/11 0430 08/30/11 0510  NA 153* 154* 148* 145  K 3.9 3.2* -- --  CL 120* 120* 115* 110  CO2 23 21 23 24   BUN 40* 42* 41* 27*  CREATININE 0.77 0.81 0.84 0.74  CALCIUM 9.9 9.8 9.8 9.6  MG -- -- 2.1 --  PHOS -- -- 4.7* --   Foley:  8/13>>>  A: Hypernatremia. Hypokalemia. P:   Increase free water 9/02 Replace K  GASTROINTESTINAL  A: Dysphagia Nutrition. P:   For FEES with speech 9/02 Continue tube feeds  HEMATOLOGIC  Lab 09/05/11 0440 09/03/11 0430 08/30/11 0510  HGB 9.8* 9.5* 10.2*  HCT 31.5* 31.2* 32.1*  PLT 251 277 343  INR -- 1.21 --  APTT -- -- --   A: Anemia. P:  F/u CBC intermittently  INFECTIOUS  Lab 09/05/11 0440 09/03/11 0430 08/30/11 0510  WBC 8.4 9.2 13.6*  PROCALCITON -- -- --   Cultures: 8/17 blood >>neg 8/17 urine >>neg 8/20 resp c/s>>neg 8/21 CDiff>>>neg  Antibiotics: Unasyn 8/13 >>>8/15 Cefazolin 8/15 >> 8/18 Vanc 8/18(asp pna) >>8/21 Zosyn 8/18 (asp pna)>>8/22  A: Aspiration pneumonia>>resolved. P:   Monitor off abx  ENDOCRINE  Lab 09/05/11 0357 09/04/11 2339 09/04/11 2009 09/04/11  1553 09/04/11 1230  GLUCAP 142* 146* 150* 141* 149*    A:  Hyperglycemia. P:   SSI/CBG Off lantus when off TF  NEUROLOGIC 8/16 IVC Drain>>>8/25  A: SAH s/p coiling to Acom with acute encephalopathy>>vasospasm improved.   Ventriculomegaly>>VP shunt 8/30>>mental status improved after. Rt internal capsule and Rt anterior cortex ischemic stroke. P:   Per neurosurgery Keppra for seizure prophylaxis PT  BEST PRACTICE / DISPOSITION Level of Care:  ICU Primary Service:  Neurosurgery Consultants:  Neurology, PCCM, speech rx Code Status:  Full Diet:  TF  DVT Px: SCD's GI Px:  Protonix Skin Integrity:  Intact Social / Family: No family at  bedside.  Coralyn Helling, MD [facility] Pulmonary/Critical Care 09/05/2011, 8:14 AM Pager:  [phone] After 3pm call: [phone]

## 2011-09-05 NOTE — Progress Notes (Signed)
Clinical Social Work: Coverage for 3100  Patient has been moved out of 3100 to 4N due to medical clerance and improvement. Patient overall plan since being in hospital for long period of time is rehab. Per Pt/OT and Speech notes all recommending CIR. Patient is medicaid pending which could be a barrier to CIR or if a bed is available, thus CSW has updated clinicals, FL2 and faxed patient out to SNF for review.  Due to holiday, limited number of responses and offers, thus left report with unit CSW to follow up with patient regarding options at DC.  Will remain on case and continue to follow.  Ashley Jacobs, MSW LCSW [phone]

## 2011-09-05 NOTE — Progress Notes (Signed)
Physical Therapy Treatment Patient Details Name: [name] MRN: [id number] DOB: 10-02-1950 Today's Date: 09/05/2011 Time: 4098-1191 PT Time Calculation (min): 25 min  PT Assessment / Plan / Recommendation Comments on Treatment Session  Pt able to increase tolearance to standing and initiate pregait exercises.  Pt continues to have flat affect and can be distracted easily.  Updated d/c plans to CIR.  Pt would be a great candidate and willing to participate with therapy.     Follow Up Recommendations  Inpatient Rehab    Barriers to Discharge        Equipment Recommendations  Defer to next venue    Recommendations for Other Services    Frequency Min 3X/week   Plan Discharge plan remains appropriate    Precautions / Restrictions Precautions Precautions: Fall Precaution Comments: multiple lines and leads including rectal pouch Restrictions Weight Bearing Restrictions: No   Pertinent Vitals/Pain No c/o pain    Mobility  Transfers Transfers: Sit to Stand;Stand to Sit Sit to Stand: 1: +2 Total assist;With upper extremity assist;From chair/3-in-1 Sit to Stand: Patient Percentage: 40% Stand to Sit: 1: +2 Total assist;With upper extremity assist;To chair/3-in-1 Stand to Sit: Patient Percentage: 30% Details for Transfer Assistance: pt unable to shift weight and lift bil LE. Pt required tactile and physical (A) from therapist    Exercises     PT Diagnosis:    PT Problem List:   PT Treatment Interventions:     PT Goals Acute Rehab PT Goals PT Goal Formulation: With patient/family Time For Goal Achievement: 09/13/11 Potential to Achieve Goals: Fair Pt will go Sit to Stand: with mod assist PT Goal: Sit to Stand - Progress: Progressing toward goal Additional Goals Additional Goal #1: pt will participate in pregait activity at EOB with mod A PT Goal: Additional Goal #1 - Progress: Progressing toward goal  Visit Information  Last PT Received On: 09/05/11 Assistance  Needed: +2    Subjective Data      Cognition  Overall Cognitive Status: Impaired Area of Impairment: Following commands;Attention;Awareness of errors Arousal/Alertness: Awake/alert Orientation Level: Disoriented X4 Behavior During Session: Flat affect Current Attention Level: Focused Following Commands: Follows one step commands inconsistently Awareness of Errors: Assistance required to identify errors made;Assistance required to correct errors made    Balance  Balance Balance Assessed: Yes Static Standing Balance Static Standing - Balance Support: Bilateral upper extremity supported Static Standing - Level of Assistance: 1: +2 Total assist;Patient percentage (comment) (50%) Static Standing - Comment/# of Minutes: ~ 3 minutes x 2; Max tactile and manual cues to promote midline and prevent right lean.  Pt with bilateral LE knee buckling as well as hyperextension in standing.  Pt uses recliner to assist with maintaining balance  End of Session PT - End of Session Equipment Utilized During Treatment: Gait belt Activity Tolerance: Patient tolerated treatment well Patient left: in chair;with call bell/phone within reach Nurse Communication: Mobility status   GP     Audon Heymann 09/05/2011, 1:03 PM Jake Shark, PT DPT [phone]

## 2011-09-06 ENCOUNTER — Inpatient Hospital Stay (HOSPITAL_COMMUNITY): Payer: Medicaid Other

## 2011-09-06 ENCOUNTER — Encounter (HOSPITAL_COMMUNITY): Payer: Self-pay | Admitting: Neurosurgery

## 2011-09-06 DIAGNOSIS — I6330 Cerebral infarction due to thrombosis of unspecified cerebral artery: Secondary | ICD-10-CM

## 2011-09-06 DIAGNOSIS — I609 Nontraumatic subarachnoid hemorrhage, unspecified: Secondary | ICD-10-CM

## 2011-09-06 LAB — BASIC METABOLIC PANEL
Calcium: 9.7 mg/dL (ref 8.4–10.5)
Chloride: 119 mEq/L — ABNORMAL HIGH (ref 96–112)
Creatinine, Ser: 0.9 mg/dL (ref 0.50–1.10)
GFR calc Af Amer: 77 mL/min — ABNORMAL LOW (ref >90)
Sodium: 153 mEq/L — ABNORMAL HIGH (ref 135–145)

## 2011-09-06 LAB — GLUCOSE, CAPILLARY
Glucose-Capillary: 158 mg/dL — ABNORMAL HIGH (ref 70–99)
Glucose-Capillary: 121 mg/dL — ABNORMAL HIGH (ref 70–99)
Glucose-Capillary: 111 mg/dL — ABNORMAL HIGH (ref 70–99)
Glucose-Capillary: 161 mg/dL — ABNORMAL HIGH (ref 70–99)

## 2011-09-06 MED ORDER — NIMODIPINE 30 MG PO CAPS
60.0000 mg | ORAL_CAPSULE | ORAL | Status: DC
  Administered 2011-09-06 – 2011-09-08 (×11): 60 mg via ORAL
  Filled 2011-09-06 (×16): qty 2

## 2011-09-06 MED ORDER — SODIUM CHLORIDE 0.9 % IJ SOLN: 10.0000 mL | Freq: Two times a day (BID) | INTRAMUSCULAR | Status: DC

## 2011-09-06 MED ORDER — LABETALOL HCL 200 MG PO TABS
200.0000 mg | ORAL_TABLET | Freq: Three times a day (TID) | ORAL | Status: DC
  Administered 2011-09-06 – 2011-09-08 (×5): 200 mg via ORAL
  Filled 2011-09-06 (×7): qty 1

## 2011-09-06 MED ORDER — STARCH (THICKENING) PO POWD
6.0000 g | ORAL | Status: DC | PRN
  Filled 2011-09-06 (×2): qty 227

## 2011-09-06 MED ORDER — LEVETIRACETAM 500 MG PO TABS
500.0000 mg | ORAL_TABLET | Freq: Two times a day (BID) | ORAL | Status: DC
  Administered 2011-09-06 – 2011-09-08 (×4): 500 mg via ORAL
  Filled 2011-09-06 (×5): qty 1

## 2011-09-06 MED ORDER — SODIUM CHLORIDE 0.9 % IJ SOLN
10.0000 mL | INTRAMUSCULAR | Status: DC | PRN
  Administered 2011-09-06: 20 mL
  Administered 2011-09-08 (×2): 10 mL

## 2011-09-06 MED ORDER — HYDRALAZINE HCL 10 MG PO TABS
20.0000 mg | ORAL_TABLET | Freq: Three times a day (TID) | ORAL | Status: DC
  Administered 2011-09-06 – 2011-09-08 (×6): 20 mg via ORAL
  Filled 2011-09-06 (×8): qty 2

## 2011-09-06 MED ORDER — ALTEPLASE 2 MG IJ SOLR
2.0000 mg | Freq: Once | INTRAMUSCULAR | Status: AC
  Administered 2011-09-06: 2 mg
  Filled 2011-09-06 (×2): qty 2

## 2011-09-06 NOTE — Progress Notes (Signed)
Physical Therapy Treatment Patient Details Name: [name] MRN: [id number] DOB: 15-Sep-1950 Today's Date: 09/06/2011 Time: 4098-1191 PT Time Calculation (min): 48 min  PT Assessment / Plan / Recommendation Comments on Treatment Session  Pt with impoved ability to weight shift laterally and anterior<->posterior with maximum verbal and tactile cueing. Pt's main limiting factor continues to be attention level and requiring constant redirection back to task at hand. Pt continuing to complain of right ankle pain with PROM and palpation. Nursing notified. Continuing to recommend CIR at discharge.    Follow Up Recommendations  Inpatient Rehab    Barriers to Discharge        Equipment Recommendations  Defer to next venue    Recommendations for Other Services Rehab consult  Frequency Min 3X/week   Plan Discharge plan remains appropriate;Frequency remains appropriate    Precautions / Restrictions Precautions Precautions: Fall Restrictions Weight Bearing Restrictions: No       Mobility  Bed Mobility Bed Mobility: Rolling Left;Left Sidelying to Sit;Sitting - Scoot to [place] of Bed;Sit to Supine Rolling Left: 1: +1 Total assist Rolling Left: Patient Percentage: 50% Left Sidelying to Sit: 1: +1 Total assist Left Sidelying to Sit: Patient Percentage: 30% Sitting - Scoot to Edge of Bed: 1: +2 Total assist Sitting - Scoot to Edge of Bed: Patient Percentage: 10% Sit to Supine: 1: +2 Total assist Sit to Supine: Patient Percentage: 20% Details for Bed Mobility Assistance: Pt requried verbal and tactile cueing for sequencing of tasks as well as constant redirection back to taks at hand. Pt required facilitation with bil LE as well as upper trunk with all bed mobility. Transfers Transfers: Sit to Stand;Stand to Sit Sit to Stand: 1: +2 Total assist Sit to Stand: Patient Percentage: 40% Stand to Sit: 1: +2 Total assist Stand to Sit: Patient Percentage: 40% Details for Transfer Assistance: Pt  able to preform weight shift in all directions with maximum verbal and tactile cueing at upper trunk and hips. Pt tended to rotate trunk to right, requiring max verbal and tactile cues for midline. Pt requires blocking of bil knees for buckling, although pt does have tendency to lock out bil knees during stance. Ambulation/Gait Ambulation/Gait Assistance: Not tested (comment)      PT Goals Acute Rehab PT Goals PT Goal: Sit to Supine/Side - Progress: Progressing toward goal PT Goal: Sit to Stand - Progress: Progressing toward goal Pt will go Stand to Sit: with mod assist PT Goal: Stand to Sit - Progress: Goal set today Additional Goals PT Goal: Additional Goal #1 - Progress: Progressing toward goal  Visit Information  Last PT Received On: 09/06/11 Assistance Needed: +2    Subjective Data  Subjective: Pt responds to all questions with nodding or shaking head, verbalizing "ouch" once during right ankle palpation.   Cognition  Overall Cognitive Status: Impaired Area of Impairment: Attention;Following commands;Awareness of errors;Awareness of deficits;Safety/judgement;Problem solving Difficult to assess due to: Impaired communication Arousal/Alertness: Awake/alert Orientation Level: Disoriented X4 Behavior During Session: Flat affect Current Attention Level: Focused Following Commands: Follows one step commands inconsistently Safety/Judgement: Impulsive;Decreased safety judgement for tasks assessed;Decreased awareness of need for assistance Awareness of Errors: Assistance required to identify errors made;Assistance required to correct errors made Cognition - Other Comments: Responds to all questions with nodding or shaking of head-not always an accurate response to question asked.    Balance  Balance Balance Assessed: Yes Static Sitting Balance Static Sitting - Balance Support: Bilateral upper extremity supported;Feet supported Static Sitting - Level of Assistance: 3:  Mod assist;4:  Min assist Static Sitting - Comment/# of Minutes: Pt able to use bil UE to assist with sitting EOB but requried min to mod assist for corrections of LOB, which tended to be posterior and to the right. Static Standing Balance Static Standing - Balance Support: Bilateral upper extremity supported Static Standing - Level of Assistance: 1: +2 Total assist (60%) Static Standing - Comment/# of Minutes: Pt stood 2 minutes x1 and 3 minutes with maximum verbal and tactile cues for midline posture (preventing right rotation of trunk) at upper trunk and pelvis and blocking at bil knees. Pt also tends to hyperextend knees when they are not buckling.  End of Session PT - End of Session Equipment Utilized During Treatment: Gait belt Activity Tolerance: Patient tolerated treatment well Patient left: in bed;with call bell/phone within reach (with nurse tech) Nurse Communication: Mobility status   GP     Kelleen Stolze 09/06/2011, 4:24 PM

## 2011-09-06 NOTE — Progress Notes (Signed)
I contacted Dr. Venetia Maxon in OR earlier today to request an  inpt rehab/CIR consult per therapy recommendations. Was told that consult would be ordered, order remains pending. Please order so that we can assess if pt would be a candidate. Call me for any questions. [phone]

## 2011-09-06 NOTE — Progress Notes (Signed)
Opens eyes to voice, mumbles "Uh huh" when asks if doing better.  Follows commands with BUE and LLE, but winces with passive ROM RLE.  No RLE swelling or skin breakdown. Follows nurse around room with eyes, nodding & shrugging to questions, but doe not speak more than "uh huh" this morning.  Incision without erythema, swelling, or drainage. Will continue supportive care.  Georgiann Cocker, RN, BSN

## 2011-09-06 NOTE — Progress Notes (Signed)
Continue therapies and mobilize. To assess swallowing this am.

## 2011-09-06 NOTE — Progress Notes (Signed)
I have read and agree with the below treatment note and plan. Daymeon Fischman Helen Whitlow PT, DPT Pager: [phone] 

## 2011-09-06 NOTE — Procedures (Signed)
Objective Swallowing Evaluation: Modified Barium Swallowing Study  Patient Details  Name: [name] MRN: [id number] Date of Birth: 07-Feb-1950  Today's Date: 09/06/2011 Time: 1200-1235 SLP Time Calculation (min): 35 min  Past Medical History:  Past Medical History  Diagnosis Date  . Hypertension    Past Surgical History:  Past Surgical History  Procedure Date  . Abdominal hysterectomy   . Ventriculoperitoneal shunt 09/02/2011    Procedure: SHUNT INSERTION VENTRICULAR-PERITONEAL;  Surgeon: Maeola Harman, MD;  Location: [facility];  Service: Neurosurgery;  Laterality: Right;   HPI:  61 y/o female admitted to [facility] on 8/13 with headache and acute encephalopathy. Patient with subarachnoidhemorrhage and aspiration PNA,  Patient intubated but extubated for transition to comfort care due to extent of hemorrhage. Patient transferred to [facility] for Neurological intervention. due to improvement .  Postop VDRF on 8/16. Intubated 08/19/11 to 08/27/11.  BSE completed on 08/27/11 indicated dysphagia with recommendations of NPO .  Patient  d/c'd from ST treatment due to lack of progress. Patient s/p VP shunt 8/30 with improved LOA.  Patient referred to reassess swallow .       Assessment / Plan / Recommendation Clinical Impression  Dysphagia Diagnosis: Moderate oral phase dysphagia;Mild pharyngeal phase dysphagia;Mild cervical esophageal phase dysphagia Clinical impression: Pt. presents with moderate oral motor phase dysphagia, mild sensory pharyngeal phase dysphagia, and mild cerivcal esophageal phase dysphagia.  Oral phase characterized by decreased bolus formation, decreased lingual coordination and control resulting in premature posterior escape of with all consistencies, bolus holding, and mild posterior base of tongue residue.  Pharyngeal phase impairments include delayed swallow initiation to valleculae and pyriform sinuses and min-mild vallecular residue due to decreased tongue base retraction. As study  progressed observed mild coating of barium on anterior wall of laryngeal vestibule with thin as she penetrated likely after the swallow from residue with intermittent coughs.  Pt. able to initiate multiple swallows at the onset of the study to clear pharyngeal residue however, unable to do so by end of study as increased fatigue and decreased attention was evident. Reduced cricopharyngeal relaxation /tight UES led to backflow into pyriform sinuses.  Pt.'s cognitive deficits place her at higher risk for decreased nutritional intake.  Pt. must have full supervision and assist to follow swallow precautions.  SLP recommend Dys. 1 diet with nectar thick liquids. Will f/u to determine safety and efficiency with diet.     Treatment Recommendation  Therapy as outlined in treatment plan below    Diet Recommendation Dysphagia 1 (Puree);Nectar-thick liquid   Liquid Administration via: Cup;No straw Medication Administration: Whole meds with puree Supervision: Full supervision/cueing for compensatory strategies;Patient able to self feed Compensations: Slow rate;Small sips/bites;Multiple dry swallows after each bite/sip;Check for pocketing;Clear throat intermittently Postural Changes and/or Swallow Maneuvers: Seated upright 90 degrees;Upright 30-60 min after meal    Other  Recommendations Oral Care Recommendations: Oral care QID Other Recommendations: Order thickener from pharmacy   Follow Up Recommendations  Inpatient Rehab    Frequency and Duration min 2x/week  2 weeks       SLP Swallow Goals Patient will consume recommended diet without observed clinical signs of aspiration with: Moderate cueing Patient will utilize recommended strategies during swallow to increase swallowing safety with: Moderate cueing      Reason for Referral Objectively evaluate swallowing function   Oral Phase     Pharyngeal Phase Pharyngeal Phase: Impaired   Cervical Esophageal Phase    GO    Cervical Esophageal  Phase: Impaired  Royce Macadamia 09/06/2011, 4:37 PM

## 2011-09-07 ENCOUNTER — Inpatient Hospital Stay (HOSPITAL_COMMUNITY): Payer: Medicaid Other

## 2011-09-07 LAB — GLUCOSE, CAPILLARY
Glucose-Capillary: 131 mg/dL — ABNORMAL HIGH (ref 70–99)
Glucose-Capillary: 175 mg/dL — ABNORMAL HIGH (ref 70–99)
Glucose-Capillary: 82 mg/dL (ref 70–99)

## 2011-09-07 MED ORDER — INSULIN ASPART 100 UNIT/ML ~~LOC~~ SOLN
0.0000 [IU] | Freq: Three times a day (TID) | SUBCUTANEOUS | Status: DC
  Administered 2011-09-08: 5 [IU] via SUBCUTANEOUS

## 2011-09-07 NOTE — Clinical Social Work Note (Signed)
CSW met with pt and family to provide bed offers. Pt has a bed offer from [place] with a Letter of Guarantee. CSW explained process of placement with a Letter of Guarantee. Pt's family is agreeable, however they prefer a facility in [facility]. CSW expanded search to [facility]. CSW will follow up with bed offers. CSW will continue to follow.   Dede Query, MSW, Theresia Majors [phone]

## 2011-09-07 NOTE — Progress Notes (Signed)
Occupational Therapy Treatment Patient Details Name: [name] MRN: [id number] DOB: Dec 11, 1950 Today's Date: 09/07/2011 Time: 8295-6213 OT Time Calculation (min): 45 min  OT Assessment / Plan / Recommendation Comments on Treatment Session Pt. with significant improvements today.  Pt. maintained arrousal for full 45 mins and was engaged and participating.  Pt. able to sustain attention x 2-3 mins at a time.  Pt. able to state husband and dtrs name, and vocalized x 3 - single words.  pt. followed commands consistently.    Follow Up Recommendations  Inpatient Rehab    Barriers to Discharge       Equipment Recommendations  Defer to next venue    Recommendations for Other Services    Frequency Min 2X/week   Plan Discharge plan remains appropriate    Precautions / Restrictions Precautions Precautions: Fall Restrictions Weight Bearing Restrictions: No   Pertinent Vitals/Pain     ADL  Grooming: Performed;Wash/dry hands;Min guard (apply lotion to hands) Where Assessed - Grooming: Unsupported sitting ADL Comments: Pt. moved to EOB with mod A and increased time. Pt. maintained EOB x 35 mins with min guard assist.  Pt. at times rocking posteriorly and anteriorly, but does not lose balance.  Pt. demonstrates ~80% AROM Rt. UE and Lt. UE AROM WFL.  Pt. following one step commands consistently.  Pt. able to state dtr's, and husband's name when asked.  She states she is fine, and stated "no" when asked if she needed anything, or if she were in pain.  Pt. requires increased time to respond, or engage in activity.   Pt. indicated fatigue and moved to supine position with min A and increased time.       OT Diagnosis:    OT Problem List:   OT Treatment Interventions:     OT Goals ADL Goals ADL Goal: Grooming - Progress: Progressing toward goals Miscellaneous OT Goals OT Goal: Miscellaneous Goal #1 - Progress: Progressing toward goals OT Goal: Miscellaneous Goal #2 - Progress: Progressing  toward goals  Visit Information  Last OT Received On: 09/07/11 Assistance Needed: +2 (for OOB)    Subjective Data      Prior Functioning       Cognition  Overall Cognitive Status: Impaired Area of Impairment: Attention;Following commands;Awareness of errors;Awareness of deficits;Safety/judgement;Problem solving Difficult to assess due to: Impaired communication Arousal/Alertness: Awake/alert Behavior During Session: [facility] for tasks performed Current Attention Level: Sustained Attention - Other Comments: 2-3 mins Following Commands: Follows one step commands consistently;Follows one step commands with increased time Problem Solving: min A for familiar tasks    Mobility  Shoulder Instructions Bed Mobility Bed Mobility: Rolling Left;Left Sidelying to Sit;Sitting - Scoot to [place] of Bed;Sit to Supine;Scooting to [facility] Rolling Left: 3: Mod assist Left Sidelying to Sit: 3: Mod assist;With rails Sitting - Scoot to Edge of Bed: 4: Min assist Sit to Supine: 4: Min assist;With rail;HOB flat Scooting to HOB: 4: Min assist;With rail (mod instructional cues) Details for Bed Mobility Assistance: Pt. requires increased time to perform movement, and requires cues at shoulder hip       Exercises      Balance Balance Balance Assessed: Yes Static Sitting Balance Static Sitting - Balance Support: Bilateral upper extremity supported;Feet supported Static Sitting - Level of Assistance: 5: Stand by assistance Static Sitting - Comment/# of Minutes: 15 Dynamic Sitting Balance Dynamic Sitting - Balance Support: No upper extremity supported;Left upper extremity supported Dynamic Sitting - Level of Assistance: 5: Stand by assistance (to min guard assist) Dynamic  Sitting Balance - Compensations: Pt. rocking back and forth at times Dynamic Sitting - Balance Activities:  (grooming)   End of Session OT - End of Session Activity Tolerance: Patient tolerated treatment well Patient left: in bed;with  call bell/phone within reach;with nursing in room;with family/visitor present  GO     Isabele Lollar, [name] 09/07/2011, 4:46 PM

## 2011-09-07 NOTE — Progress Notes (Signed)
Nutrition Follow-up  Intervention:   Magic cup TID between meals, each supplement provides 290 kcal and 9 grams of protein. Encouraged intake with pt and family  Assessment:   Pt passed swallow evaluation and started on po diet 09/06/11. Pt no longer has nasoenteric feeding tube present. Per family/RN pt is only eating a few bites. For Breakfast pt only ate 50% of her applesauce (1/4 cup). Pt is alert. I encouraged better intake/importance of adequate nutrition now that she does not have her feeding tube.  Diet Order:  Dysphagia I/Nectar thick liqiuds  Meds: Scheduled Meds:   . antiseptic oral rinse  15 mL Mouth Rinse q12n4p  . chlorhexidine  15 mL Mouth Rinse BID  . feeding supplement  30 mL Per Tube QID  . hydrALAZINE  20 mg Oral Q8H  . insulin aspart  0-15 Units Subcutaneous Q4H  . insulin glargine  10 Units Subcutaneous QHS  . labetalol  200 mg Oral TID  . levETIRAcetam  500 mg Oral BID  . niMODipine  60 mg Oral Q4H  . pantoprazole sodium  40 mg Per Tube Q1200  . sodium chloride  10-40 mL Intracatheter Q12H  . DISCONTD: free water  200 mL Per Tube Q4H  . DISCONTD: hydrALAZINE  20 mg Per Tube Q8H  . DISCONTD: labetalol  200 mg Per Tube TID  . DISCONTD: levETIRAcetam  500 mg Per Tube BID  . DISCONTD: NiMODipine  60 mg Oral Q4H   Continuous Infusions:   . DISCONTD: feeding supplement (JEVITY 1.2 CAL) 1,000 mL (09/04/11 1730)   PRN Meds:.albuterol, fentaNYL, food thickener, HYDROcodone-acetaminophen, labetalol, ondansetron (ZOFRAN) IV, sodium chloride  Labs:  CMP     Component Value Date/Time   NA 153* 09/06/2011 0800   K 3.8 09/06/2011 0800   CL 119* 09/06/2011 0800   CO2 24 09/06/2011 0800   GLUCOSE 169* 09/06/2011 0800   BUN 44* 09/06/2011 0800   CREATININE 0.90 09/06/2011 0800   CALCIUM 9.7 09/06/2011 0800   PROT 7.9 08/18/2011 1336   ALBUMIN 3.0* 08/18/2011 1336   AST 28 08/18/2011 1336   ALT 16 08/18/2011 1336   ALKPHOS 75 08/18/2011 1336   BILITOT 0.6 08/18/2011 1336   GFRNONAA  67* 09/06/2011 0800   GFRAA 77* 09/06/2011 0800   CBG (last 3)   Basename 09/07/11 1134 09/07/11 0822 09/07/11 0440  GLUCAP 131* 102* 114*   No intake or output data in the 24 hours ending 09/07/11 1443  Weight Status:  217 lbs  217 lbs 9/4 218 lbs 9/3 209 lbs 9/2  Re-estimated needs:  1600-1800 kcal, 115-130 grams protein  Nutrition Dx: Inadequate oral intake now r/t decreased appetite AEB meal completion <25%. Ongoing.   Goal: Enteral nutrition to provide at least 90% of estimated energy needs; no longer applicable  New Goal: Pt to meet >/= 90% of their estimated nutrition needs.  Monitor: PO intake, supplement acceptance, weights, labs, I/O's  Kendell Bane RD, LDN, CNSC  [phone] Pager  [phone] After Hours Pager

## 2011-09-07 NOTE — Progress Notes (Signed)
Awake and alert.  Abdomen soft, wounds clean and dry, no tenderness.

## 2011-09-07 NOTE — Progress Notes (Signed)
I met with patient and her spouse at bedside. Pt alert but nonverbal with me. Follow commands for upper extremities, moves toes to BLE. 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 He feels lethargy noted by some may be related to her sleep cycle and also the aneurysm issues. I recommend an inpt rehab admission for intense therapies and see how she progresses . Spouse in agreement. I will follow up in the morning to clarify when physician feels medically ready to admit. ? Thursday or Friday. Please call me with questions. [phone]. I have updated RN CM with plan.

## 2011-09-07 NOTE — Consult Note (Signed)
Physical Medicine and Rehabilitation Consult Reason for Consult: [facility] and hydrocephalus Referring Physician: Dr. Venetia Maxon   HPI: [name] is a 61 y.o. African American right-handed female with history of hypertension. Admitted 08/18/2011 with severe onset of headaches and became unresponsive with frothing of the mouth. In the emergency department CT scan imaging of the head showed diffuse subarachnoid hemorrhage as well as small infarct left internal capsule. chest x-ray showing aspiration pneumonia. She was subsequently intubated and later extubated after several hours. Underwent right frontal IVC placement 08/18/2011 per Dr. Venetia Maxon. 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 Underwent coiling of aneurysm 08/19/2011 per Dr. Corliss Skains. Hospital course with VP shunt placed 09/02/2011 due to enlarging ventricles and elevated intracranial pressure. Maintained on Keppra for seizure prophylaxis. She continues on nimotop for blood pressure control. Followup speech therapy with modified barium swallow maintained on dysphagia 1 nectar thick liquid diet. Progress has been slow with physical and occupational therapy ongoing and recommendations of physical medicine rehabilitation consult to consider inpatient rehabilitation services.   Review of Systems  Unable to perform ROS  Past Medical History  Diagnosis Date  . Hypertension    Past Surgical History  Procedure Date  . Abdominal hysterectomy   . Ventriculoperitoneal shunt 09/02/2011    Procedure: SHUNT INSERTION VENTRICULAR-PERITONEAL;  Surgeon: Maeola Harman, MD;  Location: [facility];  Service: Neurosurgery;  Laterality: Right;   History reviewed. No pertinent family history. Social History:  reports that she has never smoked. She does not have any smokeless tobacco history on file. She reports that she does not drink alcohol or use illicit drugs. Allergies:  Allergies  Allergen Reactions  . Lactose Intolerance (Gi)  Other (See Comments)    G.I. Upset   Medications Prior to Admission  Medication Sig Dispense Refill  . cloNIDine (CATAPRES) 0.1 MG tablet Take 0.1 mg by mouth 2 (two) times daily.      . hydrochlorothiazide (HYDRODIURIL) 25 MG tablet Take 1 tablet (25 mg total) by mouth daily.  20 tablet  0  . ibuprofen (ADVIL,MOTRIN) 800 MG tablet Take 800 mg by mouth every 8 (eight) hours as needed. Pain      . DISCONTD: methylcellulose (ARTIFICIAL TEARS) 1 % ophthalmic solution Place 1 drop into both eyes as needed. Dry Eyes        Home: Home Living Lives With: Spouse Available Help at Discharge: Family Type of Home: House Home Layout: One level Bathroom Shower/Tub: Tub/shower unit;Door [employer] Toilet: Standard Bathroom Accessibility: Yes How Accessible: Accessible via wheelchair;Accessible via walker Home Adaptive Equipment: None  Functional History: Prior Function Able to Take Stairs?: Yes Driving: Yes Vocation: On disability Comments: worked at [employer] Functional Status:  Mobility: Bed Mobility Bed Mobility: Rolling Left;Left Sidelying to Sit;Sitting - Scoot to [place] of Bed;Sit to Supine Rolling Left: 1: +1 Total assist Rolling Left: Patient Percentage: 50% Left Sidelying to Sit: 1: +1 Total assist Left Sidelying to Sit: Patient Percentage: 30% Sitting - Scoot to Edge of Bed: 1: +2 Total assist Sitting - Scoot to Edge of Bed: Patient Percentage: 10% Sit to Supine: 1: +2 Total assist Sit to Supine: Patient Percentage: 20% Transfers Transfers: Sit to Stand;Stand to Sit Sit to Stand: 1: +2 Total assist Sit to Stand: Patient Percentage: 40% Stand to Sit: 1: +2 Total assist Stand to Sit: Patient Percentage: 40% Ambulation/Gait Ambulation/Gait Assistance: Not tested (comment) Stairs: No Wheelchair Mobility Wheelchair Mobility: No  ADL: ADL Toilet Transfer: Chief of Staff Method: Sit to stand  Toilet Transfer Equipment: Raised toilet seat  with arms (or 3-in-1 over toilet) Equipment Used: Gait belt Transfers/Ambulation Related to ADLs: pt sit<>stand only from chair. pt demonstrates hyperextension bil LE. Pt shifting weight at times however unable to take a step or lift LE off floor. pt bracing bil LE against chair for support. Pt requires blocking BIL LE with support of chair removed and hyper extension prevented ADL Comments: Pt in chair on arrival in [place] pad. Pt agreeable to sit<>stand from chair. Pt provided straight back chair in front of patient as a support and surface to place BIL UE. Pt with leaking from rectal pouch noted. Pt static standing for >3 minutes. Pt required hand over hand placement on back of chair. Pt holding chair with Lt hand and not with Rt hand. Rt UE weakness noted.   Cognition: Cognition Arousal/Alertness: Awake/alert Orientation Level: Oriented X4 Cognition Overall Cognitive Status: Impaired Area of Impairment: Attention;Following commands;Awareness of errors;Awareness of deficits;Safety/judgement;Problem solving Difficult to assess due to: Impaired communication Arousal/Alertness: Awake/alert Orientation Level: Disoriented X4 Behavior During Session: Flat affect Current Attention Level: Focused Following Commands: Follows one step commands inconsistently Safety/Judgement: Impulsive;Decreased safety judgement for tasks assessed;Decreased awareness of need for assistance Awareness of Errors: Assistance required to identify errors made;Assistance required to correct errors made Cognition - Other Comments: Responds to all questions with nodding or shaking of head-not always an accurate response to question asked.  Blood pressure 180/95, pulse 82, temperature 98 F (36.7 C), temperature source Oral, resp. rate 20, height 5\' 6"  (1.676 m), weight 98.7 kg (217 lb 9.5 oz), SpO2 99.00%. Physical Exam  HENT:       Staples in place to VP shunt site  Eyes:       Pupils are sluggish to light  Neck: No  thyromegaly present.  Cardiovascular: Normal rate and regular rhythm.   Pulmonary/Chest: Breath sounds normal. No respiratory distress. She has no wheezes.  Abdominal: Bowel sounds are normal. She exhibits no distension. There is no tenderness.  Neurological:       Patient with flat affect and easily distracted. She was nonverbal during exam. Patient was difficult to keep awake. She did not follow commands.  Upper extremities have 4 minus grip arm flexion extension. Patient does manual muscle testing with eyes closed Right lower extremity no active movement although once again not clear whether or not she is fully aware of commands Left lower extremity has 2 minus hip knee extensor synergy Cannot assess sensation  Results for orders placed during the hospital encounter of 08/16/11 (from the past 24 hour(s))  BASIC METABOLIC PANEL     Status: Abnormal   Collection Time   09/06/11  8:00 AM      Component Value Range   Sodium 153 (*) 135 - 145 mEq/L   Potassium 3.8  3.5 - 5.1 mEq/L   Chloride 119 (*) 96 - 112 mEq/L   CO2 24  19 - 32 mEq/L   Glucose, Bld 169 (*) 70 - 99 mg/dL   BUN 44 (*) 6 - 23 mg/dL   Creatinine, Ser 1.61  0.50 - 1.10 mg/dL   Calcium 9.7  8.4 - 09.6 mg/dL   GFR calc non Af Amer 67 (*) >90 mL/min   GFR calc Af Amer 77 (*) >90 mL/min  GLUCOSE, CAPILLARY     Status: Abnormal   Collection Time   09/06/11  8:16 AM      Component Value Range   Glucose-Capillary 158 (*) 70 - 99 mg/dL  Comment 1 Documented in Chart     Comment 2 Notify RN    GLUCOSE, CAPILLARY     Status: Abnormal   Collection Time   09/06/11 12:57 PM      Component Value Range   Glucose-Capillary 121 (*) 70 - 99 mg/dL   Comment 1 Documented in Chart     Comment 2 Notify RN    GLUCOSE, CAPILLARY     Status: Abnormal   Collection Time   09/06/11  4:58 PM      Component Value Range   Glucose-Capillary 111 (*) 70 - 99 mg/dL   Comment 1 Documented in Chart     Comment 2 Notify RN    GLUCOSE, CAPILLARY      Status: Abnormal   Collection Time   09/06/11  8:09 PM      Component Value Range   Glucose-Capillary 161 (*) 70 - 99 mg/dL   Comment 1 Documented in Chart     Comment 2 Notify RN    GLUCOSE, CAPILLARY     Status: Abnormal   Collection Time   09/07/11 12:11 AM      Component Value Range   Glucose-Capillary 119 (*) 70 - 99 mg/dL   Comment 1 Documented in Chart     Comment 2 Notify RN    GLUCOSE, CAPILLARY     Status: Abnormal   Collection Time   09/07/11  4:40 AM      Component Value Range   Glucose-Capillary 114 (*) 70 - 99 mg/dL   Comment 1 Documented in Chart     Comment 2 Notify RN     Dg Swallowing Func-no Report  09/06/2011  CLINICAL DATA: dysphagia   FLUOROSCOPY FOR SWALLOWING FUNCTION STUDY:  Fluoroscopy was provided for swallowing function study, which was  administered by a speech pathologist.  Final results and recommendations  from this study are contained within the speech pathology report.      Assessment/Plan: Diagnosis: Subarachnoid hemorrhage with severe cognitive deficits with decreased arousal as well as weakness. 1. Does the need for close, 24 hr/day medical supervision in concert with the patient's rehab needs make it unreasonable for this patient to be served in a less intensive setting? Potentially 2. Co-Morbidities requiring supervision/potential complications: Hydrocephalus status post VP shunt, aspiration pneumonia, hypertension 3. Due to bladder management, bowel management, safety, skin/wound care, disease management, medication administration and patient education, does the patient require 24 hr/day rehab nursing? Yes 4. Does the patient require coordinated care of a physician, rehab nurse, PT (1-2 hrs/day, 5 days/week), OT (1-2 hrs/day, 5 days/week) and SLP (0.5-1 hrs/day, 5 days/week) to address physical and functional deficits in the context of the above medical diagnosis(es)? Potentially Addressing deficits in the following areas: balance, endurance,  locomotion, strength, transferring, bowel/bladder control, bathing, dressing, feeding, grooming, toileting, cognition, speech, language, swallowing and psychosocial support 5. Can the patient actively participate in an intensive therapy program of at least 3 hrs of therapy per day at least 5 days per week? Potentially 6. The potential for patient to make measurable gains while on inpatient rehab is good 7. Anticipated functional outcomes upon discharge from inpatient rehab are Min assist mobility with PT, Min assist ADLs with OT, Improve attention concentration and level of alertness participate in therapy and ADLs with SLP. 8. Estimated rehab length of stay to reach the above functional goals is: 3weeks 9. Does the patient have adequate social supports to accommodate these discharge functional goals? Potentially 10. Anticipated D/C setting: Home  11. Anticipated post D/C treatments: HH therapy 12. Overall Rehab/Functional Prognosis: good  RECOMMENDATIONS: This patient's condition is appropriate for continued rehabilitative care in the following setting: CIR Patient has agreed to participate in recommended program. Potentially Note that insurance prior authorization may be required for reimbursement for recommended care.  Comment:RN admission coordinator to monitor for therapy participation and endurance prior to CIR admission    09/07/2011

## 2011-09-07 NOTE — Progress Notes (Signed)
SLP has reviewed and agrees with student's note below.  Breck Coons [place].Ed [employer] [phone]  09/07/2011

## 2011-09-07 NOTE — Clinical Social Work Placement (Signed)
    Clinical Social Work Department CLINICAL SOCIAL WORK PLACEMENT NOTE 09/07/2011  Patient:  [name], [name]  Account Number:  [id number] Admit date:  08/16/2011  Clinical Social Worker:  Peggyann Shoals  Date/time:  09/07/2011 04:15 PM  Clinical Social Work is seeking post-discharge placement for this patient at the following level of care:   SKILLED NURSING   (*CSW will update this form in Epic as items are completed)   09/05/2011  Patient/family provided with [facility] Department of Clinical Social Work's list of facilities offering this level of care within the geographic area requested by the patient (or if unable, by the patient's family).  09/05/2011  Patient/family informed of their freedom to choose among providers that offer the needed level of care, that participate in Medicare, Medicaid or managed care program needed by the patient, have an available bed and are willing to accept the patient.  09/05/2011  Patient/family informed of [facility]' ownership interest in [facility], as well as of the fact that they are under no obligation to receive care at this facility.  PASARR submitted to EDS on  PASARR number received from EDS on   FL2 transmitted to all facilities in geographic area requested by pt/family on  09/05/2011 FL2 transmitted to all facilities within larger geographic area on 09/07/2011  Patient informed that his/her managed care company has contracts with or will negotiate with  certain facilities, including the following:   Pt will require a letter of guarantee, pt's medicaid is pending.     Patient/family informed of bed offers received:  09/07/2011 Patient chooses bed at  Physician recommends and patient chooses bed at    Patient to be transferred to  on   Patient to be transferred to facility by   The following physician request were entered in Epic:   Additional Comments:  Pt discharged to [facility] on  09/08/2011.   Dede Query, MSW, Theresia Majors [phone]

## 2011-09-07 NOTE — Progress Notes (Signed)
Head CT today 

## 2011-09-07 NOTE — Progress Notes (Signed)
Speech Language Pathology Dysphagia Treatment Patient Details Name: [name] MRN: [id number] DOB: 01/14/1950 Today's Date: 09/07/2011 Time: 4098-1191 SLP Time Calculation (min): 27 min  Assessment / Plan / Recommendation Clinical Impression  Pt. seen for safe consumption of  Dys 1 diet during breakfast. She was more alert this morning than during MBS the previous day. Max verbal, visual, tactile cues provided to implement safe swallow strategies (reinforcement is needed). Husband reports decreased intake with dinner last night and that she vomited after dinner. Pt. confirmed with head gesture that she is no longer nauseous. However, Pt.'s face shows discomfort during oral phase (Pt. confirms w. head nod that it is b.c of taste). No s/s of aspiration present with nectar thick consistency. Immediate throat clear observed after 2x trials of puree ice cream indicative of possible penetration.  Pt. required max verbal/visual/tactile cues to perform second swallow, however, minimal laryngeal elevation present with palpation.  Husband reports intermittent coughing when resting without  po's.  She needs max encouragment to eat/drink due to decreased appetite versus decreased motivation. SLP will f/u to check on swallow safety with puree consistency and reinforce compensatory strategies.       Diet Recommendation  Continue with Current Diet: Dysphagia 1 (puree);Nectar-thick liquid    SLP Plan Continue with current plan of care      Swallowing Goals  SLP Swallowing Goals Patient will consume recommended diet without observed clinical signs of aspiration with: Moderate cueing Swallow Study Goal #1 - Progress: Progressing toward goal Patient will utilize recommended strategies during swallow to increase swallowing safety with: Moderate cueing Swallow Study Goal #2 - Progress: Progressing toward goal Goal #3: Patient will consume PO trials of various consistencies administered by SLP only with no  observed s/s of aspiration with mod verbal cues.   Swallow Study Goal #3 - Progress: Progressing toward goal  General Temperature Spikes Noted: No Respiratory Status: Room air Behavior/Cognition: Confused;Lethargic;Distractible;Requires cueing;Doesn't follow directions;Decreased sustained attention Oral Cavity - Dentition: Edentulous (bottom in ) Patient Positioning: Upright in bed  Oral Cavity - Oral Hygiene Does patient have any of the following "at risk" factors?: Tongue - coated Brush patient's teeth BID with toothbrush (using toothpaste with fluoride): Yes Patient is HIGH RISK - Oral Care Protocol followed (see row info): Yes Patient is AT RISK - Oral Care Protocol followed (see row info): Yes   Dysphagia Treatment Treatment focused on: Skilled observation of diet tolerance;Upgraded PO texture trials;Patient/family/caregiver education;Utilization of compensatory strategies Treatment Methods/Modalities: Skilled observation Patient observed directly with PO's: Yes Type of PO's observed: Dysphagia 1 (puree);Nectar-thick liquids Feeding: Needs assist Liquids provided via: Cup;No straw Oral Phase Signs & Symptoms:  (uncomfortable facial expression) Pharyngeal Phase Signs & Symptoms: Immediate throat clear Type of cueing: Verbal;Tactile;Visual Amount of cueing: Maximal   GO     Theotis Burrow 09/07/2011, 12:44 PM

## 2011-09-07 NOTE — Progress Notes (Signed)
Opens eyes to voice only briefly this am. Does not respond verbally this am and soon closes eyes, without maintaining focus. PEARL. Grasps gently when fingers are placed in her hands. Only command followed was tightening grip with hands when requested. No movement to command either LE.  Incisions clean, dry, without erythema, or swelling. Belly soft.  Georgiann Cocker, RN, BSN

## 2011-09-08 ENCOUNTER — Inpatient Hospital Stay (HOSPITAL_COMMUNITY)
Admission: RE | Admit: 2011-09-08 | Discharge: 2011-10-04 | DRG: 945 | Disposition: A | Discharge status: Skilled Nursing Facility | Payer: Medicaid Other | Source: Ambulatory Visit | Attending: Physical Medicine & Rehabilitation | Admitting: Physical Medicine & Rehabilitation

## 2011-09-08 ENCOUNTER — Inpatient Hospital Stay (HOSPITAL_COMMUNITY): Payer: Medicaid Other

## 2011-09-08 DIAGNOSIS — J690 Pneumonitis due to inhalation of food and vomit: Secondary | ICD-10-CM | POA: Diagnosis present

## 2011-09-08 DIAGNOSIS — I609 Nontraumatic subarachnoid hemorrhage, unspecified: Secondary | ICD-10-CM

## 2011-09-08 DIAGNOSIS — R1310 Dysphagia, unspecified: Secondary | ICD-10-CM

## 2011-09-08 DIAGNOSIS — R9431 Abnormal electrocardiogram [ECG] [EKG]: Secondary | ICD-10-CM | POA: Diagnosis not present

## 2011-09-08 DIAGNOSIS — R7309 Other abnormal glucose: Secondary | ICD-10-CM

## 2011-09-08 DIAGNOSIS — I10 Essential (primary) hypertension: Secondary | ICD-10-CM

## 2011-09-08 DIAGNOSIS — Z5189 Encounter for other specified aftercare: Secondary | ICD-10-CM

## 2011-09-08 DIAGNOSIS — M109 Gout, unspecified: Secondary | ICD-10-CM

## 2011-09-08 DIAGNOSIS — E119 Type 2 diabetes mellitus without complications: Secondary | ICD-10-CM | POA: Diagnosis not present

## 2011-09-08 DIAGNOSIS — Z79899 Other long term (current) drug therapy: Secondary | ICD-10-CM

## 2011-09-08 DIAGNOSIS — I6350 Cerebral infarction due to unspecified occlusion or stenosis of unspecified cerebral artery: Secondary | ICD-10-CM

## 2011-09-08 DIAGNOSIS — G40909 Epilepsy, unspecified, not intractable, without status epilepticus: Secondary | ICD-10-CM

## 2011-09-08 DIAGNOSIS — Z982 Presence of cerebrospinal fluid drainage device: Secondary | ICD-10-CM

## 2011-09-08 DIAGNOSIS — G911 Obstructive hydrocephalus: Secondary | ICD-10-CM

## 2011-09-08 DIAGNOSIS — E870 Hyperosmolality and hypernatremia: Secondary | ICD-10-CM

## 2011-09-08 LAB — GLUCOSE, CAPILLARY
Glucose-Capillary: 115 mg/dL — ABNORMAL HIGH (ref 70–99)
Glucose-Capillary: 222 mg/dL — ABNORMAL HIGH (ref 70–99)
Glucose-Capillary: 103 mg/dL — ABNORMAL HIGH (ref 70–99)
Glucose-Capillary: 117 mg/dL — ABNORMAL HIGH (ref 70–99)

## 2011-09-08 MED ORDER — LABETALOL HCL 200 MG PO TABS
200.0000 mg | ORAL_TABLET | Freq: Three times a day (TID) | ORAL | Status: DC
  Administered 2011-09-08 – 2011-10-04 (×73): 200 mg via ORAL
  Filled 2011-09-08 (×83): qty 1

## 2011-09-08 MED ORDER — ONDANSETRON HCL 4 MG PO TABS: 4.0000 mg | ORAL_TABLET | Freq: Four times a day (QID) | ORAL | Status: DC | PRN

## 2011-09-08 MED ORDER — SORBITOL 70 % SOLN
30.0000 mL | Freq: Every day | Status: DC | PRN
  Administered 2011-09-10: 30 mL via ORAL
  Filled 2011-09-08: qty 30

## 2011-09-08 MED ORDER — ACETAMINOPHEN 325 MG PO TABS
325.0000 mg | ORAL_TABLET | ORAL | Status: DC | PRN
  Administered 2011-09-14: 650 mg via ORAL
  Filled 2011-09-08 (×2): qty 2

## 2011-09-08 MED ORDER — BIOTENE DRY MOUTH MT LIQD
15.0000 mL | Freq: Two times a day (BID) | OROMUCOSAL | Status: DC
  Administered 2011-09-08 – 2011-09-20 (×22): 15 mL via OROMUCOSAL

## 2011-09-08 MED ORDER — NYSTATIN 100000 UNIT/ML MT SUSP
5.0000 mL | Freq: Four times a day (QID) | OROMUCOSAL | Status: DC
  Administered 2011-09-08 – 2011-10-04 (×87): 500000 [IU] via ORAL
  Filled 2011-09-08 (×108): qty 5

## 2011-09-08 MED ORDER — LEVETIRACETAM 500 MG PO TABS
500.0000 mg | ORAL_TABLET | Freq: Two times a day (BID) | ORAL | Status: DC
  Administered 2011-09-08 – 2011-09-20 (×24): 500 mg via ORAL
  Filled 2011-09-08 (×27): qty 1

## 2011-09-08 MED ORDER — ALBUTEROL SULFATE (5 MG/ML) 0.5% IN NEBU: 2.5000 mg | INHALATION_SOLUTION | RESPIRATORY_TRACT | Status: DC | PRN

## 2011-09-08 MED ORDER — HYDRALAZINE HCL 10 MG PO TABS
20.0000 mg | ORAL_TABLET | Freq: Three times a day (TID) | ORAL | Status: DC
  Administered 2011-09-08 (×2): 20 mg via ORAL
  Administered 2011-09-09: 10 mg via ORAL
  Administered 2011-09-09: 20 mg via ORAL
  Administered 2011-09-09: 10 mg via ORAL
  Administered 2011-09-10 – 2011-09-22 (×37): 20 mg via ORAL
  Filled 2011-09-08 (×49): qty 2

## 2011-09-08 MED ORDER — ONDANSETRON HCL 4 MG/2ML IJ SOLN: 4.0000 mg | Freq: Four times a day (QID) | INTRAMUSCULAR | Status: DC | PRN

## 2011-09-08 MED ORDER — PANTOPRAZOLE SODIUM 40 MG PO PACK
40.0000 mg | PACK | Freq: Every day | ORAL | Status: DC
  Administered 2011-09-09: 40 mg
  Filled 2011-09-08 (×2): qty 20

## 2011-09-08 MED ORDER — STARCH (THICKENING) PO POWD
6.0000 g | ORAL | Status: DC | PRN
  Filled 2011-09-08: qty 227

## 2011-09-08 MED ORDER — NIMODIPINE 30 MG PO CAPS
60.0000 mg | ORAL_CAPSULE | ORAL | Status: DC
  Administered 2011-09-08 (×3): 60 mg via ORAL
  Filled 2011-09-08 (×11): qty 2

## 2011-09-08 MED ORDER — CHLORHEXIDINE GLUCONATE 0.12 % MT SOLN
15.0000 mL | Freq: Two times a day (BID) | OROMUCOSAL | Status: DC
  Administered 2011-09-08 – 2011-09-21 (×25): 15 mL via OROMUCOSAL
  Filled 2011-09-08 (×30): qty 15

## 2011-09-08 MED ORDER — NYSTATIN 100000 UNIT/ML MT SUSP
5.0000 mL | Freq: Four times a day (QID) | OROMUCOSAL | Status: DC
  Administered 2011-09-08 (×2): 500000 [IU] via ORAL
  Filled 2011-09-08 (×4): qty 5

## 2011-09-08 MED ORDER — SENNOSIDES-DOCUSATE SODIUM 8.6-50 MG PO TABS
1.0000 | ORAL_TABLET | Freq: Every evening | ORAL | Status: DC | PRN
  Administered 2011-09-10: 1 via ORAL
  Filled 2011-09-08: qty 1

## 2011-09-08 NOTE — Progress Notes (Signed)
Patient settled on unit, family at bedside.  Vital signs stable, blood sugar 103.

## 2011-09-08 NOTE — H&P (Signed)
Physical Medicine and Rehabilitation Admission H&P    No chief complaint on file. : HPI: [name] is a 61 y.o. African American right-handed female with history of hypertension. Admitted 08/18/2011 with severe onset of headaches and became unresponsive with frothing of the mouth. In the emergency department CT scan imaging of the head showed diffuse subarachnoid hemorrhage as well as small infarct left internal capsule. chest x-ray showing aspiration pneumonia. She was subsequently intubated and later extubated after several hours. Underwent right frontal IVC placement 08/18/2011 per Dr. Venetia Maxon. 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 Underwent coiling of aneurysm 08/19/2011 per Dr. Corliss Skains. Hospital course with VP shunt placed 09/02/2011 due to enlarging ventricles and elevated intracranial pressure. Maintained on Keppra for seizure prophylaxis. She continues on nimotop for blood pressure control. Followup cranial CT scan due to bouts of lethargy 09/07/2011 showed VP shunt well positioned with no hemorrhage or new brain infarcts. Bouts of hyponatremia 148-154 and monitored.Bouts of hyperglycemia with low-dose insulin therapy initiated and blood sugars normalizing.. Followup speech therapy with modified barium swallow maintained on dysphagia 1 nectar thick liquid diet. Progress has been slow with physical and occupational therapy ongoing and recommendations of physical medicine rehabilitation consult to consider inpatient rehabilitation services. Patient was felt to be a good candidate for inpatient rehabilitation services and was admitted for comprehensive rehabilitation program  Review of Systems  Unable to perform ROS   Past Medical History  Diagnosis Date  . Hypertension    Past Surgical History  Procedure Date  . Abdominal hysterectomy   . Ventriculoperitoneal shunt 09/02/2011    Procedure: SHUNT INSERTION VENTRICULAR-PERITONEAL;  Surgeon: Maeola Harman, MD;   Location: [facility];  Service: Neurosurgery;  Laterality: Right;   No family history on file. Social History:  reports that she has never smoked. She does not have any smokeless tobacco history on file. She reports that she does not drink alcohol or use illicit drugs. Allergies:  Allergies  Allergen Reactions  . Lactose Intolerance (Gi) Other (See Comments)    G.I. Upset   Medications Prior to Admission  Medication Sig Dispense Refill  . cloNIDine (CATAPRES) 0.1 MG tablet Take 0.1 mg by mouth 2 (two) times daily.      . hydrochlorothiazide (HYDRODIURIL) 25 MG tablet Take 1 tablet (25 mg total) by mouth daily.  20 tablet  0  . ibuprofen (ADVIL,MOTRIN) 800 MG tablet Take 800 mg by mouth every 8 (eight) hours as needed. Pain      . morphine (ROXANOL) 20 MG/ML concentrated solution Take 0.5-1 mLs (10-20 mg total) by mouth every 2 (two) hours as needed for pain.  50 mL  0    Home:     Functional History:    Functional Status:  Mobility:          ADL:    Cognition:       There were no vitals taken for this visit. Physical Exam  HENT:  Staples in place to VP shunt site  Eyes:  Pupils are reactive to light and  Neck: No thyromegaly present.  Cardiovascular: Normal rate and regular rhythm.  Pulmonary/Chest: Breath sounds normal. No respiratory distress. She has no wheezes.  Abdominal: Bowel sounds are normal. She exhibits no distension. There is no tenderness.  Neurological:  Patient with flat affect and easily distracted. She was nonverbal during exam.. She did  follow commandsbut then became distracted.. She does withdrawal to deep stimulihowever sensory testing limited by her mental status Upper extremities have 4 minus  grip arm flexion extension.   Right lower extremitytrace active movement although once again not clear whether or not she is fully aware of commands she does have right knee pain with range of motion Left lower extremity has 2 minus hip knee extensor  synergy Skilled skeletal right knee pain with range of motion no evidence of effusion. No joint line tenderness. No patellar instability  Results for orders placed during the hospital encounter of 08/16/11 (from the past 48 hour(s))  GLUCOSE, CAPILLARY     Status: Abnormal   Collection Time   09/06/11  4:58 PM      Component Value Range Comment   Glucose-Capillary 111 (*) 70 - 99 mg/dL    Comment 1 Documented in Chart      Comment 2 Notify RN     GLUCOSE, CAPILLARY     Status: Abnormal   Collection Time   09/06/11  8:09 PM      Component Value Range Comment   Glucose-Capillary 161 (*) 70 - 99 mg/dL    Comment 1 Documented in Chart      Comment 2 Notify RN     GLUCOSE, CAPILLARY     Status: Abnormal   Collection Time   09/07/11 12:11 AM      Component Value Range Comment   Glucose-Capillary 119 (*) 70 - 99 mg/dL    Comment 1 Documented in Chart      Comment 2 Notify RN     GLUCOSE, CAPILLARY     Status: Abnormal   Collection Time   09/07/11  4:40 AM      Component Value Range Comment   Glucose-Capillary 114 (*) 70 - 99 mg/dL    Comment 1 Documented in Chart      Comment 2 Notify RN     GLUCOSE, CAPILLARY     Status: Abnormal   Collection Time   09/07/11  8:22 AM      Component Value Range Comment   Glucose-Capillary 102 (*) 70 - 99 mg/dL   GLUCOSE, CAPILLARY     Status: Abnormal   Collection Time   09/07/11 11:34 AM      Component Value Range Comment   Glucose-Capillary 131 (*) 70 - 99 mg/dL   GLUCOSE, CAPILLARY     Status: Abnormal   Collection Time   09/07/11  5:09 PM      Component Value Range Comment   Glucose-Capillary 175 (*) 70 - 99 mg/dL   GLUCOSE, CAPILLARY     Status: Normal   Collection Time   09/07/11  9:49 PM      Component Value Range Comment   Glucose-Capillary 82  70 - 99 mg/dL    Comment 1 Documented in Chart      Comment 2 Notify RN     GLUCOSE, CAPILLARY     Status: Abnormal   Collection Time   09/08/11  7:10 AM      Component Value Range Comment    Glucose-Capillary 115 (*) 70 - 99 mg/dL    Comment 1 Documented in Chart      Comment 2 Notify RN     GLUCOSE, CAPILLARY     Status: Abnormal   Collection Time   09/08/11 11:47 AM      Component Value Range Comment   Glucose-Capillary 222 (*) 70 - 99 mg/dL    Comment 1 Notify RN      Comment 2 Documented in Chart      Ct Head Wo Contrast  09/07/2011  *RADIOLOGY REPORT*  Clinical Data: Decreased level of consciousness.  Aneurysm coiling. Previous shunt.  CT HEAD WITHOUT CONTRAST  Technique:  Contiguous axial images were obtained from the base of the skull through the vertex without contrast.  Comparison: 09/01/2011 and multiple previous  Findings: Hyperdensity related to intravascular coils at the base of the brain again noted.  Replacement of a ventricular shunt catheter through a previously placed right frontal burr hole with the catheter entering the frontal horn of the right lateral ventricle, passing through the foramen of Monro and entering the third ventricle.  There is ventricular decompression.  No evidence of additional hemorrhage.  Areas of infarction in the frontal lobes and in the region of the left genu of the corpus callosum and left basal ganglia again noted.  No new infarction.  Visualized sinuses are clear.  IMPRESSION: VP shunt well positioned with the tip entering the third ventricle. Ventricular decompression.  No hemorrhage.  Low densities in the frontal lobes, corpus callosum and left basal ganglia/radiating white matter tracts again noted.  No new brain infarctions.   Original Report Authenticated By: Thomasenia Sales, M.D.     Post Admission Physician Evaluation: 1. Functional deficits secondary  to subarachnoid hemorrhage do to aneurysmal rupture with hydrocephalus status post VP shunting.. 2. Patient is admitted to receive collaborative, interdisciplinary care between the physiatrist, rehab nursing staff, and therapy team. 3. Patient's level of medical complexity and  substantial therapy needs in context of that medical necessity cannot be provided at a lesser intensity of care such as a SNF. 4. Patient has experienced substantial functional loss from his/her baseline which was documented above under the "Functional History" and "Functional Status" headings.  Judging by the patient's diagnosis, physical exam, and functional history, the patient has potential for functional progress which will result in measurable gains while on inpatient rehab.  These gains will be of substantial and practical use upon discharge  in facilitating mobility and self-care at the household level. 5. Physiatrist will provide 24 hour management of medical needs as well as oversight of the therapy plan/treatment and provide guidance as appropriate regarding the interaction of the two. 6. 24 hour rehab nursing will assist with bladder management, bowel management, safety, skin/wound care, disease management, medication administration, pain management and patient education  and help integrate therapy concepts, techniques,education, etc. 7. PT will assess and treat for:  Pre-gait training, gait training, neuromuscular reeducation, equipment, safety, endurance.  Goals are: supervision to min assist wheelchair level mobility. 8. OT will assess and treat for: ADLs, cognitive perceptual skills, neuromuscular reeducation, safety, endurance, balance, equipment.   Goals are: min assist wheelchair level ADLs. 9. SLP will assess and treat for: communication, swallow, admission.  Goals are: safety O. Intake of regular diet. Communicate basic needs. 10. Case Management and Social Worker will assess and treat for psychological issues and discharge planning. 11. Team conference will be held weekly to assess progress toward goals and to determine barriers to discharge. 12. Patient will receive at least 3 hours of therapy per day at least 5 days per week. 13. ELOS: 3-4 weeks      Prognosis:  good and  fair   Medical Problem List and Plan: 1. subarachnoid hemorrhage with small infarct left internal capsule: Status post IVC placement 08/18/2011 with coiling of aneurysm 08/19/2011 and VP shunt placement 09/02/2011 2. DVT Prophylaxis/Anticoagulation: SCDs. Monitor for any signs of DVT 3. Pain Management: Tylenol for now. Monitor with increased mobility.  Right knee and  right ankle pain we'll check x-rays likely degenerative changes, no recent trauma 4. Neuropsych: This patient is not capable of making decisions on his/her own behalf. 5. Dysphagia. Dysphagia 1 nectar thick liquids. Follow per speech therapy. Monitor for any signs of aspiration 6. Seizure prophylaxis. Keppra 500 mg twice a day 7. Hypertension. Hydralazine 20 mg 3 times a day, labetalol 200 mg 3 times a day, name of top 60 mg every 4 hours. Monitor the increased mobility 8. Hypernatremia. Followup labs 9. Hyperglycemia. Latest blood sugars [id number] No reports of diabetes mellitus. Patient presently on Lantus insulin 10 units each bedtime. Check blood sugars a.c. and at bedtime. Will check hemoglobin A1c 09/08/2011, 4:44 PM

## 2011-09-08 NOTE — Progress Notes (Signed)
Speech Language Pathology Dysphagia Treatment Patient Details Name: [name] MRN: [id number] DOB: 06-30-1950 Today's Date: 09/08/2011 Time: 4098-1191 SLP Time Calculation (min): 14 min  Assessment / Plan / Recommendation Clinical Impression  Pt. seen for safe consumption of Dys. 1 Diet with nectar thick liquids. Pt.'s level alertness and attention is significantly improved today. She is able to follow commands and implement compensatory strategies with mod verbal/tactile/visual cues. No s/s of aspiration observed at bedside with puree or nectar thick consistencies. SLP recommends continuing current diet and reassessing readiness for MBS in inpatient rehab.     Diet Recommendation  Continue with Current Diet: Dysphagia 1 (puree);Nectar-thick liquid    SLP Plan Discharge SLP treatment due to (comment) (transfer to inpatient rehab at [facility])      Swallowing Goals  SLP Swallowing Goals Patient will consume recommended diet without observed clinical signs of aspiration with: Moderate cueing Swallow Study Goal #1 - Progress: Met Patient will utilize recommended strategies during swallow to increase swallowing safety with: Moderate cueing Swallow Study Goal #2 - Progress: Met  General Temperature Spikes Noted: No Respiratory Status: Room air Behavior/Cognition: Cooperative;Requires cueing;Lethargic Oral Cavity - Dentition: Edentulous Patient Positioning: Upright in bed  Oral Cavity - Oral Hygiene Does patient have any of the following "at risk" factors?: Tongue - coated Brush patient's teeth BID with toothbrush (using toothpaste with fluoride): Yes Patient is AT RISK - Oral Care Protocol followed (see row info): Yes   Dysphagia Treatment Treatment focused on: Skilled observation of diet tolerance;Upgraded PO texture trials;Patient/family/caregiver education;Utilization of compensatory strategies Treatment Methods/Modalities: Skilled observation Patient observed directly with PO's:  Yes Type of PO's observed: Dysphagia 1 (puree);Nectar-thick liquids Feeding: Able to feed self;Needs assist Liquids provided via: Cup;No straw Pharyngeal Phase Signs & Symptoms: Suspected delayed swallow initiation (prompt needed 2x for intial swallow ) Type of cueing: Verbal;Tactile;Visual Amount of cueing: Moderate   GO     Theotis Burrow 09/08/2011, 1:08 PM

## 2011-09-08 NOTE — Clinical Social Work Note (Signed)
CSW met with pt to address discharge plan. Per CIR, pt will be admitted later today. Pt is in agreement to discharge plan. CSW is signing off as no further needs identified.   Dede Query, MSW, Theresia Majors [phone]

## 2011-09-08 NOTE — Progress Notes (Signed)
OK to transfer to Rehab today

## 2011-09-08 NOTE — Progress Notes (Signed)
Awakens to touch, albeit briefly. Opens eyes on command after awakened and moves extremities. Quickly fall back to sleep. PEARL. Incisions without erythema, swelling, or drainage. Belly soft, without evidence of discomfort with palpation. Foley & Flexiseal patent.   Due to varied levels of responsiveness/cooperation/activity over the last few days paired with improved head CT s/p shunt placement makes the possibility of sleep cycle changes seems valid. Hopeful of increased interaction and participation with CIR assessment today.   Georgiann Cocker, RN, BSN

## 2011-09-08 NOTE — Progress Notes (Signed)
Patient to be admitted to inpt rehab today. I discussed with Attending MD and we plan admit later today. I discussed with pt's spouse and pt . Spouse in agreement. [phone]

## 2011-09-08 NOTE — PMR Pre-admission (Signed)
PMR Admission Coordinator Pre-Admission Assessment  Patient: [name] is an 61 y.o., female MRN: [id number] DOB: March 18, 1950 Height: 5\' 6"  (167.6 cm) Weight: 98.7 kg (217 lb 9.5 oz)  Insurance Information  PRIMARY: self        Medicaid Application Date:  MAD MCD app      Case Manager:? Diane with RCDSS [id number] [phone]   Disability Application Date:  [facility] to [facility]     Case Worker:   Emergency Contact Information Contact Information    Name Relation Home Work Mobile   Mawson,Melvin Spouse [phone]  [phone]     Current Medical History  Patient Admitting Diagnosis: Subarachnoid hemorrhage with severe cognitive deficits with decreased arousal as well as weakness.  History of Present Illness: [name] is a 61 y.o. African American right-handed female with history of hypertension. Admitted 08/18/2011 with severe onset of headaches and became unresponsive with frothing of the mouth. In the emergency department CT scan imaging of the head showed diffuse subarachnoid hemorrhage as well as small infarct left internal capsule. chest x-ray showing aspiration pneumonia. She was subsequently intubated and later extubated after several hours. Underwent right frontal IVC placement 08/18/2011 per Dr. Venetia Maxon. 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 Underwent coiling of aneurysm 08/19/2011 per Dr. Corliss Skains. Hospital course with VP shunt placed 09/02/2011 due to enlarging ventricles and elevated intracranial pressure. Maintained on Keppra for seizure prophylaxis. She continues on nimotop for blood pressure control. Followup speech therapy with modified barium swallow maintained on dysphagia 1 nectar thick liquid diet. Progress has been slow with physical and occupational therapy ongoing and recommendations of physical medicine rehabilitation consult to consider inpatient rehabilitation services  Total: 6     Past Medical History  Past Medical  History  Diagnosis Date  . Hypertension     Family History  family history is not on file.  Prior Rehab/Hospitalizations: none  Current Medications  Current facility-administered medications:albuterol (PROVENTIL) (5 MG/ML) 0.5% nebulizer solution 2.5 mg, 2.5 mg, Nebulization, Q4H PRN, Erick Blinks, MD;  antiseptic oral rinse (BIOTENE) solution 15 mL, 15 mL, Mouth Rinse, q12n4p, Maeola Harman, MD, 15 mL at 09/07/11 1700;  chlorhexidine (PERIDEX) 0.12 % solution 15 mL, 15 mL, Mouth Rinse, BID, Maeola Harman, MD, 15 mL at 09/08/11 6578 feeding supplement (PRO-STAT SUGAR FREE 64) liquid 30 mL, 30 mL, Per Tube, QID, Haynes Bast, RD, 30 mL at 09/07/11 2157;  fentaNYL (SUBLIMAZE) injection 50-100 mcg, 50-100 mcg, Intravenous, Q2H PRN, Roxine Caddy, MD, 100 mcg at 08/28/11 4696;  food thickener (THICK IT) powder 6 g, 6 g, Oral, PRN, Maeola Harman, MD;  hydrALAZINE (APRESOLINE) tablet 20 mg, 20 mg, Oral, Q8H, Maeola Harman, MD, 20 mg at 09/08/11 0607 HYDROcodone-acetaminophen (LORTAB) 7.5-500 MG/15ML solution 10 mL, 10 mL, Oral, Q4H PRN, Storm Frisk, MD, 10 mL at 08/28/11 2233;  insulin aspart (novoLOG) injection 0-15 Units, 0-15 Units, Subcutaneous, TID WC & HS, Maeola Harman, MD;  insulin glargine (LANTUS) injection 10 Units, 10 Units, Subcutaneous, QHS, Leslye Peer, MD, 10 Units at 09/07/11 2205 labetalol (NORMODYNE) tablet 200 mg, 200 mg, Oral, TID, Maeola Harman, MD, 200 mg at 09/07/11 2157;  labetalol (NORMODYNE,TRANDATE) injection 10 mg, 10 mg, Intravenous, Q2H PRN, Storm Frisk, MD, 10 mg at 08/28/11 1853;  levETIRAcetam (KEPPRA) tablet 500 mg, 500 mg, Oral, BID, Maeola Harman, MD, 500 mg at 09/07/11 2157;  niMODipine (NIMOTOP) capsule 60 mg, 60 mg, Oral, Q4H, Maeola Harman, MD, 60 mg at 09/08/11 0813 nystatin (MYCOSTATIN)  100000 UNIT/ML suspension 500,000 Units, 5 mL, Oral, QID, Maeola Harman, MD;  ondansetron [facility]) injection 4 mg, 4 mg, Intravenous, Q6H PRN, Oneal Grout, MD;   pantoprazole sodium (PROTONIX) 40 mg/20 mL oral suspension 40 mg, 40 mg, Per Tube, Q1200, Crystal Stillinger Robertson, PHARMD, 40 mg at 09/07/11 1116;  sodium chloride 0.9 % injection 10-40 mL, 10-40 mL, Intracatheter, Q12H, Maeola Harman, MD sodium chloride 0.9 % injection 10-40 mL, 10-40 mL, Intracatheter, PRN, Maeola Harman, MD, 10 mL at 09/08/11 7846;  DISCONTD: insulin aspart (novoLOG) injection 0-15 Units, 0-15 Units, Subcutaneous, Q4H, Zigmund Gottron, MD, 3 Units at 09/07/11 1715  Patients Current Diet: Dysphagia Dysphagia 1 with nectar liquids  Precautions / Restrictions Precautions Precautions: Fall Precaution Comments: multiple lines and leads including rectal pouch Restrictions Weight Bearing Restrictions: No   Prior Activity Level Community (5-7x/wk): worked 36 hrs per week 7p until 7 am at [employer]. 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 Assistive Devices/Equipment: None Home Adaptive Equipment: None  Prior Functional Level Prior Function Level of Independence: Independent Able to Take Stairs?: Yes Driving: Yes Vocation: Part time employment Comments: worked at [employer]  Current Functional Level Cognition  Arousal/Alertness: Awake/alert Overall Cognitive Status: Impaired Difficult to assess due to: Impaired communication Current Attention Level: Sustained Attention - Other Comments: 2-3 mins Orientation Level: Oriented to person;Other (comment) Following Commands: Follows one step commands consistently;Follows one step commands with increased time Safety/Judgement: Impulsive;Decreased safety judgement for tasks assessed;Decreased awareness of need for assistance Awareness of Errors: Assistance required to identify errors made;Assistance required to correct errors made Cognition - Other Comments: Responds to all questions with nodding or shaking of head-not always an accurate response to question asked.    Extremity  Assessment (includes Sensation/Coordination)  RUE ROM/Strength/Tone: Deficits RUE ROM/Strength/Tone Deficits: strength 3- out 5 noted, grasp 3- out 5 RUE Coordination: Deficits  RLE ROM/Strength/Tone: Deficits RLE ROM/Strength/Tone Deficits: Trace spontaneous movement in gross extension    ADLs  Grooming: Performed;Wash/dry hands;Min guard (apply lotion to hands) Where Assessed - Grooming: Unsupported sitting Toilet Transfer: Simulated;+2 Total assistance Toilet Transfer: Patient Percentage: 40% Toilet Transfer Method: Sit to Barista: Raised toilet seat with arms (or 3-in-1 over toilet) Toileting - Clothing Manipulation and Hygiene: Simulated;+2 Total assistance Toileting - Clothing Manipulation and Hygiene: Patient Percentage: 0% Equipment Used: Gait belt Transfers/Ambulation Related to ADLs: pt sit<>stand only from chair. pt demonstrates hyperextension bil LE. Pt shifting weight at times however unable to take a step or lift LE off floor. pt bracing bil LE against chair for support. Pt requires blocking BIL LE with support of chair removed and hyper extension prevented ADL Comments: Pt. moved to EOB with mod A and increased time. Pt. maintained EOB x 35 mins with min guard assist.  Pt. at times rocking posteriorly and anteriorly, but does not lose balance.  Pt. demonstrates ~80% AROM Rt. UE and Lt. UE AROM WFL.  Pt. following one step commands consistently.  Pt. able to state dtr's, and husband's name when asked.  She states she is fine, and stated "no" when asked if she needed anything, or if she were in pain.  Pt. requires increased time to respond, or engage in activity.   Pt. indicated fatigue and moved to supine position with min A and increased time.       Mobility  Bed Mobility: Rolling Left;Left Sidelying to Sit;Sitting - Scoot to [place] of Bed;Sit to Supine;Scooting to [facility] Rolling Left: 3: Mod assist  Rolling Left: Patient Percentage: 50% Left Sidelying to  Sit: 3: Mod assist;With rails Left Sidelying to Sit: Patient Percentage: 30% Sitting - Scoot to Edge of Bed: 4: Min assist Sitting - Scoot to [place] of Bed: Patient Percentage: 10% Sit to Supine: 4: Min assist;With rail;HOB flat Sit to Supine: Patient Percentage: 20% Scooting to HOB: 4: Min assist;With rail (mod instructional cues)    Transfers  Transfers: Sit to Stand;Stand to Sit Sit to Stand: 1: +2 Total assist Sit to Stand: Patient Percentage: 40% Stand to Sit: 1: +2 Total assist Stand to Sit: Patient Percentage: 40%    Ambulation / Gait / Stairs / Psychologist, prison and probation services  Ambulation/Gait Ambulation/Gait Assistance: Not tested (comment) Stairs: No Wheelchair Mobility Wheelchair Mobility: No    Posture / Games developer Sitting - Balance Support: Bilateral upper extremity supported;Feet supported Static Sitting - Level of Assistance: 5: Stand by assistance Static Sitting - Comment/# of Minutes: 15 Dynamic Sitting Balance Dynamic Sitting - Balance Support: No upper extremity supported;Left upper extremity supported Dynamic Sitting - Level of Assistance: 5: Stand by assistance (to min guard assist) Dynamic Sitting Balance - Compensations: Pt. rocking back and forth at times Dynamic Sitting - Balance Activities:  (grooming) Static Standing Balance Static Standing - Balance Support: Bilateral upper extremity supported Static Standing - Level of Assistance: 1: +2 Total assist (60%) Static Standing - Comment/# of Minutes: Pt stood 2 minutes x1 and 3 minutes with maximum verbal and tactile cues for midline posture (preventing right rotation of trunk) at upper trunk and pelvis and blocking at bil knees. Pt also tends to hyperextend knees when they are not buckling.     Previous Home Environment Lives With: Spouse Available Help at Discharge: Family Type of Home: House Home Layout: One level Bathroom Shower/Tub: Tub/shower unit;Door [employer] Toilet:  Standard Bathroom Accessibility: Yes How Accessible: Accessible via wheelchair;Accessible via walker Home Care Services: No  Discharge Living Setting Plans for Discharge Living Setting: Patient's home;Lives with (comment) (spouse) Type of Home at Discharge: House Discharge Home Layout: One level Discharge Bathroom Shower/Tub: Tub/shower unit Discharge Bathroom Toilet: Standard Discharge Bathroom Accessibility: Yes How Accessible: Accessible via walker Do you have any problems obtaining your medications?: No  Social/Family/Support Systems Patient Roles: Spouse;Parent (part time employee) Contact Information: Danae Chen, spouse Anticipated Caregiver: spouse, nieces, children Anticipated Caregiver's Contact Information: see above Ability/Limitations of Caregiver: min assist. 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  Goals/Additional Needs Patient/Family Goal for Rehab: min PT, min OT, MIn SLP Expected length of stay: ELOS 2 to 3 weeks Cultural Considerations: very religious family Additional Information: When pt originally admitted and on vent, there were discussions around withdrawal support and comfort care Pt/Family Agrees to Admission and willing to participate: Yes Program Orientation Provided & Reviewed with Pt/Caregiver Including Roles  & Responsibilities: Yes  Patient Condition: This patient's condition remains as documented in the Consult dated 09/07/11, in which the Rehabilitation Physician determined and documented that the patient's condition is appropriate for intensive rehabilitative care in an inpatient rehabilitation facility.  Preadmission Screen Completed By:  Clois Dupes, 09/08/2011 11:36 AM ______________________________________________________________________   Discussed status with Dr. Wynn Banker on 09/08/11 at  1135 and received telephone approval for admission today.  Admission Coordinator:  Clois Dupes, time 1610 date 09/08/11 .

## 2011-09-09 ENCOUNTER — Inpatient Hospital Stay (HOSPITAL_COMMUNITY): Payer: Medicaid Other | Admitting: *Deleted

## 2011-09-09 ENCOUNTER — Inpatient Hospital Stay (HOSPITAL_COMMUNITY): Payer: Medicaid Other | Admitting: Physical Therapy

## 2011-09-09 ENCOUNTER — Encounter (HOSPITAL_COMMUNITY): Payer: Self-pay | Admitting: General Practice

## 2011-09-09 ENCOUNTER — Inpatient Hospital Stay (HOSPITAL_COMMUNITY): Payer: Medicaid Other | Admitting: Speech Pathology

## 2011-09-09 DIAGNOSIS — I609 Nontraumatic subarachnoid hemorrhage, unspecified: Secondary | ICD-10-CM

## 2011-09-09 DIAGNOSIS — Z5189 Encounter for other specified aftercare: Secondary | ICD-10-CM

## 2011-09-09 LAB — CBC WITH DIFFERENTIAL/PLATELET
Basophils Relative: 0 % (ref 0–1)
HCT: 32.2 % — ABNORMAL LOW (ref 36.0–46.0)
Hemoglobin: 9.8 g/dL — ABNORMAL LOW (ref 12.0–15.0)
MCHC: 30.4 g/dL (ref 30.0–36.0)
Monocytes Absolute: 0.4 10*3/uL (ref 0.1–1.0)
Monocytes Relative: 5 % (ref 3–12)
Neutro Abs: 5.4 10*3/uL (ref 1.7–7.7)

## 2011-09-09 LAB — COMPREHENSIVE METABOLIC PANEL
Albumin: 2.6 g/dL — ABNORMAL LOW (ref 3.5–5.2)
BUN: 35 mg/dL — ABNORMAL HIGH (ref 6–23)
CO2: 28 mEq/L (ref 19–32)
Chloride: 118 mEq/L — ABNORMAL HIGH (ref 96–112)
Creatinine, Ser: 0.89 mg/dL (ref 0.50–1.10)
GFR calc Af Amer: 78 mL/min — ABNORMAL LOW (ref >90)
GFR calc non Af Amer: 68 mL/min — ABNORMAL LOW (ref >90)
Glucose, Bld: 128 mg/dL — ABNORMAL HIGH (ref 70–99)
Total Bilirubin: 0.4 mg/dL (ref 0.3–1.2)

## 2011-09-09 LAB — URINALYSIS, ROUTINE W REFLEX MICROSCOPIC
Bilirubin Urine: NEGATIVE
Glucose, UA: NEGATIVE mg/dL
Ketones, ur: NEGATIVE mg/dL
pH: 5.5 (ref 5.0–8.0)

## 2011-09-09 LAB — HEMOGLOBIN A1C: Mean Plasma Glucose: 160 mg/dL — ABNORMAL HIGH (ref <117)

## 2011-09-09 LAB — URINE MICROSCOPIC-ADD ON

## 2011-09-09 MED ORDER — SODIUM CHLORIDE 0.9 % IJ SOLN
10.0000 mL | INTRAMUSCULAR | Status: AC | PRN
  Administered 2011-09-09: 20 mL

## 2011-09-09 MED ORDER — NIMODIPINE 60 MG/20ML PO SOLN
60.0000 mg | ORAL | Status: DC
  Administered 2011-09-09 – 2011-09-14 (×26): 60 mg via ORAL
  Filled 2011-09-09 (×36): qty 20

## 2011-09-09 MED ORDER — HYDROCERIN EX CREA
TOPICAL_CREAM | Freq: Two times a day (BID) | CUTANEOUS | Status: DC
  Administered 2011-09-09 – 2011-09-16 (×16): via TOPICAL
  Administered 2011-09-17: 1 via TOPICAL
  Administered 2011-09-18 – 2011-10-04 (×29): via TOPICAL
  Filled 2011-09-09 (×3): qty 113

## 2011-09-09 MED ORDER — NIMODIPINE 30 MG/ML ORAL SOLUTION
60.0000 mg | ORAL | Status: DC
  Filled 2011-09-09 (×6): qty 2

## 2011-09-09 MED ORDER — COLCHICINE 0.6 MG PO TABS
0.6000 mg | ORAL_TABLET | Freq: Two times a day (BID) | ORAL | Status: DC
  Administered 2011-09-09 – 2011-09-27 (×36): 0.6 mg via ORAL
  Filled 2011-09-09 (×43): qty 1

## 2011-09-09 NOTE — Progress Notes (Signed)
Patient information reviewed and entered into UDS-PRO system by Shaunessy Dobratz, RN, CRRN, PPS Coordinator.  Information including medical coding and functional independence measure will be reviewed and updated through discharge.    

## 2011-09-09 NOTE — Evaluation (Signed)
Occupational Therapy Assessment and Plan  Patient Details  Name: [name] MRN: [id number] Date of Birth: Oct 31, 1950  OT Diagnosis: apraxia Rehab Potential: Rehab Potential: Fair ELOS:   3-4 weeks  Today's Date: 09/09/2011 Time:  -   1045-1215  ( 90 min.)  1st session                                1415-1445  (30 min)  2nd session    Problem List:  Patient Active Problem List  Diagnosis  . Subarachnoid hemorrhage  . Encephalopathy  . Aspiration pneumonia  . Acute respiratory failure  . HTN (hypertension)  . Vasospasm  . SAH (subarachnoid hemorrhage)    Past Medical History:  Past Medical History  Diagnosis Date  . Hypertension    Past Surgical History:  Past Surgical History  Procedure Date  . Abdominal hysterectomy   . Ventriculoperitoneal shunt 09/02/2011    Procedure: SHUNT INSERTION VENTRICULAR-PERITONEAL;  Surgeon: Maeola Harman, MD;  Location: [facility];  Service: Neurosurgery;  Laterality: Right;    Assessment & Plan Clinical Impression: Clinical Impression: Patient is a 61 y.o. year old female with recent admission to the hospital on 08/18/2011 with severe onset of headaches and became unresponsive with frothing of the mouth. In the emergency department CT scan imaging of the head showed diffuse subarachnoid hemorrhage as well as small infarct left internal capsule. chest x-ray showing aspiration pneumonia. She was subsequently intubated and later extubated after several hours. Underwent right frontal IVC placement 08/18/2011 per Dr. Venetia Maxon. 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 Underwent coiling of aneurysm 08/19/2011 per Dr. Corliss Skains. Hospital course with VP shunt placed 09/02/2011 due to enlarging ventricles and elevated intracranial pressure. Patient transferred to CIR on 09/08/2011   Patient transferred to CIR on 09/08/2011 .    Patient currently requires total with basic self-care skills secondary to impaired timing and  sequencing, abnormal tone, unbalanced muscle activation, motor apraxia, decreased coordination and decreased motor planning.  Prior to hospitalization, patient could complete basic ADL  with no assist.  Patient will benefit from skilled intervention to increase independence with basic self-care skills prior to discharge home with care partner.  Anticipate patient will require 24 hour supervision and follow up home health.  OT - End of Session Activity Tolerance: Tolerates < 10 min activity, no significant change in vital signs Endurance Deficit: Yes Endurance Deficit Description: closes eyes OT Assessment Rehab Potential: Fair Barriers to Discharge: Other (comment) (multiple deficits) OT Plan OT Frequency: 1-2 X/day, 60-90 minutes;5 out of 7 days  OT Evaluation Precautions/Restrictions  Precautions Precautions: Fall Precaution Comments:  (foley catheter, rectal tube removed) Restrictions Weight Bearing Restrictions: No General   Vital Signs O2 Device: None (Room air) Pain  none   Home Living/Prior Functioning Home Living Lives With: Spouse Available Help at Discharge: Family Type of Home: House Home Access: Stairs to enter Secretary/administrator of Steps: 3 Entrance Stairs-Rails: None Home Layout: One level Bathroom Shower/Tub: Tub/shower unit;Door [employer] Toilet: Standard Bathroom Accessibility: Yes How Accessible: Accessible via wheelchair;Accessible via walker Home Adaptive Equipment: None IADL History Homemaking Responsibilities: Yes Meal Prep Responsibility: Secondary Laundry Responsibility: Primary Cleaning Responsibility: Secondary Bill Paying/Finance Responsibility: Secondary Shopping Responsibility: Primary Child Care Responsibility: Primary Current License: Yes Mode of Transportation: Friends Occupation: Part time employment Type of Occupation: Tax adviser on [employer] Leisure and Hobbies: church work, coupon clipping Prior Function Level  of Independence: Independent  with basic ADLs;Independent with gait;Independent with transfers;Independent with homemaking with ambulation Able to Take Stairs?: Yes Driving: Yes Vocation: Part time employment ADL   Vision/Perception  Vision - History Baseline Vision: No visual deficits Patient Visual Report: No change from baseline Vision - Assessment Eye Alignment: Within Functional Limits Vision Assessment: Vision not tested Perception Perception: Not tested Praxis Praxis: Impaired Praxis Impairment Details: Ideation  Cognition Overall Cognitive Status: Impaired Arousal/Alertness: Lethargic Orientation Level: Oriented to person Attention: Sustained Focused Attention: Appears intact Sustained Attention: Impaired Sustained Attention Impairment: Functional basic;Verbal basic Decreased Short Term Memory: Verbal basic;Functional basic Awareness: Impaired Awareness Impairment: Intellectual impairment Problem Solving: Impaired Problem Solving Impairment: Verbal basic;Functional basic Executive Function: Initiating;Reasoning;Organizing Reasoning: Impaired Reasoning Impairment: Verbal basic;Functional basic Organizing: Impaired Organizing Impairment: Verbal basic;Functional basic Initiating: Impaired Initiating Impairment: Verbal basic;Functional basic Behaviors: Lability;Perseveration Safety/Judgment: Impaired Sensation Sensation Light Touch: Appears Intact Stereognosis: Not tested Coordination Gross Motor Movements are Fluid and Coordinated: No Fine Motor Movements are Fluid and Coordinated: No Coordination and Movement Description: decreased initiation, impaired coordination R > L Motor  Motor Motor: Abnormal postural alignment and control Motor - Skilled Clinical Observations: generalized weakness, decreased trunk, hip and LE strength Mobility  Bed Mobility Bed Mobility: Sit to Supine;Rolling Right;Rolling Left;Right Sidelying to Sit;Left Sidelying to Sit Rolling  Right: 2: Max assist Rolling Right Details: Manual facilitation for weight shifting;Manual facilitation for weight bearing Rolling Left: 2: Max assist Rolling Left: Patient Percentage: 40%  Trunk/Postural Assessment  Cervical Assessment Cervical Assessment: Within Functional Limits Thoracic Assessment Thoracic Assessment: Within Functional Limits Lumbar Assessment Lumbar Assessment: Exceptions to [facility] Lumbar Strength Overall Lumbar Strength: Deficits Overall Lumbar Strength Comments: decreased weight shifting Lumbar Flexion: 2/5 Lumbar Extension: 2-/5 Postural Control Postural Control: Deficits on evaluation Trunk Control:  (decreased upright; )  Balance Static Sitting Balance Static Sitting - Level of Assistance: 5: Stand by assistance Dynamic Sitting Balance Dynamic Sitting - Balance Support: During functional activity Dynamic Sitting - Level of Assistance: 4: Min assist Static Standing Balance Static Standing - Level of Assistance: 2: Max assist Static Standing - Comment/# of Minutes: with B UE support x 1 minute Dynamic Standing Balance Dynamic Standing - Level of Assistance: 1: +2 Total assist (pt's knees buckle with movement, require total A for balance) Extremity/Trunk Assessment RUE Assessment RUE Assessment: Exceptions to [facility] RUE Strength RUE Overall Strength: Deficits LUE Assessment LUE Assessment: Within Functional Limits  See FIM for current functional status Refer to Care Plan for Long Term Goals  Recommendations for other services: Neuropsych  Discharge Criteria: Patient will be discharged from OT if patient refuses treatment 3 consecutive times without medical reason, if treatment goals not met, if there is a change in medical status, if patient makes no progress towards goals or if patient is discharged from hospital.  The above assessment, treatment plan, treatment alternatives and goals were discussed and mutually agreed upon: by patient and by  family  Humberto Seals 09/09/2011, 5:53 PM

## 2011-09-09 NOTE — Evaluation (Signed)
Speech Language Pathology Assessment and Plan  Patient Details  Name: [name] MRN: [id number] Date of Birth: [birth year]  SLP Diagnosis: Cognitive Impairments  Rehab Potential: Excellent ELOS: 3-4 weeks   Today's Date: 09/09/2011 Time: 0800-0900 Time Calculation (min): 60 min  Skilled Therapeutic Intervention: Administered cognitive-linguistic evaluation and BSE. Please see below for details.   Problem List:  Patient Active Problem List  Diagnosis  . Subarachnoid hemorrhage  . Encephalopathy  . Aspiration pneumonia  . Acute respiratory failure  . HTN (hypertension)  . Vasospasm  . SAH (subarachnoid hemorrhage)   Past Medical History:  Past Medical History  Diagnosis Date  . Hypertension    Past Surgical History:  Past Surgical History  Procedure Date  . Abdominal hysterectomy   . Ventriculoperitoneal shunt 09/02/2011    Procedure: SHUNT INSERTION VENTRICULAR-PERITONEAL;  Surgeon: Maeola Harman, MD;  Location: [facility];  Service: Neurosurgery;  Laterality: Right;    Assessment / Plan / Recommendation Clinical Impression  61 y.o. African American right-handed female with history of hypertension admitted 08/18/2011 with severe onset of headaches and became unresponsive with frothing of the mouth. In the emergency department CT scan imaging of the head showed diffuse subarachnoid hemorrhage as well as small infarct left internal capsule. chest x-ray showing aspiration pneumonia. She was subsequently intubated and later extubated after several hours. Underwent right frontal IVC placement 08/18/2011 per Dr. Venetia Maxon. 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 Underwent coiling of aneurysm 08/19/2011 per Dr. Corliss Skains. Hospital course with VP shunt placed 09/02/2011 due to enlarging ventricles and elevated intracranial pressure. Maintained on Keppra for seizure prophylaxis. Administered MBSS 09/06/11 and recommended dysphagia 1 textures with nectar thick  liquids. Progress has been slow with physical and occupational therapy ongoing and recommendations of physical medicine rehabilitation consult to consider inpatient rehabilitation services. Patient was felt to be a good candidate for inpatient rehabilitation services and was admitted on 09/08/11 for comprehensive rehabilitation program. Pt presents with severe cognitive impairments characterized by decreased sustained attention, intellectual awareness, functional problem solving, working memory and initiation impacting verbal expression, auditory comprehension and overall function and safety. Pt also presents with a cognitive-based dysphagia characterized by decreased AP transit and delayed swallow initiation, however, pt without overt s/s of aspiration with current diet of dys. 1 textures and nectar-thick liquids. Pt would benefit from skilled SLP intervention to maximize cognitive function and overall functional independence.     SLP Assessment  Patient will need skilled Speech Lanaguage Pathology Services during CIR admission    Recommendations  Follow up Recommendations: Home Health SLP;24 hour supervision/assistance Equipment Recommended: None recommended by SLP    SLP Frequency 1-2 X/day, 30-60 minutes   SLP Treatment/Interventions Cognitive remediation/compensation;Cueing hierarchy;Functional tasks;Dysphagia/aspiration precaution training;Internal/external aids;Speech/Language facilitation;Therapeutic Activities;Patient/family education;Environmental controls    Pain No/Denies Pain  Short Term Goals: Week 1: SLP Short Term Goal 1 (Week 1): Pt will initiate functional tasks with Mod A verbal and tactile cues.  SLP Short Term Goal 2 (Week 1): Pt will demonstrate sustained attention to a functional task for ~ 5 minutes with Mod A verbal and visual cues for redirection  SLP Short Term Goal 3 (Week 1): Pt will follow 2 step commands with Max A verbal and question cues.  SLP Short Term Goal 4 (Week  1): Pt will express wants/needs at the word level with Max A verbal and question cues.  SLP Short Term Goal 5 (Week 1): Pt will orient to place, time and situation with Max A contextual, enviornmental and semantic  cues.  SLP Short Term Goal 6 (Week 1): Pt will consume Dys. 1 textures and nectar-thick liquids without overt s/s of aspiraiton with Mod A verbal cues for utilization of swallowing compensatory strategies.   See FIM for current functional status Refer to Care Plan for Long Term Goals  Recommendations for other services: None  Discharge Criteria: Patient will be discharged from SLP if patient refuses treatment 3 consecutive times without medical reason, if treatment goals not met, if there is a change in medical status, if patient makes no progress towards goals or if patient is discharged from hospital.  The above assessment, treatment plan, treatment alternatives and goals were discussed and mutually agreed upon: by patient  [name] 09/09/2011, 12:09 PM

## 2011-09-09 NOTE — Plan of Care (Signed)
Overall Plan of Care [facility]) Patient Details Name: [name] MRN: [id number] DOB: 01-17-1950  Diagnosis:  SAH  Primary Diagnosis:    SAH (subarachnoid hemorrhage) Co-morbidities: HTN, GOUT  Functional Problem List  Patient demonstrates impairments in the following areas: Balance, Cognition, Edema, Endurance, Linguistic, Motor, Pain, Perception and Safety  Basic ADL's: eating, grooming, bathing, dressing and toileting Advanced ADL's: none  Transfers:  bed mobility, bed to chair, toilet, tub/shower, car and furniture Locomotion:  ambulation, wheelchair mobility and stairs  Additional Impairments:  Swallowing, Communication  comprehension and expression and Social Cognition   social interaction, problem solving, memory, attention and awareness  Anticipated Outcomes Item Anticipated Outcome  Eating/Swallowing  Min A  Basic self-care  Minimal assist  Tolieting  Minimal assist  Bowel/Bladder    Transfers  Min A  Locomotion  Min A  Communication  Min A  Cognition  Min A  Pain    Safety/Judgment  Min A  Other     Therapy Plan: PT Frequency: 1-2 X/day, 60-90 minutes OT Frequency: 1-2 X/day, 60-90 minutes;5 out of 7 days SLP Frequency: 1-2 X/day, 30-60 minutes   Team Interventions: Item RN PT OT SLP SW TR Other  Self Care/Advanced ADL Retraining   x      Neuromuscular Re-Education  x x      Therapeutic Activities  x x x     UE/LE Strength Training/ROM  x x      UE/LE Coordination Activities  x x      Visual/Perceptual Remediation/Compensation         DME/Adaptive Equipment Instruction  x x      Therapeutic Exercise  x x      Balance/Vestibular Training  x x      Patient/Family Education  x x x     Cognitive Remediation/Compensation  x x x     Functional Mobility Training  x x      Ambulation/Gait Training  x       Stair Training  x       Wheelchair Propulsion/Positioning  x x      Functional Publishing copy    x     Speech/Language Facilitation    x     Bladder Management         Bowel Management         Disease Management/Prevention         Pain Management  x x      Medication Management         Skin Care/Wound Management         Splinting/Orthotics  x       Discharge Planning  x x x     Psychosocial Support   x x                        Team Discharge Planning: Destination:  Home Projected Follow-up:  PT, OT, SLP and Home Health Projected Equipment Needs:  Biomedical engineer involved in discharge planning:  Yes  MD ELOS: 3-4 WEEKS Medical Rehab Prognosis:  Excellent Assessment: Pt is admitted for CIR therapies. The team will be addressing self-care, mobility, NMR, CPT, pain mgt, adaptive equiopment, family ed. Goals are minimal assist.

## 2011-09-09 NOTE — Progress Notes (Addendum)
Informed Deatra Ina PA about urine color being darker, orange color, cloudy, and malodorous.  UA ordered.  Will continue to monitor.  Barrie Lyme 3:53 PM 09/09/2011   Also spoke with Jesusita Oka about patient showing S&S of depression.  Jesusita Oka, [place] stated the team is waiting for neuropsych to see the patient before possible treatment for depression.  Patient resting comfortably at this time.  No orders.  Will continue to monitor.  Barrie Lyme 4:14 PM 09/09/2011

## 2011-09-09 NOTE — Progress Notes (Signed)
INITIAL ADULT NUTRITION ASSESSMENT Date: 09/09/2011   Time: 10:43 AM  Reason for Assessment: Poor PO Intake  INTERVENTION: 1. Magic cup TID with meals, each supplement provides 290 kcal and 9 grams of protein. 2. Encourage meals as much as able 3. RD to continue to follow nutrition care plan  DOCUMENTATION CODES Per approved criteria  -Obesity Unspecified   ASSESSMENT: Female 61 y.o.  Dx: SAH (subarachnoid hemorrhage)  Hx:  Past Medical History  Diagnosis Date  . Hypertension    Past Surgical History  Procedure Date  . Abdominal hysterectomy   . Ventriculoperitoneal shunt 09/02/2011    Procedure: SHUNT INSERTION VENTRICULAR-PERITONEAL;  Surgeon: Maeola Harman, MD;  Location: [facility];  Service: Neurosurgery;  Laterality: Right;   Related Meds:     . antiseptic oral rinse  15 mL Mouth Rinse q12n4p  . chlorhexidine  15 mL Mouth Rinse BID  . colchicine  0.6 mg Oral BID  . hydrALAZINE  20 mg Oral Q8H  . labetalol  200 mg Oral TID  . levETIRAcetam  500 mg Oral BID  . niMODipine  60 mg Oral Q4H  . nystatin  5 mL Oral QID  . pantoprazole sodium  40 mg Per Tube Q1200   Ht: 5\' 6"  (167.6 cm)  Wt: 216 lb 6.4 oz (98.158 kg)  Ideal Wt: 130 lb/59.1 kg % Ideal Wt: 166%  Wt Readings from Last 15 Encounters:  09/08/11 216 lb 6.4 oz (98.158 kg)  09/07/11 217 lb 9.5 oz (98.7 kg)  09/07/11 217 lb 9.5 oz (98.7 kg)  09/07/11 217 lb 9.5 oz (98.7 kg)  07/08/11 222 lb 7 oz (100.897 kg)  Usual Wt: 222 lb % Usual Wt: 97%  Body mass index is 34.93 kg/(m^2). Pt is obese, class I  Food/Nutrition Related Hx: Regular diet PTA  Labs:  CMP     Component Value Date/Time   NA 155* 09/09/2011 0600   K 3.7 09/09/2011 0600   CL 118* 09/09/2011 0600   CO2 28 09/09/2011 0600   GLUCOSE 128* 09/09/2011 0600   BUN 35* 09/09/2011 0600   CREATININE 0.89 09/09/2011 0600   CALCIUM 9.7 09/09/2011 0600   PROT 6.8 09/09/2011 0600   ALBUMIN 2.6* 09/09/2011 0600   AST 82* 09/09/2011 0600   ALT 78* 09/09/2011 0600     ALKPHOS 84 09/09/2011 0600   BILITOT 0.4 09/09/2011 0600   GFRNONAA 68* 09/09/2011 0600   GFRAA 78* 09/09/2011 0600   CBG (last 3)   Basename 09/08/11 2059 09/08/11 1707 09/08/11 1147  GLUCAP 117* 103* 222*    Intake/Output Summary (Last 24 hours) at 09/09/11 1216 Last data filed at 09/09/11 0519  Gross per 24 hour  Intake      0 ml  Output   1000 ml  Net  -1000 ml   Diet Order: Dysphagia 1 with Nectar Thickened Liquids  Supplements/Tube Feeding: none  IVF:    Estimated Nutritional Needs:   Kcal:  1600 - 1800 kcal Protein:  115 - 130 grams protein Fluid:  1.8 - 2 liters daily  Pt is s/p subarachnoid hemorrhage.  Pt followed by RD staff during acute hospitalization. Pt received enteral nutrition during mechanical ventilation. After extubation, pt failed BSE and pt received enteral nutrition. Pt eventually passed swallow evaluation and diet initiated on 9/3. Intake was poor during diet initiation. Pt consumed 95% of breakfast this morning.  Husband at bedside states that his wife eats well, close to 100% of meals, when encouraged.  Husband states  that when patient isn't offered cueing and encouragement, she will eat significantly less.   NUTRITION DIAGNOSIS: -Inadequate oral intake (NI-2.1).  Status: Ongoing  RELATED TO: decreased appetite  AS EVIDENCE BY: variable meal completion  MONITORING/EVALUATION(Goals): Goal: Pt to meet >/= 90% of their estimated nutrition needs Monitor: weight trends, lab trends, I/O's, PO intake, supplement tolerance  EDUCATION NEEDS: -Education needs addressed   Jarold Motto MS, RD, LDN Pager: [phone] After-hours pager: [phone]

## 2011-09-09 NOTE — Progress Notes (Signed)
Subjective/Complaints: Complains of right ankle pain and swelling. No other obvious complaints per husband. Denies headache A 12 point review of systems has been performed and if not noted above is otherwise negative.  Objective: Vital Signs: Blood pressure 134/73, pulse 75, temperature 98.5 F (36.9 C), temperature source Oral, resp. rate 18, height 5\' 6"  (1.676 m), weight 98.158 kg (216 lb 6.4 oz), SpO2 93.00%. Dg Knee 1-2 Views Right  09/08/2011  *RADIOLOGY REPORT*  Clinical Data: Pain, decreased range of motion  RIGHT KNEE - 1-2 VIEW  Comparison: 12/27/2006  Findings: Normal alignment without fracture or effusion. Relatively preserved joint spaces.  No significant arthropathy. Enthesopathic change of the inferior patella margin.  No significant interval change.  IMPRESSION: No acute finding.  Stable exam   Original Report Authenticated By: Judie Petit. Ruel Favors, M.D.    Dg Ankle 2 Views Right  09/08/2011  *RADIOLOGY REPORT*  Clinical Data: Swelling, decreased range of motion  RIGHT ANKLE - 2 VIEW  Comparison: None.  Findings: Diffuse ankle swelling.  Intact malleoli, talus and calcaneus.  No fracture visualized.  Calcaneal spurring evident.  IMPRESSION: Soft tissue swelling.  No acute fracture.   Original Report Authenticated By: Judie Petit. Ruel Favors, M.D.    Ct Head Wo Contrast  09/07/2011  *RADIOLOGY REPORT*  Clinical Data: Decreased level of consciousness.  Aneurysm coiling. Previous shunt.  CT HEAD WITHOUT CONTRAST  Technique:  Contiguous axial images were obtained from the base of the skull through the vertex without contrast.  Comparison: 09/01/2011 and multiple previous  Findings: Hyperdensity related to intravascular coils at the base of the brain again noted.  Replacement of a ventricular shunt catheter through a previously placed right frontal burr hole with the catheter entering the frontal horn of the right lateral ventricle, passing through the foramen of Monro and entering the third ventricle.   There is ventricular decompression.  No evidence of additional hemorrhage.  Areas of infarction in the frontal lobes and in the region of the left genu of the corpus callosum and left basal ganglia again noted.  No new infarction.  Visualized sinuses are clear.  IMPRESSION: VP shunt well positioned with the tip entering the third ventricle. Ventricular decompression.  No hemorrhage.  Low densities in the frontal lobes, corpus callosum and left basal ganglia/radiating white matter tracts again noted.  No new brain infarctions.   Original Report Authenticated By: Thomasenia Sales, M.D.     Basename 09/09/11 0600  WBC 7.9  HGB 9.8*  HCT 32.2*  PLT 200    Basename 09/09/11 0600 09/06/11 0800  NA 155* 153*  K 3.7 3.8  CL 118* 119*  CO2 28 24  GLUCOSE 128* 169*  BUN 35* 44*  CREATININE 0.89 0.90  CALCIUM 9.7 9.7   CBG (last 3)   Basename 09/08/11 2059 09/08/11 1707 09/08/11 1147  GLUCAP 117* 103* 222*    Wt Readings from Last 3 Encounters:  09/08/11 98.158 kg (216 lb 6.4 oz)  09/07/11 98.7 kg (217 lb 9.5 oz)  09/07/11 98.7 kg (217 lb 9.5 oz)    Physical Exam:  HENT:  Staples in place to VP shunt site  Eyes:  Pupils are reactive to light and  Neck: No thyromegaly present.  Cardiovascular: Normal rate and regular rhythm.  Pulmonary/Chest: Breath sounds normal. No respiratory distress. She has no wheezes.  Abdominal: Bowel sounds are normal. She exhibits no distension. There is no tenderness.  Neurological:  Patient with flat affect and easily distracted. She was nonverbal during  exam.. She did follow commandsbut then became distracted.. She does withdrawal to deep stimulihowever sensory testing limited by her mental status  Upper extremities have 4 minus grip arm flexion extension.  Right lower extremitytrace active movement although once again not clear whether or not she is fully aware of commands she does have right knee pain with range of motion  Left lower extremity has 2  minus hip knee extensor synergy  Right ankle tender and warm. Right knee lesser so.   Assessment/Plan: 1. Functional deficits secondary to SAH/ACA aneurysm with hydrocephalus and VPS which require 3+ hours per day of interdisciplinary therapy in a comprehensive inpatient rehab setting. Physiatrist is providing close team supervision and 24 hour management of active medical problems listed below. Physiatrist and rehab team continue to assess barriers to discharge/monitor patient progress toward functional and medical goals. FIM:                                 Medical Problem List and Plan:  1. subarachnoid hemorrhage with small infarct left internal capsule: Status post IVC placement 08/18/2011 with coiling of aneurysm 08/19/2011 and VP shunt placement 09/02/2011  2. DVT Prophylaxis/Anticoagulation: SCDs. Monitor for any signs of DVT  3. Pain Management: Tylenol for now. Monitor with increased mobility. Right knee and right ankle pain we'll check x-rays likely degenerative changes, no recent trauma  4. Neuropsych: This patient is not capable of making decisions on his/her own behalf.  5. Dysphagia. Dysphagia 1 nectar thick liquids. Follow per speech therapy. Monitor for any signs of aspiration  6. Seizure prophylaxis. Keppra 500 mg twice a day  7. Hypertension. Hydralazine 20 mg 3 times a day, labetalol 200 mg 3 times a day, name of top 60 mg every 4 hours. Good control at present. 8. Hypernatremia. Followup labs  9. Hyperglycemia. Latest blood sugars [id number] No reports of diabetes mellitus. Patient presently on Lantus insulin 10 units each bedtime. Check blood sugars a.c. and at bedtime. Will check hemoglobin A1c 10. Right ankle pain: xrays of knee and ankle are unremarkable. i suspect gout  -check uric acid  -add colchicine  LOS (Days) 1 A FACE TO FACE EVALUATION WAS PERFORMED  SWARTZ,ZACHARY T 09/09/2011, 6:50 AM

## 2011-09-09 NOTE — Evaluation (Signed)
Physical Therapy Assessment and Plan  Patient Details  Name: [name] MRN: [id number] Date of Birth: 03-20-1950  PT Diagnosis: Abnormality of gait, Cognitive deficits, Difficulty walking and Muscle weakness Rehab Potential: Good ELOS: 3-4 weeks   Today's Date: 09/09/2011 Time: 800-900 60 minutes Problem List:  Patient Active Problem List  Diagnosis  . Subarachnoid hemorrhage  . Encephalopathy  . Aspiration pneumonia  . Acute respiratory failure  . HTN (hypertension)  . Vasospasm  . SAH (subarachnoid hemorrhage)    Past Medical History:  Past Medical History  Diagnosis Date  . Hypertension    Past Surgical History:  Past Surgical History  Procedure Date  . Abdominal hysterectomy   . Ventriculoperitoneal shunt 09/02/2011    Procedure: SHUNT INSERTION VENTRICULAR-PERITONEAL;  Surgeon: Maeola Harman, MD;  Location: [facility];  Service: Neurosurgery;  Laterality: Right;    Assessment & Plan Clinical Impression: Patient is a 61 y.o. year old female with recent admission to the hospital on 08/18/2011 with severe onset of headaches and became unresponsive with frothing of the mouth. In the emergency department CT scan imaging of the head showed diffuse subarachnoid hemorrhage as well as small infarct left internal capsule. chest x-ray showing aspiration pneumonia. She was subsequently intubated and later extubated after several hours. Underwent right frontal IVC placement 08/18/2011 per Dr. Venetia Maxon. 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 Underwent coiling of aneurysm 08/19/2011 per Dr. Corliss Skains. Hospital course with VP shunt placed 09/02/2011 due to enlarging ventricles and elevated intracranial pressure.  Patient transferred to CIR on 09/08/2011 .   Patient currently requires total with mobility secondary to muscle weakness, impaired timing and sequencing and abnormal tone, decreased initiation, decreased attention, decreased awareness, decreased  problem solving, decreased safety awareness, decreased memory and delayed processing and decreased sitting balance, decreased standing balance, decreased postural control and decreased balance strategies.  Prior to hospitalization, patient was independent with mobility and lived with Spouse in a House home.  Home access is 3Stairs to enter.  Patient will benefit from skilled PT intervention to maximize safe functional mobility, minimize fall risk and decrease caregiver burden for planned discharge home with 24 hour assist.  Anticipate patient will benefit from follow up [facility] at discharge.  PT - End of Session Activity Tolerance: Tolerates 30+ min activity with multiple rests Endurance Deficit: Yes PT Assessment Rehab Potential: Good Barriers to Discharge: None PT Plan PT Frequency: 1-2 X/day, 60-90 minutes Estimated Length of Stay: 3-4 weeks PT Treatment/Interventions: Ambulation/gait training;Balance/vestibular training;Discharge planning;Cognitive remediation/compensation;Functional mobility training;Therapeutic Activities;UE/LE Coordination activities;Wheelchair propulsion/positioning;Patient/family education;Stair training;Splinting/orthotics;Pain Dance movement psychotherapist reintegration;Neuromuscular re-education;Therapeutic Exercise;UE/LE Strength taining/ROM PT Recommendation Follow Up Recommendations: Home health PT  PT Evaluation Precautions/Restrictions Precautions Precautions: Fall Restrictions Weight Bearing Restrictions: No Pain Pain Assessment Pain Assessment: No/denies pain Home Living/Prior Functioning Home Living Lives With: Spouse Available Help at Discharge: Family Type of Home: House Home Access: Stairs to enter Secretary/administrator of Steps: 3 Entrance Stairs-Rails: None Prior Function Level of Independence: Independent with basic ADLs;Independent with gait;Independent with transfers;Independent with homemaking with ambulation Able  to Take Stairs?: Yes Driving: Yes  Cognition Overall Cognitive Status: Impaired Arousal/Alertness: Lethargic Attention: Sustained Sustained Attention: Impaired Sustained Attention Impairment: Functional basic;Verbal basic Awareness: Impaired Awareness Impairment: Intellectual impairment Initiating: Impaired Initiating Impairment: Verbal basic;Functional basic Safety/Judgment: Impaired Sensation Sensation Light Touch: Appears Intact Proprioception: Impaired by gross assessment Coordination Gross Motor Movements are Fluid and Coordinated: No Coordination and Movement Description: decreased initiation, impaired coordination R > L Motor  Motor Motor: Abnormal postural alignment and  control Motor - Skilled Clinical Observations: generalized weakness, decreased trunk, hip and LE strength  Mobility Bed Mobility Supine to Sit: 2: Max assist Supine to Sit Details (indicate cue type and reason): delayed initiation, assist for wt shifts, moving B LEs, sequencing, motor planning Sitting - Scoot to Edge of Bed: 2: Max assist Sitting - Scoot to [employer] of Bed Details (indicate cue type and reason): assist for wt shifts, sequencing, motor planning Transfers Sit to Stand: 2: Max assist Sit to Stand Details (indicate cue type and reason): cues for LE and UE placement, assist for forward wt shift and lifting Squat Pivot Transfers: 2: Max Designer, industrial/product Details (indicate cue type and reason): assist for wt shift, lifting assist, cues for UE and LE placement Locomotion  Ambulation Ambulation/Gait Assistance: 1: +2 Total assist Ambulation/Gait Assistance Details: attempt to take steps, when pt lifts 1 LE the other LE buckles, unable to press UEs through RW enough to prevent buckling, ambulation unsafe at this time Wheelchair Mobility Wheelchair Mobility: Yes Wheelchair Assistance: 4: Min Education officer, museum: Both upper extremities Wheelchair Parts Management: Needs  assistance Distance: 15  Trunk/Postural Assessment  Cervical Assessment Cervical Assessment: Within Functional Limits Thoracic Assessment Thoracic Assessment: Within Functional Limits Lumbar Assessment Lumbar Assessment:  (posterior tilt) Postural Control Postural Control:  (decreased strength, forward flexed in standing)  Balance Static Sitting Balance Static Sitting - Level of Assistance: 5: Stand by assistance Dynamic Sitting Balance Dynamic Sitting - Balance Support: During functional activity Dynamic Sitting - Level of Assistance: 4: Min assist Static Standing Balance Static Standing - Level of Assistance: 2: Max assist Static Standing - Comment/# of Minutes: with B UE support x 1 minute Dynamic Standing Balance Dynamic Standing - Level of Assistance: 1: +2 Total assist (pt's knees buckle with movement, require total A for balance) Extremity Assessment      RLE Assessment RLE Assessment:  (grossly 3-/5) LLE Assessment LLE Assessment:  (grossly 3/5)  See FIM for current functional status Refer to Care Plan for Long Term Goals  Recommendations for other services: None  Discharge Criteria: Patient will be discharged from PT if patient refuses treatment 3 consecutive times without medical reason, if treatment goals not met, if there is a change in medical status, if patient makes no progress towards goals or if patient is discharged from hospital.  The above assessment, treatment plan, treatment alternatives and goals were discussed and mutually agreed upon: by patient and by family  Treatment initiated during session: Sit to stand multiple attempts with cues for forward wt shift.  Pt able to stand x 60 seconds then with episode of knees buckling, requiring total A to scoot hips back on bed for safety.  Reciprocal hip scooting anterior/posterior in w/c with max-total A.  Pt with difficulty motor planning and sequencing.  Attempt gait training x 2, pt with knee buckling every  time she attempts to step, unsafe to ambulate at this time.  Adiana Smelcer 09/09/2011, 3:50 PM

## 2011-09-10 ENCOUNTER — Inpatient Hospital Stay (HOSPITAL_COMMUNITY): Payer: Medicaid Other | Admitting: Occupational Therapy

## 2011-09-10 ENCOUNTER — Inpatient Hospital Stay (HOSPITAL_COMMUNITY): Payer: Medicaid Other | Admitting: Speech Pathology

## 2011-09-10 ENCOUNTER — Inpatient Hospital Stay (HOSPITAL_COMMUNITY): Payer: Medicaid Other | Admitting: Physical Therapy

## 2011-09-10 LAB — GLUCOSE, CAPILLARY: Glucose-Capillary: 181 mg/dL — ABNORMAL HIGH (ref 70–99)

## 2011-09-10 MED ORDER — PANTOPRAZOLE SODIUM 40 MG PO PACK
40.0000 mg | PACK | Freq: Every day | ORAL | Status: DC
  Administered 2011-09-10 – 2011-10-03 (×19): 40 mg via ORAL
  Filled 2011-09-10 (×26): qty 20

## 2011-09-10 MED ORDER — METHYLPHENIDATE HCL 5 MG PO TABS
5.0000 mg | ORAL_TABLET | Freq: Two times a day (BID) | ORAL | Status: DC
  Administered 2011-09-10 – 2011-09-14 (×8): 5 mg via ORAL
  Filled 2011-09-10 (×9): qty 1

## 2011-09-10 NOTE — Progress Notes (Signed)
Subjective/Complaints: Complains of right ankle pain and swelling. No other obvious complaints per husband. Denies headache A 12 point review of systems has been performed and if not noted above is otherwise negative.  Objective: Vital Signs: Blood pressure 130/77, pulse 71, temperature 97.9 F (36.6 C), temperature source Oral, resp. rate 18, height 5\' 6"  (1.676 m), weight 89.268 kg (196 lb 12.8 oz), SpO2 97.00%. Dg Knee 1-2 Views Right  09/08/2011  *RADIOLOGY REPORT*  Clinical Data: Pain, decreased range of motion  RIGHT KNEE - 1-2 VIEW  Comparison: 12/27/2006  Findings: Normal alignment without fracture or effusion. Relatively preserved joint spaces.  No significant arthropathy. Enthesopathic change of the inferior patella margin.  No significant interval change.  IMPRESSION: No acute finding.  Stable exam   Original Report Authenticated By: Judie Petit. Ruel Favors, M.D.    Dg Ankle 2 Views Right  09/08/2011  *RADIOLOGY REPORT*  Clinical Data: Swelling, decreased range of motion  RIGHT ANKLE - 2 VIEW  Comparison: None.  Findings: Diffuse ankle swelling.  Intact malleoli, talus and calcaneus.  No fracture visualized.  Calcaneal spurring evident.  IMPRESSION: Soft tissue swelling.  No acute fracture.   Original Report Authenticated By: Judie Petit. Ruel Favors, M.D.     Basename 09/09/11 0600  WBC 7.9  HGB 9.8*  HCT 32.2*  PLT 200    Basename 09/09/11 0600  NA 155*  K 3.7  CL 118*  CO2 28  GLUCOSE 128*  BUN 35*  CREATININE 0.89  CALCIUM 9.7   CBG (last 3)   Basename 09/08/11 2059 09/08/11 1707 09/08/11 1147  GLUCAP 117* 103* 222*    Wt Readings from Last 3 Encounters:  09/09/11 89.268 kg (196 lb 12.8 oz)  09/07/11 98.7 kg (217 lb 9.5 oz)  09/07/11 98.7 kg (217 lb 9.5 oz)    Physical Exam:  HENT:  Staples in place to VP shunt site  Eyes:  Pupils are reactive to light and  Neck: No thyromegaly present.  Cardiovascular: Normal rate and regular rhythm.  Pulmonary/Chest: Breath sounds  normal. No respiratory distress. She has no wheezes.  Abdominal: Bowel sounds are normal. She exhibits no distension. There is no tenderness.  Neurological:  Patient with flat affect and easily distracted. She was nonverbal during exam.. She did follow commandsbut then became distracted.. She does withdrawal to deep stimulihowever sensory testing limited by her mental status  Upper extremities have 4 minus grip arm flexion extension.  Right lower extremitytrace active movement although once again not clear whether or not she is fully aware of commands she does have right knee pain with range of motion  Left lower extremity has 2 minus hip knee extensor synergy  Right ankle tender and warm. Right knee lesser so.   Assessment/Plan: 1. Functional deficits secondary to SAH/ACA aneurysm with hydrocephalus and VPS which require 3+ hours per day of interdisciplinary therapy in a comprehensive inpatient rehab setting. Physiatrist is providing close team supervision and 24 hour management of active medical problems listed below. Physiatrist and rehab team continue to assess barriers to discharge/monitor patient progress toward functional and medical goals. FIM: FIM - Bathing Bathing Steps Patient Completed: Chest;Abdomen Bathing: 1: Two helpers  FIM - Upper Body Dressing/Undressing Upper body dressing/undressing: 0: Wears gown/pajamas-no public clothing FIM - Lower Body Dressing/Undressing Lower body dressing/undressing: 1: Two helpers     FIM - Archivist Transfers: 0-Activity did not occur  FIM - Games developer Transfer: 1: Two helpers  FIM - Locomotion: Wheelchair Distance: 15 Locomotion:  Wheelchair: 1: Travels less than 50 ft with minimal assistance (Pt.>75%) FIM - Locomotion: Ambulation Ambulation/Gait Assistance: 1: +2 Total assist Locomotion: Ambulation: 1: Two helpers  Comprehension Comprehension Mode: Auditory Comprehension: 2-Understands basic 25 -  49% of the time/requires cueing 51 - 75% of the time  Expression Expression Mode: Verbal Expression: 1-Expresses basis less than 25% of the time/requires cueing greater than 75% of the time.  Social Interaction Social Interaction: 1-Interacts appropriately less than 25% of the time. May be withdrawn or combative.  Problem Solving Problem Solving: 1-Solves basic less than 25% of the time - needs direction nearly all the time or does not effectively solve problems and may need a restraint for safety  Memory Memory: 1-Recognizes or recalls less than 25% of the time/requires cueing greater than 75% of the time Medical Problem List and Plan:  1. subarachnoid hemorrhage with small infarct left internal capsule: Status post IVC placement 08/18/2011 with coiling of aneurysm 08/19/2011 and VP shunt placement 09/02/2011  2. DVT Prophylaxis/Anticoagulation: SCDs. Monitor for any signs of DVT  3. Pain Management: Tylenol for now. Monitor with increased mobility. Right knee and right ankle pain we'll check x-rays likely degenerative changes, no recent trauma  4. Neuropsych: This patient is not capable of making decisions on his/her own behalf.  -affect very flat. Trial ritalin bp permitting. Think this is more neuro than behavioral at this point  -mood assessment.   5. Dysphagia. Dysphagia 1 nectar thick liquids. Follow per speech therapy. Monitor for any signs of aspiration  6. Seizure prophylaxis. Keppra 500 mg twice a day  7. Hypertension. Hydralazine 20 mg 3 times a day, labetalol 200 mg 3 times a day, name of top 60 mg every 4 hours. Good control at present. 8. Hypernatremia. Followup labs  9. Hyperglycemia. Latest blood sugars [id number] No reports of diabetes mellitus. Patient presently on Lantus insulin 10 units each bedtime. Check blood sugars a.c. and at bedtime. hgb A1c elevated slight 10. Right ankle pain: xrays of knee and ankle are unremarkable. i suspect gout  -check uric acid  -added  colchicine which seems to have helped.  LOS (Days) 2 A FACE TO FACE EVALUATION WAS PERFORMED  SWARTZ,ZACHARY T 09/10/2011, 5:37 AM

## 2011-09-10 NOTE — Progress Notes (Signed)
Speech Language Pathology Daily Session Note  Patient Details  Name: [name] MRN: [id number] Date of Birth: Nov 26, 1950  Today's Date: 09/10/2011 Time: 1000-1045 Time Calculation (min): 45 min  Short Term Goals: Week 1: SLP Short Term Goal 1 (Week 1): Pt will initiate functional tasks with Mod A verbal and tactile cues.  SLP Short Term Goal 2 (Week 1): Pt will demonstrate sustained attention to a functional task for ~ 5 minutes with Mod A verbal and visual cues for redirection  SLP Short Term Goal 3 (Week 1): Pt will follow 2 step commands with Max A verbal and question cues.  SLP Short Term Goal 4 (Week 1): Pt will express wants/needs at the word level with Max A verbal and question cues.  SLP Short Term Goal 5 (Week 1): Pt will orient to place, time and situation with Max A contextual, enviornmental and semantic cues.  SLP Short Term Goal 6 (Week 1): Pt will consume Dys. 1 textures and nectar-thick liquids without overt s/s of aspiraiton with Mod A verbal cues for utilization of swallowing compensatory strategies.   Skilled Therapeutic Interventions: Treatment focus on initiation, sustained attention and problem solving. Pt participated in functional sorting task with coins. Pt required total A for initiation of task, sustained attention to task for more than 30 secs. and for problem solving with sorting from field of two.  Pt nonverbal today and utilizing facial expressions and gestures to communicate.  Pt reported fatigue.    FIM:  Comprehension Comprehension Mode: Auditory Comprehension: 3-Understands basic 50 - 74% of the time/requires cueing 25 - 50%  of the time Expression Expression Mode: Verbal Expression: 1-Expresses basis less than 25% of the time/requires cueing greater than 75% of the time. Social Interaction Social Interaction: 1-Interacts appropriately less than 25% of the time. May be withdrawn or combative. Problem Solving Problem Solving: 1-Solves basic less  than 25% of the time - needs direction nearly all the time or does not effectively solve problems and may need a restraint for safety Memory Memory: 1-Recognizes or recalls less than 25% of the time/requires cueing greater than 75% of the time FIM - Eating Eating Activity: 4: Helper occasionally scoops food on utensil  Pain No/Denies Pain  Therapy/Group: Individual Therapy  Jaemarie Hochberg 09/10/2011, 3:43 PM

## 2011-09-10 NOTE — Progress Notes (Signed)
Physical Therapy Note  Patient Details  Name: [name] MRN: [id number] Date of Birth: [birth year] Today's Date: 09/10/2011  [id number] (40 minutes) Pain: no distress noted Focus of treatment: standing tolerance to facilitate Rt knee control in stance Treatment: transfers squat/pivot max assist bed to wc /wc><mat; sit to stand from edge of mat using Maxi-Sky for support mod/max assist; standing to [employer] walker with Maxi-Sky support 3 X 5 minutes blocking RT knee in extension and assist to weight shift to right to increase weight bearing.  Kelcie Currie,JIM 09/10/2011, 8:45 AM

## 2011-09-10 NOTE — Progress Notes (Signed)
Occupational Therapy Session Notes  Patient Details  Name: [name] MRN: [id number] Date of Birth: [birth year]  Today's Date: 09/10/2011 Time: 4098-1191 Time Calculation (min): 40 min  Short Term Goals: Week 1:  OT Short Term Goal 1 (Week 1): Pt. will feed self with right hand for 75 % of meal OT Short Term Goal 2 (Week 1): Pt. will bath upper bodly with minimal assist OT Short Term Goal 3 (Week 1): Pt. will sit unsupported for 15 minutes during grooming task OT Short Term Goal 4 (Week 1): Pt. will dress LB with moderate ssist and moderate assist with balance for 1 minute.    Skilled Therapeutic Interventions/Progress Updates:   Session #1 [phone] - 40 Minutes Individual Therapy No complaints or signs of pain noticed Upon entering room patient found seated in w/c beside bed with family members present in room. Family left room for therapist to work with patient. Engaged in ADL retraining at sink level focusing on initiation, attention, verbal communication, functional use of bilateral UEs, sit/stands with total assist X2 & gait belt donned, and overall activity tolerance/endurance. At end of session left patient seated in w/c with quick release belt donned and call bell & phone within reach.  Session #2 [id number] - 45 Minutes Individual Therapy No complaints of pain Patient found seated in w/c with family present in room. Family willing and understood why it's best to leave room during therapy sessions at this time. Patient engaged in grooming tasks seated at sink in w/c focusing on initiation, attention, forward leaning, and functional use of bilateral UEs/hands. Therapist then propelled patient to dayroom for therapeutic activity focusing on patients attention and initiation. At end of session left patient seated in w/c with quick release belt donned and phone & call bell within reach.   Precautions:  Precautions Precautions: Fall Precaution Comments:  (foley catheter, rectal  tube removed) Restrictions Weight Bearing Restrictions: No  See FIM for current functional status  Nataley Bahri 09/10/2011, 12:03 PM

## 2011-09-11 ENCOUNTER — Inpatient Hospital Stay (HOSPITAL_COMMUNITY): Payer: Medicaid Other | Admitting: Occupational Therapy

## 2011-09-11 LAB — GLUCOSE, CAPILLARY
Glucose-Capillary: 135 mg/dL — ABNORMAL HIGH (ref 70–99)
Glucose-Capillary: 204 mg/dL — ABNORMAL HIGH (ref 70–99)

## 2011-09-11 LAB — URINE CULTURE: Colony Count: >=100000

## 2011-09-11 MED ORDER — MEGESTROL ACETATE 400 MG/10ML PO SUSP
400.0000 mg | Freq: Two times a day (BID) | ORAL | Status: DC
  Administered 2011-09-11 – 2011-10-04 (×40): 400 mg via ORAL
  Filled 2011-09-11 (×52): qty 10

## 2011-09-11 NOTE — Progress Notes (Signed)
Occupational Therapy Session Note  Patient Details  Name: [name] MRN: [id number] Date of Birth: 1950/03/15  Today's Date: 09/11/2011 Time: 4098-1191 Time Calculation (min): 45 min  Short Term Goals: Week 1:  OT Short Term Goal 1 (Week 1): Pt. will feed self with right hand for 75 % of meal OT Short Term Goal 2 (Week 1): Pt. will bath upper bodly with minimal assist OT Short Term Goal 3 (Week 1): Pt. will sit unsupported for 15 minutes during grooming task OT Short Term Goal 4 (Week 1): Pt. will dress LB with moderate ssist and moderate assist with balance for 1 minute.    Skilled Therapeutic Interventions/Progress Updates:  Patient resting in bed upon arrival with daughter at bedside.  When asked who was seated in the corner, patient responded with "my sister".  Patient's daughter left for church after this clinician arrived.  Self care retraining to include bath, dress and safe transfer bed>w/c.  Focused session on task initiation and task completion, activity tolerance, increased use of LUE & LLE, dynamic sitting, and safe squat pivot transfers.  Patient sat unsupported at EOB for UB bath and dress with supervision and required multiple cues to engage in conversation.  Patient occasionally answered with a nod and one time with a 4 word sentence.  Therapy Documentation Precautions:  Precautions Precautions: Fall Precaution Comments:  (foley catheter, rectal tube removed) Restrictions Weight Bearing Restrictions: No Pain: Denies pain yet when therapist ranges RLE for mobility with BADL, patient moaned and grimaced.  Repositioned RLE for relief. RN aware See FIM for current functional status  Therapy/Group: Individual Therapy  Magally Vahle 09/11/2011, 10:30 AM

## 2011-09-11 NOTE — Progress Notes (Signed)
Subjective/Complaints: Slept well . No new issues overnight. Appetite poor A 12 point review of systems has been performed and if not noted above is otherwise negative.  Objective: Vital Signs: Blood pressure 112/65, pulse 69, temperature 98.1 F (36.7 C), temperature source Oral, resp. rate 20, height 5\' 6"  (1.676 m), weight 89.5 kg (197 lb 5 oz), SpO2 97.00%. No results found.  Basename 09/09/11 0600  WBC 7.9  HGB 9.8*  HCT 32.2*  PLT 200    Basename 09/09/11 0600  NA 155*  K 3.7  CL 118*  CO2 28  GLUCOSE 128*  BUN 35*  CREATININE 0.89  CALCIUM 9.7   CBG (last 3)   Basename 09/10/11 2239 09/10/11 1644 09/08/11 2059  GLUCAP 181* 126* 117*    Wt Readings from Last 3 Encounters:  09/11/11 89.5 kg (197 lb 5 oz)  09/07/11 98.7 kg (217 lb 9.5 oz)  09/07/11 98.7 kg (217 lb 9.5 oz)    Physical Exam:  HENT:  Staples in place to VP shunt site  Eyes:  Pupils are reactive to light and  Neck: No thyromegaly present.  Cardiovascular: Normal rate and regular rhythm.  Pulmonary/Chest: Breath sounds normal. No respiratory distress. She has no wheezes.  Abdominal: Bowel sounds are normal. She exhibits no distension. There is no tenderness.  Neurological:  Patient with flat affect and easily distracted. She was nonverbal during exam.. She did follow commandsbut then became distracted.. She does withdrawal to deep stimulihowever sensory testing limited by her mental status  Upper extremities have 4 minus grip arm flexion extension.  Right lower extremitytrace active movement although once again not clear whether or not she is fully aware of commands she does have right knee pain with range of motion  Left lower extremity has 2 minus hip knee extensor synergy  Right ankle less tender and warm. Right knee improved   Assessment/Plan: 1. Functional deficits secondary to SAH/ACA aneurysm with hydrocephalus and VPS which require 3+ hours per day of interdisciplinary therapy in a  comprehensive inpatient rehab setting. Physiatrist is providing close team supervision and 24 hour management of active medical problems listed below. Physiatrist and rehab team continue to assess barriers to discharge/monitor patient progress toward functional and medical goals. FIM: FIM - Bathing Bathing Steps Patient Completed: Chest;Abdomen Bathing: 1: Two helpers  FIM - Upper Body Dressing/Undressing Upper body dressing/undressing steps patient completed: Thread/unthread right sleeve of pullover shirt/dresss;Thread/unthread left sleeve of pullover shirt/dress;Put head through opening of pull over shirt/dress;Pull shirt over trunk Upper body dressing/undressing: 3: Mod-Patient completed 50-74% of tasks FIM - Lower Body Dressing/Undressing Lower body dressing/undressing: 1: Two helpers  FIM - Toileting Toileting: 0: Activity did not occur  FIM - Archivist Transfers: 0-Activity did not occur  FIM - Games developer Transfer: 1: Two helpers  FIM - Locomotion: Wheelchair Distance: 15 Locomotion: Wheelchair: 1: Travels less than 50 ft with minimal assistance (Pt.>75%) FIM - Locomotion: Ambulation Ambulation/Gait Assistance: 1: +2 Total assist Locomotion: Ambulation: 1: Two helpers  Comprehension Comprehension Mode: Auditory Comprehension: 3-Understands basic 50 - 74% of the time/requires cueing 25 - 50%  of the time  Expression Expression Mode: Verbal Expression: 1-Expresses basis less than 25% of the time/requires cueing greater than 75% of the time.  Social Interaction Social Interaction: 1-Interacts appropriately less than 25% of the time. May be withdrawn or combative.  Problem Solving Problem Solving: 1-Solves basic less than 25% of the time - needs direction nearly all the time or does not effectively solve  problems and may need a restraint for safety  Memory Memory: 1-Recognizes or recalls less than 25% of the time/requires cueing greater  than 75% of the time Medical Problem List and Plan:  1. subarachnoid hemorrhage with small infarct left internal capsule: Status post IVC placement 08/18/2011 with coiling of aneurysm 08/19/2011 and VP shunt placement 09/02/2011  2. DVT Prophylaxis/Anticoagulation: SCDs. Monitor for any signs of DVT  3. Pain Management: Tylenol for now. Monitor with increased mobility. Right knee and right ankle pain we'll check x-rays likely degenerative changes, no recent trauma  4. Neuropsych: This patient is not capable of making decisions on his/her own behalf.  -affect very flat. Trial ritalin initiated and no bp elevation thus far. Think this is more neuro than behavioral at this point  -mood assessment.   5. Dysphagia. Dysphagia 1 nectar thick liquids. Follow per speech therapy. Monitor for any signs of aspiration  6. Seizure prophylaxis. Keppra 500 mg twice a day  7. Hypertension. Hydralazine 20 mg 3 times a day, labetalol 200 mg 3 times a day, name of top 60 mg every 4 hours. Good control at present. 8. Hypernatremia. Followup labs  9. Hyperglycemia. Latest blood sugars 175-82-115 No reports of diabetes mellitus. Patient presently on Lantus insulin 10 units each bedtime. Check blood sugars a.c. and at bedtime. hgb A1c elevated slight 10. Right ankle pain: gout  - uric acid level elevated  -added colchicine which seems to have helped. 11.FEN: megace trial.  -check labs in am  LOS (Days) 3 A FACE TO FACE EVALUATION WAS PERFORMED  SWARTZ,ZACHARY T 09/11/2011, 6:19 AM

## 2011-09-12 ENCOUNTER — Inpatient Hospital Stay (HOSPITAL_COMMUNITY): Payer: Medicaid Other | Admitting: Occupational Therapy

## 2011-09-12 ENCOUNTER — Inpatient Hospital Stay (HOSPITAL_COMMUNITY): Payer: Medicaid Other | Admitting: Speech Pathology

## 2011-09-12 ENCOUNTER — Inpatient Hospital Stay (HOSPITAL_COMMUNITY): Payer: Medicaid Other

## 2011-09-12 ENCOUNTER — Inpatient Hospital Stay (HOSPITAL_COMMUNITY): Payer: Medicaid Other | Admitting: Physical Therapy

## 2011-09-12 DIAGNOSIS — I609 Nontraumatic subarachnoid hemorrhage, unspecified: Secondary | ICD-10-CM

## 2011-09-12 DIAGNOSIS — Z5189 Encounter for other specified aftercare: Secondary | ICD-10-CM

## 2011-09-12 LAB — GLUCOSE, CAPILLARY
Glucose-Capillary: 146 mg/dL — ABNORMAL HIGH (ref 70–99)
Glucose-Capillary: 148 mg/dL — ABNORMAL HIGH (ref 70–99)

## 2011-09-12 LAB — BASIC METABOLIC PANEL
BUN: 38 mg/dL — ABNORMAL HIGH (ref 6–23)
CO2: 26 mEq/L (ref 19–32)
Calcium: 10.1 mg/dL (ref 8.4–10.5)
GFR calc non Af Amer: 61 mL/min — ABNORMAL LOW (ref >90)
Glucose, Bld: 138 mg/dL — ABNORMAL HIGH (ref 70–99)
Potassium: 3.7 mEq/L (ref 3.5–5.1)

## 2011-09-12 MED ORDER — SODIUM CHLORIDE 0.45 % IV SOLN: INTRAVENOUS | Status: DC

## 2011-09-12 NOTE — Progress Notes (Signed)
Patient refused 0400 dose Nymalize this am.  She held med in mouth for one hour before spitting out.  Husband at bedside.

## 2011-09-12 NOTE — Progress Notes (Signed)
[facility] Individual Statement of Services  Patient Name:  [name]  Date:  09/12/2011  Welcome to the [facility].  Our goal is to provide you with an individualized program based on your diagnosis and situation, designed to meet your specific needs.  With this comprehensive rehabilitation program, you will be expected to participate in at least 3 hours of rehabilitation therapies Monday-Friday, with modified therapy programming on the weekends.  Your rehabilitation program will include the following services:  Physical Therapy (PT), Occupational Therapy (OT), Speech Therapy (ST), 24 hour per day rehabilitation nursing, Therapeutic Recreaction (TR), Neuropsychology, Case Management (Social Worker), Rehabilitation Medicine, Nutrition Services and Pharmacy Services  Weekly team conferences will be held on Tuesdays to discuss your progress.  Your Social Worker will talk with you frequently to get your input and to update you on team discussions.  Team conferences with you and your family in attendance may also be held.  Expected length of stay: 3-4 weeks  Overall anticipated outcome: minimal assistance  Depending on your progress and recovery, your program may change.  Your  Social Worker will coordinate services and will keep you informed of any changes.  Your  Social Worker's name and contact numbers are listed  below.  The following services may also be recommended but are not provided by the [facility]:   Driving Evaluations  Home Health Rehabiltiation Services  Outpatient Rehabilitatation [facility]  Vocational Rehabilitation   Arrangements will be made to provide these services after discharge if needed.  Arrangements include referral to agencies that provide these services.  Your insurance has been verified to be:  Medicaid application pending Your primary doctor is:  None  Pertinent information will be shared  with your doctor and your insurance company.  Social Worker:  [place], [place] [phone] or [phone]  Information discussed with and copy given to patient by: Amada Jupiter, 09/12/2011, 9:31 AM

## 2011-09-12 NOTE — Progress Notes (Signed)
Occupational Therapy Session Note  Patient Details  Name: [name] MRN: [id number] Date of Birth: Jul 02, 1950  Today's Date: 09/12/2011 Time: 1100-1200 Time Calculation (min): 60 min  Short Term Goals: Week 1:  OT Short Term Goal 1 (Week 1): Pt. will feed self with right hand for 75 % of meal OT Short Term Goal 2 (Week 1): Pt. will bath upper bodly with minimal assist OT Short Term Goal 3 (Week 1): Pt. will sit unsupported for 15 minutes during grooming task OT Short Term Goal 4 (Week 1): Pt. will dress LB with moderate ssist and moderate assist with balance for 1 minute.    Skilled Therapeutic Interventions/Progress Updates:    1:1 self care retraining at sink level including bathing and dressing. Pt asleep when came in and required max tactile cues to become alert and ready for participation. Focus on transitional mobility to come to EOB, sit EOB, stand pivot transfer into w/c. Pt only required min A with transitional mobility, demonstrating increased attention to task, alertness and awareness of current situation. Pt not oriented to place, time or situation. Focus on basic simple problem solving, functional communication, following 1 step directions with cuing and context cues from environment, sit to stand, standing balance. Pt does well with functional, automatic tasks.  Therapy Documentation Precautions:  Precautions Precautions: Fall Precaution Comments:  (foley catheter, rectal tube removed) Restrictions Weight Bearing Restrictions: No Pain: Pain Assessment Pain Assessment: No/denies pain Pain Score: 0-No pain  See FIM for current functional status  Therapy/Group: Individual Therapy  Roney Mans [facility] 09/12/2011, 12:50 PM

## 2011-09-12 NOTE — Progress Notes (Signed)
Physical Therapy Note  Patient Details  Name: [name] MRN: [id number] Date of Birth: 11/25/1950 Today's Date: 09/12/2011  Time: 800-842 42 minutes  No signs/symptoms of pain.  Pt with increased lethargy, decreased attention this session.  Required max cuing for initiation and motor planning through all functional tasks.  Treatment focused on transfer training bed <> w/c and scooting edge of bed.  Pt required total A, decreased mm activation and participation with transfers and mobility today.  Pt able to initiate forward lean consistently but unable to engage UE or LE musculature for scoot pivot transfers.  Pt able to scoot anterior/posterior on mat and in w/c with max A.  Limited by decreased sustained attention to task <15 seconds.  Individual therapy   DONAWERTH,KAREN 09/12/2011, 8:42 AM
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
## 2011-09-12 NOTE — Progress Notes (Signed)
Occupational Therapy Session Note  Patient Details  Name: [name] MRN: [id number] Date of Birth: [birth year]  Today's Date: 09/12/2011 Time: 1330-1400 Time Calculation (min): 30 min  Short Term Goals: Week 1:  OT Short Term Goal 1 (Week 1): Pt. will feed self with right hand for 75 % of meal OT Short Term Goal 2 (Week 1): Pt. will bath upper bodly with minimal assist OT Short Term Goal 3 (Week 1): Pt. will sit unsupported for 15 minutes during grooming task OT Short Term Goal 4 (Week 1): Pt. will dress LB with moderate ssist and moderate assist with balance for 1 minute.    Skilled Therapeutic Interventions/Progress Updates:    1:1 Pt alert and awake when arrived in room. Took pt down to ADL kitchen to assist with grocery list and obtaining items from cabinet. Pt able to perform sit to stand 2x for 4-5 min each while obtaining items from high cabinet with min to mod A. Pt able to read labels on cans/boxes with min cuing and therapist pointing to word. Pt wrote down number of items needs under catagory on grocery list with visual cues. Pt unable to match similar labeled cans but able to spontaneously count 1-6 with therapist. Pt then became incontinent of bowel and returned to room to perform Stand step transfer to toilet.  Therapy Documentation Precautions:  Precautions Precautions: Fall Precaution Comments:  (foley catheter, rectal tube removed) Restrictions Weight Bearing Restrictions: No Pain:  no c/o pain  See FIM for current functional status  Therapy/Group: Individual Therapy  Roney Mans [facility] 09/12/2011, 3:40 PM

## 2011-09-12 NOTE — Progress Notes (Signed)
Subjective/Complaints: Difficult to arouse this am. Appears to have been slow yesterday by chart documentation A 12 point review of systems has been performed and if not noted above is otherwise negative.  Objective: Vital Signs: Blood pressure 161/84, pulse 67, temperature 97.8 F (36.6 C), temperature source Oral, resp. rate 20, height 5\' 6"  (1.676 m), weight 83.2 kg (183 lb 6.8 oz), SpO2 100.00%. No results found. No results found for this basename: WBC:2,HGB:2,HCT:2,PLT:2 in the last 72 hours  Basename 09/12/11 0535  NA 160*  K 3.7  CL 124*  CO2 26  GLUCOSE 138*  BUN 38*  CREATININE 0.97  CALCIUM 10.1   CBG (last 3)   Basename 09/11/11 2135 09/11/11 1637 09/11/11 1125  GLUCAP 211* 124* 204*    Wt Readings from Last 3 Encounters:  09/12/11 83.2 kg (183 lb 6.8 oz)  09/07/11 98.7 kg (217 lb 9.5 oz)  09/07/11 98.7 kg (217 lb 9.5 oz)    Physical Exam:  HENT:  Staples in place to VP shunt site  Eyes:  Pupils are reactive to light and  Neck: No thyromegaly present.  Cardiovascular: Normal rate and regular rhythm.  Pulmonary/Chest: Breath sounds normal. No respiratory distress. She has no wheezes.  Abdominal: Bowel sounds are normal. She exhibits no distension. There is no tenderness.  Neurological:  Patient with flat affect , eyes close frequently. She was nonverbal during exam.. Very slow to process. . She does withdrawal to deep stimulihowever sensory testing limited by her mental status  Upper extremities have 4 minus grip arm flexion extension.  Right lower extremitytrace active movement although once again not clear whether or not she is fully aware of commands she does have right knee pain with range of motion  Left lower extremity has 2 minus hip knee extensor synergy  Right ankle less tender and warm. Right knee improved   Assessment/Plan: 1. Functional deficits secondary to SAH/ACA aneurysm with hydrocephalus and VPS which require 3+ hours per day of  interdisciplinary therapy in a comprehensive inpatient rehab setting. Physiatrist is providing close team supervision and 24 hour management of active medical problems listed below. Physiatrist and rehab team continue to assess barriers to discharge/monitor patient progress toward functional and medical goals. FIM: FIM - Bathing Bathing Steps Patient Completed: Chest;Abdomen (performed portions of bath) Bathing: 1: Total-Patient completes 0-2 of 10 parts or less than 25%  FIM - Upper Body Dressing/Undressing Upper body dressing/undressing steps patient completed: Thread/unthread right sleeve of pullover shirt/dresss;Thread/unthread left sleeve of pullover shirt/dress;Put head through opening of pull over shirt/dress;Pull shirt over trunk Upper body dressing/undressing: 5: Set-up assist to: Obtain clothing/put away FIM - Lower Body Dressing/Undressing Lower body dressing/undressing steps patient completed: Thread/unthread left pants leg;Don/Doff left sock Lower body dressing/undressing: 1: Total-Patient completed less than 25% of tasks  FIM - Toileting Toileting: 0: Activity did not occur  FIM - Archivist Transfers: 0-Activity did not occur  FIM - Games developer Transfer: 1: Two helpers  FIM - Locomotion: Wheelchair Distance: 15 Locomotion: Wheelchair: 1: Travels less than 50 ft with minimal assistance (Pt.>75%) FIM - Locomotion: Ambulation Ambulation/Gait Assistance: 1: +2 Total assist Locomotion: Ambulation: 1: Two helpers  Comprehension Comprehension Mode: Auditory Comprehension: 3-Understands basic 50 - 74% of the time/requires cueing 25 - 50%  of the time  Expression Expression Mode: Verbal Expression: 1-Expresses basis less than 25% of the time/requires cueing greater than 75% of the time.  Social Interaction Social Interaction: 1-Interacts appropriately less than 25% of the time. May be  withdrawn or combative.  Problem Solving Problem  Solving: 1-Solves basic less than 25% of the time - needs direction nearly all the time or does not effectively solve problems and may need a restraint for safety  Memory Memory: 1-Recognizes or recalls less than 25% of the time/requires cueing greater than 75% of the time Medical Problem List and Plan:  1. subarachnoid hemorrhage with small infarct left internal capsule: Status post IVC placement 08/18/2011 with coiling of aneurysm 08/19/2011 and VP shunt placement 09/02/2011  2. DVT Prophylaxis/Anticoagulation: SCDs. Monitor for any signs of DVT  3. Pain Management: Tylenol for now. Monitor with increased mobility. Right knee and right ankle pain we'll check x-rays likely degenerative changes, no recent trauma  4. Neuropsych: This patient is not capable of making decisions on his/her own behalf.  -affect very flat. Trial ritalin initiated and no bp elevation thus far. Think this is more neuro than behavioral at this point  -mood assessment.    -rx hypernatremia  -recheck urine and head CT today 5. Dysphagia. Dysphagia 1 nectar thick liquids. Follow per speech therapy. Monitor for any signs of aspiration  6. Seizure prophylaxis. Keppra 500 mg twice a day  7. Hypertension. Hydralazine 20 mg 3 times a day, labetalol 200 mg 3 times a day, name of top 60 mg every 4 hours. Good control at present. 8. Hypernatremia. Followup labs reveal increased Na+. Initiate 1/2NS  9. Hyperglycemia. Latest blood sugars 175-82-115 No reports of diabetes mellitus. Patient presently on Lantus insulin 10 units each bedtime. Check blood sugars a.c. and at bedtime. hgb A1c elevated slight 10. Right ankle pain: gout  - uric acid level elevated  -added colchicine which seems to have helped. 11.FEN: megace trial.  -check labs in am  LOS (Days) 4 A FACE TO FACE EVALUATION WAS PERFORMED  SWARTZ,ZACHARY T 09/12/2011, 7:23 AM

## 2011-09-12 NOTE — Progress Notes (Signed)
Speech Language Pathology Daily Session Note  Patient Details  Name: [name] MRN: [id number] Date of Birth: 04/14/1950  Today's Date: 09/12/2011 Time: 0900-0925 Time Calculation (min): 25 min  Short Term Goals: Week 1: SLP Short Term Goal 1 (Week 1): Pt will initiate functional tasks with Mod A verbal and tactile cues.  SLP Short Term Goal 2 (Week 1): Pt will demonstrate sustained attention to a functional task for ~ 5 minutes with Mod A verbal and visual cues for redirection  SLP Short Term Goal 3 (Week 1): Pt will follow 2 step commands with Max A verbal and question cues.  SLP Short Term Goal 4 (Week 1): Pt will express wants/needs at the word level with Max A verbal and question cues.  SLP Short Term Goal 5 (Week 1): Pt will orient to place, time and situation with Max A contextual, enviornmental and semantic cues.  SLP Short Term Goal 6 (Week 1): Pt will consume Dys. 1 textures and nectar-thick liquids without overt s/s of aspiraiton with Mod A verbal cues for utilization of swallowing compensatory strategies.   Skilled Therapeutic Interventions: Treatment focus on initiation and sustained attention. Pt required Max A verbal  and visual cues as well as extra time to initiate meal. Pt then initiated 4 consecutive sips of liquids via spoon (difficult to determine initiation vs. perseveration of liquids).  Pt demonstrated increased swallow initiation and no oral holding was noted. Session ended early due to CT scan.    FIM:  Comprehension Comprehension: 3-Understands basic 50 - 74% of the time/requires cueing 25 - 50%  of the time Expression Expression Mode: Verbal Expression: 1-Expresses basis less than 25% of the time/requires cueing greater than 75% of the time. Social Interaction Social Interaction: 1-Interacts appropriately less than 25% of the time. May be withdrawn or combative. Problem Solving Problem Solving: 1-Solves basic less than 25% of the time - needs direction  nearly all the time or does not effectively solve problems and may need a restraint for safety Memory Memory: 1-Recognizes or recalls less than 25% of the time/requires cueing greater than 75% of the time FIM - Eating Eating Activity: 4: Helper occasionally scoops food on utensil  Pain Pain Assessment Pain Assessment: No/denies pain  Therapy/Group: Individual Therapy  Clois Montavon 09/12/2011, 10:55 AM

## 2011-09-13 ENCOUNTER — Inpatient Hospital Stay (HOSPITAL_COMMUNITY): Payer: Medicaid Other | Admitting: Physical Therapy

## 2011-09-13 ENCOUNTER — Inpatient Hospital Stay (HOSPITAL_COMMUNITY): Payer: Medicaid Other | Admitting: Occupational Therapy

## 2011-09-13 ENCOUNTER — Inpatient Hospital Stay (HOSPITAL_COMMUNITY): Payer: Medicaid Other | Admitting: Speech Pathology

## 2011-09-13 DIAGNOSIS — E870 Hyperosmolality and hypernatremia: Secondary | ICD-10-CM

## 2011-09-13 DIAGNOSIS — Z5189 Encounter for other specified aftercare: Secondary | ICD-10-CM

## 2011-09-13 DIAGNOSIS — I609 Nontraumatic subarachnoid hemorrhage, unspecified: Secondary | ICD-10-CM

## 2011-09-13 LAB — BASIC METABOLIC PANEL
BUN: 34 mg/dL — ABNORMAL HIGH (ref 6–23)
Chloride: 127 mEq/L — ABNORMAL HIGH (ref 96–112)
GFR calc Af Amer: 75 mL/min — ABNORMAL LOW (ref >90)
GFR calc non Af Amer: 65 mL/min — ABNORMAL LOW (ref >90)
Potassium: 3.2 mEq/L — ABNORMAL LOW (ref 3.5–5.1)
Sodium: 161 mEq/L (ref 135–145)

## 2011-09-13 LAB — GLUCOSE, CAPILLARY: Glucose-Capillary: 102 mg/dL — ABNORMAL HIGH (ref 70–99)

## 2011-09-13 MED ORDER — POTASSIUM CHLORIDE IN NACL 20-0.45 MEQ/L-% IV SOLN
INTRAVENOUS | Status: DC
  Administered 2011-09-13 – 2011-09-16 (×6): via INTRAVENOUS
  Filled 2011-09-13 (×12): qty 1000

## 2011-09-13 NOTE — Discharge Summary (Signed)
Physician Discharge Summary  Patient ID: [name] MRN: [id number] DOB/AGE: 03/02/1950 61 y.o.  LATE ENTRY 09/13/11 for 09/08/11  Admit date: 09/08/2011 Discharge date: 09/08/2011  Admission Diagnoses: SAH with Hydrocephalus, Anterior Communicating Artery Aneurysm  Discharge Diagnoses: SAH with Hydrocephalus, ACA aneurysm; s/p Ventriculostomy, ACA coiling, and ventriculoperitonel shunt placement  Principal Problem:  *SAH (subarachnoid hemorrhage)   Discharged Condition: fair  Hospital Course: [name] is a 61 year old female transferred from [place] with subarachnoid hemorrhage with hydrocephalus. She had reportedly experienced severe headaches 08-16-11 followed by loss of consciousness. Supportive care had been initiated. She was transferred for intervention when her status began to improve. ACA aneurysm identified, with SAH & hydrocephalus. IVC placed by Dr. Fawna Cranmer on 08/18/11. ACAA coiled by Dr. Deveshwar on 08/19/11. Stable in ICU throughout. IVC inadvertently pulled during CT scan on 08/28/11. She remained stable, though by 09/01/11 CT demonstrated enlarging ventricles requiring ventriculoperitoneal shunt. VP shunt was placed by Dr. Smrithi Pigford & Dr. Rosenbower on 09/02/11. She progressed slowly thereafter, transferring to [facility] from [facility]. Episodic lethargy/sleep/alertness thought to be r/t sleep patterns in years past. Transfer to CIR for intense rehabilitation.  Consults: CCM, General Surgery  Significant Diagnostic Studies: radiology: CT scan:   Treatments: surgery: IVC 08/18/11, ACAA coiling 08/19/11, VP Shunt 09/02/11  Discharge Exam: 09/08/11 Awakens to touch, albeit briefly. Opens eyes on command after awakened and moves extremities. Quickly fall back to sleep. PEARL. Incisions without erythema, swelling, or drainage. Belly soft, without evidence of discomfort with palpation. Foley & Flexiseal patent.    Due to varied levels of responsiveness/cooperation/activity over the last  few days paired with improved head CT s/p shunt placement makes the possibility of sleep cycle changes seems valid. Hopeful of increased interaction and participation with CIR assessment today.    Disposition: [facility]-Rehab Facility  Discharge Orders    Future Appointments: Provider: Department: Dept Phone: Center:   09/14/2011 8:15 AM Jennifer L Smith, OT [facility] [phone] None   09/14/2011 9:30 AM Cole M Kampen, PT [facility] [phone] None   09/14/2011 11:00 AM Courtney M Payne, CCC-SLP [facility] [phone] None   09/14/2011 1:45 PM Jennifer L Smith, OT [facility] [phone] None     Medication List  As of 09/13/2011  2:58 PM   ASK your doctor about these medications         cloNIDine 0.1 MG tablet   Commonly known as: CATAPRES   Take 0.1 mg by mouth 2 (two) times daily.      hydrochlorothiazide 25 MG tablet   Commonly known as: HYDRODIURIL   Take 1 tablet (25 mg total) by mouth daily.      ibuprofen 800 MG tablet   Commonly known as: ADVIL,MOTRIN   Take 800 mg by mouth every 8 (eight) hours as needed. Pain      morphine 20 MG/ML concentrated solution   Commonly known as: ROXANOL   Take 0.5-1 mLs (10-20 mg total) by mouth every 2 (two) hours as needed for pain.             Signed: Poteat, Brian 09/13/2011, 2:58 PMLATE ENTRY 09/13/11 for 09/08/11   

## 2011-09-13 NOTE — Progress Notes (Signed)
Speech Language Pathology Daily Session Note  Patient Details  Name: [name] MRN: [id number] Date of Birth: 06-11-1950  Today's Date: 09/13/2011 Time: 2130-8657 Time Calculation (min): 45 min  Short Term Goals: Week 1: SLP Short Term Goal 1 (Week 1): Pt will initiate functional tasks with Mod A verbal and tactile cues.  SLP Short Term Goal 2 (Week 1): Pt will demonstrate sustained attention to a functional task for ~ 5 minutes with Mod A verbal and visual cues for redirection  SLP Short Term Goal 3 (Week 1): Pt will follow 2 step commands with Max A verbal and question cues.  SLP Short Term Goal 4 (Week 1): Pt will express wants/needs at the word level with Max A verbal and question cues.  SLP Short Term Goal 5 (Week 1): Pt will orient to place, time and situation with Max A contextual, enviornmental and semantic cues.  SLP Short Term Goal 6 (Week 1): Pt will consume Dys. 1 textures and nectar-thick liquids without overt s/s of aspiraiton with Mod A verbal cues for utilization of swallowing compensatory strategies.   Skilled Therapeutic Interventions: Treatment focus on initiation of verbal expression and reading comprehension.  Pt required Max A verbal, visual and demonstration cues with extra time to initiate oral reading of sentences and for reading comprehension (matching sentences to pictures). Pt also required Max A verbal, visual and tactile cues for sustained attention to task for ~ 1 minute.  Pt without spontaneous verbalizations or social responses and utilizing facial expressions and gestures for communication.    FIM:  Comprehension Comprehension Mode: Auditory Comprehension: 3-Understands basic 50 - 74% of the time/requires cueing 25 - 50%  of the time Expression Expression Mode: Verbal Expression: 1-Expresses basis less than 25% of the time/requires cueing greater than 75% of the time. Social Interaction Social Interaction: 1-Interacts appropriately less than 25% of  the time. May be withdrawn or combative. Problem Solving Problem Solving: 1-Solves basic less than 25% of the time - needs direction nearly all the time or does not effectively solve problems and may need a restraint for safety Memory Memory: 1-Recognizes or recalls less than 25% of the time/requires cueing greater than 75% of the time  Pain Pain Assessment Pain Assessment: No/denies pain  Therapy/Group: Individual Therapy  Brixon Zhen 09/13/2011, 4:16 PM

## 2011-09-13 NOTE — Progress Notes (Signed)
Subjective/Complaints: No new issues. Denies pain. A 12 point review of systems has been performed and if not noted above is otherwise negative.  Objective: Vital Signs: Blood pressure 117/78, pulse 70, temperature 97.9 F (36.6 C), temperature source Oral, resp. rate 19, height 5\' 6"  (1.676 m), weight 83.2 kg (183 lb 6.8 oz), SpO2 99.00%. Ct Head Wo Contrast  09/12/2011  *RADIOLOGY REPORT*  Clinical Data: Increasing lethargy.  Previous subarachnoid hemorrhage with hydrocephalus and vasospasm.  CT HEAD WITHOUT CONTRAST  Technique:  Contiguous axial images were obtained from the base of the skull through the vertex without contrast.  Comparison: Multiple priors, most recent 09/07/2011  Findings: Subacute areas of ischemia/infarction affect both frontal lobes, left corpus callosum, and left basal ganglia.  No new areas of infarction are observed.  No new hemorrhage.  Ventricular catheter position is unchanged, from right frontal approach with tip in the third ventricle slightly crossing the midline to the left.  No evidence for hydrocephalus.  No significant extra-axial fluid collection.  Calvarium intact other than the right frontal burr hole.  Aneurysm coil mass unchanged.  No acute sinus or mastoid disease.  IMPRESSION: Stable exam.  No evidence for worsening infarction, new hemorrhage, or hydrocephalus.   Original Report Authenticated By: Elsie Stain, M.D.    No results found for this basename: WBC:2,HGB:2,HCT:2,PLT:2 in the last 72 hours  Basename 09/13/11 0451 09/12/11 0535  NA 161* 160*  K 3.2* 3.7  CL 127* 124*  CO2 24 26  GLUCOSE 160* 138*  BUN 34* 38*  CREATININE 0.92 0.97  CALCIUM 9.6 10.1   CBG (last 3)   Basename 09/12/11 2114 09/12/11 1632 09/12/11 1148  GLUCAP 148* 146* 181*    Wt Readings from Last 3 Encounters:  09/12/11 83.2 kg (183 lb 6.8 oz)  09/07/11 98.7 kg (217 lb 9.5 oz)  09/07/11 98.7 kg (217 lb 9.5 oz)    Physical Exam:  HENT: Eyes:  Pupils are reactive  to light and  Neck: No thyromegaly present.  Cardiovascular: Normal rate and regular rhythm.  Pulmonary/Chest: Breath sounds normal. No respiratory distress. She has no wheezes.  Abdominal: Bowel sounds are normal. She exhibits no distension. There is no tenderness.  Neurological:  Patient with flat affec. A little more alert today. She was nonverbal during exam.. Very slow to process. . She does withdrawal to deep stimulihowever sensory testing limited by her mental status  Upper extremities have 4 minus grip arm flexion extension.  Right lower extremitytrace active movement although once again not clear whether or not she is fully aware of commands she does have right knee pain with range of motion  Left lower extremity has 2 minus hip knee extensor synergy  Right ankle less tender and warm. Right knee improved Skin intact   Assessment/Plan: 1. Functional deficits secondary to SAH/ACA aneurysm with hydrocephalus and VPS which require 3+ hours per day of interdisciplinary therapy in a comprehensive inpatient rehab setting. Physiatrist is providing close team supervision and 24 hour management of active medical problems listed below. Physiatrist and rehab team continue to assess barriers to discharge/monitor patient progress toward functional and medical goals. FIM: FIM - Bathing Bathing Steps Patient Completed: Chest;Abdomen;Right Arm;Left Arm;Right upper leg;Left upper leg (performed portions of bath) Bathing: 3: Mod-Patient completes 5-7 28f 10 parts or 50-74%  FIM - Upper Body Dressing/Undressing Upper body dressing/undressing steps patient completed: Thread/unthread right sleeve of pullover shirt/dresss;Thread/unthread left sleeve of pullover shirt/dress;Put head through opening of pull over shirt/dress;Pull shirt over trunk  Upper body dressing/undressing: 5: Set-up assist to: Obtain clothing/put away FIM - Lower Body Dressing/Undressing Lower body dressing/undressing steps patient  completed: Thread/unthread left pants leg;Don/Doff left sock;Don/Doff right sock;Don/Doff left shoe;Fasten/unfasten left shoe Lower body dressing/undressing: 2: Max-Patient completed 25-49% of tasks  FIM - Toileting Toileting: 0: No continent bowel/bladder events this shift  FIM - Archivist Transfers: 0-Activity did not occur;4-To toilet/BSC: Min A (steadying Pt. > 75%);4-From toilet/BSC: Min A (steadying Pt. > 75%)  FIM - Bed/Chair Transfer Bed/Chair Transfer: 4: Supine > Sit: Min A (steadying Pt. > 75%/lift 1 leg);4: Bed > Chair or W/C: Min A (steadying Pt. > 75%);4: Chair or W/C > Bed: Min A (steadying Pt. > 75%)  FIM - Locomotion: Wheelchair Distance: 15 Locomotion: Wheelchair: 1: Total Assistance/staff pushes wheelchair (Pt<25%) FIM - Locomotion: Ambulation Ambulation/Gait Assistance: 1: +2 Total assist Locomotion: Ambulation: 0: Activity did not occur  Comprehension Comprehension Mode: Auditory Comprehension: 3-Understands basic 50 - 74% of the time/requires cueing 25 - 50%  of the time  Expression Expression Mode: Verbal Expression: 1-Expresses basis less than 25% of the time/requires cueing greater than 75% of the time.  Social Interaction Social Interaction: 1-Interacts appropriately less than 25% of the time. May be withdrawn or combative.  Problem Solving Problem Solving: 1-Solves basic less than 25% of the time - needs direction nearly all the time or does not effectively solve problems and may need a restraint for safety  Memory Memory: 1-Recognizes or recalls less than 25% of the time/requires cueing greater than 75% of the time Medical Problem List and Plan:  1. subarachnoid hemorrhage with small infarct left internal capsule: Status post IVC placement 08/18/2011 with coiling of aneurysm 08/19/2011 and VP shunt placement 09/02/2011  2. DVT Prophylaxis/Anticoagulation: SCDs. Monitor for any signs of DVT  3. Pain Management: Tylenol for now. Monitor  with increased mobility. Right knee and right ankle pain we'll check x-rays likely degenerative changes, no recent trauma  4. Neuropsych: This patient is not capable of making decisions on his/her own behalf.  -affect very flat. Trial ritalin initiated and no bp elevation thus far. Think this is more neuro than behavioral at this point  -mood assessment.    -rx hypernatremia. Sodium only slightly increased.  -recheck urine and head CT today 5. Dysphagia. Dysphagia 1 nectar thick liquids. Follow per speech therapy. Monitor for any signs of aspiration  6. Seizure prophylaxis. Keppra 500 mg twice a day  7. Hypertension. Hydralazine 20 mg 3 times a day, labetalol 200 mg 3 times a day, name of top 60 mg every 4 hours. Good control at present. 8. Hypernatremia. Followup labs reveal increased Na+. Initiate 1/2NS  9. Hyperglycemia. Latest blood sugars 175-82-115 No reports of diabetes mellitus. Patient presently on Lantus insulin 10 units each bedtime. Check blood sugars a.c. and at bedtime. hgb A1c elevated slight 10. Right ankle pain: gout  - uric acid level elevated  -added colchicine which seems to have helped. 11.FEN: megace trial.  -increase ivf to 150cc hour/ add potassium  -recheck labs tomorrow  LOS (Days) 5 A FACE TO FACE EVALUATION WAS PERFORMED  [name] T 09/13/2011, 6:50 AM

## 2011-09-13 NOTE — Progress Notes (Signed)
Occupational Therapy Session Note  Patient Details  Name: SUMI LYE MRN: [id number] Date of Birth: January 27, 1950  Today's Date: 09/13/2011 Time:  - 830-945 ( )    Short Term Goals: Week 1:  OT Short Term Goal 1 (Week 1): Pt. will feed self with right hand for 75 % of meal OT Short Term Goal 2 (Week 1): Pt. will bath upper bodly with minimal assist OT Short Term Goal 3 (Week 1): Pt. will sit unsupported for 15 minutes during grooming task OT Short Term Goal 4 (Week 1): Pt. will dress LB with moderate ssist and moderate assist with balance for 1 minute.    Skilled Therapeutic Interventions/Progress Updates:    1:1 self care retraining at sink level due to continuous IV running due to low sodium. Pt with increased lethargy today requiring max to total tactile, visual and verbal cues in the context of the environment initially, but as session went on pt demonstrated increased alertness. Session focused on sustained attention, following simple one step directions, sequencing and task organization, functional communication (made no verbalizations in session -other than moaning), sit to stands , standing balance with UE support. When standing at sink, without UE support pt with posterior LOB; required min to mod A to maintain balance for pulling up pants. Engaged in starting breakfast with max cuing to swallow and clear pocketing.  Therapy Documentation Precautions:  Precautions Precautions: Fall Precaution Comments:  (foley catheter, rectal tube removed) Restrictions Weight Bearing Restrictions: No Pain:  no c/o pain in session  See FIM for current functional status  Therapy/Group: Individual Therapy  Roney Mans [facility] 09/13/2011, 9:27 AM

## 2011-09-13 NOTE — Progress Notes (Signed)
Physical Therapy Note  Patient Details  Name: [name] MRN: [id number] Date of Birth: 11/14/1950 Today's Date: 09/13/2011  Time 1: 1000-1030 30 minutes  1:1 No c/o pain.  Gait training with RW 2 x 8' with mod A, occasional assist to advance R LE when fatigued, assist for RW control, cuing for sequencing, attention to task, safety.  SPT training with RW with gesturing and verbal cues for hand placement and mod A for turning safely with RW to sit.  Choosing task pt asked to choose between 2-3 options, correct 100% of time first 3 trials, then inattention and required total A to participate.  Pt with improved physical abilities, limited by cognitive deficits.   Time 2:  1400-1445 45 minutes  1:1 No c/o pain.  Gait training 2 x 20' with mod A, 1 episode of knees buckling when fatigued requiring total A to correct.  Attention, initiation task with ball bounce/toss, with pt able to initiate responses <25% of the time with min cuing, able to initiate > 50% with max cues.  Pt able to maintain dynamic sitting balance with supervision for ball toss and catch activity.  Car transfer to simulate SUV with mod A for SPT, assist to lift R LE into car.  Pt with improved sequencing with functional car transfer.    [name] 09/13/2011, 10:29 AM

## 2011-09-13 NOTE — Progress Notes (Signed)
CRITICAL VALUE ALERT  Critical value received:  Sodium level 161  Date of notification:  09/13/11  Time of notification:  0539  Critical value read back:yes  Nurse who received alert:  Mick Sell, RN  MD notified (1st page):  Deatra Ina, PA  Time of first page:  [phone]  MD notified (2nd page):  Time of second page:  Responding MD: Deatra Ina, PA   Time MD responded:  [phone]

## 2011-09-13 NOTE — Discharge Summary (Signed)
Physician Discharge Summary  Patient ID: [name] MRN: [id number] DOB/AGE: 63-May-1952 63 y.o.  LATE ENTRY 09/13/11 for 09/08/11  Admit date: 09/08/2011 Discharge date: 09/08/2011  Admission Diagnoses: SAH with Hydrocephalus, Anterior Communicating Artery Aneurysm  Discharge Diagnoses: SAH with Hydrocephalus, ACA aneurysm; s/p Ventriculostomy, ACA coiling, and ventriculoperitonel shunt placement  Principal Problem:  *SAH (subarachnoid hemorrhage)   Discharged Condition: fair  Hospital Course: [name] is a 61 year old female transferred from [facility] with subarachnoid hemorrhage with hydrocephalus. She had reportedly experienced severe headaches 08-16-11 followed by loss of consciousness. Supportive care had been initiated. She was transferred for intervention when her status began to improve. ACA aneurysm identified, with SAH & hydrocephalus. IVC placed by Dr. Venetia Maxon on 08/18/11. ACAA coiled by Dr. Corliss Skains on 08/19/11. Stable in ICU throughout. IVC inadvertently pulled during CT scan on 08/28/11. She remained stable, though by 09/01/11 CT demonstrated enlarging ventricles requiring ventriculoperitoneal shunt. VP shunt was placed by Dr. Venetia Maxon & Dr. Abbey Chatters on 09/02/11. She progressed slowly thereafter, transferring to [facility] from [facility]. Episodic lethargy/sleep/alertness thought to be r/t sleep patterns in years past. Transfer to CIR for intense rehabilitation.  Consults: CCM, General Surgery  Significant Diagnostic Studies: radiology: CT scan:   Treatments: surgery: IVC 08/18/11, ACAA coiling 08/19/11, VP Shunt 09/02/11  Discharge Exam: 09/08/11 Awakens to touch, albeit briefly. Opens eyes on command after awakened and moves extremities. Quickly fall back to sleep. PEARL. Incisions without erythema, swelling, or drainage. Belly soft, without evidence of discomfort with palpation. Foley & Flexiseal patent.    Due to varied levels of responsiveness/cooperation/activity over the last  few days paired with improved head CT s/p shunt placement makes the possibility of sleep cycle changes seems valid. Hopeful of increased interaction and participation with CIR assessment today.    Disposition: 62-Rehab Facility  Discharge Orders    Future Appointments: Provider: Department: Dept Phone: Center:   09/14/2011 8:15 AM Melonie Florida, OT [facility] [phone] None   09/14/2011 9:30 AM Virl Cagey, PT [facility] [phone] None   09/14/2011 11:00 AM Huston Foley, CCC-SLP [facility] [phone] None   09/14/2011 1:45 PM Melonie Florida, OT [facility] [phone] None     Medication List  As of 09/13/2011  2:58 PM   ASK your doctor about these medications         cloNIDine 0.1 MG tablet   Commonly known as: CATAPRES   Take 0.1 mg by mouth 2 (two) times daily.      hydrochlorothiazide 25 MG tablet   Commonly known as: HYDRODIURIL   Take 1 tablet (25 mg total) by mouth daily.      ibuprofen 800 MG tablet   Commonly known as: ADVIL,MOTRIN   Take 800 mg by mouth every 8 (eight) hours as needed. Pain      morphine 20 MG/ML concentrated solution   Commonly known as: ROXANOL   Take 0.5-1 mLs (10-20 mg total) by mouth every 2 (two) hours as needed for pain.             Signed: Georgiann Cocker 09/13/2011, 2:58 PMLATE ENTRY 09/13/11 for 09/08/11

## 2011-09-13 NOTE — Patient Care Conference (Signed)
Inpatient RehabilitationTeam Conference Note Date: 09/13/2011   Time: 3:25 PM    Patient Name: [name]      Medical Record Number: [id number]  Date of Birth: December 27, 1950 Sex: Female         Room/Bed: 4030/4030-01 Payor Info: Payor: MEDICAID PENDING  Plan: MEDICAID PENDING  Product Type: *No Product type*     Admitting Diagnosis: SAH   Admit Date/Time:  09/08/2011  4:30 PM Admission Comments: No comment available   Primary Diagnosis:  SAH (subarachnoid hemorrhage) Principal Problem: SAH (subarachnoid hemorrhage)  Patient Active Problem List   Diagnosis Date Noted  . SAH (subarachnoid hemorrhage) 09/09/2011  . Vasospasm 08/24/2011  . Subarachnoid hemorrhage 08/16/2011  . Encephalopathy 08/16/2011  . Aspiration pneumonia 08/16/2011  . Acute respiratory failure 08/16/2011  . HTN (hypertension) 08/16/2011    Expected Discharge Date: Expected Discharge Date: 09/29/11  Team Members Present: Physician: Dr. Faith Rogue Social Worker Present: Amada Jupiter, LCSW Nurse Present: Carlean Purl, RN PT Present: Reggy Eye, PT OT Present: Roney Mans, OT SLP Present: Feliberto Gottron, SLP Other (Discipline and Name): Tora Duck, PPS Coordinator     Current Status/Progress Goal Weekly Team Focus  Medical   slow initiation after [facility] with hydrocephalus. hypenatremia  rx above. stimulant trial  see chart   Bowel/Bladder   foley incontninent of bowel lbm 9-8 loose  due to sorbitol and senna    mod assist   up to bedside commode   Swallow/Nutrition/ Hydration   Dys. 1 textures and nectar thick liquids, Mod A  Min A with least restrictive diet  trials of upgraded textures, increased initaition and attention to meal    ADL's   min A for UB, incontient, mod to max with LB, mod A transfers  overall min A  attention, initiation, basic mobility, functional communication   Mobility   total A  min A  attention, transfers, mobility   Communication   Max-total A  Min A   initiaiton of verbal expression   Safety/Cognition/ Behavioral Observations  Max A  Min A  attention, initiaiton, problem solving    Pain   denies         Skin   dry skin eucerin cream applied right scalp and left abd incision staples intact   mod assist   monitor incsions      *See Interdisciplinary Assessment and Plan and progress notes for long and short-term goals  Barriers to Discharge: cognitive defiicts    Possible Resolutions to Barriers:  family ed, supervision, adaptive equipment    Discharge Planning/Teaching Needs:  Home with husband who can provide minimal assistance - pt's sister also to assist intermittently      Team Discussion:  Very impaired but starting to show some "personailty".  To recheck urine.  Do not feel her issues are language but, instead, cognition.  Revisions to Treatment Plan:  None   Continued Need for Acute Rehabilitation Level of Care: The patient requires daily medical management by a physician with specialized training in physical medicine and rehabilitation for the following conditions: Daily direction of a multidisciplinary physical rehabilitation program to ensure safe treatment while eliciting the highest outcome that is of practical value to the patient.: Yes Daily medical management of patient stability for increased activity during participation in an intensive rehabilitation regime.: Yes Daily analysis of laboratory values and/or radiology reports with any subsequent need for medication adjustment of medical intervention for : Post surgical problems;Neurological problems;Other  Latoyia Tecson 09/13/2011, 6:06 PM

## 2011-09-14 ENCOUNTER — Inpatient Hospital Stay (HOSPITAL_COMMUNITY): Payer: Medicaid Other | Admitting: Occupational Therapy

## 2011-09-14 ENCOUNTER — Inpatient Hospital Stay (HOSPITAL_COMMUNITY): Payer: Medicaid Other | Admitting: Speech Pathology

## 2011-09-14 ENCOUNTER — Inpatient Hospital Stay (HOSPITAL_COMMUNITY): Payer: Medicaid Other | Admitting: *Deleted

## 2011-09-14 DIAGNOSIS — I609 Nontraumatic subarachnoid hemorrhage, unspecified: Secondary | ICD-10-CM

## 2011-09-14 DIAGNOSIS — Z5189 Encounter for other specified aftercare: Secondary | ICD-10-CM

## 2011-09-14 LAB — BASIC METABOLIC PANEL
BUN: 23 mg/dL (ref 6–23)
Chloride: 123 mEq/L — ABNORMAL HIGH (ref 96–112)
GFR calc Af Amer: 85 mL/min — ABNORMAL LOW (ref >90)
Glucose, Bld: 111 mg/dL — ABNORMAL HIGH (ref 70–99)
Potassium: 3.5 mEq/L (ref 3.5–5.1)

## 2011-09-14 LAB — CBC
HCT: 30.8 % — ABNORMAL LOW (ref 36.0–46.0)
Hemoglobin: 9.3 g/dL — ABNORMAL LOW (ref 12.0–15.0)
RBC: 3.55 MIL/uL — ABNORMAL LOW (ref 3.87–5.11)
WBC: 6.6 10*3/uL (ref 4.0–10.5)

## 2011-09-14 LAB — GLUCOSE, CAPILLARY
Glucose-Capillary: 105 mg/dL — ABNORMAL HIGH (ref 70–99)
Glucose-Capillary: 173 mg/dL — ABNORMAL HIGH (ref 70–99)
Glucose-Capillary: 118 mg/dL — ABNORMAL HIGH (ref 70–99)
Glucose-Capillary: 138 mg/dL — ABNORMAL HIGH (ref 70–99)

## 2011-09-14 LAB — URINE CULTURE

## 2011-09-14 LAB — CORTISOL-AM, BLOOD: Cortisol - AM: 10.1 ug/dL (ref 4.3–22.4)

## 2011-09-14 MED ORDER — NIMODIPINE 30 MG PO CAPS
60.0000 mg | ORAL_CAPSULE | ORAL | Status: DC
  Administered 2011-09-14 – 2011-09-18 (×22): 60 mg via ORAL
  Filled 2011-09-14 (×30): qty 2

## 2011-09-14 MED ORDER — METHYLPHENIDATE HCL 5 MG PO TABS
10.0000 mg | ORAL_TABLET | Freq: Two times a day (BID) | ORAL | Status: DC
  Administered 2011-09-14 – 2011-09-20 (×13): 10 mg via ORAL
  Filled 2011-09-14 (×13): qty 2

## 2011-09-14 NOTE — Progress Notes (Signed)
Speech Language Pathology Daily Session Note  Patient Details  Name: [name] MRN: [id number] Date of Birth: January 13, 1950  Today's Date: 09/14/2011 Time: 4098-1191 Time Calculation (min): 45 min  Short Term Goals: Week 1: SLP Short Term Goal 1 (Week 1): Pt will initiate functional tasks with Mod A verbal and tactile cues.  SLP Short Term Goal 2 (Week 1): Pt will demonstrate sustained attention to a functional task for ~ 5 minutes with Mod A verbal and visual cues for redirection  SLP Short Term Goal 3 (Week 1): Pt will follow 2 step commands with Max A verbal and question cues.  SLP Short Term Goal 4 (Week 1): Pt will express wants/needs at the word level with Max A verbal and question cues.  SLP Short Term Goal 5 (Week 1): Pt will orient to place, time and situation with Max A contextual, enviornmental and semantic cues.  SLP Short Term Goal 6 (Week 1): Pt will consume Dys. 1 textures and nectar-thick liquids without overt s/s of aspiraiton with Mod A verbal cues for utilization of swallowing compensatory strategies.   Skilled Therapeutic Interventions: Treatment focus on cognitive and swallowing goals. Pt required total A to initiate grooming task and sustained attention to task for ~2 minutes with supervision verbal cues. Pt consumed trials of ice chips without overt s/s of aspiration but required Mod verbal cues to attend to bolus. Pt also consumed trials of thin liquids via spoon and cup with cough X 1 of 6 trials. Unable to determine vocal quality due to total A for initiation of verbal expression. Pt required Max A verbal and visual cues to initiate matching task and to sustain attention to task and Mod A for functional problem solving of task.    FIM:  Comprehension Comprehension Mode: Auditory Comprehension: 3-Understands basic 50 - 74% of the time/requires cueing 25 - 50%  of the time Expression Expression Mode: Verbal Expression: 1-Expresses basis less than 25% of the  time/requires cueing greater than 75% of the time. Social Interaction Social Interaction: 1-Interacts appropriately less than 25% of the time. May be withdrawn or combative. Problem Solving Problem Solving: 1-Solves basic less than 25% of the time - needs direction nearly all the time or does not effectively solve problems and may need a restraint for safety Memory Memory: 1-Recognizes or recalls less than 25% of the time/requires cueing greater than 75% of the time  Pain Pain Assessment Pain Assessment: No/denies pain  Therapy/Group: Individual Therapy  Chala Gul 09/14/2011, 4:44 PM

## 2011-09-14 NOTE — Progress Notes (Signed)
Social Work Patient ID: [name], female   DOB: 1950-10-09, 61 y.o.   MRN: [id number]  Met yesterday afternoon with pt, husband and sister to review team conference.  Pt sleeping throughout discussion and did not arouse.  Husband and sister aware and agreeable with targeted d/c date of 9/26 at minimal assistance goals overall.  Both pleased with gains she has made to date and optimistic about longer term recovery.  Continue to plan for pt to d/c home with her husband as primary support.    Faren Florence

## 2011-09-14 NOTE — Progress Notes (Signed)
Subjective/Complaints: Slept well. A little brighter today A 12 point review of systems has been performed and if not noted above is otherwise negative.  Objective: Vital Signs: Blood pressure 143/73, pulse 65, temperature 98.1 F (36.7 C), temperature source Oral, resp. rate 18, height 5\' 6"  (1.676 m), weight 92.08 kg (203 lb), SpO2 97.00%. Ct Head Wo Contrast  09/12/2011  *RADIOLOGY REPORT*  Clinical Data: Increasing lethargy.  Previous subarachnoid hemorrhage with hydrocephalus and vasospasm.  CT HEAD WITHOUT CONTRAST  Technique:  Contiguous axial images were obtained from the base of the skull through the vertex without contrast.  Comparison: Multiple priors, most recent 09/07/2011  Findings: Subacute areas of ischemia/infarction affect both frontal lobes, left corpus callosum, and left basal ganglia.  No new areas of infarction are observed.  No new hemorrhage.  Ventricular catheter position is unchanged, from right frontal approach with tip in the third ventricle slightly crossing the midline to the left.  No evidence for hydrocephalus.  No significant extra-axial fluid collection.  Calvarium intact other than the right frontal burr hole.  Aneurysm coil mass unchanged.  No acute sinus or mastoid disease.  IMPRESSION: Stable exam.  No evidence for worsening infarction, new hemorrhage, or hydrocephalus.   Original Report Authenticated By: Elsie Stain, M.D.     Basename 09/14/11 0628  WBC 6.6  HGB 9.3*  HCT 30.8*  PLT PENDING    Basename 09/14/11 0628 09/13/11 0451  NA 154* 161*  K 3.5 3.2*  CL 123* 127*  CO2 22 24  GLUCOSE 111* 160*  BUN 23 34*  CREATININE 0.83 0.92  CALCIUM 9.3 9.6   CBG (last 3)   Basename 09/14/11 0737 09/13/11 2114 09/13/11 1647  GLUCAP 105* 101* 102*    Wt Readings from Last 3 Encounters:  09/14/11 92.08 kg (203 lb)  09/07/11 98.7 kg (217 lb 9.5 oz)  09/07/11 98.7 kg (217 lb 9.5 oz)    Physical Exam:  HENT: Eyes:  Pupils are reactive to light  and  Neck: No thyromegaly present.  Cardiovascular: Normal rate and regular rhythm.  Pulmonary/Chest: Breath sounds normal. No respiratory distress. She has no wheezes.  Abdominal: Bowel sounds are normal. She exhibits no distension. There is no tenderness.  Neurological:  Patient with flat affect. More alert today. A little emotion was shown today. Still Very slow to process. . She does withdrawal to deep stimulihowever sensory testing limited by her mental status  Upper extremities have 4 minus grip arm flexion extension.  Right lower extremitytrace active movement although once again not clear whether or not she is fully aware of commands she does have right knee pain with range of motion  Left lower extremity has 2 minus hip knee extensor synergy  Right ankle less tender and warm. Right knee improved Skin intact   Assessment/Plan: 1. Functional deficits secondary to SAH/ACA aneurysm with hydrocephalus and VPS which require 3+ hours per day of interdisciplinary therapy in a comprehensive inpatient rehab setting. Physiatrist is providing close team supervision and 24 hour management of active medical problems listed below. Physiatrist and rehab team continue to assess barriers to discharge/monitor patient progress toward functional and medical goals. FIM: FIM - Bathing Bathing Steps Patient Completed: Chest;Abdomen;Right Arm;Left Arm;Right upper leg;Left upper leg (performed portions of bath) Bathing: 3: Mod-Patient completes 5-7 63f 10 parts or 50-74%  FIM - Upper Body Dressing/Undressing Upper body dressing/undressing steps patient completed: Thread/unthread right sleeve of pullover shirt/dresss;Thread/unthread left sleeve of pullover shirt/dress;Put head through opening of pull over shirt/dress;Pull  shirt over trunk;Thread/unthread right bra strap;Thread/unthread left bra strap Upper body dressing/undressing: 4: Min-Patient completed 75 plus % of tasks FIM - Lower Body  Dressing/Undressing Lower body dressing/undressing steps patient completed: Thread/unthread left pants leg;Don/Doff left sock;Don/Doff right sock;Don/Doff left shoe;Fasten/unfasten left shoe Lower body dressing/undressing: 3: Mod-Patient completed 50-74% of tasks  FIM - Toileting Toileting: 0: No continent bowel/bladder events this shift  FIM - Archivist Transfers: 0-Activity did not occur;4-To toilet/BSC: Min A (steadying Pt. > 75%);4-From toilet/BSC: Min A (steadying Pt. > 75%)  FIM - Bed/Chair Transfer Bed/Chair Transfer: 3: Bed > Chair or W/C: Mod A (lift or lower assist);3: Chair or W/C > Bed: Mod A (lift or lower assist)  FIM - Locomotion: Wheelchair Distance: 15 Locomotion: Wheelchair: 1: Total Assistance/staff pushes wheelchair (Pt<25%) FIM - Locomotion: Ambulation Ambulation/Gait Assistance: 1: +2 Total assist Locomotion: Ambulation: 1: Travels less than 50 ft with moderate assistance (Pt: 50 - 74%)  Comprehension Comprehension Mode: Auditory Comprehension: 3-Understands basic 50 - 74% of the time/requires cueing 25 - 50%  of the time  Expression Expression Mode: Verbal Expression: 1-Expresses basis less than 25% of the time/requires cueing greater than 75% of the time.  Social Interaction Social Interaction: 1-Interacts appropriately less than 25% of the time. May be withdrawn or combative.  Problem Solving Problem Solving: 1-Solves basic less than 25% of the time - needs direction nearly all the time or does not effectively solve problems and may need a restraint for safety  Memory Memory: 1-Recognizes or recalls less than 25% of the time/requires cueing greater than 75% of the time Medical Problem List and Plan:  1. subarachnoid hemorrhage with small infarct left internal capsule: Status post IVC placement 08/18/2011 with coiling of aneurysm 08/19/2011 and VP shunt placement 09/02/2011  2. DVT Prophylaxis/Anticoagulation: SCDs. Monitor for any signs of  DVT  3. Pain Management: Tylenol for now. Monitor with increased mobility. Right knee and right ankle pain we'll check x-rays likely degenerative changes, no recent trauma  4. Neuropsych: This patient is not capable of making decisions on his/her own behalf.  -affect very flat. Trial ritalin initiated and no bp elevation thus far. Think this is more neuro than behavioral at this point  -mood assessment.    -rx hypernatremia. Improved today  -change ivf to qhs only  -ucx with multispecies 5. Dysphagia. Dysphagia 1 nectar thick liquids. Follow per speech therapy. Monitor for any signs of aspiration  6. Seizure prophylaxis. Keppra 500 mg twice a day  7. Hypertension. Hydralazine 20 mg 3 times a day, labetalol 200 mg 3 times a day, name of top 60 mg every 4 hours. Good control at present. 8. Hypernatremia. ivf as above 9. Hyperglycemia.  Check blood sugars a.c. and at bedtime. hgb A1c elevated slight 10. Right ankle pain: gout  - uric acid level elevated  -added colchicine which seems to have helped. 11.FEN: megace trial.   -recheck labs daily  LOS (Days) 6 A FACE TO FACE EVALUATION WAS PERFORMED  SWARTZ,ZACHARY T 09/14/2011, 7:52 AM

## 2011-09-14 NOTE — Progress Notes (Signed)
Physical Therapy Session Note  Patient Details  Name: [name] MRN: [id number] Date of Birth: Nov 28, 1950  Today's Date: 09/14/2011 Time: 4098-1191 Time Calculation (min): 59 min  Short Term Goals: Week 1:  PT Short Term Goal 1 (Week 1): Pt will perform bed <> chair transfers with max A PT Short Term Goal 2 (Week 1): Pt will gait in controlled environment with +2 assist pt >50% x 10' in controlled environment PT Short Term Goal 3 (Week 1): Pt will propel w/c 50' in controlled environment with supervision  Skilled Therapeutic Interventions/Progress Updates:  Tx focused on WC mobility, transfer training, and gait training with RW.  Pt propelled WC x40' with Mod A for steering and constant cues for technique and attention to task. Pt unable to avoid obstacles/wall on R side.  Worked on reciprocal anterior/posterior scooting in WC  and lateral scooting on mat with cues for attention to task. Pt able to scoot L side > R.  Stand-pivot transfer with Mod A towards L WC>mat with cues for technique. Pt attempting to sit prior to fully in front of amt.  Sit<>stand x5, 3x60' with R/L weight-shifting and cognitive tasks.  Gait 1x8' and 1x30' with Max A and WC follow. Pt needing assist for L weight shift for R step, R knee stability, and R LE advancement, as well as RW management. First walk in main gym, second walk much better in quiet ortho gym with less distraction. Gait quality and R step degrade with fatigue.  Pt needing AAROM for 10 LAQ on R, but able to hold independently 5 sec holds R knee for strengthening.  Pt with no c/o pain      Therapy Documentation Precautions:  Precautions Precautions: Fall Precaution Comments:  (foley catheter, rectal tube removed) Restrictions Weight Bearing Restrictions: No Locomotion : Ambulation Ambulation/Gait Assistance: 2: Max Lawyer Distance: 40   See FIM for current functional status  Therapy/Group: Individual  Therapy  Virl Cagey, PT 09/14/2011, 10:38 AM

## 2011-09-14 NOTE — Progress Notes (Signed)
Nutrition Follow-up  Intervention:   1. Continue to encourage PO intake of meals as able 2. RD to continue to follow  Assessment:   Intake remains variable, consuming 0 - 75%. Noted pt on megace trial at this time. Discussed PO intake with family, they noted that pt continues to eat well when pt is encouraged at meal times, whether it be by nursing or family.  Diet Order:  Dysphagia 1 with Nectar Thickened Liquids  Meds: Scheduled Meds:   . antiseptic oral rinse  15 mL Mouth Rinse q12n4p  . chlorhexidine  15 mL Mouth Rinse BID  . colchicine  0.6 mg Oral BID  . hydrALAZINE  20 mg Oral Q8H  . hydrocerin   Topical BID  . labetalol  200 mg Oral TID  . levETIRAcetam  500 mg Oral BID  . megestrol  400 mg Oral BID  . methylphenidate  10 mg Oral BID WC  . niMODipine  60 mg Oral Q4H  . nystatin  5 mL Oral QID  . pantoprazole sodium  40 mg Oral Q1200  . DISCONTD: methylphenidate  5 mg Oral BID WC  . DISCONTD: NiMODipine  60 mg Oral Q4H   Continuous Infusions:   . 0.45 % NaCl with KCl 20 mEq / L 125 mL/hr at 09/14/11 0130   PRN Meds:.acetaminophen, albuterol, food thickener, ondansetron (ZOFRAN) IV, ondansetron, senna-docusate, sorbitol  Labs:  CMP     Component Value Date/Time   NA 154* 09/14/2011 0628   K 3.5 09/14/2011 0628   CL 123* 09/14/2011 0628   CO2 22 09/14/2011 0628   GLUCOSE 111* 09/14/2011 0628   BUN 23 09/14/2011 0628   CREATININE 0.83 09/14/2011 0628   CALCIUM 9.3 09/14/2011 0628   PROT 6.8 09/09/2011 0600   ALBUMIN 2.6* 09/09/2011 0600   AST 82* 09/09/2011 0600   ALT 78* 09/09/2011 0600   ALKPHOS 84 09/09/2011 0600   BILITOT 0.4 09/09/2011 0600   GFRNONAA 73* 09/14/2011 0628   GFRAA 85* 09/14/2011 0628   CBG (last 3)   Basename 09/14/11 1128 09/14/11 0737 09/13/11 2114  GLUCAP 173* 105* 101*      Intake/Output Summary (Last 24 hours) at 09/14/11 1505 Last data filed at 09/14/11 0800  Gross per 24 hour  Intake    120 ml  Output    650 ml  Net   -530 ml  BM  9/9  Weight Status:  92.1 kg - wt down 6 kg x 1 week  Estimated needs:  1600 - 1800 kcal, 115 - 130 grams protein  Nutrition Dx:  Inadequate oral intake r/t decreased appetite AEB variable meal completion.  Goal: Pt to meet >/= 90% of their estimated nutrition needs; improving  Monitor: weight trends, lab trends, I/O's, PO intake, supplement tolerance  Jarold Motto MS, RD, LDN Pager: [phone] After-hours pager: [phone]

## 2011-09-14 NOTE — Progress Notes (Signed)
Occupational Therapy Session Note  Patient Details  Name: [name] MRN: [id number] Date of Birth: 08-11-1950  Today's Date: 09/14/2011 Time: 0815-0900 Time Calculation (min): 45 min  Short Term Goals: Week 1:  OT Short Term Goal 1 (Week 1): Pt. will feed self with right hand for 75 % of meal OT Short Term Goal 2 (Week 1): Pt. will bath upper bodly with minimal assist OT Short Term Goal 3 (Week 1): Pt. will sit unsupported for 15 minutes during grooming task OT Short Term Goal 4 (Week 1): Pt. will dress LB with moderate ssist and moderate assist with balance for 1 minute.    Skilled Therapeutic Interventions/Progress Updates:    1:1 self care retraining at sink level- pt still with IV running continuous. Focus on functional communication, initially did well but then became non-verbal while performing functional tasks. Pt able to maintained sustained attention with max A. When pt stays active with mod to max A with functional tasks pt with less perseverative and increased attention to task for completion. Sit to stand with min A within context of situation (ie clothing management, bathing peri area). Pt required mod A to maintaining standing balance with out UE support, demonstrating posterior lean and right knee buckling.   2nd session 1345-1415  no c/o pain. Pt in bed without pants on when arrived. Focus on bed mobility to com to EOB with don pants and shoes. Mod A for sit to stand and stand pivot transfer to w/c on the pt's left. Pt with difficult stepping with left LE over to the w/c due to difficulty with controlled weight shifts. Engaged in self feeding in dayroom (a minimal distracting environment) for lunch. Pt needed max cuing to swallow and manage food in mouth before taking another bite. Sips of juice in between bites helped clear oral reside.  y Documentation Precautions:  Precautions Precautions: Fall Precaution Comments:  (foley catheter, rectal tube  removed) Restrictions Weight Bearing Restrictions: No Pain: Pain Assessment Pain Assessment: No/denies pain  See FIM for current functional status  Therapy/Group: Individual Therapy  Roney Mans [facility] 09/14/2011, 4:58 PM

## 2011-09-15 ENCOUNTER — Inpatient Hospital Stay (HOSPITAL_COMMUNITY): Payer: Medicaid Other | Admitting: Occupational Therapy

## 2011-09-15 ENCOUNTER — Inpatient Hospital Stay (HOSPITAL_COMMUNITY): Payer: Medicaid Other | Admitting: Speech Pathology

## 2011-09-15 ENCOUNTER — Inpatient Hospital Stay (HOSPITAL_COMMUNITY): Payer: Medicaid Other

## 2011-09-15 ENCOUNTER — Inpatient Hospital Stay (HOSPITAL_COMMUNITY): Payer: Medicaid Other | Admitting: Physical Therapy

## 2011-09-15 LAB — BASIC METABOLIC PANEL
BUN: 15 mg/dL (ref 6–23)
CO2: 21 mEq/L (ref 19–32)
Calcium: 8.9 mg/dL (ref 8.4–10.5)
Creatinine, Ser: 0.73 mg/dL (ref 0.50–1.10)

## 2011-09-15 LAB — GLUCOSE, CAPILLARY

## 2011-09-15 NOTE — Progress Notes (Signed)
Physical Therapy Note  Patient Details  Name: [name] MRN: [id number] Date of Birth: 03-07-1950 Today's Date: 09/15/2011  Time: 1300-1345 45 minutes  No c/o pain.  Gait training with mod-max A 2 x 25' with pt requiring increased assistance to advance R LE today.  Pt requires assist for wt shifting, turning, foot placement, RW safety, max A to step backwards.  Standing attention, sequencing, problem solving activity with washing dishes.  Pt required questioning cues for sequencing, assist for sorting correctly.  Pt able to stand 3 x 5 mins with B UE activity, required cuing for sustained attention > 1 minute.   Individual therapy   DONAWERTH,KAREN 09/15/2011, 4:11 PM

## 2011-09-15 NOTE — Procedures (Signed)
Objective Swallowing Evaluation: Modified Barium Swallowing Study  Patient Details  Name: [name] MRN: [id number] Date of Birth: [birth year]  Today's Date: 09/15/2011 Time: 4696-2952 Time Calculation (min): 43 min  Past Medical History:  Past Medical History  Diagnosis Date  . Hypertension    Past Surgical History:  Past Surgical History  Procedure Date  . Abdominal hysterectomy   . Ventriculoperitoneal shunt 09/02/2011    Procedure: SHUNT INSERTION VENTRICULAR-PERITONEAL;  Surgeon: Maeola Harman, MD;  Location: [facility];  Service: Neurosurgery;  Laterality: Right;   HPI:  61 y.o. female admitted with SAH,   s/p VP shunt and aspiration pna. Complex medical course, now on CIR. Referred for MBSS to assess for diet upgrade.      Recommendation/Prognosis   Dysphagia Diagnosis: Mild oral phase dysphagia;Mild pharyngeal phase dysphagia   Pt presents with mild oral and pharyngeal dysphagia. Primarily with slow initiation of mastication, bolus initiation and swallow. All boluses spilled to valleuclae prior to  initiation of the swallow. Pt did not transfer pill to the back of her mouth with cup sips of thin liquid or when presnted with puree.  Barium pill was expactorated after several attempts to swallow it. Delayed swallow was most prominent with solids. No pharyngeal residue occurred during this study. Soft solids passes UES without difficulty. Esophagea sweep after soft solid revealed no residue. Pt tolerated successive straw sips of thin liquids without difficulty. Pt will require supervision, extra time to complete meal, and meds crushed in puree. Recommend Dys. 2 diet with thin liquids, full upright with verbal cues to swallow. Please make sure pt's mouth is clear before taking the next bite.   SLP to advance solids with clinical gains.  Swallow Evaluation Recommendations Diet Recommendations: Dysphagia 2 (Fine chop);Thin liquid Liquid Administration via: Cup;Straw Medication  Administration: Crushed with puree Supervision: Full supervision/cueing for compensatory strategies Compensations: Check for pocketing Postural Changes and/or Swallow Maneuvers: Seated upright 90 degrees Oral Care Recommendations: Oral care BID Follow up Recommendations: Inpatient Rehab Prognosis Prognosis for Safe Diet Advancement: Good Barriers to Reach Goals: Cognitive deficits Individuals Consulted Consulted and Agree with Results and Recommendations: Patient;RN   Short Term Goals: Week 1: SLP Short Term Goal 1 (Week 1): Pt will initiate functional tasks with Mod A verbal and tactile cues.  SLP Short Term Goal 2 (Week 1): Pt will demonstrate sustained attention to a functional task for ~ 5 minutes with Mod A verbal and visual cues for redirection  SLP Short Term Goal 3 (Week 1): Pt will follow 2 step commands with Max A verbal and question cues.  SLP Short Term Goal 4 (Week 1): Pt will express wants/needs at the word level with Max A verbal and question cues.  SLP Short Term Goal 5 (Week 1): Pt will orient to place, time and situation with Max A contextual, enviornmental and semantic cues.  SLP Short Term Goal 6 (Week 1): Pt will consume Dys 2 diet with thin liquids and no s/s of aspiration with Mod A for swallowing strategies SLP Short Term Goal 6 - Progress (Week 1): Updated due to goal met      Lovvorn, Radene Journey 09/15/2011, 10:15 AM

## 2011-09-15 NOTE — Progress Notes (Signed)
Subjective/Complaints: Slept well. Husband at bedside A 12 point review of systems has been performed and if not noted above is otherwise negative.  Objective: Vital Signs: Blood pressure 128/66, pulse 93, temperature 98.7 F (37.1 C), temperature source Oral, resp. rate 18, height 5\' 6"  (1.676 m), weight 93.3 kg (205 lb 11 oz), SpO2 100.00%. No results found.  Basename 09/14/11 0628  WBC 6.6  HGB 9.3*  HCT 30.8*  PLT 116*    Basename 09/15/11 0600 09/14/11 0628  NA 149* 154*  K 3.6 3.5  CL 118* 123*  CO2 21 22  GLUCOSE 125* 111*  BUN 15 23  CREATININE 0.73 0.83  CALCIUM 8.9 9.3   CBG (last 3)   Basename 09/15/11 0743 09/14/11 2138 09/14/11 1701  GLUCAP 114* 138* 118*    Wt Readings from Last 3 Encounters:  09/15/11 93.3 kg (205 lb 11 oz)  09/07/11 98.7 kg (217 lb 9.5 oz)  09/07/11 98.7 kg (217 lb 9.5 oz)    Physical Exam:  HENT: Eyes:  Pupils are reactive to light and  Neck: No thyromegaly present.  Cardiovascular: Normal rate and regular rhythm.  Pulmonary/Chest: Breath sounds normal. No respiratory distress. She has no wheezes.  Abdominal: Bowel sounds are normal. She exhibits no distension. There is no tenderness.  Neurological:  Patient with flat affect. More alert today again. A little emotion was shown today. Still Very slow to process. . She does withdrawal to deep stimulihowever sensory testing limited by her mental status  Upper extremities have 4 minus grip arm flexion extension.  Right lower extremitytrace active movement although once again not clear whether or not she is fully aware of commands she does have right knee pain with range of motion  Left lower extremity has 2 minus hip knee extensor synergy  Right ankle less tender and warm. Right knee improved Skin intact   Assessment/Plan: 1. Functional deficits secondary to SAH/ACA aneurysm with hydrocephalus and VPS which require 3+ hours per day of interdisciplinary therapy in a comprehensive  inpatient rehab setting. Physiatrist is providing close team supervision and 24 hour management of active medical problems listed below. Physiatrist and rehab team continue to assess barriers to discharge/monitor patient progress toward functional and medical goals. FIM: FIM - Bathing Bathing Steps Patient Completed: Chest;Right Arm;Left Arm;Abdomen;Front perineal area;Right upper leg;Left upper leg;Left lower leg (including foot) Bathing: 4: Min-Patient completes 8-9 67f 10 parts or 75+ percent  FIM - Upper Body Dressing/Undressing Upper body dressing/undressing steps patient completed: Thread/unthread right sleeve of pullover shirt/dresss;Thread/unthread left sleeve of pullover shirt/dress;Put head through opening of pull over shirt/dress;Pull shirt over trunk;Thread/unthread right bra strap;Thread/unthread left bra strap Upper body dressing/undressing: 4: Min-Patient completed 75 plus % of tasks FIM - Lower Body Dressing/Undressing Lower body dressing/undressing steps patient completed: Thread/unthread left underwear leg;Pull underwear up/down;Thread/unthread left pants leg;Pull pants up/down;Don/Doff right sock;Don/Doff left sock;Don/Doff left shoe;Fasten/unfasten right shoe;Fasten/unfasten left shoe Lower body dressing/undressing: 3: Mod-Patient completed 50-74% of tasks  FIM - Toileting Toileting: 0: No continent bowel/bladder events this shift  FIM - Archivist Transfers: 0-Activity did not occur;4-To toilet/BSC: Min A (steadying Pt. > 75%);4-From toilet/BSC: Min A (steadying Pt. > 75%)  FIM - Banker Devices: Walker;Arm rests Bed/Chair Transfer: 4: Supine > Sit: Min A (steadying Pt. > 75%/lift 1 leg);3: Bed > Chair or W/C: Mod A (lift or lower assist)  FIM - Locomotion: Wheelchair Distance: 40 Locomotion: Wheelchair: 1: Travels less than 50 ft with moderate assistance (Pt: 50 -  74%) FIM - Locomotion: Ambulation Locomotion:  Ambulation Assistive Devices: Walker - Rolling Ambulation/Gait Assistance: 2: Max assist Locomotion: Ambulation: 1: Travels less than 50 ft with maximal assistance (Pt: 25 - 49%)  Comprehension Comprehension Mode: Auditory Comprehension: 3-Understands basic 50 - 74% of the time/requires cueing 25 - 50%  of the time  Expression Expression Mode: Verbal Expression: 1-Expresses basis less than 25% of the time/requires cueing greater than 75% of the time.  Social Interaction Social Interaction: 1-Interacts appropriately less than 25% of the time. May be withdrawn or combative.  Problem Solving Problem Solving: 1-Solves basic less than 25% of the time - needs direction nearly all the time or does not effectively solve problems and may need a restraint for safety  Memory Memory: 1-Recognizes or recalls less than 25% of the time/requires cueing greater than 75% of the time Medical Problem List and Plan:  1. subarachnoid hemorrhage with small infarct left internal capsule: Status post IVC placement 08/18/2011 with coiling of aneurysm 08/19/2011 and VP shunt placement 09/02/2011  2. DVT Prophylaxis/Anticoagulation: SCDs. Monitor for any signs of DVT  3. Pain Management: Tylenol for now. Monitor with increased mobility. Right knee and right ankle pain we'll check x-rays likely degenerative changes, no recent trauma  4. Neuropsych: This patient is not capable of making decisions on his/her own behalf.  -affect very flat. Trial ritalin initiated and no bp elevation thus far. Think this is more neuro than behavioral at this point  -mood assessment.    -rx hypernatremia. Improved further today  -change ivf to qhs only. May dc tomorrow if further improved  -ucx with multispecies 5. Dysphagia. Dysphagia 1 nectar thick liquids. Follow per speech therapy. Monitor for any signs of aspiration  6. Seizure prophylaxis. Keppra 500 mg twice a day  7. Hypertension. Hydralazine 20 mg 3 times a day, labetalol  200 mg 3 times a day, name of top 60 mg every 4 hours. Good control at present. 8. Hypernatremia/hypovolemic. ivf as above 9. Hyperglycemia.  Check blood sugars a.c. and at bedtime. hgb A1c elevated slight 10. Right ankle pain: gout  - uric acid level elevated  -added colchicine which seems to have helped. 11.FEN: megace trial.   -recheck labs again tomorrow  LOS (Days) 7 A FACE TO FACE EVALUATION WAS PERFORMED  Pam Vanalstine T 09/15/2011, 7:50 AM

## 2011-09-15 NOTE — Progress Notes (Signed)
Occupational Therapy Session Note  Patient Details  Name: [name] MRN: [id number] Date of Birth: [birth year]  Today's Date: 09/15/2011 Time: 815-900 Time Calculation (min): 45 min  Short Term Goals: Week 1:  OT Short Term Goal 1 (Week 1): Pt. will feed self with right hand for 75 % of meal OT Short Term Goal 2 (Week 1): Pt. will bath upper bodly with minimal assist OT Short Term Goal 3 (Week 1): Pt. will sit unsupported for 15 minutes during grooming task OT Short Term Goal 4 (Week 1): Pt. will dress LB with moderate ssist and moderate assist with balance for 1 minute.    Skilled Therapeutic Interventions/Progress Updates:    Self care retraining: Session focused on sit<>stands for clothing management from EOB using RW for unilateral UE support while pulling up pants with other UE,pt noted to have decreased standing balance when supported using R UE possibly due to weakness on that side. Pt leans backwards when UE unsupported during standing and has difficulty weight shifting forward and grading knee flexion/extension. Also focused on transfers in preparation for self care tasks, stand pivot to w/c with Min A and LOB to EOB due to LE weakness/fatigue. Pt performed feeding task with instructional cues for initiation taking bites and to take sips of drink after a few bites; pt noted to have improved automatic swallowing after bite.  Second Session: 1400-1430 30 min Session focused on cognitive and communication skills. Patient engaged in task requiring sustained and selective attention and working memory with Max verbal cues. Pt asked to identify grocery item on coupon with distracting background requiring therapist to point out word to identify the item; Pt required Max questioning cues for comprehension and association of items for categorization into "breakfast foods." Pt engaged in simple monetary calculation task, Min verbal cues to make 75 cents, Max verbal cues to count change. Pt  able to engage in tasks for brief periods before requiring Max verbal cues for sustaining attention to task and demonstrates poor working memory.   Therapy Documentation Precautions:  Precautions Precautions: Fall Precaution Comments:  (foley catheter, rectal tube removed) Restrictions Weight Bearing Restrictions: No  Pain:  no c/o pain this session See FIM for current functional status  Therapy/Group: Individual Therapy  Jackelyn Poling 09/15/2011, 3:54 PM

## 2011-09-15 NOTE — Progress Notes (Signed)
This note has been reviewed and this clinician agrees with information provided.  

## 2011-09-15 NOTE — Progress Notes (Signed)
Physical Therapy Weekly Progress Note  Patient Details  Name: [name] MRN: [id number] Date of Birth: Sep 09, 1950  Today's Date: 09/15/2011  Patient has met 2 of 3 short term goals.  Pt has improved mobility, gait and balance and is able to transfer with min-mod A and gait with max A with RW.  Pt limited by poor attention and awareness as well as decreased problem solving, sequencing and safety awareness.  Patient continues to demonstrate the following deficits: decreased strength, balance, mobility,gait and decreased attention and awareness and therefore will continue to benefit from skilled PT intervention to enhance overall performance with activity tolerance, balance, postural control, ability to compensate for deficits, attention, awareness and coordination.  Patient progressing toward long term goals..  Continue plan of care.  PT Short Term Goals Week 1:  PT Short Term Goal 1 (Week 1): Pt will perform bed <> chair transfers with max A PT Short Term Goal 1 - Progress (Week 1): Met PT Short Term Goal 2 (Week 1): Pt will gait in controlled environment with +2 assist pt >50% x 10' in controlled environment PT Short Term Goal 2 - Progress (Week 1): Met PT Short Term Goal 3 (Week 1): Pt will propel w/c 50' in controlled environment with supervision PT Short Term Goal 3 - Progress (Week 1): Progressing toward goal Week 2:  PT Short Term Goal 1 (Week 2): Pt will propel w/c 50' in controlled environment with supervision PT Short Term Goal 2 (Week 2): Pt will gait with min A 25' in controlled environment PT Short Term Goal 3 (Week 2): Pt will demo sustained attention x 2 minutes to functional task with min A  Skilled Therapeutic Interventions/Progress Updates:  Ambulation/gait training;Community reintegration;DME/adaptive equipment instruction;Neuromuscular re-education;Therapeutic Exercise;UE/LE Strength taining/ROM;Splinting/orthotics;Pain management;Discharge planning;Balance/vestibular  training;Cognitive remediation/compensation;Functional mobility training;Therapeutic Activities;UE/LE Coordination activities;Stair training;Patient/family education;Wheelchair propulsion/positioning    See FIM for current functional status   DONAWERTH,KAREN 09/15/2011, 4:32 PM

## 2011-09-15 NOTE — Progress Notes (Signed)
Speech Language Pathology Daily Session Note  Patient Details  Name: [name] MRN: [id number] Date of Birth: Jan 06, 1950  Today's Date: 09/15/2011 Time: 1100-1130 Time Calculation (min): 30 min  Short Term Goals: Week 1: SLP Short Term Goal 1 (Week 1): Pt will initiate functional tasks with Mod A verbal and tactile cues.  SLP Short Term Goal 2 (Week 1): Pt will demonstrate sustained attention to a functional task for ~ 5 minutes with Mod A verbal and visual cues for redirection  SLP Short Term Goal 3 (Week 1): Pt will follow 2 step commands with Max A verbal and question cues.  SLP Short Term Goal 4 (Week 1): Pt will express wants/needs at the word level with Max A verbal and question cues.  SLP Short Term Goal 5 (Week 1): Pt will orient to place, time and situation with Max A contextual, enviornmental and semantic cues.  SLP Short Term Goal 6 (Week 1): Pt will consume Dys 2 diet with thin liquids and no s/s of aspiration with Mod A for swallowing strategies SLP Short Term Goal 6 - Progress (Week 1): Updated due to goal met  Skilled Therapeutic Interventions:   Pt required max verbal cues and 2 episodes of tactile support at her elbow to initiate pouring sugar in her coffee and stirring her coffee with a straw. She picked up the straw to stir 1/3x with max verbal cues. [name] nodded to yes/no questions with repetition and extended time less than 50% of the time. She made a choice of 2, again with repetitions and extended time 1/4 trials. Initially, pt closing her eyes and attending to task for 10-15 seconds. Max verbal and minimal tactile cues to attend through out session, by end of session, pt was sustaining attention to her snack for 5-7 minutes and initiating self feeding of graham cracker with moderate verbal cues. Pt used grimace to indicate that she did not like the lemonade or coffee, despite max cues, modeling and questioning for her to verbally express her dislike.  Pt  consumed 1 package of graham crackers, 4 oz grape juice with straw and coffee sips with no s/s of aspiration. Mastication with cracker was improved since  MBSS this am. Expect SLP may be able to advance pt's solids over next week.  FIM:  Comprehension Comprehension: 3-Understands basic 50 - 74% of the time/requires cueing 25 - 50%  of the time Expression Expression: 1-Expresses basis less than 25% of the time/requires cueing greater than 75% of the time. Social Interaction Social Interaction: 2-Interacts appropriately 25 - 49% of time - Needs frequent redirection. Problem Solving Problem Solving: 1-Solves basic less than 25% of the time - needs direction nearly all the time or does not effectively solve problems and may need a restraint for safety Memory Memory: 2-Recognizes or recalls 25 - 49% of the time/requires cueing 51 - 75% of the time FIM - Eating Eating Activity: 5: Set-up assist for open containers  Pain  Denies pain  Therapy/Group: Individual Therapy  [name], [name] 09/15/2011, 11:51 AM

## 2011-09-16 ENCOUNTER — Inpatient Hospital Stay (HOSPITAL_COMMUNITY): Payer: Medicaid Other | Admitting: Physical Therapy

## 2011-09-16 ENCOUNTER — Encounter (HOSPITAL_COMMUNITY): Payer: Self-pay | Admitting: Occupational Therapy

## 2011-09-16 ENCOUNTER — Inpatient Hospital Stay (HOSPITAL_COMMUNITY): Payer: Medicaid Other | Admitting: Speech Pathology

## 2011-09-16 DIAGNOSIS — E870 Hyperosmolality and hypernatremia: Secondary | ICD-10-CM

## 2011-09-16 DIAGNOSIS — I609 Nontraumatic subarachnoid hemorrhage, unspecified: Secondary | ICD-10-CM

## 2011-09-16 DIAGNOSIS — Z5189 Encounter for other specified aftercare: Secondary | ICD-10-CM

## 2011-09-16 LAB — GLUCOSE, CAPILLARY
Glucose-Capillary: 153 mg/dL — ABNORMAL HIGH (ref 70–99)
Glucose-Capillary: 153 mg/dL — ABNORMAL HIGH (ref 70–99)
Glucose-Capillary: 187 mg/dL — ABNORMAL HIGH (ref 70–99)
Glucose-Capillary: 145 mg/dL — ABNORMAL HIGH (ref 70–99)

## 2011-09-16 LAB — BASIC METABOLIC PANEL
CO2: 22 mEq/L (ref 19–32)
Chloride: 116 mEq/L — ABNORMAL HIGH (ref 96–112)
Creatinine, Ser: 0.7 mg/dL (ref 0.50–1.10)
Potassium: 3.8 mEq/L (ref 3.5–5.1)

## 2011-09-16 NOTE — Progress Notes (Signed)
Physical Therapy Note  Patient Details  Name: ISMERAI BROCKSCHMIDT MRN: [id number] Date of Birth: 1950/05/03 Today's Date: 09/16/2011  Time: 800-900 60 minutes  No c/o pain.  Treatment session focused on transfers, standing tolerance and gait in home environment.  Pt gait around room and bathroom with mod A, assist to advance R LE today.  Pt limited by poor focused attention, requiring max cues to attend to task.  Bed <> w/c and w/c <> toilet transfers with mod A due to decreased motor planning and sequencing, pt demo'd decreased initiation today.  Pt with incontinent bowel episode, multiple sit to stands from toilet with min-mod A with grab bar, pt able to stand x 3-4 minutes for total A with hygiene.  W/c mobility with pt requiring mod A, max cuing to utilize R UE for steering.  Pt limited by focused attention only 5-10 seconds this morning.  Individual therapy   Asley Baskerville 09/16/2011, 8:56 AM

## 2011-09-16 NOTE — Progress Notes (Signed)
Speech Language Pathology Daily Session Note  Patient Details  Name: [name] MRN: [id number] Date of Birth: 08/24/1950  Today's Date: 09/16/2011 Time: 1200-1230 Time Calculation (min): 30 min  Short Term Goals: Week 1: SLP Short Term Goal 1 (Week 1): Pt will initiate functional tasks with Mod A verbal and tactile cues.  SLP Short Term Goal 2 (Week 1): Pt will demonstrate sustained attention to a functional task for ~ 5 minutes with Mod A verbal and visual cues for redirection  SLP Short Term Goal 3 (Week 1): Pt will follow 2 step commands with Max A verbal and question cues.  SLP Short Term Goal 4 (Week 1): Pt will express wants/needs at the word level with Max A verbal and question cues.  SLP Short Term Goal 5 (Week 1): Pt will orient to place, time and situation with Max A contextual, enviornmental and semantic cues.  SLP Short Term Goal 6 (Week 1): Pt will consume Dys 2 diet with thin liquids and no s/s of aspiration with Mod A for swallowing strategies SLP Short Term Goal 6 - Progress (Week 1): Updated due to goal met  Skilled Therapeutic Interventions: Co-treatment group session; SLP facilitated session with mod assist faded to min assist verbal and tactile cues to initiate and problem solve during self feeding task.  SLP facilitated session with max assist cues (choice of two, open ended questions, etc.) to encourage verbal expression; however, it was ineffective.  Patient demonstrated no overt s/s of aspiration during meal.     FIM:  Comprehension Comprehension: 4-Understands basic 75 - 89% of the time/requires cueing 10 - 24% of the time Expression Expression: 1-Expresses basis less than 25% of the time/requires cueing greater than 75% of the time. Social Interaction Social Interaction: 2-Interacts appropriately 25 - 49% of time - Needs frequent redirection. Problem Solving Problem Solving: 2-Solves basic 25 - 49% of the time - needs direction more than half the time to  initiate, plan or complete simple activities Memory Memory: 2-Recognizes or recalls 25 - 49% of the time/requires cueing 51 - 75% of the time FIM - Eating Eating Activity: 5: Supervision/cues;5: Set-up assist for open containers  Pain Pain Assessment Pain Assessment: No/denies pain  Therapy/Group: Group Therapy  [name]., CCC-SLP [phone]  [name] 09/16/2011, 1:17 PM

## 2011-09-16 NOTE — Progress Notes (Signed)
Occupational Therapy Session Note  Patient Details  Name: [name] MRN: [id number] Date of Birth: 1950/07/26  Today's Date: 09/16/2011 Time: [phone] Time Calculation (min): 45 min  Short Term Goals: Week 1:  OT Short Term Goal 1 (Week 1): Pt. will feed self with right hand for 75 % of meal OT Short Term Goal 2 (Week 1): Pt. will bath upper bodly with minimal assist OT Short Term Goal 3 (Week 1): Pt. will sit unsupported for 15 minutes during grooming task OT Short Term Goal 4 (Week 1): Pt. will dress LB with moderate ssist and moderate assist with balance for 1 minute.    Skilled Therapeutic Interventions/Progress Updates:    Self care retraining: Pt participated in feeding task with Mod cues for sustained attention to task in maximally distractive environment and demonstrated increased ability to initiate hand to mouth to take bite, pt has increased ability to sustain attention to familiar tasks. Pt performed dressing tasks and noted to have difficulty lifting RLE to thread pants possibly due to R sided weakness> L side. Pt performed sit<>stands with Min A and verbal cues for standing balance; pt loses balance backward when BUE unsupported due to difficulty with forward weight shift.  Therapy Documentation Precautions:  Precautions Precautions: Fall Precaution Comments:  (foley catheter, rectal tube removed) Restrictions Weight Bearing Restrictions: No Pain: Pain Assessment Pain Assessment: No/denies pain  See FIM for current functional status  Therapy/Group: Individual Therapy  Jackelyn Poling 09/16/2011, 3:03 PM

## 2011-09-16 NOTE — Progress Notes (Signed)
Speech Language Pathology Daily Session Note  Patient Details  Name: [name] MRN: [id number] Date of Birth: March 29, 1950  Today's Date: 09/16/2011 Time: 1010-1050 Time Calculation (min): 40 min  Short Term Goals: Week 1: SLP Short Term Goal 1 (Week 1): Pt will initiate functional tasks with Mod A verbal and tactile cues.  SLP Short Term Goal 2 (Week 1): Pt will demonstrate sustained attention to a functional task for ~ 5 minutes with Mod A verbal and visual cues for redirection  SLP Short Term Goal 3 (Week 1): Pt will follow 2 step commands with Max A verbal and question cues.  SLP Short Term Goal 4 (Week 1): Pt will express wants/needs at the word level with Max A verbal and question cues.  SLP Short Term Goal 5 (Week 1): Pt will orient to place, time and situation with Max A contextual, enviornmental and semantic cues.  SLP Short Term Goal 6 (Week 1): Pt will consume Dys 2 diet with thin liquids and no s/s of aspiration with Mod A for swallowing strategies SLP Short Term Goal 6 - Progress (Week 1): Updated due to goal met  Skilled Therapeutic Interventions: Pt. seen for cognitive treatment during functional food preparation activity in ADL kitchen.  She required total verbal/visual/tactile contextual cues for verbal and functional initiation. Pt. able to sustain attention in 30 second increments before needing cues from SLP's.    SLP provided moderate problem solving and sequencing during preparation of lemonade.  Pt. did not spontaneously express wants/needs during session.  She consumed cup and straw sips thin liquid with immediate throat clears and one delayed cough indicative of possible intermittently decreased airway protection.        FIM:  Comprehension Comprehension: 4-Understands basic 75 - 89% of the time/requires cueing 10 - 24% of the time Expression Expression: 1-Expresses basis less than 25% of the time/requires cueing greater than 75% of the time. Social  Interaction Social Interaction: 2-Interacts appropriately 25 - 49% of time - Needs frequent redirection. Problem Solving Problem Solving: 2-Solves basic 25 - 49% of the time - needs direction more than half the time to initiate, plan or complete simple activities Memory Memory: 2-Recognizes or recalls 25 - 49% of the time/requires cueing 51 - 75% of the time FIM - Eating Eating Activity: 5: Needs verbal cues/supervision  Pain Pain Assessment Pain Assessment: No/denies pain  Therapy/Group: Individual Therapy  Royce Macadamia 09/16/2011, 11:24 AM

## 2011-09-16 NOTE — Progress Notes (Signed)
Subjective/Complaints: Slept well. Husband at bedside A 12 point review of systems has been performed and if not noted above is otherwise negative.  Objective: Vital Signs: Blood pressure 122/77, pulse 75, temperature 98.9 F (37.2 C), temperature source Oral, resp. rate 18, height 5\' 6"  (1.676 m), weight 94.1 kg (207 lb 7.3 oz), SpO2 98.00%. No results found.  Basename 09/14/11 0628  WBC 6.6  HGB 9.3*  HCT 30.8*  PLT 116*    Basename 09/16/11 0605 09/15/11 0600  NA 147* 149*  K 3.8 3.6  CL 116* 118*  CO2 22 21  GLUCOSE 159* 125*  BUN 11 15  CREATININE 0.70 0.73  CALCIUM 9.0 8.9   CBG (last 3)   Basename 09/16/11 0721 09/16/11 0343 09/15/11 1139  GLUCAP 153* 153* 170*    Wt Readings from Last 3 Encounters:  09/16/11 94.1 kg (207 lb 7.3 oz)  09/07/11 98.7 kg (217 lb 9.5 oz)  09/07/11 98.7 kg (217 lb 9.5 oz)    Physical Exam:  HENT: Eyes:  Pupils are reactive to light and  Neck: No thyromegaly present.  Cardiovascular: Normal rate and regular rhythm.  Pulmonary/Chest: Breath sounds normal. No respiratory distress. She has no wheezes.  Abdominal: Bowel sounds are normal. She exhibits no distension. There is no tenderness.  Neurological:  Patient with flat affect. More alert today again. A little emotion was shown today. Still Very slow to process. . She does withdrawal to deep stimulihowever sensory testing limited by her mental status  Upper extremities have 4 minus grip arm flexion extension.  Right lower extremitytrace active movement although once again not clear whether or not she is fully aware of commands she does have right knee pain with range of motion  Left lower extremity has 2 minus hip knee extensor synergy  Right ankle less tender and warm. Right knee improved Skin intact   Assessment/Plan: 1. Functional deficits secondary to SAH/ACA aneurysm with hydrocephalus and VPS which require 3+ hours per day of interdisciplinary therapy in a comprehensive  inpatient rehab setting. Physiatrist is providing close team supervision and 24 hour management of active medical problems listed below. Physiatrist and rehab team continue to assess barriers to discharge/monitor patient progress toward functional and medical goals. FIM: FIM - Bathing Bathing Steps Patient Completed: Chest;Right Arm;Left Arm;Abdomen;Front perineal area;Right upper leg;Left upper leg;Left lower leg (including foot) Bathing: 4: Min-Patient completes 8-9 8f 10 parts or 75+ percent  FIM - Upper Body Dressing/Undressing Upper body dressing/undressing steps patient completed: Thread/unthread right bra strap;Thread/unthread left bra strap;Thread/unthread right sleeve of pullover shirt/dresss;Thread/unthread left sleeve of pullover shirt/dress;Put head through opening of pull over shirt/dress;Pull shirt over trunk Upper body dressing/undressing: 4: Min-Patient completed 75 plus % of tasks FIM - Lower Body Dressing/Undressing Lower body dressing/undressing steps patient completed: Thread/unthread left pants leg;Pull pants up/down;Fasten/unfasten pants;Fasten/unfasten left shoe Lower body dressing/undressing: 3: Mod-Patient completed 50-74% of tasks  FIM - Toileting Toileting: 0: No continent bowel/bladder events this shift  FIM - Archivist Transfers: 0-Activity did not occur;4-To toilet/BSC: Min A (steadying Pt. > 75%);4-From toilet/BSC: Min A (steadying Pt. > 75%)  FIM - Banker Devices: Walker;Arm rests Bed/Chair Transfer: 4: Bed > Chair or W/C: Min A (steadying Pt. > 75%);4: Supine > Sit: Min A (steadying Pt. > 75%/lift 1 leg)  FIM - Locomotion: Wheelchair Distance: 40 Locomotion: Wheelchair: 1: Travels less than 50 ft with moderate assistance (Pt: 50 - 74%) FIM - Locomotion: Ambulation Locomotion: Ambulation Assistive Devices: Designer, industrial/product  Ambulation/Gait Assistance: 2: Max assist Locomotion: Ambulation: 1: Travels  less than 50 ft with moderate assistance (Pt: 50 - 74%)  Comprehension Comprehension Mode: Auditory Comprehension: 3-Understands basic 50 - 74% of the time/requires cueing 25 - 50%  of the time  Expression Expression Mode: Verbal Expression: 2-Expresses basic 25 - 49% of the time/requires cueing 50 - 75% of the time. Uses single words/gestures.  Social Interaction Social Interaction: 2-Interacts appropriately 25 - 49% of time - Needs frequent redirection.  Problem Solving Problem Solving: 1-Solves basic less than 25% of the time - needs direction nearly all the time or does not effectively solve problems and may need a restraint for safety  Memory Memory: 2-Recognizes or recalls 25 - 49% of the time/requires cueing 51 - 75% of the time Medical Problem List and Plan:  1. subarachnoid hemorrhage with small infarct left internal capsule: Status post IVC placement 08/18/2011 with coiling of aneurysm 08/19/2011 and VP shunt placement 09/02/2011  2. DVT Prophylaxis/Anticoagulation: SCDs. Monitor for any signs of DVT  3. Pain Management: Tylenol for now. Monitor with increased mobility. Right knee and right ankle pain we'll check x-rays likely degenerative changes, no recent trauma  4. Neuropsych: This patient is not capable of making decisions on his/her own behalf.  -affect very flat. Trial ritalin initiated and no bp elevation thus far. Think this is more neuro than behavioral at this point  -mood assessment.    -rx hypernatremia. Improved further today  -dc ivf  -recheck on Monday  -ucx with multispecies 5. Dysphagia. Dysphagia 1 nectar thick liquids. Follow per speech therapy. Monitor for any signs of aspiration  6. Seizure prophylaxis. Keppra 500 mg twice a day  7. Hypertension. Hydralazine 20 mg 3 times a day, labetalol 200 mg 3 times a day, name of top 60 mg every 4 hours. Good control at present. 8. Hypernatremia/hypovolemic. ivf as above 9. Hyperglycemia.  Check blood sugars a.c.  and at bedtime. hgb A1c elevated slight 10. Right ankle pain: gout  -added colchicine which seems to have helped. 11.FEN: megace trial.   -recheck labs again tomorrow  LOS (Days) 8 A FACE TO FACE EVALUATION WAS PERFORMED  Zi Sek T 09/16/2011, 7:44 AM

## 2011-09-16 NOTE — Progress Notes (Signed)
Speech Language Pathology Weekly Progress Note  Patient Details  Name: [name] MRN: [id number] Date of Birth: 03-07-1950  Today's Date: 09/16/2011  Short Term Goals: Week 1: SLP Short Term Goal 1 (Week 1): Pt will initiate functional tasks with Mod A verbal and tactile cues.  SLP Short Term Goal 1 - Progress (Week 1): Not met SLP Short Term Goal 2 (Week 1): Pt will demonstrate sustained attention to a functional task for ~ 5 minutes with Mod A verbal and visual cues for redirection  SLP Short Term Goal 2 - Progress (Week 1): Not met SLP Short Term Goal 3 (Week 1): Pt will follow 2 step commands with Max A verbal and question cues.  SLP Short Term Goal 3 - Progress (Week 1): Met SLP Short Term Goal 4 (Week 1): Pt will express wants/needs at the word level with Max A verbal and question cues.  SLP Short Term Goal 4 - Progress (Week 1): Not met SLP Short Term Goal 5 (Week 1): Pt will orient to place, time and situation with Max A contextual, enviornmental and semantic cues.  SLP Short Term Goal 5 - Progress (Week 1): Not met SLP Short Term Goal 6 (Week 1): Pt will consume Dys 2 diet with thin liquids and no s/s of aspiration with Mod A for swallowing strategies SLP Short Term Goal 6 - Progress (Week 1): Not met  New Short Term Goals:  Week 2: SLP Short Term Goal 1 (Week 2): Pt will consume dys. 2 textures with thin liquids without overt s/s of aspiration with Mod verbal cues for utilization of swallowing compensatory strategies SLP Short Term Goal 2 (Week 2): Pt will orient to place, time and situation with Max A contextual, enviornmental and semantic cues.  SLP Short Term Goal 3 (Week 2): Pt will epxress wants/needs at the word level with Max A question and verbal cues.  SLP Short Term Goal 4 (Week 2): Pt will demonstrate sustained attention to a functional task for 2 minutes with Max verbal cues for redirection  SLP Short Term Goal 5 (Week 2): Pt will initiate functional tasks with  Max A verbal and tactile cues.   Weekly Progress Updates: Pt has met  2 out of 6 STG's this reporting period. Pt administered MBS and was upgraded to Dys. 2 textures and thin liquids with Max A verbal cues for initiation and attention to meal. Pt is currently overall total A for initiation of functional tasks, functional problem solving, sustained attention, working memory and orientation.  Pt also requires total-Max A for verbal expression of wants/needs but id demonstrating a brighter affect and increased social responses.  Pt/ family education ongoing.  Pt would benefit from continued skilled SLP intervention to maximize cognitive function and overall independence.    SLP Frequency: 1-2 X/day, 30-60 minutes Estimated Length of Stay: 3 weeks SLP Treatment/Interventions: Cueing hierarchy;Cognitive remediation/compensation;Dysphagia/aspiration precaution training;Neuromuscular electrical stimulation;Therapeutic Activities;Patient/family education;Environmental controls;Functional tasks;Internal/external aids;Multimodal communication approach  Daily Session FIM:  Comprehension Comprehension Mode: Auditory Comprehension: 3-Understands basic 50 - 74% of the time/requires cueing 25 - 50%  of the time Expression Expression Mode: Verbal Expression: 1-Expresses basis less than 25% of the time/requires cueing greater than 75% of the time. Social Interaction Social Interaction: 1-Interacts appropriately less than 25% of the time. May be withdrawn or combative. Problem Solving Problem Solving: 2-Solves basic 25 - 49% of the time - needs direction more than half the time to initiate, plan or complete simple activities Memory Memory: 1-Recognizes or  recalls less than 25% of the time/requires cueing greater than 75% of the time FIM - Eating Eating Activity: 5: Set-up assist for open containers;5: Supervision/cues  Pain Pain Assessment Pain Assessment: No/denies pain    Devine Klingel 09/16/2011,  4:42 PM

## 2011-09-16 NOTE — Progress Notes (Addendum)
Occupational Therapy Note  Patient Details  Name: [name] MRN: [id number] Date of Birth: 1950/08/30 Today's Date: 09/16/2011 Time:  1130-1200 Pain:  None Group session  Engaged in therapeutic feeding group.  Pt utilized RUE during session but mostly relied on LUE for feeding self.   She showed decreased initiation with eating and responded well to verbal prompts to finish her meal.   Pt. answered 2 questions in course of 60 min session with no delay; all the others were not answered due to response delay.  .  Pt. Communicated more with facial expressions as people made comments.    She maintained sustained attention with moderate cues.      Humberto Seals 09/16/2011, 5:59 PM

## 2011-09-16 NOTE — Progress Notes (Signed)
This note has been reviewed and this clinician agrees with information provided.  

## 2011-09-17 ENCOUNTER — Inpatient Hospital Stay (HOSPITAL_COMMUNITY): Payer: Medicaid Other | Admitting: Speech Pathology

## 2011-09-17 ENCOUNTER — Inpatient Hospital Stay (HOSPITAL_COMMUNITY): Payer: Medicaid Other | Admitting: *Deleted

## 2011-09-17 LAB — GLUCOSE, CAPILLARY
Glucose-Capillary: 183 mg/dL — ABNORMAL HIGH (ref 70–99)
Glucose-Capillary: 131 mg/dL — ABNORMAL HIGH (ref 70–99)
Glucose-Capillary: 146 mg/dL — ABNORMAL HIGH (ref 70–99)

## 2011-09-17 MED ORDER — SODIUM CHLORIDE 0.9 % IJ SOLN
10.0000 mL | INTRAMUSCULAR | Status: DC | PRN
Start: 2011-09-17 — End: 2011-10-04
  Administered 2011-09-17 (×2): 10 mL
  Administered 2011-09-17: 20 mL
  Administered 2011-09-18 (×2): 10 mL
  Administered 2011-09-20 – 2011-09-26 (×3): 20 mL

## 2011-09-17 NOTE — Progress Notes (Signed)
Occupational Therapy Note  Patient Details  Name: [name] MRN: [id number] Date of Birth: 1950-10-25 Today's Date: 09/17/2011  Time:  0930-1015  ( ) Individual Session Pain:  None  Pt. Engaged in feeding self with Right hand, supine with HOB raised 45 degrees.  Finished Breakfast. Focused rest of session on bed mobility, transfers, sit to stand, standing balance, sustained attention, using RUE/RLE, voicing needs.  Pt. Sat on EOB with mod assist.  Transferred to wc with mod assist and one strong lateral flexion of upper trunk to left during tranfer.  No knee buckled.  Husband present and educated to wc parts and he assisted with sit to stand during bathing and dressing.  Pt. Stood at sink for 2 minutes during pericare.  She washed anterior peri area with moderate to minimal assist for balance.  She was incontinent of bowel during session.  She needed increased time for each activity, but has shown good improvements.  She spoke about 3 words during session.    Humberto Seals 09/17/2011, 5:11 PM

## 2011-09-17 NOTE — Progress Notes (Signed)
Patient ID: [name], female   DOB: [birth year], 61 y.o.   MRN: [id number] Subjective/Complaints:  9/14 Slept well. Husband at bedside-  states that she had a very good day yesterday eating well and feeding herself A 12 point review of systems has been performed and if not noted above is otherwise negative.  Gen. sleeping but able to arouse; chest clear anterolaterally. Cardiovascular exam normal heart sounds no tachycardia abdomen benign; Foley catheter in place;  extremities unremarkable Skin healing scalp incision right parietal area with staples in place  CBG (last 3)   Basename 09/17/11 0738 09/16/11 2054 09/16/11 1643  GLUCAP 116* 189* 145*    Objective: Vital Signs: Blood pressure 167/83, pulse 67, temperature 98.4 F (36.9 C), temperature source Oral, resp. rate 19, height 5\' 6"  (1.676 m), weight 94.1 kg (207 lb 7.3 oz), SpO2 99.00%. No results found. No results found for this basename: WBC:2,HGB:2,HCT:2,PLT:2 in the last 72 hours  Basename 09/16/11 0605 09/15/11 0600  NA 147* 149*  K 3.8 3.6  CL 116* 118*  CO2 22 21  GLUCOSE 159* 125*  BUN 11 15  CREATININE 0.70 0.73  CALCIUM 9.0 8.9   CBG (last 3)   Basename 09/17/11 0738 09/16/11 2054 09/16/11 1643  GLUCAP 116* 189* 145*    Wt Readings from Last 3 Encounters:  09/16/11 94.1 kg (207 lb 7.3 oz)  09/07/11 98.7 kg (217 lb 9.5 oz)  09/07/11 98.7 kg (217 lb 9.5 oz)    Physical Exam:  HENT: Eyes:  Pupils are reactive to light and  Neck: No thyromegaly present.  Cardiovascular: Normal rate and regular rhythm.  Pulmonary/Chest: Breath sounds normal. No respiratory distress. She has no wheezes.  Abdominal: Bowel sounds are normal. She exhibits no distension. There is no tenderness.  Neurological:  Patient with flat affect. More alert today again. A little emotion was shown today. Still Very slow to process. . She does withdrawal to deep stimulihowever sensory testing limited by her mental status  Upper  extremities have 4 minus grip arm flexion extension.  Right lower extremitytrace active movement although once again not clear whether or not she is fully aware of commands she does have right knee pain with range of motion  Left lower extremity has 2 minus hip knee extensor synergy  Right ankle less tender and warm. Right knee improved Skin intact   Assessment/Plan: 1. Functional deficits secondary to SAH/ACA aneurysm with hydrocephalus and VPS which require 3+ hours per day of interdisciplinary therapy in a comprehensive inpatient rehab setting. Physiatrist is providing close team supervision and 24 hour management of active medical problems listed below. Physiatrist and rehab team continue to assess barriers to discharge/monitor patient progress toward functional and medical goals. FIM: FIM - Bathing Bathing Steps Patient Completed: Chest;Right Arm;Left Arm;Abdomen;Front perineal area;Right upper leg;Left upper leg;Left lower leg (including foot) Bathing: 4: Min-Patient completes 8-9 8f 10 parts or 75+ percent  FIM - Upper Body Dressing/Undressing Upper body dressing/undressing steps patient completed: Thread/unthread left sleeve of pullover shirt/dress;Put head through opening of pull over shirt/dress;Pull shirt over trunk;Thread/unthread right sleeve of pullover shirt/dresss Upper body dressing/undressing: 5: Set-up assist to: Obtain clothing/put away FIM - Lower Body Dressing/Undressing Lower body dressing/undressing steps patient completed: Thread/unthread left pants leg;Pull pants up/down Lower body dressing/undressing: 2: Max-Patient completed 25-49% of tasks  FIM - Toileting Toileting: 0: No continent bowel/bladder events this shift  FIM - Archivist Transfers: 0-Activity did not occur;4-To toilet/BSC: Min A (steadying Pt. > 75%);4-From toilet/BSC: Min  A (steadying Pt. > 75%)  FIM - Banker Devices: Walker;Arm  rests Bed/Chair Transfer: 3: Chair or W/C > Bed: Mod A (lift or lower assist);3: Bed > Chair or W/C: Mod A (lift or lower assist)  FIM - Locomotion: Wheelchair Distance: 40 Locomotion: Wheelchair: 1: Travels less than 50 ft with moderate assistance (Pt: 50 - 74%) FIM - Locomotion: Ambulation Locomotion: Ambulation Assistive Devices: Designer, industrial/product Ambulation/Gait Assistance: 2: Max assist Locomotion: Ambulation: 1: Travels less than 50 ft with moderate assistance (Pt: 50 - 74%)  Comprehension Comprehension Mode: Auditory Comprehension: 3-Understands basic 50 - 74% of the time/requires cueing 25 - 50%  of the time  Expression Expression Mode: Verbal Expression: 1-Expresses basis less than 25% of the time/requires cueing greater than 75% of the time.  Social Interaction Social Interaction: 1-Interacts appropriately less than 25% of the time. May be withdrawn or combative.  Problem Solving Problem Solving: 2-Solves basic 25 - 49% of the time - needs direction more than half the time to initiate, plan or complete simple activities  Memory Memory: 1-Recognizes or recalls less than 25% of the time/requires cueing greater than 75% of the time Medical Problem List and Plan:  1. subarachnoid hemorrhage with small infarct left internal capsule: Status post IVC placement 08/18/2011 with coiling of aneurysm 08/19/2011 and VP shunt placement 09/02/2011  2. DVT Prophylaxis/Anticoagulation: SCDs. Monitor for any signs of DVT  3. Pain Management: Tylenol for now. Monitor with increased mobility. Right knee and right ankle pain we'll check x-rays likely degenerative changes, no recent trauma  4. Neuropsych: This patient is not capable of making decisions on his/her own behalf.  -affect very flat. Trial ritalin initiated and no bp elevation thus far. Think this is more neuro than behavioral at this point  -mood assessment.    -rx hypernatremia. Improved further today  -dc ivf  -recheck on  Monday  -ucx with multispecies 5. Dysphagia. Dysphagia 1 nectar thick liquids. Follow per speech therapy. Monitor for any signs of aspiration  6. Seizure prophylaxis. Keppra 500 mg twice a day  7. Hypertension. Hydralazine 20 mg 3 times a day, labetalol 200 mg 3 times a day, name of top 60 mg every 4 hours. Good control at present. 8. Hypernatremia/hypovolemic. ivf as above 9. Hyperglycemia.  Check blood sugars a.c. and at bedtime. hgb A1c elevated slight 10. Right ankle pain: gout  -added colchicine which seems to have helped. 11.FEN: megace trial.   -recheck labs again tomorrow  LOS (Days) 9 A FACE TO FACE EVALUATION WAS PERFORMED  Rogelia Boga 09/17/2011, 9:29 AM

## 2011-09-17 NOTE — Progress Notes (Signed)
Occupational Therapy Session Note  Patient Details  Name: [name] MRN: [id number] Date of Birth: August 03, 1950  Today's Date: 09/17/2011 Time: 1200-1225 Time Calculation (min): 25 min   Skilled Therapeutic Interventions/Progress Updates: focus in d.club was on initiation and communicating her needs, and incorporating L UE into meal.  Patient required approx 2 reminders to initiate the self feeding process.     Therapy Documentation Precautions:  Precautions Precautions: Fall Precaution Comments:  (foley catheter, rectal tube removed) Restrictions Weight Bearing Restrictions: No  Pain: Pain Assessment Pain Assessment: No/denies pain  See FIM for current functional status  Therapy/Group: Group Therapy  [name] 09/17/2011, 3:23 PM

## 2011-09-17 NOTE — Progress Notes (Addendum)
Speech Language Pathology Daily Session Note  Patient Details  Name: [name] MRN: [id number] Date of Birth: 1950-12-28  Today's Date: 09/17/2011 Time: 1130-1200 Time Calculation (min): 30 min  Short Term Goals: Week 2: SLP Short Term Goal 1 (Week 2): Pt will consume dys. 2 textures with thin liquids without overt s/s of aspiration with Mod verbal cues for utilization of swallowing compensatory strategies SLP Short Term Goal 2 (Week 2): Pt will orient to place, time and situation with Max A contextual, enviornmental and semantic cues.  SLP Short Term Goal 3 (Week 2): Pt will epxress wants/needs at the word level with Max A question and verbal cues.  SLP Short Term Goal 4 (Week 2): Pt will demonstrate sustained attention to a functional task for 2 minutes with Max verbal cues for redirection  SLP Short Term Goal 5 (Week 2): Pt will initiate functional tasks with Max A verbal and tactile cues.   Skilled Therapeutic Interventions: Co-treatment group session; SLP facilitated session with choice of two to verbally identify items needed/want during tray set up and min assist verbal and visual cues to initiate and problem solve during self-feeding task. Patient demonstrated no overt s/s of aspiration during meal. SLP recommends changing to intermittent supervision after set up.     FIM:  Comprehension Comprehension: 4-Understands basic 75 - 89% of the time/requires cueing 10 - 24% of the time Expression Expression: 1-Expresses basis less than 25% of the time/requires cueing greater than 75% of the time. Social Interaction Social Interaction: 2-Interacts appropriately 25 - 49% of time - Needs frequent redirection. Problem Solving Problem Solving: 2-Solves basic 25 - 49% of the time - needs direction more than half the time to initiate, plan or complete simple activities Memory Memory: 2-Recognizes or recalls 25 - 49% of the time/requires cueing 51 - 75% of the time FIM - Eating Eating  Activity: 5: Needs verbal cues/supervision  Pain Pain Assessment Pain Assessment: No/denies pain  Therapy/Group: Group Therapy  Charlane Ferretti., CCC-SLP [phone]  Larsen Dungan 09/17/2011, 1:17 PM

## 2011-09-18 ENCOUNTER — Inpatient Hospital Stay (HOSPITAL_COMMUNITY): Payer: Medicaid Other | Admitting: *Deleted

## 2011-09-18 LAB — GLUCOSE, CAPILLARY: Glucose-Capillary: 226 mg/dL — ABNORMAL HIGH (ref 70–99)

## 2011-09-18 MED ORDER — NIMODIPINE 60 MG/20ML PO SOLN
60.0000 mg | ORAL | Status: DC
  Administered 2011-09-19 – 2011-09-20 (×6): 60 mg via ORAL
  Administered 2011-09-20: 3 mg via ORAL
  Administered 2011-09-20 – 2011-09-27 (×34): 60 mg via ORAL
  Filled 2011-09-18 (×60): qty 20

## 2011-09-18 NOTE — Plan of Care (Signed)
Problem: RH BOWEL ELIMINATION Goal: RH STG MANAGE BOWEL WITH ASSISTANCE STG Manage Bowel with mod Assistance.   Outcome: Not Progressing incontinent   

## 2011-09-18 NOTE — Progress Notes (Signed)
Patient ID: ELIJAH MICHAELIS, female   DOB: [birth year], 61 y.o.   MRN: [id number] Patient ID: CHERRYL BABIN, female   DOB: 1950/07/07, 61 y.o.   MRN: [id number] Subjective/Complaints:  9/15. Opens eyes and seems alert but noncommunicative A 12 point review of systems has been performed and if not noted above is otherwise negative.  Gen. No distress; chest clear anterolaterally. Cardiovascular exam normal heart sounds no tachycardia abdomen benign; Foley catheter in place;  extremities unremarkable Skin healing scalp incision right parietal area with staples in place  CBG (last 3)   Basename 09/18/11 0740 09/17/11 2157 09/17/11 1631  GLUCAP 127* 146* 131*    Objective: Vital Signs: Blood pressure 170/90, pulse 63, temperature 97.8 F (36.6 C), temperature source Oral, resp. rate 16, height 5\' 6"  (1.676 m), weight 94.1 kg (207 lb 7.3 oz), SpO2 100.00%. No results found. No results found for this basename: WBC:2,HGB:2,HCT:2,PLT:2 in the last 72 hours  [facility] 09/16/11 0605  NA 147*  K 3.8  CL 116*  CO2 22  GLUCOSE 159*  BUN 11  CREATININE 0.70  CALCIUM 9.0   CBG (last 3)   Basename 09/18/11 0740 09/17/11 2157 09/17/11 1631  GLUCAP 127* 146* 131*    Wt Readings from Last 3 Encounters:  09/16/11 94.1 kg (207 lb 7.3 oz)  09/07/11 98.7 kg (217 lb 9.5 oz)  09/07/11 98.7 kg (217 lb 9.5 oz)    Physical Exam:  HENT: Eyes:  Pupils are reactive to light and  Neck: No thyromegaly present.  Cardiovascular: Normal rate and regular rhythm.  Pulmonary/Chest: Breath sounds normal. No respiratory distress. She has no wheezes.  Abdominal: Bowel sounds are normal. She exhibits no distension. There is no tenderness.  Neurological:  Patient with flat affect. More alert today again. A little emotion was shown today. Still Very slow to process. . She does withdrawal to deep stimulihowever sensory testing limited by her mental status  Upper extremities have 4 minus grip arm flexion  extension.  Right lower extremitytrace active movement although once again not clear whether or not she is fully aware of commands she does have right knee pain with range of motion  Left lower extremity has 2 minus hip knee extensor synergy  Right ankle less tender and warm. Right knee improved Skin intact   Assessment/Plan: 1. Functional deficits secondary to SAH/ACA aneurysm with hydrocephalus and VPS which require 3+ hours per day of interdisciplinary therapy in a comprehensive inpatient rehab setting. Physiatrist is providing close team supervision and 24 hour management of active medical problems listed below. Physiatrist and rehab team continue to assess barriers to discharge/monitor patient progress toward functional and medical goals. FIM: FIM - Bathing Bathing Steps Patient Completed: Chest;Right Arm;Left Arm;Abdomen;Front perineal area;Right upper leg;Left upper leg Bathing: 3: Mod-Patient completes 5-7 68f 10 parts or 50-74%  FIM - Upper Body Dressing/Undressing Upper body dressing/undressing steps patient completed: Thread/unthread right bra strap;Thread/unthread left bra strap;Thread/unthread right sleeve of pullover shirt/dresss;Thread/unthread left sleeve of pullover shirt/dress;Put head through opening of pull over shirt/dress;Pull shirt over trunk Upper body dressing/undressing: 4: Min-Patient completed 75 plus % of tasks FIM - Lower Body Dressing/Undressing Lower body dressing/undressing steps patient completed: Thread/unthread left pants leg;Pull pants up/down;Fasten/unfasten pants;Fasten/unfasten left shoe Lower body dressing/undressing: 2: Max-Patient completed 25-49% of tasks  FIM - Toileting Toileting: 0: No continent bowel/bladder events this shift  FIM - Archivist Transfers: 0-Activity did not occur  FIM - Banker Devices: Bed rails Bed/Chair Transfer: 3:  Supine > Sit: Mod A (lifting assist/Pt. 50-74%/lift 2  legs;3: Bed > Chair or W/C: Mod A (lift or lower assist)  FIM - Locomotion: Wheelchair Distance: 40 Locomotion: Wheelchair: 1: Travels less than 50 ft with moderate assistance (Pt: 50 - 74%) FIM - Locomotion: Ambulation Locomotion: Ambulation Assistive Devices: Designer, industrial/product Ambulation/Gait Assistance: 2: Max assist Locomotion: Ambulation: 1: Travels less than 50 ft with moderate assistance (Pt: 50 - 74%)  Comprehension Comprehension Mode: Auditory Comprehension: 4-Understands basic 75 - 89% of the time/requires cueing 10 - 24% of the time  Expression Expression Mode: Verbal Expression: 1-Expresses basis less than 25% of the time/requires cueing greater than 75% of the time.  Social Interaction Social Interaction: 2-Interacts appropriately 25 - 49% of time - Needs frequent redirection.  Problem Solving Problem Solving: 2-Solves basic 25 - 49% of the time - needs direction more than half the time to initiate, plan or complete simple activities  Memory Memory: 2-Recognizes or recalls 25 - 49% of the time/requires cueing 51 - 75% of the time Medical Problem List and Plan:  1. subarachnoid hemorrhage with small infarct left internal capsule: Status post IVC placement 08/18/2011 with coiling of aneurysm 08/19/2011 and VP shunt placement 09/02/2011  2. DVT Prophylaxis/Anticoagulation: SCDs. Monitor for any signs of DVT  3. Pain Management: Tylenol for now. Monitor with increased mobility. Right knee and right ankle pain we'll check x-rays likely degenerative changes, no recent trauma  4. Neuropsych: This patient is not capable of making decisions on his/her own behalf.  -affect very flat. Trial ritalin initiated and no bp elevation thus far. Think this is more neuro than behavioral at this point  -mood assessment.    -rx hypernatremia. Improved further today  -dc ivf  -recheck on Monday  -ucx with multispecies 5. Dysphagia. Dysphagia 1 nectar thick liquids. Follow per speech  therapy. Monitor for any signs of aspiration  6. Seizure prophylaxis. Keppra 500 mg twice a day  7. Hypertension. Hydralazine 20 mg 3 times a day, labetalol 200 mg 3 times a day, name of top 60 mg every 4 hours. Good control at present. 8. Hypernatremia/hypovolemic. ivf as above 9. Hyperglycemia.  Check blood sugars a.c. and at bedtime. hgb A1c elevated slight 10. Right ankle pain: gout  -added colchicine which seems to have helped. 11.FEN: megace trial.   -recheck labs again tomorrow  LOS (Days) 10 A FACE TO FACE EVALUATION WAS PERFORMED  Rogelia Boga 09/18/2011, 9:04 AM

## 2011-09-18 NOTE — Progress Notes (Signed)
Physical Therapy Session Note  Patient Details  Name: [name] MRN: [id number] Date of Birth: 03-27-1950  Today's Date: 09/18/2011 Time:   847-930 ( )   Skilled Therapeutic Interventions/Progress Updates:  Tx focused on bed mobility, safe transfer training, and gait with RW.  Assisted pt dressing upper and lower body in bed. Rolling R/L x6 with multiple cues for hand placement and technique. Performed bridging x8 with cues for technique.   Unsupported sitting EOB for balance during functional tasks x76min.  Supine/sit with Min A and step-by-step cues due to decreased attention to task.  Stand-pivot transfer with Min A only for steadying.   Gait training 1x38' and 1x80' with up to mod A and cues for increasing step clearance and length as well as posture and straight path. Pt with great muscle activation today, no episodes of knees buckling, but decreased accuracy of foot placement noted with fatigue.      Therapy Documentation Precautions:  Precautions Precautions: Fall Precaution Comments:  (foley catheter, rectal tube removed) Restrictions Weight Bearing Restrictions: No   Locomotion : Ambulation Ambulation/Gait Assistance: 3: Mod assist    See FIM for current functional status  Therapy/Group: Individual Therapy  Virl Cagey, PT 09/18/2011, 9:29 AM

## 2011-09-19 ENCOUNTER — Inpatient Hospital Stay (HOSPITAL_COMMUNITY): Payer: Medicaid Other | Admitting: Occupational Therapy

## 2011-09-19 ENCOUNTER — Inpatient Hospital Stay (HOSPITAL_COMMUNITY): Payer: Medicaid Other | Admitting: Physical Therapy

## 2011-09-19 ENCOUNTER — Inpatient Hospital Stay (HOSPITAL_COMMUNITY): Payer: Medicaid Other | Admitting: Speech Pathology

## 2011-09-19 LAB — BASIC METABOLIC PANEL
CO2: 26 mEq/L (ref 19–32)
GFR calc non Af Amer: 89 mL/min — ABNORMAL LOW (ref >90)
Glucose, Bld: 134 mg/dL — ABNORMAL HIGH (ref 70–99)
Potassium: 3.8 mEq/L (ref 3.5–5.1)
Sodium: 141 mEq/L (ref 135–145)

## 2011-09-19 MED ORDER — GLYBURIDE 2.5 MG PO TABS
2.5000 mg | ORAL_TABLET | Freq: Every day | ORAL | Status: DC
  Administered 2011-09-19 – 2011-09-25 (×7): 2.5 mg via ORAL
  Filled 2011-09-19 (×11): qty 1

## 2011-09-19 NOTE — Progress Notes (Addendum)
Speech Language Pathology Daily Session Note  Patient Details  Name: [name] MRN: [id number] Date of Birth: 11-Jun-1950  Today's Date: 09/19/2011 Time: 1200-1215 Time Calculation (min): 15 min  Short Term Goals: Week 2: SLP Short Term Goal 1 (Week 2): Pt will consume dys. 2 textures with thin liquids without overt s/s of aspiration with Mod verbal cues for utilization of swallowing compensatory strategies SLP Short Term Goal 1 - Progress (Week 2): Progressing toward goal SLP Short Term Goal 2 (Week 2): Pt will orient to place, time and situation with Max A contextual, enviornmental and semantic cues.  SLP Short Term Goal 3 (Week 2): Pt will epxress wants/needs at the word level with Max A question and verbal cues.  SLP Short Term Goal 4 (Week 2): Pt will demonstrate sustained attention to a functional task for 2 minutes with Max verbal cues for redirection  SLP Short Term Goal 5 (Week 2): Pt will initiate functional tasks with Max A verbal and tactile cues.   Skilled Therapeutic Interventions: Co-treatment group session; SLP provided initial cue for initiation of self feeding task. Patient then required supervision to continue activity, consuming recommended diet without overt s/s of aspiration. Overall, making progress with dysphagia goals. May move to intermittent supervision with pos.    FIM:  Comprehension Comprehension Mode: Auditory Comprehension: 3-Understands basic 50 - 74% of the time/requires cueing 25 - 50%  of the time Expression Expression Mode: Verbal Expression: 1-Expresses basis less than 25% of the time/requires cueing greater than 75% of the time. Social Interaction Social Interaction: 2-Interacts appropriately 25 - 49% of time - Needs frequent redirection. Problem Solving Problem Solving: 2-Solves basic 25 - 49% of the time - needs direction more than half the time to initiate, plan or complete simple activities Memory Memory: 2-Recognizes or recalls 25 - 49%  of the time/requires cueing 51 - 75% of the time FIM - Eating Eating Activity: 5: Needs verbal cues/supervision  Pain Pain Assessment Pain Assessment: No/denies pain  Therapy/Group: Group Therapy  [name] Meryl 09/19/2011, 1:21 PM

## 2011-09-19 NOTE — Progress Notes (Signed)
Occupational Therapy Session Note  Patient Details  Name: [name] MRN: [id number] Date of Birth: 01/15/1950  Today's Date: 09/19/2011 Time: 3086-5784 Time Calculation (min): 45 min  Skilled Therapeutic Interventions/Progress Updates:    Self-care retraining: session focused on bathing at shower level, dressing at sink level, sit<>stands for clothing management, and sustained attention to task and follow through with commands. Pt performed w/c<>shower chair transfer using grab bars with steadying assist and mod verbal cues, pt attends to task ~5 min with max verbal cues for organization and progression of bathing tasks. Pt had BM while in shower with no signs of awareness even after therapist made pt aware. Pt dressed self performing sit<>stands with steady assist and Min A for standing balance becoming Mod-Max A when BUE unsupported during clothing management, pt loses balance backward. Pt demonstrated difficulty with initiation during dressing tasks requiring Max verbal cues for organization of task and extended time for follow through with commands. Pt spoke ~3-4 words during entire session and unable to name the correct day of the week upon questioning and with verbal cues.  Therapy Documentation Precautions:  Precautions Precautions: Fall Precaution Comments:  (foley catheter, rectal tube removed) Restrictions Weight Bearing Restrictions: No Pain:  no c/o of pain this session See FIM for current functional status  Therapy/Group: Individual Therapy  Jackelyn Poling 09/19/2011, 12:17 PM

## 2011-09-19 NOTE — Progress Notes (Signed)
Speech Language Pathology Daily Session Note  Patient Details  Name: [name] MRN: [id number] Date of Birth: [birth year]  Today's Date: 09/19/2011 Time: 4098-1191 Time Calculation (min): 30 min  Short Term Goals: Week 2: SLP Short Term Goal 1 (Week 2): Pt will consume dys. 2 textures with thin liquids without overt s/s of aspiration with Mod verbal cues for utilization of swallowing compensatory strategies SLP Short Term Goal 1 - Progress (Week 2): Progressing toward goal SLP Short Term Goal 2 (Week 2): Pt will orient to place, time and situation with Max A contextual, enviornmental and semantic cues.  SLP Short Term Goal 3 (Week 2): Pt will epxress wants/needs at the word level with Max A question and verbal cues.  SLP Short Term Goal 4 (Week 2): Pt will demonstrate sustained attention to a functional task for 2 minutes with Max verbal cues for redirection  SLP Short Term Goal 5 (Week 2): Pt will initiate functional tasks with Max A verbal and tactile cues.   Skilled Therapeutic Interventions: Treatment focus on initiation of functional tasks and verbal expression and problem solving. Pt sequenced 4 step picture cards with 50% accuracy and Max A verbal and visual cues to self-monitor and correct errors. Pt described activity in pictures with Max A question, semantic and visual cues for verbal initiation with task, however, pt demonstrated increased social responses throughout session.    FIM:  Comprehension Comprehension Mode: Auditory Comprehension: 3-Understands basic 50 - 74% of the time/requires cueing 25 - 50%  of the time Expression Expression Mode: Verbal Expression: 1-Expresses basis less than 25% of the time/requires cueing greater than 75% of the time. Social Interaction Social Interaction: 2-Interacts appropriately 25 - 49% of time - Needs frequent redirection. Problem Solving Problem Solving: 2-Solves basic 25 - 49% of the time - needs direction more than half the  time to initiate, plan or complete simple activities Memory Memory: 2-Recognizes or recalls 25 - 49% of the time/requires cueing 51 - 75% of the time FIM - Eating Eating Activity: 5: Needs verbal cues/supervision  Pain Pain Assessment Pain Assessment: No/denies pain  Therapy/Group: Individual Therapy  Stana Bayon 09/19/2011, 5:03 PM

## 2011-09-19 NOTE — Progress Notes (Signed)
Physical Therapy Note  Patient Details  Name: [name] MRN: [id number] Date of Birth: 12-03-1950 Today's Date: 09/19/2011  Time: 1430-1500 30 minutes  No c/o pain.  Treatment in kitchen for functional task and mobility.  Pt able to gait in kitchen with min A, required mod cuing for RW control and safety, 1 LOB requiring total A to correct.  Pt required max-total cuing to name ingredients in an apple pie, able to name items needed to prepare pie with mod-max questioning and contextual cues.  Pt continues with decreased awareness and initiation of all tasks.  Individual therapy   DONAWERTH,KAREN 09/19/2011, 5:22 PM

## 2011-09-19 NOTE — Progress Notes (Signed)
Occupational Therapy Session Note  Patient Details  Name: [name] MRN: [id number] Date of Birth: 1950/09/10  Today's Date: 09/19/2011 Time: 1130-1200 Time Calculation (min): 30 min  Skilled Therapeutic Interventions/Progress Updates:    Pt seen this afternoon for group therapy session in [employer] with focus on RUE functional use, self feeding and initiation as well as communicating her needs. Patient required occasional VC's to initiate the self feeding process. She was noted to use her left hand initially and after min VC's to use RUE, she was able to do so.  Therapy Documentation Precautions:  Precautions Precautions: Fall Precaution Comments:  (foley catheter, rectal tube removed) Restrictions Weight Bearing Restrictions: No     Pain: No c/o         See FIM for current functional status  Therapy/Group: Group Therapy  Charletta Cousin, Amy Beth Dixon 09/19/2011, 12:31 PM

## 2011-09-19 NOTE — Progress Notes (Signed)
Inpatient Diabetes Program Recommendations  AACE/ADA: New Consensus Statement on Inpatient Glycemic Control (2013)  Target Ranges:  Prepandial:   less than 140 mg/dL      Peak postprandial:   less than 180 mg/dL (1-2 hours)      Critically ill patients:  140 - 180 mg/dL   Reason for Visit: Post-prandial hyperglycemia  Inpatient Diabetes Program Recommendations Correction (SSI): xxxx Oral Agents: Increase Diabeta/Glyburide to 5 mg bid HgbA1C: xxxx  Note:Thank you, Lenor Coffin, RN, CNS, Diabetes Coordinator [phone])

## 2011-09-19 NOTE — Progress Notes (Signed)
Subjective/Complaints: Up with therapy already today. A 12 point review of systems has been performed and if not noted above is otherwise negative.  Objective: Vital Signs: Blood pressure 148/78, pulse 67, temperature 98.9 F (37.2 C), temperature source Oral, resp. rate 20, height 5\' 6"  (1.676 m), weight 92 kg (202 lb 13.2 oz), SpO2 97.00%. No results found. No results found for this basename: WBC:2,HGB:2,HCT:2,PLT:2 in the last 72 hours No results found for this basename: NA:2,K:2,CL:2,CO2:2,GLUCOSE:2,BUN:2,CREATININE:2,CALCIUM:2 in the last 72 hours CBG (last 3)   Basename 09/18/11 2144 09/18/11 1654 09/18/11 1144  GLUCAP 226* 136* 272*    Wt Readings from Last 3 Encounters:  09/19/11 92 kg (202 lb 13.2 oz)  09/07/11 98.7 kg (217 lb 9.5 oz)  09/07/11 98.7 kg (217 lb 9.5 oz)    Physical Exam:  HENT: Eyes:  Pupils are reactive to light and  Neck: No thyromegaly present.  Cardiovascular: Normal rate and regular rhythm.  Pulmonary/Chest: Breath sounds normal. No respiratory distress. She has no wheezes.  Abdominal: Bowel sounds are normal. She exhibits no distension. There is no tenderness.  Neurological:  Patient with flat affect. More alert today again. A little emotion was shown today. Still Very slow to process. . She does withdrawal to deep stimulihowever sensory testing limited by her mental status  Upper extremities have 4 minus grip arm flexion extension.  Right lower extremitytrace active movement although once again not clear whether or not she is fully aware of commands she does have right knee pain with range of motion  Left lower extremity has 2 minus hip knee extensor synergy  Right ankle less tender and warm. Right knee improved Skin intact   Assessment/Plan: 1. Functional deficits secondary to SAH/ACA aneurysm with hydrocephalus and VPS which require 3+ hours per day of interdisciplinary therapy in a comprehensive inpatient rehab setting. Physiatrist is  providing close team supervision and 24 hour management of active medical problems listed below. Physiatrist and rehab team continue to assess barriers to discharge/monitor patient progress toward functional and medical goals. FIM: FIM - Bathing Bathing Steps Patient Completed: Chest;Right Arm;Left Arm;Abdomen;Front perineal area;Right upper leg;Left upper leg Bathing: 3: Mod-Patient completes 5-7 72f 10 parts or 50-74%  FIM - Upper Body Dressing/Undressing Upper body dressing/undressing steps patient completed: Thread/unthread right bra strap;Thread/unthread left bra strap;Hook/unhook bra;Thread/unthread right sleeve of pullover shirt/dresss;Thread/unthread left sleeve of pullover shirt/dress;Put head through opening of pull over shirt/dress;Pull shirt over trunk Upper body dressing/undressing: 4: Min-Patient completed 75 plus % of tasks FIM - Lower Body Dressing/Undressing Lower body dressing/undressing steps patient completed: Thread/unthread right pants leg;Thread/unthread left pants leg;Pull pants up/down Lower body dressing/undressing: 2: Max-Patient completed 25-49% of tasks  FIM - Toileting Toileting: 0: No continent bowel/bladder events this shift  FIM - Archivist Transfers: 0-Activity did not occur  FIM - Banker Devices: Bed rails;Walker Bed/Chair Transfer: 3: Bed > Chair or W/C: Mod A (lift or lower assist);3: Chair or W/C > Bed: Mod A (lift or lower assist)  FIM - Locomotion: Wheelchair Distance: 40 Locomotion: Wheelchair: 1: Travels less than 50 ft with moderate assistance (Pt: 50 - 74%) FIM - Locomotion: Ambulation Locomotion: Ambulation Assistive Devices: Designer, industrial/product Ambulation/Gait Assistance: 3: Mod assist Locomotion: Ambulation: 2: Travels 50 - 149 ft with moderate assistance (Pt: 50 - 74%)  Comprehension Comprehension Mode: Auditory Comprehension: 4-Understands basic 75 - 89% of the time/requires cueing 10  - 24% of the time  Expression Expression Mode: Verbal Expression: 1-Expresses basis less than  25% of the time/requires cueing greater than 75% of the time.  Social Interaction Social Interaction: 2-Interacts appropriately 25 - 49% of time - Needs frequent redirection.  Problem Solving Problem Solving: 2-Solves basic 25 - 49% of the time - needs direction more than half the time to initiate, plan or complete simple activities  Memory Memory: 2-Recognizes or recalls 25 - 49% of the time/requires cueing 51 - 75% of the time Medical Problem List and Plan:  1. subarachnoid hemorrhage with small infarct left internal capsule: Status post IVC placement 08/18/2011 with coiling of aneurysm 08/19/2011 and VP shunt placement 09/02/2011  2. DVT Prophylaxis/Anticoagulation: SCDs. Monitor for any signs of DVT  3. Pain Management: Tylenol for now. Monitor with increased mobility. Right knee and right ankle pain we'll check x-rays likely degenerative changes, no recent trauma  4. Neuropsych: This patient is not capable of making decisions on his/her own behalf.  -affect very flat. Trial ritalin initiated and no bp elevation thus far. Think this is more neuro than behavioral at this point  -mood assessment.    -rx hypernatremia. Improved further today  -dc'ed ivf  -recheck Tuesday  -ucx with multispecies 5. Dysphagia. Dysphagia 1 nectar thick liquids. Follow per speech therapy. Monitor for any signs of aspiration  6. Seizure prophylaxis. Keppra 500 mg twice a day  7. Hypertension. Hydralazine 20 mg 3 times a day, labetalol 200 mg 3 times a day, name of top 60 mg every 4 hours. Good control at present. 8. Hypernatremia/hypovolemic. ivf as above 9. Hyperglycemia.  Check blood sugars a.c. and at bedtime. hgb A1c elevated slight. Probably borderline diabetic. Add low dose glyburide before i alter diet (dont' want to affect appetite) 10. Right ankle pain: gout  -added colchicine which seems to have  helped. 11.FEN: megace trial.   -recheck labs again tomorrow  LOS (Days) 11 A FACE TO FACE EVALUATION WAS PERFORMED  SWARTZ,ZACHARY T 09/19/2011, 8:56 AM

## 2011-09-19 NOTE — Progress Notes (Signed)
This note has been reviewed and this clinician agrees with information provided.  

## 2011-09-19 NOTE — Progress Notes (Signed)
Physical Therapy Note  Patient Details  Name: [name] MRN: [id number] Date of Birth: 10-Jan-1950 Today's Date: 09/19/2011  Time: 800-845 45 minutes  No c/o pain.  Bed mobility with focus on pt problem solving and sequencing with mod tactile and verbal cues.  Sit to stand transfers multiple repetitions with pt requiring max cuing for UE placement each attempt.  Gait with RW 72' with mod A, manual facilitation for wt shifting and assist to advance R LE.  Stair training x 4 stairs with mod A, max cuing for sequencing and safety.  Standing balance training with functional bend and reaching task with max cuing for motor planning and sequencing, min A for static standing balance.  Pt limited by decreased focused attention this session < 10 seconds.  Individual therapy   [name] 09/19/2011, 8:43 AM

## 2011-09-19 NOTE — Progress Notes (Signed)
Pt incontinent stool this am per OT; LBM had been 9/13; reports stool soft, note for bulking agent. Foley patent, urine with strong foul odor,cloudy with sediment.  Scalp staples d/c'd per orders.

## 2011-09-20 ENCOUNTER — Inpatient Hospital Stay (HOSPITAL_COMMUNITY): Payer: Medicaid Other | Admitting: Occupational Therapy

## 2011-09-20 ENCOUNTER — Inpatient Hospital Stay (HOSPITAL_COMMUNITY): Payer: Medicaid Other | Admitting: Physical Therapy

## 2011-09-20 ENCOUNTER — Inpatient Hospital Stay (HOSPITAL_COMMUNITY): Payer: Medicaid Other | Admitting: Speech Pathology

## 2011-09-20 LAB — GLUCOSE, CAPILLARY
Glucose-Capillary: 138 mg/dL — ABNORMAL HIGH (ref 70–99)
Glucose-Capillary: 99 mg/dL (ref 70–99)

## 2011-09-20 LAB — BASIC METABOLIC PANEL
CO2: 25 mEq/L (ref 19–32)
Calcium: 9.2 mg/dL (ref 8.4–10.5)
Chloride: 108 mEq/L (ref 96–112)
Creatinine, Ser: 0.74 mg/dL (ref 0.50–1.10)
GFR calc Af Amer: >90 mL/min (ref >90)
Sodium: 142 mEq/L (ref 135–145)

## 2011-09-20 MED ORDER — METHYLPHENIDATE HCL 5 MG PO TABS
15.0000 mg | ORAL_TABLET | Freq: Two times a day (BID) | ORAL | Status: DC
  Administered 2011-09-21 (×2): 15 mg via ORAL
  Filled 2011-09-20 (×2): qty 3

## 2011-09-20 MED ORDER — LEVETIRACETAM 250 MG PO TABS
250.0000 mg | ORAL_TABLET | Freq: Two times a day (BID) | ORAL | Status: DC
  Administered 2011-09-20 – 2011-09-21 (×2): 250 mg via ORAL
  Filled 2011-09-20 (×4): qty 1

## 2011-09-20 NOTE — Progress Notes (Signed)
Speech Language Pathology Daily Session Note  Patient Details  Name: [name] MRN: [id number] Date of Birth: April 21, 1950  Today's Date: 09/20/2011 Time: 1200-1215 Time Calculation (min): 15 min  Short Term Goals: Week 2: SLP Short Term Goal 1 (Week 2): Pt will consume dys. 2 textures with thin liquids without overt s/s of aspiration with Mod verbal cues for utilization of swallowing compensatory strategies SLP Short Term Goal 1 - Progress (Week 2): Progressing toward goal SLP Short Term Goal 2 (Week 2): Pt will orient to place, time and situation with Max A contextual, enviornmental and semantic cues.  SLP Short Term Goal 3 (Week 2): Pt will epxress wants/needs at the word level with Max A question and verbal cues.  SLP Short Term Goal 4 (Week 2): Pt will demonstrate sustained attention to a functional task for 2 minutes with Max verbal cues for redirection  SLP Short Term Goal 5 (Week 2): Pt will initiate functional tasks with Max A verbal and tactile cues.   Skilled Therapeutic Interventions: Co-treatment group session; SLP facilitated the session with supervision cues to complete a self-feeding task, consuming recommended diet without overt s/s of aspiration.  Overall, making progress with dysphagia goals.  SLP also facilitated the session with max assist verbal and visual cues to self-monitor and correct errors during a basic functional task.  Patient continued to demonstrate decreased social responses throughout session.   FIM:  Comprehension Comprehension Mode: Auditory Comprehension: 4-Understands basic 75 - 89% of the time/requires cueing 10 - 24% of the time Expression Expression: 1-Expresses basis less than 25% of the time/requires cueing greater than 75% of the time. Social Interaction Social Interaction: 4-Interacts appropriately 75 - 89% of the time - Needs redirection for appropriate language or to initiate interaction. Problem Solving Problem Solving: 2-Solves basic  25 - 49% of the time - needs direction more than half the time to initiate, plan or complete simple activities Memory Memory: 2-Recognizes or recalls 25 - 49% of the time/requires cueing 51 - 75% of the time FIM - Eating Eating Activity: 5: Supervision/cues;5: Set-up assist for open containers;5: Needs verbal cues/supervision  Pain Pain Assessment Pain Assessment: No/denies pain  Therapy/Group: Group Therapy  Jackalyn Lombard, Conrad Williams  Graduate Clinician Speech Language Pathology  Page, Joni Reining 09/20/2011, 1:23 PM  The above skilled treatment note has been reviewed and SLP is in agreement. Fae Pippin, M.A., CCC-SLP [phone]

## 2011-09-20 NOTE — Progress Notes (Signed)
Physical Therapy Note  Patient Details  Name: [name] MRN: [id number] Date of Birth: August 29, 1950 Today's Date: 09/20/2011  Time: 1330-1430 60 minutes  No c/o pain.  Pt participated in activity making an apple pie.  Pt gait with RW in kitchen with min A, mod-max cuing for safety with turning, sidestepping, and retrieving items from cabinet.  Pt attended to activity of peeling and cutting apples x 15 minutes with cuing <10% of the time.  Pt required max cuing to initiate each new step of the task.  Pt able to verbalize ingredients needed or next steps with mod-max cuing.  Pt performed standing tolerance to wash dishes x 5 minutes with min A.  Pt continues with decreased initiation but improving activity tolerance and attention noted this session.  Individual therapy    Ermina Oberman 09/20/2011, 2:36 PM

## 2011-09-20 NOTE — Progress Notes (Signed)
Occupational Therapy Session Note  Patient Details  Name: [name] MRN: [id number] Date of Birth: 28-May-1950  Today's Date: 09/20/2011 Time: 1130-1200 Time Calculation (min): 30 min  Skilled Therapeutic Interventions/Progress Updates:    Pt seen this afternoon for group therapy session in [employer] with focus on task initiation, RUE functional use as dominant extremity, self feeding and attention to task, communicating her needs. Pt often required VC's to initiate self feeding process but benefits from increased time for tasks & simple cueing. Pt was noted to open splenda packets today and dump them onto her plate. When given questional cuing of what do you do next?, pt shrugged her shoulders & said "I don't know". She was then cued to use her spoon and put it into her iced tea, she scooped it up and attempted to eat it plain. Pt eventually required assistance to put it into her tea.    Therapy Documentation Precautions:  Precautions Precautions: Fall Precaution Comments:  (foley catheter, rectal tube removed) Restrictions Weight Bearing Restrictions: No     Pain: Pain Assessment Pain Assessment: No/denies pain       See FIM for current functional status  Therapy/Group: Group Therapy  Charletta Cousin, Alanya Vukelich Beth Dixon 09/20/2011, 1:17 PM

## 2011-09-20 NOTE — Progress Notes (Signed)
Speech Language Pathology Daily Session Note  Patient Details  Name: [name] MRN: [id number] Date of Birth: 08-02-1950  Today's Date: 09/20/2011 Time: 0930-1030 Time Calculation (min): 60 min  Short Term Goals: Week 2: SLP Short Term Goal 1 (Week 2): Pt will consume dys. 2 textures with thin liquids without overt s/s of aspiration with Mod verbal cues for utilization of swallowing compensatory strategies SLP Short Term Goal 1 - Progress (Week 2): Progressing toward goal SLP Short Term Goal 2 (Week 2): Pt will orient to place, time and situation with Max A contextual, enviornmental and semantic cues.  SLP Short Term Goal 3 (Week 2): Pt will epxress wants/needs at the word level with Max A question and verbal cues.  SLP Short Term Goal 4 (Week 2): Pt will demonstrate sustained attention to a functional task for 2 minutes with Max verbal cues for redirection  SLP Short Term Goal 5 (Week 2): Pt will initiate functional tasks with Max A verbal and tactile cues.   Skilled Therapeutic Interventions: Treatment focus on initiation of functional task and verbal expression and attention. Pt required Max A verbal and visual cues to demonstrate selective attention in a moderately distracting environment for ~1 minute for redirection.  Pt also required overall Max A verbal, question and visual cues to initiate turn during card game and to initiate verbal expression with other group member.   FIM:  Comprehension Comprehension Mode: Auditory Comprehension: 4-Understands basic 75 - 89% of the time/requires cueing 10 - 24% of the time Expression Expression Mode: Verbal Expression: 1-Expresses basis less than 25% of the time/requires cueing greater than 75% of the time. Social Interaction Social Interaction: 2-Interacts appropriately 25 - 49% of time - Needs frequent redirection. Problem Solving Problem Solving: 2-Solves basic 25 - 49% of the time - needs direction more than half the time to  initiate, plan or complete simple activities Memory Memory: 2-Recognizes or recalls 25 - 49% of the time/requires cueing 51 - 75% of the time FIM - Eating Eating Activity: 5: Supervision/cues;5: Set-up assist for open containers;5: Needs verbal cues/supervision  Pain Pain Assessment Pain Assessment: No/denies pain  Therapy/Group: Individual Therapy  Joda Braatz 09/20/2011, 4:35 PM

## 2011-09-20 NOTE — Progress Notes (Signed)
Subjective/Complaints: Made it through a busy day with therapy yesterdy A 12 point review of systems has been performed and if not noted above is otherwise negative.  Objective: Vital Signs: Blood pressure 140/80, pulse 69, temperature 98 F (36.7 C), temperature source Oral, resp. rate 17, height 5\' 6"  (1.676 m), weight 94.6 kg (208 lb 8.9 oz), SpO2 93.00%. No results found. No results found for this basename: WBC:2,HGB:2,HCT:2,PLT:2 in the last 72 hours  Basename 09/20/11 0500 09/19/11 0600  NA 142 141  K 3.8 3.8  CL 108 107  CO2 25 26  GLUCOSE 170* 134*  BUN 16 13  CREATININE 0.74 0.74  CALCIUM 9.2 9.5   CBG (last 3)   Basename 09/20/11 0745 09/18/11 2144 09/18/11 1654  GLUCAP 138* 226* 136*    Wt Readings from Last 3 Encounters:  09/20/11 94.6 kg (208 lb 8.9 oz)  09/07/11 98.7 kg (217 lb 9.5 oz)  09/07/11 98.7 kg (217 lb 9.5 oz)    Physical Exam:  HENT: Eyes:  Pupils are reactive to light and  Neck: No thyromegaly present.  Cardiovascular: Normal rate and regular rhythm.  Pulmonary/Chest: Breath sounds normal. No respiratory distress. She has no wheezes.  Abdominal: Bowel sounds are normal. She exhibits no distension. There is no tenderness.  Neurological:  Patient with flat affect. More alert today again. A little emotion was shown today. Still Very slow to process--opens yer eyes for mes. answeres with one word usually . She does withdrawal to deep stimulihowever sensory testing limited by her mental status  Upper extremities have 4 minus grip arm flexion extension.  Right lower extremitytrace active movement although once again not clear whether or not she is fully aware of commands she does have right knee pain with range of motion  Left lower extremity has 2 minus hip knee extensor synergy  Right ankle less tender and warm. Right knee improved Skin intact   Assessment/Plan: 1. Functional deficits secondary to SAH/ACA aneurysm with hydrocephalus and VPS  which require 3+ hours per day of interdisciplinary therapy in a comprehensive inpatient rehab setting. Physiatrist is providing close team supervision and 24 hour management of active medical problems listed below. Physiatrist and rehab team continue to assess barriers to discharge/monitor patient progress toward functional and medical goals. FIM: FIM - Bathing Bathing Steps Patient Completed: Chest;Right Arm;Left Arm;Abdomen;Front perineal area;Right upper leg;Left upper leg Bathing: 3: Mod-Patient completes 5-7 65f 10 parts or 50-74%  FIM - Upper Body Dressing/Undressing Upper body dressing/undressing steps patient completed: Thread/unthread right bra strap;Thread/unthread left bra strap;Thread/unthread right sleeve of pullover shirt/dresss;Thread/unthread left sleeve of pullover shirt/dress;Put head through opening of pull over shirt/dress;Pull shirt over trunk Upper body dressing/undressing: 4: Min-Patient completed 75 plus % of tasks FIM - Lower Body Dressing/Undressing Lower body dressing/undressing steps patient completed: Thread/unthread left pants leg;Pull pants up/down;Don/Doff right shoe;Don/Doff left shoe;Fasten/unfasten right shoe;Fasten/unfasten left shoe Lower body dressing/undressing: 4: Min-Patient completed 75 plus % of tasks  FIM - Toileting Toileting: 0: No continent bowel/bladder events this shift  FIM - Archivist Transfers: 0-Activity did not occur  FIM - Banker Devices: Bed rails;Walker Bed/Chair Transfer: 3: Bed > Chair or W/C: Mod A (lift or lower assist);3: Chair or W/C > Bed: Mod A (lift or lower assist)  FIM - Locomotion: Wheelchair Distance: 40 Locomotion: Wheelchair: 1: Travels less than 50 ft with moderate assistance (Pt: 50 - 74%) FIM - Locomotion: Ambulation Locomotion: Ambulation Assistive Devices: Designer, industrial/product Ambulation/Gait Assistance: 3: Mod assist  Locomotion: Ambulation: 2: Travels 50 -  149 ft with moderate assistance (Pt: 50 - 74%)  Comprehension Comprehension Mode: Auditory Comprehension: 3-Understands basic 50 - 74% of the time/requires cueing 25 - 50%  of the time  Expression Expression Mode: Verbal Expression: 1-Expresses basis less than 25% of the time/requires cueing greater than 75% of the time.  Social Interaction Social Interaction: 2-Interacts appropriately 25 - 49% of time - Needs frequent redirection.  Problem Solving Problem Solving: 2-Solves basic 25 - 49% of the time - needs direction more than half the time to initiate, plan or complete simple activities  Memory Memory: 2-Recognizes or recalls 25 - 49% of the time/requires cueing 51 - 75% of the time Medical Problem List and Plan:  1. subarachnoid hemorrhage with small infarct left internal capsule: Status post IVC placement 08/18/2011 with coiling of aneurysm 08/19/2011 and VP shunt placement 09/02/2011  2. DVT Prophylaxis/Anticoagulation: SCDs. Monitor for any signs of DVT  3. Pain Management: Tylenol for now. Monitor with increased mobility. Right knee and right ankle pain we'll check x-rays likely degenerative changes, no recent trauma  4. Neuropsych: This patient is not capable of making decisions on his/her own behalf.  -affect very flat. Trial ritalin initiated    -mood assessment.   5. Dysphagia. Dysphagia 1 nectar thick liquids. Follow per speech therapy. Monitor for any signs of aspiration  6. Seizure prophylaxis. Keppra 500 mg twice a day  7. Hypertension. Hydralazine 20 mg 3 times a day, labetalol 200 mg 3 times a day, name of top 60 mg every 4 hours. Good control at present. 8. Hypernatremia/hypovolemic. ivf stopped. Seems to be maintaining on her own. 9. Hyperglycemia.  Check blood sugars a.c. and at bedtime. hgb A1c elevated slight. Probably borderline diabetic. Added low dose glyburide before i alter diet (dont' want to affect appetite) 10. Right ankle pain: gout  -added colchicine  which seems to have helped. 11.FEN: megace trial.  LOS (Days) 12 A FACE TO FACE EVALUATION WAS PERFORMED  [name] T 09/20/2011, 7:56 AM

## 2011-09-20 NOTE — Progress Notes (Signed)
Occupational Therapy Session Note  Patient Details  Name: [name] MRN: [id number] Date of Birth: [birth year]  Today's Date: 09/20/2011 Time: 0828-0925 Time Calculation (min): 57 min    Skilled Therapeutic Interventions/Progress Updates:    Pt seen for individual OT treatment session with focus on ADL's self care at sink level (pt declined shower level today stating "No" and shaking her head no when asked if she was ready to take a shower, she then said "yes" when asked about bathing at sink level after moderate verbal encouragement for shower level). Focus on task initiation, activity tolerance and functional use R UE as dominant UE. Pt requires Max verbal cues for organization of tasks, quiet environment and extended time w/ verbal cues for follow through with commands noted.  Therapy Documentation Precautions:  Precautions Precautions: Fall Precaution Comments:  (foley catheter, rectal tube removed) Restrictions Weight Bearing Restrictions: No     Pain: Pain Assessment Pain Assessment: 0-10 Pain Score: 0-No pain       See FIM for current functional status  Therapy/Group: Individual Therapy  Alm Bustard 09/20/2011, 11:29 AM

## 2011-09-20 NOTE — Progress Notes (Signed)
Nutrition Follow-up  Intervention:   1. Continue to encourage PO intake of meals as able 2. RD to continue to follow  Assessment:   Intake now more consistently 100%. MBSS on 9/12 recommending upgrade to Dysphagia 2 with thin liquids. Continues on megace.  Diet Order:  Dysphagia 2 with thins  Meds: Scheduled Meds:    . antiseptic oral rinse  15 mL Mouth Rinse q12n4p  . chlorhexidine  15 mL Mouth Rinse BID  . colchicine  0.6 mg Oral BID  . glyBURIDE  2.5 mg Oral Q breakfast  . hydrALAZINE  20 mg Oral Q8H  . hydrocerin   Topical BID  . labetalol  200 mg Oral TID  . levETIRAcetam  500 mg Oral BID  . megestrol  400 mg Oral BID  . methylphenidate  10 mg Oral BID WC  . NiMODipine  60 mg Oral Q4H  . nystatin  5 mL Oral QID  . pantoprazole sodium  40 mg Oral Q1200   Continuous Infusions:  PRN Meds:.acetaminophen, albuterol, food thickener, ondansetron (ZOFRAN) IV, ondansetron, senna-docusate, sodium chloride, sorbitol  Labs:  CMP     Component Value Date/Time   NA 142 09/20/2011 0500   K 3.8 09/20/2011 0500   CL 108 09/20/2011 0500   CO2 25 09/20/2011 0500   GLUCOSE 170* 09/20/2011 0500   BUN 16 09/20/2011 0500   CREATININE 0.74 09/20/2011 0500   CALCIUM 9.2 09/20/2011 0500   PROT 6.8 09/09/2011 0600   ALBUMIN 2.6* 09/09/2011 0600   AST 82* 09/09/2011 0600   ALT 78* 09/09/2011 0600   ALKPHOS 84 09/09/2011 0600   BILITOT 0.4 09/09/2011 0600   GFRNONAA 89* 09/20/2011 0500   GFRAA >90 09/20/2011 0500   CBG (last 3)   Basename 09/20/11 1110 09/20/11 0745 09/18/11 2144  GLUCAP 112* 138* 226*      Intake/Output Summary (Last 24 hours) at 09/20/11 1144 Last data filed at 09/20/11 0500  Gross per 24 hour  Intake    240 ml  Output   1400 ml  Net  -1160 ml  BM 9/16  Weight Status:  94.6 kg - stable  Estimated needs:  1600 - 1800 kcal, 115 - 130 grams protein  Nutrition Dx:  Inadequate oral intake r/t decreased appetite AEB variable meal completion. Resolved.  Goal: Pt to meet >/= 90%  of their estimated nutrition needs; improving  Monitor: weight trends, lab trends, I/O's, PO intake, supplement tolerance  Jarold Motto MS, RD, LDN Pager: [phone] After-hours pager: [phone]

## 2011-09-21 ENCOUNTER — Inpatient Hospital Stay (HOSPITAL_COMMUNITY): Payer: Medicaid Other

## 2011-09-21 ENCOUNTER — Inpatient Hospital Stay (HOSPITAL_COMMUNITY): Payer: Self-pay | Admitting: Speech Pathology

## 2011-09-21 ENCOUNTER — Inpatient Hospital Stay (HOSPITAL_COMMUNITY): Payer: Medicaid Other | Admitting: Physical Therapy

## 2011-09-21 ENCOUNTER — Inpatient Hospital Stay (HOSPITAL_COMMUNITY): Payer: Medicaid Other | Admitting: Occupational Therapy

## 2011-09-21 ENCOUNTER — Other Ambulatory Visit: Payer: Self-pay

## 2011-09-21 DIAGNOSIS — G40909 Epilepsy, unspecified, not intractable, without status epilepticus: Secondary | ICD-10-CM

## 2011-09-21 DIAGNOSIS — Z5189 Encounter for other specified aftercare: Secondary | ICD-10-CM

## 2011-09-21 DIAGNOSIS — I609 Nontraumatic subarachnoid hemorrhage, unspecified: Secondary | ICD-10-CM

## 2011-09-21 DIAGNOSIS — R569 Unspecified convulsions: Secondary | ICD-10-CM

## 2011-09-21 DIAGNOSIS — E870 Hyperosmolality and hypernatremia: Secondary | ICD-10-CM

## 2011-09-21 LAB — BASIC METABOLIC PANEL
CO2: 20 mEq/L (ref 19–32)
Chloride: 104 mEq/L (ref 96–112)
Creatinine, Ser: 0.91 mg/dL (ref 0.50–1.10)
GFR calc Af Amer: 76 mL/min — ABNORMAL LOW (ref >90)
Potassium: 4.2 mEq/L (ref 3.5–5.1)
Sodium: 140 mEq/L (ref 135–145)

## 2011-09-21 LAB — GLUCOSE, CAPILLARY
Glucose-Capillary: 170 mg/dL — ABNORMAL HIGH (ref 70–99)
Glucose-Capillary: 124 mg/dL — ABNORMAL HIGH (ref 70–99)

## 2011-09-21 LAB — CBC
HCT: 33.2 % — ABNORMAL LOW (ref 36.0–46.0)
Hemoglobin: 10.5 g/dL — ABNORMAL LOW (ref 12.0–15.0)
MCV: 84.9 fL (ref 78.0–100.0)
RBC: 3.91 MIL/uL (ref 3.87–5.11)
RDW: 18.1 % — ABNORMAL HIGH (ref 11.5–15.5)
WBC: 8.4 10*3/uL (ref 4.0–10.5)

## 2011-09-21 LAB — CK TOTAL AND CKMB (NOT AT ARMC)
CK, MB: 5.5 ng/mL — ABNORMAL HIGH (ref 0.3–4.0)
Total CK: 85 U/L (ref 7–177)

## 2011-09-21 MED ORDER — LEVETIRACETAM 100 MG/ML PO SOLN
1000.0000 mg | ORAL | Status: DC
  Filled 2011-09-21: qty 10

## 2011-09-21 MED ORDER — SODIUM CHLORIDE 0.9 % IV SOLN
750.0000 mg | Freq: Two times a day (BID) | INTRAVENOUS | Status: DC
  Administered 2011-09-21: 750 mg via INTRAVENOUS
  Filled 2011-09-21: qty 7.5

## 2011-09-21 MED ORDER — LORAZEPAM 2 MG/ML IJ SOLN
INTRAMUSCULAR | Status: AC
  Filled 2011-09-21: qty 2

## 2011-09-21 MED ORDER — SODIUM CHLORIDE 0.9 % IV SOLN
1000.0000 mg | Freq: Two times a day (BID) | INTRAVENOUS | Status: DC
  Filled 2011-09-21: qty 10

## 2011-09-21 MED ORDER — METHYLPHENIDATE HCL 5 MG PO TABS
10.0000 mg | ORAL_TABLET | Freq: Two times a day (BID) | ORAL | Status: DC
  Administered 2011-09-22 – 2011-10-04 (×21): 10 mg via ORAL
  Filled 2011-09-21 (×2): qty 2
  Filled 2011-09-21: qty 1
  Filled 2011-09-21 (×6): qty 2
  Filled 2011-09-21: qty 1
  Filled 2011-09-21: qty 2
  Filled 2011-09-21 (×2): qty 1
  Filled 2011-09-21 (×9): qty 2
  Filled 2011-09-21: qty 1

## 2011-09-21 MED ORDER — SODIUM CHLORIDE 0.9 % IV SOLN
INTRAVENOUS | Status: DC
  Administered 2011-09-22: 11:00:00 via INTRAVENOUS
  Administered 2011-09-22: 100 mL/h via INTRAVENOUS

## 2011-09-21 MED ORDER — LORAZEPAM 2 MG/ML IJ SOLN
INTRAMUSCULAR | Status: AC
  Filled 2011-09-21: qty 1

## 2011-09-21 MED ORDER — SODIUM CHLORIDE 0.9 % IV SOLN
1000.0000 mg | INTRAVENOUS | Status: AC
  Administered 2011-09-21: 1000 mg via INTRAVENOUS
  Filled 2011-09-21: qty 10

## 2011-09-21 MED ORDER — SODIUM CHLORIDE 0.9 % IV SOLN: 1000.0000 mg | Freq: Two times a day (BID) | INTRAVENOUS | Status: DC

## 2011-09-21 NOTE — Progress Notes (Signed)
Physical Therapy Note  Patient Details  Name: [name] MRN: [id number] Date of Birth: 1950/11/25 Today's Date: 09/21/2011  Time: 1300-1315 15 minutes  No c/o pain.  Pt begun in group therapy for gait training.  Pt gait with RW with min A 15', pt then with episode of unresponsiveness, lowered to the floor.  RN, MD made aware.  Pt assisted up to w/c and to bed with lift equipment.  Pt missed 45 minutes skilled PT.  Individual therapy   Briahnna Harries 09/21/2011, 2:53 PM

## 2011-09-21 NOTE — Progress Notes (Signed)
Occupational Therapy Session Note  Patient Details  Name: FLOR BIERNAT MRN: [id number] Date of Birth: 18-Feb-1950  Today's Date: 09/21/2011 Time: 1130-1145 Time Calculation (min): 15 min   Skilled Therapeutic Interventions/Progress Updates: Patient participated in [name] to address ability to self feed in an interactive group setting.  Patient safely able to manage self feeding without cues.  Patient smiling, yet minimally interactive with other members of the group.     Therapy Documentation Precautions:  Precautions Precautions: Fall Precaution Comments:  (foley catheter, rectal tube removed) Restrictions Weight Bearing Restrictions: No   Pain: Pain Assessment Pain Assessment: No/denies pain  See FIM for current functional status  Therapy/Group: Group Therapy  Collier Salina 09/21/2011, 2:11 PM

## 2011-09-21 NOTE — Progress Notes (Signed)
Subjective/Complaints: Slow progress. Delayed processing still prominent A 12 point review of systems has been performed and if not noted above is otherwise negative.  Objective: Vital Signs: Blood pressure 158/81, pulse 60, temperature 98.8 F (37.1 C), temperature source Oral, resp. rate 17, height 5\' 6"  (1.676 m), weight 93.9 kg (207 lb 0.2 oz), SpO2 97.00%. No results found. No results found for this basename: WBC:2,HGB:2,HCT:2,PLT:2 in the last 72 hours  Basename 09/20/11 0500 09/19/11 0600  NA 142 141  K 3.8 3.8  CL 108 107  CO2 25 26  GLUCOSE 170* 134*  BUN 16 13  CREATININE 0.74 0.74  CALCIUM 9.2 9.5   CBG (last 3)   Basename 09/20/11 1623 09/20/11 1110 09/20/11 0745  GLUCAP 99 112* 138*    Wt Readings from Last 3 Encounters:  09/21/11 93.9 kg (207 lb 0.2 oz)  09/07/11 98.7 kg (217 lb 9.5 oz)  09/07/11 98.7 kg (217 lb 9.5 oz)    Physical Exam:  HENT: Eyes:  Pupils are reactive to light and  Neck: No thyromegaly present.  Cardiovascular: Normal rate and regular rhythm.  Pulmonary/Chest: Breath sounds normal. No respiratory distress. She has no wheezes.  Abdominal: Bowel sounds are normal. She exhibits no distension. There is no tenderness.  Neurological:  Patient with flat affect.    Still Very slow to process--opens yer eyes for mes. answeres with one word usually . She does withdrawal to deep stimulihowever sensory testing limited by her mental status  Upper extremities have 4 minus grip arm flexion extension.  Right lower extremitytrace active movement although once again not clear whether or not she is fully aware of commands she does have right knee pain with range of motion  Left lower extremity has 2 minus hip knee extensor synergy  Right ankle less tender and warm. Right knee improved Skin intact   Assessment/Plan: 1. Functional deficits secondary to SAH/ACA aneurysm with hydrocephalus and VPS which require 3+ hours per day of interdisciplinary  therapy in a comprehensive inpatient rehab setting. Physiatrist is providing close team supervision and 24 hour management of active medical problems listed below. Physiatrist and rehab team continue to assess barriers to discharge/monitor patient progress toward functional and medical goals. FIM: FIM - Bathing Bathing Steps Patient Completed: Chest;Right Arm;Left Arm;Abdomen;Front perineal area;Buttocks;Right upper leg;Left upper leg Bathing: 3: Mod-Patient completes 5-7 45f 10 parts or 50-74%  FIM - Upper Body Dressing/Undressing Upper body dressing/undressing steps patient completed: Thread/unthread right bra strap;Thread/unthread left bra strap;Thread/unthread right sleeve of pullover shirt/dresss;Thread/unthread left sleeve of pullover shirt/dress;Put head through opening of pull over shirt/dress;Pull shirt over trunk Upper body dressing/undressing: 4: Min-Patient completed 75 plus % of tasks FIM - Lower Body Dressing/Undressing Lower body dressing/undressing steps patient completed: Thread/unthread right pants leg;Thread/unthread left pants leg;Don/Doff right shoe;Don/Doff left shoe;Fasten/unfasten right shoe;Fasten/unfasten left shoe (Wears TED hose bilateral LE's) Lower body dressing/undressing: 4: Min-Patient completed 75 plus % of tasks  FIM - Toileting Toileting: 0: No continent bowel/bladder events this shift  FIM - Archivist Transfers: 0-Activity did not occur  FIM - Banker Devices: Bed rails;Walker Bed/Chair Transfer: 4: Bed > Chair or W/C: Min A (steadying Pt. > 75%);4: Chair or W/C > Bed: Min A (steadying Pt. > 75%)  FIM - Locomotion: Wheelchair Distance: 40 Locomotion: Wheelchair: 1: Travels less than 50 ft with moderate assistance (Pt: 50 - 74%) FIM - Locomotion: Ambulation Locomotion: Ambulation Assistive Devices: Designer, industrial/product Ambulation/Gait Assistance: 3: Mod assist Locomotion: Ambulation: 2: [employer]  50 -  149 ft with minimal assistance (Pt.>75%)  Comprehension Comprehension Mode: Auditory Comprehension: 4-Understands basic 75 - 89% of the time/requires cueing 10 - 24% of the time  Expression Expression Mode: Verbal Expression: 1-Expresses basis less than 25% of the time/requires cueing greater than 75% of the time.  Social Interaction Social Interaction: 2-Interacts appropriately 25 - 49% of time - Needs frequent redirection.  Problem Solving Problem Solving: 2-Solves basic 25 - 49% of the time - needs direction more than half the time to initiate, plan or complete simple activities  Memory Memory: 2-Recognizes or recalls 25 - 49% of the time/requires cueing 51 - 75% of the time Medical Problem List and Plan:  1. subarachnoid hemorrhage with small infarct left internal capsule: Status post IVC placement 08/18/2011 with coiling of aneurysm 08/19/2011 and VP shunt placement 09/02/2011  2. DVT Prophylaxis/Anticoagulation: SCDs. Monitor for any signs of DVT  3. Pain Management: Tylenol for now. Monitor with increased mobility.   4. Neuropsych: This patient is not capable of making decisions on his/her own behalf.  -affect very flat. Ritalin increased to 15mg   -mood assessment.   5. Dysphagia. Dysphagia 1 nectar thick liquids. Follow per speech therapy. Monitor for any signs of aspiration  6. Seizure prophylaxis. Keppra 500 mg twice a day  7. Hypertension. Hydralazine 20 mg 3 times a day, labetalol 200 mg 3 times a day, name of top 60 mg every 4 hours. Good control at present. 8. Hypernatremia/hypovolemic. ivf stopped. Seems to be maintaining on her own.  -recheck bmet thursday 9. Hyperglycemia.  Check blood sugars a.c. and at bedtime. hgb A1c elevated slight. Probably borderline diabetic. Added low dose glyburide. Sugars a little better but dietary intake can be consistent 10. Right ankle pain: gout  -added colchicine which seems to have helped. 11.FEN: megace trial.  LOS (Days) 13 A  FACE TO FACE EVALUATION WAS PERFORMED  Tyr Franca T 09/21/2011, 6:49 AM

## 2011-09-21 NOTE — Progress Notes (Addendum)
1300;  Called to Day Room, pt on floor ( see PT note), unresponsive to verbal,tactile stimuli; 'puffing' respirations, unable to auscultate B/P; HRR at 81, o2 sat 96% on RA, CBG 124. Marissa Nestle PAC aware, at [facility].  Assisted pt to bed via lift, pt. flat in bed,B/P 92/60 Pt responded briefly, opened eyes on command, moving arms, then immediately presented with held right gaze,frothy oral secretions, 'puff' respirations , unresponsive to sternal rub. 1330 1mg  Ativan given IVP.  AT1340 B/P 72 via dopplers. Pt's legs elevated  ,NS wide open via PICC, rapid response at [facility]. At 1350 B/P 82/42 , HRR at 80 O2 sat 100% on 3 liters/[place].  Keppra IVPB infused as per orders. Dr. Riley Kill at [facility], awakens briefly to sternal rub. 1400 B/P 82/42, IV NS  continues wide open. No further seizure activity observed. EKG done, results to Dr. Riley Kill. 1415 B/P 85/42, to CT accompanied by RR RN. Dr. Riley Kill aware of results.  B/P upon return 102/58, pt. Arouses briefly with physical stim. IVF's to 100/cchr.  1430- B/P 102/58 HHR at 88. 94% on 2l's. -1500- B/P102/72 HHR @ 96, 96% on 2 l's. Family at [facility], states pt woke on own and responding , drowsy, awake briefly, affect at baseline. 1530- critical lab value relayed to [facility], new orders received. PAC made aware pt currently not awake enough to safely take PO meds, eve shift to attempt. 1830- B/P 102/72, resp's even and unlabored, Sat at 96% on 2 l's. asleep but does respond to stimuli. More alert when awake at this time. Report to eve shift. Carlean Purl

## 2011-09-21 NOTE — Progress Notes (Signed)
Patient awake at 8pm able to feed self and watching TV.  Bp 118/83 89. Husband at bedside.

## 2011-09-21 NOTE — Consult Note (Signed)
[facility] CONSULT NOTE     Reason for Consult: Seizure    HPI:   Majority of history obtain from chart as patient is post-ictal   [name] is an 61 y.o. female "African American right-handed female with history of hypertension. Admitted 08/18/2011 with severe onset of headaches and became unresponsive with frothing of the mouth. In the emergency department CT scan imaging of the head showed diffuse subarachnoid hemorrhage as well as small infarct left internal capsule. chest x-ray showing aspiration pneumonia. She was subsequently intubated and later extubated after several hours. Underwent right frontal IVC placement 08/18/2011 per Dr. Venetia Maxon. 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 Underwent coiling of aneurysm 08/19/2011 per Dr. Corliss Skains. Hospital course with VP shunt placed 09/02/2011 due to enlarging ventricles and elevated intracranial pressure. Maintained on Keppra 500 mg BID for seizure prophylaxis. She continues on nimotop for blood pressure control. Followup cranial CT scan due to bouts of lethargy 09/07/2011 showed VP shunt well positioned with no hemorrhage or new brain infarcts. Bouts of hyponatremia 148-154 and monitored.Bouts of hyperglycemia with low-dose insulin therapy initiated and blood sugars normalizing.. Followup speech therapy with modified barium swallow maintained on dysphagia 1 nectar thick liquid diet. Progress has been slow with physical and occupational therapy ongoing and recommendations of physical medicine rehabilitation consult to consider inpatient rehabilitation services. Patient was felt to be a good candidate for inpatient rehabilitation services and was admitted for comprehensive rehabilitation program"  While on Rehab patient had been slow to progress and Keppra was decreased to 250 mg BID with thoughts this may be causing some drowsiness.  Today patient was in the Gym in CIR when patient was  noted to collapse, staring off to the right foaming at the mouth, and none responsive. 1 mg Ativan was given.  After ativan was given seizure ceased however blood pressure dropped to 70 systolic. One gram Keppra was infused and 1 Liter NS was administered to bring BP to 102 systolic.   On exam patient is localizing to pain, moving all extremities, shows no clinical seizure activity.   Past Medical History  Diagnosis Date  . Hypertension     Past Surgical History  Procedure Date  . Abdominal hysterectomy   . Ventriculoperitoneal shunt 09/02/2011    Procedure: SHUNT INSERTION VENTRICULAR-PERITONEAL;  Surgeon: Maeola Harman, MD;  Location: [facility];  Service: Neurosurgery;  Laterality: Right;    No family history on file.  Social History:  reports that she has never smoked. She does not have any smokeless tobacco history on file. She reports that she does not drink alcohol or use illicit drugs.  Allergies  Allergen Reactions  . Lactose Intolerance (Gi) Other (See Comments)    G.I. Upset    Medications:    Prior to Admission:  Prescriptions prior to admission  Medication Sig Dispense Refill  . cloNIDine (CATAPRES) 0.1 MG tablet Take 0.1 mg by mouth 2 (two) times daily.      . hydrochlorothiazide (HYDRODIURIL) 25 MG tablet Take 1 tablet (25 mg total) by mouth daily.  20 tablet  0  . ibuprofen (ADVIL,MOTRIN) 800 MG tablet Take 800 mg by mouth every 8 (eight) hours as needed. Pain      . morphine (ROXANOL) 20 MG/ML concentrated solution Take 0.5-1 mLs (10-20 mg total) by mouth every 2 (two) hours as needed for pain.  50 mL  0   Scheduled:    . antiseptic oral rinse  15 mL Mouth Rinse q12n4p  . chlorhexidine  15 mL Mouth Rinse BID  . colchicine  0.6 mg Oral BID  . glyBURIDE  2.5 mg Oral Q breakfast  . hydrALAZINE  20 mg Oral Q8H  . hydrocerin   Topical BID  . labetalol  200 mg Oral TID  . levetiracetam  1,000 mg Intravenous STAT  . levetiracetam  1,000 mg Intravenous Q12H  .  LORazepam      . megestrol  400 mg Oral BID  . methylphenidate  10 mg Oral BID WC  . NiMODipine  60 mg Oral Q4H  . nystatin  5 mL Oral QID  . pantoprazole sodium  40 mg Oral Q1200  . DISCONTD: levetiracetam  1,000 mg Intravenous Q12H  . DISCONTD: levETIRAcetam  1,000 mg Oral STAT  . DISCONTD: levETIRAcetam  250 mg Oral BID  . DISCONTD: levETIRAcetam  500 mg Oral BID  . DISCONTD: methylphenidate  10 mg Oral BID WC  . DISCONTD: methylphenidate  15 mg Oral BID WC    Review of Systems - General ROS: negative for - chills, fatigue, fever or hot flashes Hematological and Lymphatic ROS: negative for - bruising, fatigue, jaundice or pallor Endocrine ROS: negative for - hair pattern changes, hot flashes, mood swings or skin changes Respiratory ROS: negative for - cough, hemoptysis, orthopnea or wheezing Cardiovascular ROS: negative for - dyspnea on exertion, orthopnea, palpitations or shortness of breath Gastrointestinal ROS: negative for - abdominal pain, appetite loss, blood in stools, diarrhea or hematemesis Musculoskeletal ROS: negative for - joint pain, joint stiffness, joint swelling or muscle pain Neurological ROS: positive for - seizures Dermatological ROS: negative for dry skin, pruritus and rash   Blood pressure 120/70, pulse 60, temperature 98.8 F (37.1 C), temperature source Oral, resp. rate 17, height 5\' 6"  (1.676 m), weight 93.9 kg (207 lb 0.2 oz), SpO2 97.00%.   Neurologic Examination:   Mental Status: Post ictal, opens eyes to voice, withdraws to pain and localizes to pain.    Cranial Nerves: II: Discs flat bilaterally; Visual fields grossly normal, pupils equal, round, reactive to light and accommodation III,IV, VI: ptosis not present, extra-ocular motions intact bilaterally V,VII: smile symmetric, facial light touch sensation normal bilaterally VIII: hearing normal bilaterally IX,X: gag reflex present XI: bilateral shoulder shrug XII: midline tongue  extension Motor: Right : Upper extremity   4/5    Left:     Upper extremity   4/5  Lower extremity   4/5     Lower extremity   4/5 Tone and bulk:normal tone throughout; no atrophy noted Sensory: Withdraws to pain in all extremities Deep Tendon Reflexes: 2+ and symmetric throughout Plantars: Right: mute  Left: mute Cerebellar: Not able to attain CV: pulses palpable throughout     No components found with this basename: cbc,  bmp,  coags,  chol,  tri,  ldl,  hga1c    Results for orders placed during the hospital encounter of 09/08/11 (from the past 48 hour(s))  BASIC METABOLIC PANEL     Status: Abnormal   Collection Time   09/20/11  5:00 AM      Component Value Range Comment   Sodium 142  135 - 145 mEq/L    Potassium 3.8  3.5 - 5.1 mEq/L    Chloride 108  96 - 112 mEq/L    CO2 25  19 - 32 mEq/L    Glucose, Bld 170 (*) 70 -  99 mg/dL    BUN 16  6 - 23 mg/dL    Creatinine, Ser 1.47  0.50 - 1.10 mg/dL    Calcium 9.2  8.4 - 82.9 mg/dL    GFR calc non Af Amer 89 (*) >90 mL/min    GFR calc Af Amer >90  >90 mL/min   GLUCOSE, CAPILLARY     Status: Abnormal   Collection Time   09/20/11  7:45 AM      Component Value Range Comment   Glucose-Capillary 138 (*) 70 - 99 mg/dL    Comment 1 Notify RN     GLUCOSE, CAPILLARY     Status: Abnormal   Collection Time   09/20/11 11:10 AM      Component Value Range Comment   Glucose-Capillary 112 (*) 70 - 99 mg/dL    Comment 1 Notify RN     GLUCOSE, CAPILLARY     Status: Normal   Collection Time   09/20/11  4:23 PM      Component Value Range Comment   Glucose-Capillary 99  70 - 99 mg/dL   GLUCOSE, CAPILLARY     Status: Abnormal   Collection Time   09/21/11  7:36 AM      Component Value Range Comment   Glucose-Capillary 145 (*) 70 - 99 mg/dL    Comment 1 Notify RN     GLUCOSE, CAPILLARY     Status: Abnormal   Collection Time   09/21/11 11:03 AM      Component Value Range Comment   Glucose-Capillary 170 (*) 70 - 99 mg/dL    Comment 1 Notify RN      GLUCOSE, CAPILLARY     Status: Abnormal   Collection Time   09/21/11  1:31 PM      Component Value Range Comment   Glucose-Capillary 124 (*) 70 - 99 mg/dL   BASIC METABOLIC PANEL     Status: Abnormal   Collection Time   09/21/11  1:34 PM      Component Value Range Comment   Sodium 140  135 - 145 mEq/L    Potassium 4.2  3.5 - 5.1 mEq/L    Chloride 104  96 - 112 mEq/L    CO2 20  19 - 32 mEq/L    Glucose, Bld 121 (*) 70 - 99 mg/dL    BUN 18  6 - 23 mg/dL    Creatinine, Ser 5.62  0.50 - 1.10 mg/dL    Calcium 9.5  8.4 - 13.0 mg/dL    GFR calc non Af Amer 66 (*) >90 mL/min    GFR calc Af Amer 76 (*) >90 mL/min   CBC     Status: Abnormal   Collection Time   09/21/11  1:34 PM      Component Value Range Comment   WBC 8.4  4.0 - 10.5 K/uL    RBC 3.91  3.87 - 5.11 MIL/uL    Hemoglobin 10.5 (*) 12.0 - 15.0 g/dL    HCT 86.5 (*) 78.4 - 46.0 %    MCV 84.9  78.0 - 100.0 fL    MCH 26.9  26.0 - 34.0 pg    MCHC 31.6  30.0 - 36.0 g/dL    RDW 69.6 (*) 29.5 - 15.5 %    Platelets 282  150 - 400 K/uL     CT head is negative   Assessment/Plan:   [name] with known seizure disorder presenting with seizure while in CIR.  Recently Keppra was  decreased to 250 mg BID due to concern that  it may be causing some mental slowing.    Recommend: 1) Place patient back on previous Keppra dosing 750 mg BID 2) will follow   Felicie Morn PA-C [facility] [phone]  09/21/2011, 2:58 PM

## 2011-09-21 NOTE — Progress Notes (Signed)
Called to assist with patient with possible seizure activity.  On arrival patient had received 1 mg Ativan IV per order MD.  Dr. Noland Fordyce at bedside.  Patient sleeping.  Some snoring resps noted.  Protecting airway.  Bp 82/42 manual cuff.  Placed on heart monitor - SR 84.  RR 12. O2 sats 95%.  Purposeful to painful stimulation.  Staff reports baseline non-verbal.  NS bolus going per MD order.  Taken to CT scan - tol well.  On return BP 102/62 manual cuff.  Felicie Morn, PA with neurology present.  No more seizure activity noted.  Staff to call if needed.

## 2011-09-21 NOTE — Progress Notes (Signed)
Called to see patient by staff/PA with reported seizure activity while in therapy staring off to the right and foaming at the mouth. Pt apparently staring off to the right and unresponsive initially. 1mg  IV ativan already administered upon my entering the room. The patient had been doing fairly well and was participating in therapy prior to this event. Intake had been much better and we had been backing off IVF. I also had decreased her keppra to 250mg  BID yesterday to see if we could improve ongoing lethargy.   Upon entering the patient's eyes were closed. Pupils were slightly constricted but both were reactive to light. She withdrew to sternal rub and opened her eyes. She also grabbed my left hand after sternal rub. BP was recorded at 72 systolic with doppler.   Pt was placed in trendelenburg. IVF was initiated. Iv keppra ordered in addition to a stat Head CT, lab work, etc. Neurology also was notified and asked to come follow up with this patient. Continue close clinical observation, neuro checks, etc.  My feeling at this point is that this was a syncopal episode although seizure can't be ruled out.

## 2011-09-21 NOTE — Progress Notes (Signed)
Occupational Therapy Session Note  Patient Details  Name: [name] MRN: [id number] Date of Birth: 1951-01-02  Today's Date: 09/21/2011 Time: 0800-0900 Time Calculation (min): 60 min   Skilled Therapeutic Interventions/Progress Updates:    Pt performed bathing and dressing at the sink sit to stand.  Max instructional cueing for initiation of bathing and mod instructional cueing to initiate dressing.  Pt at times would perseverate on wringing out the washcloth and dropping it back into the water.  Overall limited conversation to one to two words during session.  Asked multiple times if she wanted to shower or wash up at the sink but did not initiate an answer.  Min assist for sit to stand from the wheelchair during LB selfcare.    Therapy Documentation Precautions:  Precautions Precautions: Fall Precaution Comments:  (foley catheter, rectal tube removed) Restrictions Weight Bearing Restrictions: No  Pain: Pain Assessment Pain Assessment: No/denies pain ADL: See FIM for current functional status  Therapy/Group: Individual Therapy  Karon Cotterill OTR/L 09/21/2011, 9:12 AM

## 2011-09-21 NOTE — Progress Notes (Signed)
Speech Language Pathology Daily Session Note  Patient Details  Name: INNA TISDELL MRN: [id number] Date of Birth: [birth year]  Today's Date: 09/21/2011 Time: 4098-1191 Time Calculation (min): 20 min  Short Term Goals: Week 2: SLP Short Term Goal 1 (Week 2): Pt will consume dys. 2 textures with thin liquids without overt s/s of aspiration with Mod verbal cues for utilization of swallowing compensatory strategies SLP Short Term Goal 1 - Progress (Week 2): Progressing toward goal SLP Short Term Goal 2 (Week 2): Pt will orient to place, time and situation with Max A contextual, enviornmental and semantic cues.  SLP Short Term Goal 3 (Week 2): Pt will epxress wants/needs at the word level with Max A question and verbal cues.  SLP Short Term Goal 4 (Week 2): Pt will demonstrate sustained attention to a functional task for 2 minutes with Max verbal cues for redirection  SLP Short Term Goal 5 (Week 2): Pt will initiate functional tasks with Max A verbal and tactile cues.   Skilled Therapeutic Interventions: Co-treatment with OT; SLP facilitated session with min assist verbal cues to initiate tasks during self-feeding.  Patient consumed Dys.3 textures with no overt s/s of aspiration and timely mastication.  As a result, it is recommended that this patient initiate a Dys.3 diet. Patient continues to demonstrate limited verbal interaction with others in group.   FIM:  FIM - Eating Eating Activity: 5: Supervision/cues  Pain Pain Assessment Pain Assessment: No/denies pain  Therapy/Group: Group Therapy  Charlane Ferretti., CCC-SLP [phone]  Lenny Bouchillon 09/21/2011, 1:20 PM

## 2011-09-22 ENCOUNTER — Inpatient Hospital Stay (HOSPITAL_COMMUNITY): Payer: Medicaid Other | Admitting: Speech Pathology

## 2011-09-22 ENCOUNTER — Encounter (HOSPITAL_COMMUNITY): Payer: Self-pay | Admitting: Occupational Therapy

## 2011-09-22 ENCOUNTER — Inpatient Hospital Stay (HOSPITAL_COMMUNITY): Payer: Medicaid Other | Admitting: Physical Therapy

## 2011-09-22 ENCOUNTER — Inpatient Hospital Stay (HOSPITAL_COMMUNITY): Payer: Medicaid Other | Admitting: Occupational Therapy

## 2011-09-22 DIAGNOSIS — G40909 Epilepsy, unspecified, not intractable, without status epilepticus: Secondary | ICD-10-CM | POA: Diagnosis present

## 2011-09-22 LAB — GLUCOSE, CAPILLARY
Glucose-Capillary: 180 mg/dL — ABNORMAL HIGH (ref 70–99)
Glucose-Capillary: 102 mg/dL — ABNORMAL HIGH (ref 70–99)
Glucose-Capillary: 160 mg/dL — ABNORMAL HIGH (ref 70–99)

## 2011-09-22 LAB — BASIC METABOLIC PANEL
BUN: 21 mg/dL (ref 6–23)
CO2: 20 mEq/L (ref 19–32)
Calcium: 8.7 mg/dL (ref 8.4–10.5)
Chloride: 109 mEq/L (ref 96–112)
Creatinine, Ser: 0.9 mg/dL (ref 0.50–1.10)
Glucose, Bld: 114 mg/dL — ABNORMAL HIGH (ref 70–99)

## 2011-09-22 LAB — TROPONIN I
Troponin I: 0.95 ng/mL (ref <0.30)
Troponin I: 1.32 ng/mL (ref <0.30)

## 2011-09-22 MED ORDER — SODIUM CHLORIDE 0.9 % IJ SOLN
10.0000 mL | INTRAMUSCULAR | Status: DC | PRN
  Administered 2011-09-22 – 2011-09-23 (×2): 20 mL
  Administered 2011-09-29 (×3): 10 mL
  Administered 2011-10-01: 20 mL

## 2011-09-22 MED ORDER — LEVETIRACETAM 500 MG PO TABS
500.0000 mg | ORAL_TABLET | Freq: Two times a day (BID) | ORAL | Status: DC
  Administered 2011-09-22 – 2011-10-04 (×24): 500 mg via ORAL
  Filled 2011-09-22 (×27): qty 1

## 2011-09-22 NOTE — Progress Notes (Signed)
Occupational Therapy Session Note  Patient Details  Name: [name] MRN: [id number] Date of Birth: Jan 13, 1950  Today's Date: 09/22/2011 Time: 1130-1145 Time Calculation (min): 15 min  Skilled Therapeutic Interventions/Progress Updates:    Patient seen for group session today in [name] to address ability to self feed in group setting. Pt is able to feed herself however she has very few interactions with others, only occasionally nodding her head yes or shaking her head no when attempting to bring into the conversation.  Therapy Documentation Precautions:  Precautions Precautions: Fall Precaution Comments:  (foley catheter, rectal tube removed) Restrictions Weight Bearing Restrictions: No     Pain: Pain Assessment Pain Assessment: No/denies pain    :    See FIM for current functional status  Therapy/Group: Group Therapy  Charletta Cousin, Amy Beth Dixon 09/22/2011, 1:42 PM

## 2011-09-22 NOTE — Progress Notes (Signed)
Occupational Therapy Session Note  Patient Details  Name: KARLEIGH BUNTE MRN: [id number] Date of Birth: [birth year]  Today's Date: 09/22/2011 Time:  0900- 0945    Skilled Therapeutic Interventions/Progress Updates:    self care retraining: session focused on initiation and planning of dressing tasks at EOB and follow through with commands. Pt required extra time and Mod-Max instructional, tactile, and demonstrative cues to complete tasks. Max verbal cues for functional communication; pt spoke ~3 words during session. Sit<>stands for clothing management with Min A and requires at least unilateral UE support to maintain balance, ambulated to w/c from bed with RW Mod A for weight shifts.  Therapy Documentation Precautions:  Precautions Precautions: Fall Precaution Comments:  (foley catheter, rectal tube removed) Restrictions Weight Bearing Restrictions: No Pain:  no c/o pain this AM  See FIM for current functional status  Therapy/Group: Individual Therapy  Jackelyn Poling 09/22/2011, 11:55 AM

## 2011-09-22 NOTE — Progress Notes (Signed)
Physical Therapy Note  Patient Details  Name: LATORRA LORMAN MRN: [id number] Date of Birth: [birth year] Today's Date: 09/22/2011  Time: 1000-1100 60 minutes  No c/o pain.  Pt performed gait short distances with RW with min A, max cuing for attention to task in mod distracting environment, min cuing and increased time for initiation, mod cuing for sequencing stepping with turns and obstacle negotiation. Stair training x 4 stairs with B handrails with min-mod A. Horseshoe game for standing balance/tolerance and following directions.  Pt required mod cuing for attention and following 2 step commands, min A for standing balance without UE support.  Car transfers with husband observing with min A for SPT, assist to lift R LE into car.  Pt with increased lethargy today, requiring increased cues for initiation and attention.  Individual therapy   Tyiana Hill 09/22/2011, 12:19 PM

## 2011-09-22 NOTE — Progress Notes (Signed)
Speech Language Pathology Daily Session Note  Patient Details  Name: [name] MRN: [id number] Date of Birth: Aug 25, 1950  Today's Date: 09/22/2011 Time: 1345-1430 Time Calculation (min): 45 min  Short Term Goals: Week 2: SLP Short Term Goal 1 (Week 2): Pt will consume dys. 2 textures with thin liquids without overt s/s of aspiration with Mod verbal cues for utilization of swallowing compensatory strategies SLP Short Term Goal 1 - Progress (Week 2): Progressing toward goal SLP Short Term Goal 2 (Week 2): Pt will orient to place, time and situation with Max A contextual, enviornmental and semantic cues.  SLP Short Term Goal 3 (Week 2): Pt will epxress wants/needs at the word level with Max A question and verbal cues.  SLP Short Term Goal 4 (Week 2): Pt will demonstrate sustained attention to a functional task for 2 minutes with Max verbal cues for redirection  SLP Short Term Goal 5 (Week 2): Pt will initiate functional tasks with Max A verbal and tactile cues.   Skilled Therapeutic Interventions: Treatment focus on initiation of functional tasks and verbal expression. Pt required total A for verbal expression despite multimodal cueing and multiple attempts.    FIM:  Comprehension Comprehension: 3-Understands basic 50 - 74% of the time/requires cueing 25 - 50%  of the time Expression Expression Mode: Verbal Expression: 1-Expresses basis less than 25% of the time/requires cueing greater than 75% of the time. Social Interaction Social Interaction: 2-Interacts appropriately 25 - 49% of time - Needs frequent redirection. Problem Solving Problem Solving: 2-Solves basic 25 - 49% of the time - needs direction more than half the time to initiate, plan or complete simple activities Memory Memory: 1-Recognizes or recalls less than 25% of the time/requires cueing greater than 75% of the time  Pain Pain Assessment Pain Assessment: No/denies pain  Therapy/Group: Individual  Therapy  Dekota Kirlin 09/22/2011, 5:47 PM

## 2011-09-22 NOTE — Progress Notes (Signed)
Social Work Patient ID: [name], female   DOB: [birth year], 61 y.o.   MRN: [id number]  Met yesterday afternoon with patient, husband and sister to review team conference - explained team recommendation to extend LOS x 1 week, but also the concerns about whether husband will be capable of meeting her anticipated care needs upon d/c.  Husband and sister both agree with these concerns and report they were planning for pt to go to a SNF after CIR.  Confirmed now change in d/c plan to SNF and have explained the process of bed search to them with Medicaid Pending.  Will continue to follow and will alert tx team to change in plan.  Borghild Thaker

## 2011-09-22 NOTE — Patient Care Conference (Signed)
Inpatient RehabilitationTeam Conference Note Date: 09/20/2011   Time: 3:10 AM    Patient Name: [name]      Medical Record Number: [id number]  Date of Birth: [birth year] Sex: Female         Room/Bed: 4030/4030-01 Payor Info: Payor: MEDICAID PENDING  Plan: MEDICAID PENDING  Product Type: *No Product type*     Admitting Diagnosis: SAH   Admit Date/Time:  09/08/2011  4:30 PM Admission Comments: No comment available   Primary Diagnosis:  SAH (subarachnoid hemorrhage) Principal Problem: SAH (subarachnoid hemorrhage)  Patient Active Problem List   Diagnosis Date Noted  . Seizure disorder 09/22/2011  . SAH (subarachnoid hemorrhage) 09/09/2011  . Vasospasm 08/24/2011  . Subarachnoid hemorrhage 08/16/2011  . Encephalopathy 08/16/2011  . Aspiration pneumonia 08/16/2011  . Acute respiratory failure 08/16/2011  . HTN (hypertension) 08/16/2011    Expected Discharge Date: Expected Discharge Date: 10/06/11  Team Members Present: Physician: Dr. Faith Rogue Social Worker Present: Amada Jupiter, LCSW Nurse Present: Chana Bode, RN;Other (comment) Berta Minor, RN) PT Present: Reggy Eye, PT OT Present: Mackie Pai, OT SLP Present: Feliberto Gottron, SLP     Current Status/Progress Goal Weekly Team Focus  Medical   some improvement in initiation. lethargic still at times  see chart  improve bowel continence   Bowel/Bladder             Swallow/Nutrition/ Hydration   Dys. 2 textures and thin liquids, Intermittent supervision  Min A  Goals Met   ADL's   Min A for UB, Mod A for LB, incontinent, Min A transfers and sit<>stands  Min A overall  motor planning and initiation, sustained attention, standing balance, functional mobility, functional communication   Mobility   min-mod A  min A  attention, awareness, motor planning   Communication   Max-Total A  Min A  initiation of verbal expression   Safety/Cognition/ Behavioral Observations  Mod-Max A  Min A  attention,  initiation, problem solving   Pain             Skin                *See Interdisciplinary Assessment and Plan and progress notes for long and short-term goals  Barriers to Discharge: see prior, husband's ability to provide needed care    Possible Resolutions to Barriers:  see prior    Discharge Planning/Teaching Needs:  home  with husband as primary caregiver - concern over his ability to meet care needs      Team Discussion:  Still very poor attention and making slow gains.  Incontinent of b/b.  Max - total assist with cognition.  Need to extend stay.  Revisions to Treatment Plan:  Change in d/c date to 10/3   Continued Need for Acute Rehabilitation Level of Care: The patient requires daily medical management by a physician with specialized training in physical medicine and rehabilitation for the following conditions: Daily direction of a multidisciplinary physical rehabilitation program to ensure safe treatment while eliciting the highest outcome that is of practical value to the patient.: Yes Daily medical management of patient stability for increased activity during participation in an intensive rehabilitation regime.: Yes Daily analysis of laboratory values and/or radiology reports with any subsequent need for medication adjustment of medical intervention for : Post surgical problems;Neurological problems;Other  Cylis Ayars 09/22/2011, 11:22 AM

## 2011-09-22 NOTE — Progress Notes (Signed)
Speech Language Pathology Daily Session Note  Patient Details  Name: [name] MRN: [id number] Date of Birth: August 14, 1950  Today's Date: 09/22/2011 Time: 1478-2956 Time Calculation (min): 25 min  Short Term Goals: Week 2: SLP Short Term Goal 1 (Week 2): Pt will consume dys. 2 textures with thin liquids without overt s/s of aspiration with Mod verbal cues for utilization of swallowing compensatory strategies SLP Short Term Goal 1 - Progress (Week 2): Progressing toward goal SLP Short Term Goal 2 (Week 2): Pt will orient to place, time and situation with Max A contextual, enviornmental and semantic cues.  SLP Short Term Goal 3 (Week 2): Pt will epxress wants/needs at the word level with Max A question and verbal cues.  SLP Short Term Goal 4 (Week 2): Pt will demonstrate sustained attention to a functional task for 2 minutes with Max verbal cues for redirection  SLP Short Term Goal 5 (Week 2): Pt will initiate functional tasks with Max A verbal and tactile cues.   Skilled Therapeutic Interventions: Co-treatment with OT;  SLP facilitated session with min assist verbal cues to initiate tasks during self-feeding and problem solve switching between self feeding tasks.  Patient consumed Dys.2 textures with no overt s/s of aspiration and timely mastication.    FIM:  Comprehension Comprehension: 3-Understands basic 50 - 74% of the time/requires cueing 25 - 50%  of the time Expression Expression Mode: Verbal Expression: 1-Expresses basis less than 25% of the time/requires cueing greater than 75% of the time. Problem Solving Problem Solving: 2-Solves basic 25 - 49% of the time - needs direction more than half the time to initiate, plan or complete simple activities Memory Memory: 1-Recognizes or recalls less than 25% of the time/requires cueing greater than 75% of the time FIM - Eating Eating Activity: 5: Supervision/cues  Pain Pain Assessment Pain Assessment: No/denies  pain  Therapy/Group: Group Therapy  Charlane Ferretti., CCC-SLP [phone]  [name] 09/22/2011, 1:32 PM

## 2011-09-22 NOTE — Progress Notes (Signed)
This note has been reviewed and this clinician agrees with information provided.  

## 2011-09-22 NOTE — Progress Notes (Signed)
Subjective/Complaints: See events of yesterday. Awake this am and relatvely alert. Denies any distress or pain.  A 12 point review of systems has been performed and if not noted above is otherwise negative.  Objective: Vital Signs: Blood pressure 107/75, pulse 79, temperature 98.4 F (36.9 C), temperature source Oral, resp. rate 18, height 5\' 6"  (1.676 m), weight 94.2 kg (207 lb 10.8 oz), SpO2 96.00%. Ct Head Wo Contrast  09/21/2011  *RADIOLOGY REPORT*  Clinical Data: Seizure  CT HEAD WITHOUT CONTRAST  Technique:  Contiguous axial images were obtained from the base of the skull through the vertex without contrast.  Comparison: 09/12/2011  Findings: There is a right frontal ventriculostomy catheter with tip terminating in the region of the third ventricle.  The ventricular volumes are within normal limits.  Subacute infarct involving bilateral frontal lobes, left corpus callosum and left basal ganglia are identified.  The appearance is unchanged from previous examination.  No evidence for acute infarct or intracranial hemorrhage. Aneurysm coil mass is unchanged. The mastoid air cells and the paranasal sinuses are clear.  The skull appears intact.  IMPRESSION:  1.  Stable exam.  No evidence for worsening infarction, new hemorrhage or hydrocephalus.   Original Report Authenticated By: Rosealee Albee, M.D.     Basename 09/21/11 1334  WBC 8.4  HGB 10.5*  HCT 33.2*  PLT 282    Basename 09/21/11 1334 09/20/11 0500  NA 140 142  K 4.2 3.8  CL 104 108  CO2 20 25  GLUCOSE 121* 170*  BUN 18 16  CREATININE 0.91 0.74  CALCIUM 9.5 9.2   CBG (last 3)   Basename 09/21/11 1331 09/21/11 1103 09/21/11 0736  GLUCAP 124* 170* 145*    Wt Readings from Last 3 Encounters:  09/22/11 94.2 kg (207 lb 10.8 oz)  09/07/11 98.7 kg (217 lb 9.5 oz)  09/07/11 98.7 kg (217 lb 9.5 oz)    Physical Exam:  HENT: Eyes:  Pupils are reactive to light and  Neck: No thyromegaly present.  Cardiovascular: Normal  rate and regular rhythm.  Pulmonary/Chest: Breath sounds normal. No respiratory distress. She has no wheezes.  Abdominal: Bowel sounds are normal. She exhibits no distension. There is no tenderness.  Neurological:  Patient with flat affect.    Opens eyes, speaks a few words. Nods appropriately. No-focal findings on exam . Upper extremities have 4 minus grip arm flexion extension.  Right lower extremitytrace active movement although once again not clear whether or not she is fully aware of commands she does have right knee pain with range of motion  Left lower extremity has 2 minus hip knee extensor synergy  Right ankle less tender and warm. Right knee improved Skin intact   Assessment/Plan: 1. Functional deficits secondary to SAH/ACA aneurysm with hydrocephalus and VPS which require 3+ hours per day of interdisciplinary therapy in a comprehensive inpatient rehab setting. Physiatrist is providing close team supervision and 24 hour management of active medical problems listed below. Physiatrist and rehab team continue to assess barriers to discharge/monitor patient progress toward functional and medical goals. FIM: FIM - Bathing Bathing Steps Patient Completed: Chest;Right Arm;Left Arm;Abdomen;Front perineal area;Buttocks;Right upper leg;Left upper leg;Right lower leg (including foot);Left lower leg (including foot) Bathing: 4: Min-Patient completes 8-9 14f 10 parts or 75+ percent  FIM - Upper Body Dressing/Undressing Upper body dressing/undressing steps patient completed: Thread/unthread right bra strap;Thread/unthread left bra strap;Thread/unthread right sleeve of pullover shirt/dresss;Thread/unthread left sleeve of pullover shirt/dress;Put head through opening of pull over shirt/dress;Pull shirt  over trunk Upper body dressing/undressing: 4: Min-Patient completed 75 plus % of tasks FIM - Lower Body Dressing/Undressing Lower body dressing/undressing steps patient completed: Thread/unthread left  pants leg;Pull pants up/down;Fasten/unfasten pants;Don/Doff left sock;Don/Doff right shoe;Fasten/unfasten left shoe Lower body dressing/undressing: 3: Mod-Patient completed 50-74% of tasks  FIM - Toileting Toileting: 0: No continent bowel/bladder events this shift  FIM - Archivist Transfers: 0-Activity did not occur  FIM - Banker Devices: Bed rails;Walker Bed/Chair Transfer: 5: Supine > Sit: Supervision (verbal cues/safety issues);3: Bed > Chair or W/C: Mod A (lift or lower assist)  FIM - Locomotion: Wheelchair Distance: 40 Locomotion: Wheelchair: 1: Travels less than 50 ft with moderate assistance (Pt: 50 - 74%) FIM - Locomotion: Ambulation Locomotion: Ambulation Assistive Devices: Designer, industrial/product Ambulation/Gait Assistance: 3: Mod assist Locomotion: Ambulation: 2: Travels 50 - 149 ft with minimal assistance (Pt.>75%)  Comprehension Comprehension Mode: Auditory Comprehension: 4-Understands basic 75 - 89% of the time/requires cueing 10 - 24% of the time  Expression Expression Mode: Verbal Expression: 1-Expresses basis less than 25% of the time/requires cueing greater than 75% of the time.  Social Interaction Social Interaction: 2-Interacts appropriately 25 - 49% of time - Needs frequent redirection.  Problem Solving Problem Solving: 2-Solves basic 25 - 49% of the time - needs direction more than half the time to initiate, plan or complete simple activities  Memory Memory: 2-Recognizes or recalls 25 - 49% of the time/requires cueing 51 - 75% of the time Medical Problem List and Plan:  1. subarachnoid hemorrhage with small infarct left internal capsule: Status post IVC placement 08/18/2011 with coiling of aneurysm 08/19/2011 and VP shunt placement 09/02/2011  2. DVT Prophylaxis/Anticoagulation: SCDs. Monitor for any signs of DVT  3. Pain Management: Tylenol for now. Monitor with increased mobility.   4. Neuropsych: This  patient is not capable of making decisions on his/her own behalf.  -affect very flat. Ritalin increased to 15mg   -mood assessment.   5. Dysphagia. Dysphagia 1 nectar thick liquids. Follow per speech therapy. Monitor for any signs of aspiration  6. Seizure prophylaxis. Keppra   7. Hypertension. Hydralazine 20 mg 3 times a day, labetalol 200 mg 3 times a day, name of top 60 mg every 4 hours. Good control at present.--back off dosing for now given bp 8. Hypernatremia/hypovolemic. ivf stopped. Seems to be maintaining on her own.  -recheck bmet again today 9. Hyperglycemia.  Check blood sugars a.c. and at bedtime. hgb A1c elevated slight. Probably borderline diabetic. Added low dose glyburide. Sugars a little better but dietary intake can be consistent 10. Right ankle pain: gout  -added colchicine which seems to have helped. 11.FEN: megace trial. 12. Unresponsive episode: may have been a seizure vs syncopal event. CK MB and troponin elevated and pehaps some ekg changes but no signs of distress, no CP, etc. May have been a demand event due to hypotension/seizure yesterday  -enzymes not redrawn as ordered--follow up today  -recheck ekg today  -pt quite comfortable at present  -change keppra back to 500mg  po q12  -continue ivf at present  -activity as tolerated  LOS (Days) 14 A FACE TO FACE EVALUATION WAS PERFORMED  Mervin Ramires T 09/22/2011, 6:55 AM

## 2011-09-22 NOTE — Progress Notes (Signed)
Subjective: No new complaints. No recurrence of seizure activity. Currently on Keppra 500 mg twice a day following a 1000 mg loading dose yesterday.  Objective: Current vital signs: BP 107/75  Pulse 79  Temp 98.4 F (36.9 C) (Oral)  Resp 18  Ht 5\' 6"  (1.676 m)  Wt 94.2 kg (207 lb 10.8 oz)  BMI 33.52 kg/m2  SpO2 96%  Neurologic Exam: Alert and in no acute distress. Follows commands well.   Medications:  Scheduled:   . colchicine  0.6 mg Oral BID  . glyBURIDE  2.5 mg Oral Q breakfast  . hydrocerin   Topical BID  . labetalol  200 mg Oral TID  . levetiracetam  1,000 mg Intravenous STAT  . levETIRAcetam  500 mg Oral BID  . LORazepam      . megestrol  400 mg Oral BID  . methylphenidate  10 mg Oral BID WC  . NiMODipine  60 mg Oral Q4H  . nystatin  5 mL Oral QID  . pantoprazole sodium  40 mg Oral Q1200  . DISCONTD: antiseptic oral rinse  15 mL Mouth Rinse q12n4p  . DISCONTD: chlorhexidine  15 mL Mouth Rinse BID  . DISCONTD: hydrALAZINE  20 mg Oral Q8H  . DISCONTD: levetiracetam  1,000 mg Intravenous Q12H  . DISCONTD: levetiracetam  1,000 mg Intravenous Q12H  . DISCONTD: levETIRAcetam  1,000 mg Oral STAT  . DISCONTD: levetiracetam  750 mg Intravenous Q12H  . DISCONTD: levETIRAcetam  250 mg Oral BID  . DISCONTD: methylphenidate  15 mg Oral BID WC   ZOX:[id number], albuterol, food thickener, ondansetron (ZOFRAN) IV, ondansetron, senna-docusate, sodium chloride, sorbitol  Assessment/Plan: Seizure disorder associated with subarachnoid hemorrhage and stroke, controlled on current dose of Keppra. There is no indication that Keppra is adversely affecting her level of alertness.  No changes are recommended in current management for seizure control. No further neurological intervention is indicated at this point. We will sign off on her care but remain available for reevaluation if needed.  C.R. Roseanne Reno, MD [facility] [phone]  09/22/2011  9:35 AM

## 2011-09-23 ENCOUNTER — Inpatient Hospital Stay (HOSPITAL_COMMUNITY): Payer: Medicaid Other | Admitting: Occupational Therapy

## 2011-09-23 ENCOUNTER — Inpatient Hospital Stay (HOSPITAL_COMMUNITY): Payer: Medicaid Other | Admitting: Physical Therapy

## 2011-09-23 ENCOUNTER — Inpatient Hospital Stay (HOSPITAL_COMMUNITY): Payer: Medicaid Other | Admitting: Speech Pathology

## 2011-09-23 ENCOUNTER — Encounter (HOSPITAL_COMMUNITY): Payer: Self-pay | Admitting: Occupational Therapy

## 2011-09-23 DIAGNOSIS — E119 Type 2 diabetes mellitus without complications: Secondary | ICD-10-CM | POA: Diagnosis not present

## 2011-09-23 DIAGNOSIS — R9431 Abnormal electrocardiogram [ECG] [EKG]: Secondary | ICD-10-CM | POA: Diagnosis not present

## 2011-09-23 DIAGNOSIS — R778 Other specified abnormalities of plasma proteins: Secondary | ICD-10-CM | POA: Diagnosis not present

## 2011-09-23 LAB — GLUCOSE, CAPILLARY: Glucose-Capillary: 116 mg/dL — ABNORMAL HIGH (ref 70–99)

## 2011-09-23 NOTE — Progress Notes (Signed)
Occupational Therapy Session Note  Patient Details  Name: [name] MRN: [id number] Date of Birth: [birth year]  Today's Date: 09/23/2011 Time: 4098-1191 Time Calculation (min): 45 min  Short Term Goals: Week 1:  OT Short Term Goal 1 (Week 1): Pt. will feed self with right hand for 75 % of meal OT Short Term Goal 2 (Week 1): Pt. will bath upper bodly with minimal assist OT Short Term Goal 3 (Week 1): Pt. will sit unsupported for 15 minutes during grooming task OT Short Term Goal 4 (Week 1): Pt. will dress LB with moderate ssist and moderate assist with balance for 1 minute.    Skilled Therapeutic Interventions/Progress Updates:    1:1 self care retraining at shower level. Pt in bed upon arrival. Pt demonstrated functional ambulation bed to shower chair with min A with min A to steer RW with turns. Pt declined to used toilet, however when got into shower pt became incontinent of bowel with decreased awareness. Pt bathed 8/10 parts with constant cuing for thoroughness and to break up perseveration with washing legs.  Pt able to ambulation into room to w/c to complete dressing. Setup pt with right LE crossed to dress first for increased success with threading pants (pt still with decreased right LE weakness and coordination). Brushed teeth with setup and min cuing for problem solve to spit. When addressed verbal expression and her difficulty her verbal response was "I just don't know why Victorino Dike."  Therapy Documentation Precautions:  Precautions Precautions: Fall Precaution Comments:  (foley catheter, rectal tube removed) Restrictions Weight Bearing Restrictions: No Pain:  no c/o pain  See FIM for current functional status  Therapy/Group: Individual Therapy  Roney Mans [facility] 09/23/2011, 10:42 AM

## 2011-09-23 NOTE — Progress Notes (Signed)
This note has been reviewed and this clinician agrees with information provided.  

## 2011-09-23 NOTE — Progress Notes (Signed)
Occupational Therapy Weekly Progress Note  Patient Details  Name: [name] MRN: [id number] Date of Birth: 12-03-1950  Today's Date: 09/23/2011  Patient has met 4 of 4 short term goals.  Pt is currently set-up/supervision for UB and Mod A for LB ADLs, Min A for functional transfers and standing balance, Total multimodal cuing for initiation and sequencing of tasks, Max verbal cuing for sustained attention, and Max verbal and gestural cuing for functional communication. Pt has made improvements with sustained attention to task ~3 min, sit<>stands for functional tasks with Min A and standing balance Min A with unilateral UE support. Pt able to follow through with simple, one-step commands with extra time and cuing. Pt will benefit from continued OT services to increase sustained attention to task, enhance ability to functionally communicate wants/needs, improve initiation and planning of tasks, and improve dynamic standing balance in preparation for functional tasks and mobility.  Patient continues to demonstrate the following deficits: muscle weakness, decreased cardiorespiratoy endurance, decreased initiation, decreased attention, decreased awareness, decreased problem solving, decreased safety awareness, decreased memory and delayed processing and decreased standing balance and therefore will continue to benefit from skilled OT intervention to enhance overall performance with BADL.  Patient progressing toward long term goals..  Continue plan of care.  OT Short Term Goals Week 1:  OT Short Term Goal 1 (Week 1): Pt. will feed self with right hand for 75 % of meal OT Short Term Goal 1 - Progress (Week 1): Met OT Short Term Goal 2 (Week 1): Pt. will bath upper bodly with minimal assist OT Short Term Goal 2 - Progress (Week 1): Met OT Short Term Goal 3 (Week 1): Pt. will sit unsupported for 15 minutes during grooming task OT Short Term Goal 3 - Progress (Week 1): Met OT Short Term Goal 4 (Week  1): Pt. will dress LB with moderate ssist and moderate assist with balance for 1 minute.   OT Short Term Goal 4 - Progress (Week 1): Met Week 2:  OT Short Term Goal 1 (Week 2): Pt will dress LB with Min A to cross R LE over and hold in place. OT Short Term Goal 2 (Week 2): Pt will maintain standing balance without UE support with steady A in preparation for ADLs. OT Short Term Goal 3 (Week 2): Pt will participate in functional communication with Max cuing to verbalize needs/wants. OT Short Term Goal 4 (Week 2): Pt will perform bathing tasks with Mod verbal and tactile cues for initiation and sequencing. OT Short Term Goal 5 (Week 2): Pt will demonstrate sustained attention to a functional task for 5-7 min with Min verbal cues.    Therapy Documentation Precautions:  Precautions Precautions: Fall Precaution Comments:  (foley catheter, rectal tube removed) Restrictions Weight Bearing Restrictions: No Pain:  no c/o pain this date  See FIM for current functional status  Therapy/Group: Individual Therapy  Jackelyn Poling 09/23/2011, 3:37 PM

## 2011-09-23 NOTE — Consult Note (Signed)
Reason for Consult: Elevated Troponin after a seizure  Requesting Physician: Dr Riley Kill  HPI: This is a 61 y.o. female with a past medical history significant for HTN who was followed at [facility] clinic, (no PMD).She admitted to [facility] 08/16/11  with subarachnoid hemorrhage with hydrocephalus. She had reportedly experienced severe headaches 08-16-11 followed by loss of consciousness. Supportive care had been recommended as it appeared her chance of recovery was low.. She was transferred 08/18/11 for intervention when her status began to improve after she tranfered to a palliative type unit and extubated. 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 IVC was placed by Dr. Venetia Maxon on 08/18/11.The  ACAA coiled by Dr. Corliss Skains on 08/19/11. The IVC was inadvertently pulled during CT scan on 08/28/11. She remained stable, though by 09/01/11 CT demonstrated enlarging ventricles requiring ventriculoperitoneal shunt. VP shunt was placed by Dr. Venetia Maxon & Dr. Abbey Chatters on 09/02/11. She progressed slowly thereafter, transferring to [facility] from the ICU and then on to in patient rehab at [facility].While on Rehab the patient had been slow to progress and Keppra was decreased to 250 mg BID with the thought that this may be causing some drowsiness. On 09/21/11 the patient was in the Gym in CIR when she collapsed, staring off to the right foaming at the mouth, and not responsive. Labs done then showed an elevated Troponin of 1.32. Subsequent EKGs show declining Troponin. Her EKG shows Qs 3 and AVF and TWI in V2, this is new from her admission EKG. I spoke to her husband, there is no history of any cardiac work up or prior history of chest pain.    PMHx:  Past Medical History  Diagnosis Date  . Hypertension    Past Surgical History  Procedure Date  . Abdominal hysterectomy   . Ventriculoperitoneal shunt 09/02/2011    Procedure: SHUNT INSERTION VENTRICULAR-PERITONEAL;  Surgeon: Maeola Harman, MD;  Location: [facility];  Service: Neurosurgery;  Laterality: Right;    FAMHx: No family history on file.  SOCHx:  reports that she has never smoked. She does not have any smokeless tobacco history on file. She reports that she does not drink alcohol or use illicit drugs.  ALLERGIES: Allergies  Allergen Reactions  . Lactose Intolerance (Gi) Other (See Comments)    G.I. Upset    ROS: Review of systems not obtained due to patient factors.  HOME MEDICATIONS: Prescriptions prior to admission  Medication Sig Dispense Refill  . cloNIDine (CATAPRES) 0.1 MG tablet Take 0.1 mg by mouth 2 (two) times daily.      . hydrochlorothiazide (HYDRODIURIL) 25 MG tablet Take 1 tablet (25 mg total) by mouth daily.  20 tablet  0  . ibuprofen (ADVIL,MOTRIN) 800 MG tablet Take 800 mg by mouth every 8 (eight) hours as needed. Pain      . morphine (ROXANOL) 20 MG/ML concentrated solution Take 0.5-1 mLs (10-20 mg total) by mouth every 2 (two) hours as needed for pain.  50 mL  0    HOSPITAL MEDICATIONS: I have reviewed the patient's current medications.  VITALS: Blood pressure 126/79, pulse 67, temperature 98.5 F (36.9 C), temperature source Oral, resp. rate 19, height 5\' 6"  (1.676 m), weight 94.2 kg (207 lb 10.8 oz), SpO2 96.00%.  PHYSICAL EXAM: pending, patient currently unavailable for exam. Sedated No JVD No rales No chest wall tenderness RRR  Abd soft, BS+ No edema  LABS: Results for orders placed during the hospital encounter of 09/08/11 (from the past 48 hour(s))  GLUCOSE, CAPILLARY     Status: Abnormal   Collection Time   09/21/11  1:31 PM      Component Value Range Comment   Glucose-Capillary 124 (*) 70 - 99 mg/dL   BASIC METABOLIC PANEL     Status: Abnormal   Collection Time   09/21/11  1:34 PM      Component Value Range Comment   Sodium 140  135 - 145 mEq/L    Potassium 4.2  3.5 - 5.1 mEq/L    Chloride 104  96 - 112 mEq/L    CO2 20  19 - 32 mEq/L    Glucose, Bld 121 (*) 70 - 99 mg/dL    BUN 18   6 - 23 mg/dL    Creatinine, Ser 9.14  0.50 - 1.10 mg/dL    Calcium 9.5  8.4 - 78.2 mg/dL    GFR calc non Af Amer 66 (*) >90 mL/min    GFR calc Af Amer 76 (*) >90 mL/min   CBC     Status: Abnormal   Collection Time   09/21/11  1:34 PM      Component Value Range Comment   WBC 8.4  4.0 - 10.5 K/uL    RBC 3.91  3.87 - 5.11 MIL/uL    Hemoglobin 10.5 (*) 12.0 - 15.0 g/dL    HCT 95.6 (*) 21.3 - 46.0 %    MCV 84.9  78.0 - 100.0 fL    MCH 26.9  26.0 - 34.0 pg    MCHC 31.6  30.0 - 36.0 g/dL    RDW 08.6 (*) 57.8 - 15.5 %    Platelets 282  150 - 400 K/uL   CK TOTAL AND CKMB     Status: Abnormal   Collection Time   09/21/11  3:19 PM      Component Value Range Comment   Total CK 85  7 - 177 U/L    CK, MB 5.5 (*) 0.3 - 4.0 ng/mL    Relative Index RELATIVE INDEX IS INVALID  0.0 - 2.5   TROPONIN I     Status: Abnormal   Collection Time   09/21/11  3:19 PM      Component Value Range Comment   Troponin I 0.57 (*) <0.30 ng/mL   GLUCOSE, CAPILLARY     Status: Abnormal   Collection Time   09/22/11  7:38 AM      Component Value Range Comment   Glucose-Capillary 112 (*) 70 - 99 mg/dL    Comment 1 Notify RN     BASIC METABOLIC PANEL     Status: Abnormal   Collection Time   09/22/11  8:10 AM      Component Value Range Comment   Sodium 139  135 - 145 mEq/L    Potassium 3.8  3.5 - 5.1 mEq/L    Chloride 109  96 - 112 mEq/L    CO2 20  19 - 32 mEq/L    Glucose, Bld 114 (*) 70 - 99 mg/dL    BUN 21  6 - 23 mg/dL    Creatinine, Ser 4.69  0.50 - 1.10 mg/dL    Calcium 8.7  8.4 - 62.9 mg/dL    GFR calc non Af Amer 67 (*) >90 mL/min    GFR calc Af Amer 77 (*) >90 mL/min   TROPONIN I     Status: Abnormal   Collection Time   09/22/11  8:10 AM      Component Value Range Comment  Troponin I 0.95 (*) <0.30 ng/mL   GLUCOSE, CAPILLARY     Status: Abnormal   Collection Time   09/22/11 11:24 AM      Component Value Range Comment   Glucose-Capillary 180 (*) 70 - 99 mg/dL    Comment 1 Notify RN     GLUCOSE,  CAPILLARY     Status: Abnormal   Collection Time   09/22/11  4:48 PM      Component Value Range Comment   Glucose-Capillary 102 (*) 70 - 99 mg/dL   GLUCOSE, CAPILLARY     Status: Abnormal   Collection Time   09/22/11  9:19 PM      Component Value Range Comment   Glucose-Capillary 160 (*) 70 - 99 mg/dL   GLUCOSE, CAPILLARY     Status: Abnormal   Collection Time   09/23/11  7:35 AM      Component Value Range Comment   Glucose-Capillary 112 (*) 70 - 99 mg/dL    Comment 1 Notify RN     GLUCOSE, CAPILLARY     Status: Abnormal   Collection Time   09/23/11 11:10 AM      Component Value Range Comment   Glucose-Capillary 116 (*) 70 - 99 mg/dL    Comment 1 Notify RN      EKG, NSR with Qs 3/ AVF, TWI V2    IMPRESSION: Principal Problem:  *SAH (subarachnoid hemorrhage) 08/16/11 Active Problems:  Seizure disorder, seizure 09/21/11 while in rehab  HTN (hypertension)  Elevated troponin after siezure  Aspiration pneumonia on admission, transient intubation  Abnormal EKG   RECOMMENDATION: MD to see, she will probably need an echo.  Time Spent Directly with Patient: 45 minutes  KILROY,LUKE K 09/23/2011, 12:06 PM     Patient seen and examined. Agree with assessment and plan. History reviewed. Pt and family deny any known cardiac history. No prior chest pain or significant exertional dyspnea.  ECG shows inferior Q waves suggestive of old IMI, and new anterior T changes worrisome for ischemia, although T abnormalities can be observed with cerebral bleed.Troponin is mildly positive. Will check echo to assess lv function and wall motion. Pt is on beta blockade with normadyne. Will follow to determine additional cardiac w/u.  Lennette Bihari, MD, [facility] 09/23/2011 3:19 PM

## 2011-09-23 NOTE — Progress Notes (Signed)
Physical Therapy Weekly Progress Note  Patient Details  Name: [name] MRN: [id number] Date of Birth: 27-Feb-1950  Today's Date: 09/23/2011  Patient has met 2 of 3 short term goals.  Pt has improved mobility and is now min-mod A for gait and transfers, limited by decreased attention which limits distance with gait and w/c propulsion.  Patient continues to demonstrate the following deficits: decreased activity tolerance and strength, impaired gait and balance, impaired attention and awareness and therefore will continue to benefit from skilled PT intervention to enhance [name] performance with activity tolerance, balance, postural control, ability to compensate for deficits, attention, awareness and coordination.  Patient progressing toward long term goals..  Continue plan of care.  PT Short Term Goals Week 2:  PT Short Term Goal 1 (Week 2): Pt will propel w/c 50' in controlled environment with supervision PT Short Term Goal 1 - Progress (Week 2): Progressing toward goal PT Short Term Goal 2 (Week 2): Pt will gait with min A 25' in controlled environment PT Short Term Goal 2 - Progress (Week 2): Met PT Short Term Goal 3 (Week 2): Pt will demo sustained attention x 2 minutes to functional task with min A PT Short Term Goal 3 - Progress (Week 2): Met Week 3:  PT Short Term Goal 1 (Week 3): Pt will perform funcitonal transfers consistently with min A PT Short Term Goal 2 (Week 3): Pt will gait 25' in home environment with min A PT Short Term Goal 3 (Week 3): Pt will propel w/c in controlled environment 47' with supervision  Skilled Therapeutic Interventions/Progress Updates:  Ambulation/gait training;Community reintegration;DME/adaptive equipment instruction;Neuromuscular re-education;Therapeutic Exercise;UE/LE Strength taining/ROM;Wheelchair Designer, jewellery;Therapeutic Activities;Patient/family education;Functional mobility  training;Cognitive remediation/compensation;Balance/vestibular training;Discharge planning;Pain management;Splinting/orthotics   See FIM for current functional status  [name] 09/23/2011, 2:56 PM

## 2011-09-23 NOTE — Progress Notes (Signed)
Subjective/Complaints: Pt awake in bed and alert. Speaking with good volume. Appropriate with conversation. Denies chest pain, sob, cough, etc.   A 12 point review of systems has been performed and if not noted above is otherwise negative.  Objective: Vital Signs: Blood pressure 126/79, pulse 67, temperature 98.5 F (36.9 C), temperature source Oral, resp. rate 19, height 5\' 6"  (1.676 m), weight 94.2 kg (207 lb 10.8 oz), SpO2 96.00%. Ct Head Wo Contrast  09/21/2011  *RADIOLOGY REPORT*  Clinical Data: Seizure  CT HEAD WITHOUT CONTRAST  Technique:  Contiguous axial images were obtained from the base of the skull through the vertex without contrast.  Comparison: 09/12/2011  Findings: There is a right frontal ventriculostomy catheter with tip terminating in the region of the third ventricle.  The ventricular volumes are within normal limits.  Subacute infarct involving bilateral frontal lobes, left corpus callosum and left basal ganglia are identified.  The appearance is unchanged from previous examination.  No evidence for acute infarct or intracranial hemorrhage. Aneurysm coil mass is unchanged. The mastoid air cells and the paranasal sinuses are clear.  The skull appears intact.  IMPRESSION:  1.  Stable exam.  No evidence for worsening infarction, new hemorrhage or hydrocephalus.   Original Report Authenticated By: Rosealee Albee, M.D.     Basename 09/21/11 1334  WBC 8.4  HGB 10.5*  HCT 33.2*  PLT 282    Basename 09/22/11 0810 09/21/11 1334  NA 139 140  K 3.8 4.2  CL 109 104  CO2 20 20  GLUCOSE 114* 121*  BUN 21 18  CREATININE 0.90 0.91  CALCIUM 8.7 9.5   CBG (last 3)   Basename 09/22/11 2119 09/22/11 1648 09/22/11 1124  GLUCAP 160* 102* 180*    Wt Readings from Last 3 Encounters:  09/22/11 94.2 kg (207 lb 10.8 oz)  09/07/11 98.7 kg (217 lb 9.5 oz)  09/07/11 98.7 kg (217 lb 9.5 oz)    Physical Exam:  HENT: Eyes:  Pupils are reactive to light and  Neck: No thyromegaly  present.  Cardiovascular: Normal rate and regular rhythm.  Pulmonary/Chest: Breath sounds normal. No respiratory distress. She has no wheezes.  Abdominal: Bowel sounds are normal. She exhibits no distension. There is no tenderness.  Neurological:  Patient with flat affect.    Opens eyes, speaks a few words. Nods appropriately. No-focal findings on exam . Upper extremities have 4 minus grip arm flexion extension.  Right lower extremitytrace active movement although once again not clear whether or not she is fully aware of commands she does have right knee pain with range of motion  Left lower extremity has 2 minus hip knee extensor synergy  Right ankle less tender and warm. Right knee improved Skin intact   Assessment/Plan: 1. Functional deficits secondary to SAH/ACA aneurysm with hydrocephalus and VPS which require 3+ hours per day of interdisciplinary therapy in a comprehensive inpatient rehab setting. Physiatrist is providing close team supervision and 24 hour management of active medical problems listed below. Physiatrist and rehab team continue to assess barriers to discharge/monitor patient progress toward functional and medical goals. FIM: FIM - Bathing Bathing Steps Patient Completed: Chest;Right Arm;Left Arm;Abdomen;Front perineal area;Buttocks;Right upper leg;Left upper leg;Right lower leg (including foot);Left lower leg (including foot) Bathing: 4: Min-Patient completes 8-9 44f 10 parts or 75+ percent  FIM - Upper Body Dressing/Undressing Upper body dressing/undressing steps patient completed: Thread/unthread right sleeve of pullover shirt/dresss;Thread/unthread left sleeve of pullover shirt/dress;Put head through opening of pull over shirt/dress;Pull shirt over  trunk Upper body dressing/undressing: 5: Set-up assist to: Obtain clothing/put away FIM - Lower Body Dressing/Undressing Lower body dressing/undressing steps patient completed: Thread/unthread left pants leg;Pull pants  up/down;Fasten/unfasten left shoe;Don/Doff left shoe Lower body dressing/undressing: 3: Mod-Patient completed 50-74% of tasks  FIM - Toileting Toileting: 0: No continent bowel/bladder events this shift  FIM - Archivist Transfers: 0-Activity did not occur  FIM - Banker Devices: Bed rails;Walker Bed/Chair Transfer: 4: Supine > Sit: Min A (steadying Pt. > 75%/lift 1 leg);4: Bed > Chair or W/C: Min A (steadying Pt. > 75%)  FIM - Locomotion: Wheelchair Distance: 40 Locomotion: Wheelchair: 1: Travels less than 50 ft with moderate assistance (Pt: 50 - 74%) FIM - Locomotion: Ambulation Locomotion: Ambulation Assistive Devices: Designer, industrial/product Ambulation/Gait Assistance: 3: Mod assist Locomotion: Ambulation: 1: Travels less than 50 ft with minimal assistance (Pt.>75%)  Comprehension Comprehension Mode: Auditory Comprehension: 3-Understands basic 50 - 74% of the time/requires cueing 25 - 50%  of the time  Expression Expression Mode: Verbal Expression: 1-Expresses basis less than 25% of the time/requires cueing greater than 75% of the time.  Social Interaction Social Interaction: 2-Interacts appropriately 25 - 49% of time - Needs frequent redirection.  Problem Solving Problem Solving: 2-Solves basic 25 - 49% of the time - needs direction more than half the time to initiate, plan or complete simple activities  Memory Memory: 1-Recognizes or recalls less than 25% of the time/requires cueing greater than 75% of the time Medical Problem List and Plan:  1. subarachnoid hemorrhage with small infarct left internal capsule: Status post IVC placement 08/18/2011 with coiling of aneurysm 08/19/2011 and VP shunt placement 09/02/2011  2. DVT Prophylaxis/Anticoagulation: SCDs. Monitor for any signs of DVT  3. Pain Management: Tylenol for now. Monitor with increased mobility.   4. Neuropsych: This patient is not capable of making decisions on  his/her own behalf.  -affect very flat. Ritalin 10mg   -mood assessment.   5. Dysphagia. Dysphagia 1 nectar thick liquids. Follow per speech therapy. Monitor for any signs of aspiration  6. Seizure prophylaxis. Keppra   7. Hypertension. Hydralazine 20 mg 3 times a day, labetalol 200 mg 3 times a day, name of top 60 mg every 4 hours. Good control at present.--back off dosing for now given bp 8. Hypernatremia/hypovolemic. ivf stopped. Seems to be maintaining on her own. 9. Hyperglycemia.  Check blood sugars a.c. and at bedtime. hgb A1c elevated slight. Probably borderline diabetic. Added low dose glyburide. Sugars a little better but dietary intake can be consistent 10. Right ankle pain: gout  -added colchicine which seems to have helped. 11.FEN: megace trial. 12.Seizure  -enzymes not redrawn as ordered--follow up today  -recheck ekg stable  -troponin was elevated but this can take place with seizures, and in absence of any cardiac symptomatology this appears to be the case. Will discuss with cardiology this am.  -keppra 500mg  bid  -dc ivf  -activity as tolerated  LOS (Days) 15 A FACE TO FACE EVALUATION WAS PERFORMED  SWARTZ,ZACHARY T 09/23/2011, 6:57 AM

## 2011-09-23 NOTE — Progress Notes (Signed)
Speech Language Pathology Session & Weekly Progress Notes  Patient Details  Name: [name] MRN: [id number] Date of Birth: 1950/10/16  Today's Date: 09/23/2011 Time: 1345-1430 Time Calculation (min): 45 min  Skilled Therapeutic Intervention: Treatment focus on verbal initiation with functional and familiar tasks. Pt required total A for verbal and written expression for ingredients needed to make a pound cake. Pt also participated in phone conversation with clinician and required total A question cues to engage in conversation and Max A for sustained attention to task. Pt's daughter present throughout session and educated on pt's current function and goals.    Short Term Goals: Week 2: SLP Short Term Goal 1 (Week 2): Pt will consume dys. 2 textures with thin liquids without overt s/s of aspiration with Mod verbal cues for utilization of swallowing compensatory strategies SLP Short Term Goal 1 - Progress (Week 2): Met SLP Short Term Goal 2 (Week 2): Pt will orient to place, time and situation with Max A contextual, enviornmental and semantic cues.  SLP Short Term Goal 2 - Progress (Week 2): Not met SLP Short Term Goal 3 (Week 2): Pt will epxress wants/needs at the word level with Max A question and verbal cues.  SLP Short Term Goal 3 - Progress (Week 2): Not met SLP Short Term Goal 4 (Week 2): Pt will demonstrate sustained attention to a functional task for 2 minutes with Max verbal cues for redirection  SLP Short Term Goal 4 - Progress (Week 2): Met SLP Short Term Goal 5 (Week 2): Pt will initiate functional tasks with Max A verbal and tactile cues.  SLP Short Term Goal 5 - Progress (Week 2): Met  New Short Term Goals:  Week 3: SLP Short Term Goal 1 (Week 3): Pt will initiate functional tasks with Min verbal cues.  SLP Short Term Goal 2 (Week 3): Pt will choose between a field of two with Mod A verbal and question cues.  SLP Short Term Goal 3 (Week 3): Pt will orient to place, time  and situation with Max A multimodal cueing SLP Short Term Goal 4 (Week 3): Pt will demonstrate sustained attention to functional task for 5 minutes with Max A verbal cues for redirection SLP Short Term Goal 5 (Week 3): Pt will consume Dys. 3 textures and thin liquids without overt s/s of aspiration and utilize swallowing compensatory strategies with Mod I.   Weekly Progress Updates: Pt has made functional gains and has met 3 out of 5 STG's this reporting period. Currently, pt is total A with multimodal cuing for verbal initiation, working memory, orientation, awareness and working memory and Max verbal cuing for sustained attention, and Mod A for initiation of functional tasks. Pt able to follow through with simple, one-step commands with extra time and verbal and gestural cues. Pt will benefit from continued SLP services to increase cognitive function and overall independence.    SLP Frequency: 1-2 X/day, 30-60 minutes Estimated Length of Stay: 2 weeks SLP Treatment/Interventions: Cognitive remediation/compensation;Dysphagia/aspiration precaution training;Internal/external aids;Therapeutic Activities;Cueing hierarchy;Environmental controls;Functional tasks;Patient/family education  Daily Session FIM:  Comprehension Comprehension Mode: Auditory Comprehension: 3-Understands basic 50 - 74% of the time/requires cueing 25 - 50%  of the time Expression Expression Mode: Verbal Expression: 1-Expresses basis less than 25% of the time/requires cueing greater than 75% of the time. Social Interaction Social Interaction: 1-Interacts appropriately less than 25% of the time. May be withdrawn or combative. Problem Solving Problem Solving: 1-Solves basic less than 25% of the time - needs direction  nearly all the time or does not effectively solve problems and may need a restraint for safety Memory Memory: 1-Recognizes or recalls less than 25% of the time/requires cueing greater than 75% of the  time Pain Pain Assessment Pain Assessment: No/denies pain  Therapy/Group: Individual Therapy  Abdul Beirne 09/23/2011, 4:34 PM

## 2011-09-23 NOTE — Progress Notes (Signed)
Physical Therapy Note  Patient Details  Name: [name] MRN: [id number] Date of Birth: [birth year] Today's Date: 09/23/2011  Time In:  11;30  Time Out:  12:20.  No complaints of pain.  Patient attended [employer] with treatment focus on increasing attention, increasing verbal out put, small bites, safe swallowing, increasing patient insight.  Patient did extremely well and Speech Therapy will upgrade patient's diet.  Patient's daughter present for group.  Patient required min vc's for swallowing strategies, mod vc's for verbal output.   Norton Pastel 09/23/2011, 3:13 PM

## 2011-09-23 NOTE — Progress Notes (Signed)
Physical Therapy Note  Patient Details  Name: [name] MRN: [id number] Date of Birth: Aug 20, 1950 Today's Date: 09/23/2011  Time: [id number] 60 minutes  No c/o pain.  Standing cognitive task sorting coupons to make a meal.  Pt initially min A to choose items from coupons and name prices.  Pt mod-max A when more fatigued with increased visual stimulation of busy newspaper coupon section.  Required max cuing and reduced choices (2-3 options) to complete task.  Gait training in controlled environment 75' x 2, pt R knee hyperextends when fatigued, pt with decreased awareness/decreased initiation to ask for rest, requires cuing from PT to initiate rest break.  Nu step for LE strength and endurance x 5 minutes with min cuing for attention to task.  Pt initially with min cuing for sustained attention, required mod-max cuing with increased fatigue.  Individual therapy   Ellyanna Holton 09/23/2011, 10:41 AM

## 2011-09-24 ENCOUNTER — Inpatient Hospital Stay (HOSPITAL_COMMUNITY): Payer: Medicaid Other | Admitting: Speech Pathology

## 2011-09-24 DIAGNOSIS — I609 Nontraumatic subarachnoid hemorrhage, unspecified: Secondary | ICD-10-CM

## 2011-09-24 DIAGNOSIS — E870 Hyperosmolality and hypernatremia: Secondary | ICD-10-CM

## 2011-09-24 DIAGNOSIS — Z5189 Encounter for other specified aftercare: Secondary | ICD-10-CM

## 2011-09-24 LAB — GLUCOSE, CAPILLARY
Glucose-Capillary: 92 mg/dL (ref 70–99)
Glucose-Capillary: 77 mg/dL (ref 70–99)

## 2011-09-24 NOTE — Progress Notes (Signed)
  Echocardiogram 2D Echocardiogram has been performed.  Alvin Rubano FRANCES 09/24/2011, 4:05 PM

## 2011-09-24 NOTE — Progress Notes (Signed)
Patient ID: [name], female   DOB: May 29, 1950, 61 y.o.   MRN: [id number] Subjective/Complaints: Asleep but arousable, no breakfast thus far  A 12 point review of systems has been performed and if not noted above is otherwise negative.  Objective: Vital Signs: Blood pressure 111/70, pulse 69, temperature 98.6 F (37 C), temperature source Oral, resp. rate 17, height 5\' 6"  (1.676 m), weight 100.5 kg (221 lb 9 oz), SpO2 95.00%. No results found.  Basename 09/21/11 1334  WBC 8.4  HGB 10.5*  HCT 33.2*  PLT 282    Basename 09/22/11 0810 09/21/11 1334  NA 139 140  K 3.8 4.2  CL 109 104  CO2 20 20  GLUCOSE 114* 121*  BUN 21 18  CREATININE 0.90 0.91  CALCIUM 8.7 9.5   CBG (last 3)   Basename 09/24/11 0727 09/23/11 2110 09/23/11 1608  GLUCAP 92 107* 78    Wt Readings from Last 3 Encounters:  09/24/11 100.5 kg (221 lb 9 oz)  09/07/11 98.7 kg (217 lb 9.5 oz)  09/07/11 98.7 kg (217 lb 9.5 oz)    Physical Exam:  HENT: Eyes:  Pupils are reactive to light and  Neck: No thyromegaly present.  Cardiovascular: Normal rate and regular rhythm.  Pulmonary/Chest: Breath sounds normal. No respiratory distress. She has no wheezes.  Abdominal: Bowel sounds are normal. She exhibits no distension. There is no tenderness.  Neurological:  Patient with flat affect.    Opens eyes, speaks a few words. Nods appropriately. No-focal findings on exam . Upper extremities have 4 minus grip arm flexion extension.  Right lower extremitytrace active movement although once again not clear whether or not she is fully aware of commands she does have right knee pain with range of motion  Left lower extremity has 3 minus hip knee extensor synergy  Right ankle less tender and warm. Right knee improved Skin intact   Assessment/Plan: 1. Functional deficits secondary to SAH/ACA aneurysm with hydrocephalus and VPS which require 3+ hours per day of interdisciplinary therapy in a comprehensive inpatient  rehab setting. Physiatrist is providing close team supervision and 24 hour management of active medical problems listed below. Physiatrist and rehab team continue to assess barriers to discharge/monitor patient progress toward functional and medical goals. FIM: FIM - Bathing Bathing Steps Patient Completed: Chest;Right Arm;Left Arm;Abdomen;Front perineal area;Buttocks;Right upper leg;Left upper leg;Right lower leg (including foot);Left lower leg (including foot) Bathing: 4: Min-Patient completes 8-9 86f 10 parts or 75+ percent  FIM - Upper Body Dressing/Undressing Upper body dressing/undressing steps patient completed: Thread/unthread right sleeve of pullover shirt/dresss;Thread/unthread left sleeve of pullover shirt/dress;Put head through opening of pull over shirt/dress;Pull shirt over trunk Upper body dressing/undressing: 5: Set-up assist to: Obtain clothing/put away FIM - Lower Body Dressing/Undressing Lower body dressing/undressing steps patient completed: Thread/unthread left pants leg;Pull pants up/down;Fasten/unfasten left shoe;Don/Doff left shoe Lower body dressing/undressing: 3: Mod-Patient completed 50-74% of tasks  FIM - Toileting Toileting: 0: No continent bowel/bladder events this shift  FIM - Archivist Transfers: 0-Activity did not occur  FIM - Banker Devices: Bed rails;Walker Bed/Chair Transfer: 4: Chair or W/C > Bed: Min A (steadying Pt. > 75%);4: Bed > Chair or W/C: Min A (steadying Pt. > 75%)  FIM - Locomotion: Wheelchair Distance: 40 Locomotion: Wheelchair: 2: Travels 50 - 149 ft with minimal assistance (Pt.>75%) FIM - Locomotion: Ambulation Locomotion: Ambulation Assistive Devices: Designer, industrial/product Ambulation/Gait Assistance: 3: Mod assist Locomotion: Ambulation: 2: Travels 50 - 149 ft with  minimal assistance (Pt.>75%)  Comprehension Comprehension Mode: Auditory Comprehension: 3-Understands basic 50 - 74% of  the time/requires cueing 25 - 50%  of the time  Expression Expression Mode: Verbal Expression: 1-Expresses basis less than 25% of the time/requires cueing greater than 75% of the time.  Social Interaction Social Interaction: 1-Interacts appropriately less than 25% of the time. May be withdrawn or combative.  Problem Solving Problem Solving: 1-Solves basic less than 25% of the time - needs direction nearly all the time or does not effectively solve problems and may need a restraint for safety  Memory Memory: 1-Recognizes or recalls less than 25% of the time/requires cueing greater than 75% of the time Medical Problem List and Plan:  1. subarachnoid hemorrhage with small infarct left internal capsule: Status post IVC placement 08/18/2011 with coiling of aneurysm 08/19/2011 and VP shunt placement 09/02/2011  2. DVT Prophylaxis/Anticoagulation: SCDs. Monitor for any signs of DVT  3. Pain Management: Tylenol for now. Monitor with increased mobility.   4. Neuropsych: This patient is not capable of making decisions on his/her own behalf.  -affect very flat. Ritalin 10mg   -mood assessment.   5. Dysphagia. Dysphagia 1 nectar thick liquids. Follow per speech therapy. Monitor for any signs of aspiration  6. Seizure prophylaxis. Keppra   7. Hypertension. Hydralazine 20 mg 3 times a day, labetalol 200 mg 3 times a day, name of top 60 mg every 4 hours. Good control at present.--back off dosing for now given bp 8. Hypernatremia/hypovolemic. ivf stopped. Seems to be maintaining on her own. 9. Hyperglycemia.  Check blood sugars a.c. and at bedtime. hgb A1c elevated slight. Probably borderline diabetic. Added low dose glyburide. Sugars a little better but dietary intake can be consistent 10. Right ankle pain: gout  -added colchicine which seems to have helped. 11.FEN: megace trial. 12.Seizure  -enzymes not redrawn as ordered--follow up today  -recheck ekg stable  -troponin was elevated but this can  take place with seizures, and in absence of any cardiac symptomatology this appears to be the case.  Cardiology on consult and addressing this  -keppra 500mg  bid  -dc ivf  -activity as tolerated  LOS (Days) 16 A FACE TO FACE EVALUATION WAS PERFORMED  Lynx Goodrich E 09/24/2011, 8:24 AM

## 2011-09-24 NOTE — Progress Notes (Signed)
[facility] Progress Note  Subjective:  No chest pain  Objective:   Vital Signs in the last 24 hours: Temp:  [98.6 F (37 C)] 98.6 F (37 C) (09/21 0500) Pulse Rate:  [69-82] 82  (09/21 1008) Resp:  [17-18] 17  (09/21 0500) BP: (111-138)/(70-91) 133/84 mmHg (09/21 1008) SpO2:  [95 %-99 %] 95 % (09/21 0500) Weight:  [97.3 kg (214 lb 8.1 oz)-100.5 kg (221 lb 9 oz)] 100.5 kg (221 lb 9 oz) (09/21 0500)  Intake/Output from previous day: 09/20 0701 - 09/21 0700 In: 720 [P.O.:720] Out: 1150 [Urine:1150]  Scheduled:   . colchicine  0.6 mg Oral BID  . glyBURIDE  2.5 mg Oral Q breakfast  . hydrocerin   Topical BID  . labetalol  200 mg Oral TID  . levETIRAcetam  500 mg Oral BID  . megestrol  400 mg Oral BID  . methylphenidate  10 mg Oral BID WC  . NiMODipine  60 mg Oral Q4H  . nystatin  5 mL Oral QID  . pantoprazole sodium  40 mg Oral Q1200    Physical Exam:   General appearance: alert, cooperative and no distress Neck: no JVD Lungs: clear to auscultation bilaterally Heart: regular rate and rhythm 1/6 SEM Abdomen: soft, non-tender; bowel sounds normal; no masses,  no organomegaly Extremities: no edema, redness or tenderness in the calves or thighs   Rate: 68  Rhythm: normal sinus rhythm  Lab Results:    Basename 09/22/11 0810 09/21/11 1334  NA 139 140  K 3.8 4.2  CL 109 104  CO2 20 20  GLUCOSE 114* 121*  BUN 21 18  CREATININE 0.90 0.91   Cardiac Panel (last 3 results)  Basename 09/22/11 0810 09/21/11 1519  CKTOTAL -- 85  CKMB -- 5.5*  TROPONINI 0.95* 0.57*  RELINDX -- RELATIVE INDEX IS INVALID    Basename 09/22/11 0810 09/21/11 1519  TROPONINI 0.95* 0.57*   Hepatic Function Panel No results found for this basename: PROT,ALBUMIN,AST,ALT,ALKPHOS,BILITOT,BILIDIR,IBILI in the last 72 hours No results found for this basename: INR in the last 72 hours  Lipid Panel  No results found for this basename: chol, trig, hdl, cholhdl,  vldl, ldlcalc     Imaging:  No results found.    Assessment/Plan:   Principal Problem:  *SAH (subarachnoid hemorrhage) 08/16/11 Active Problems:  Aspiration pneumonia on admission, transient intubation  HTN (hypertension)  Seizure disorder, seizure 09/21/11 while in rehab  Abnormal EKG  Elevated troponin after siezure  Diabetes mellitus, new diagnosis, Hgb A1c 7.2  ECG today shows progressive T wave inversion V1 - 5 and again suggests old IMI. Echo has not been done yet. No chest pain or SOB. On beta blocker Rx with normadyne. For echo later today. Ultimatly may need myoview scan to asses myocardial perfusion.    Lennette Bihari, MD, [facility] 09/24/2011, 1:05 PM

## 2011-09-24 NOTE — Progress Notes (Signed)
Speech Language Pathology Note  Patient Details  Name: [name] MRN: [id number] Date of Birth: [birth year] Today's Date: 09/24/2011  [phone]  [name]  Treatment focus on attention/verbal output, safety with swallowing. Patient required min verbal cues for swallowing strategies, moderate verbal cues for initiation of communication.   [name] 09/24/2011, 3:39 PM

## 2011-09-24 NOTE — Progress Notes (Signed)
Occupational Therapy Note  Patient Details  Name: LALA BEEN MRN: [id number] Date of Birth: 1950-12-01 Today's Date: 09/24/2011 Time: 1130-1145, 15 min  No c/o pain  Pt seen in [name] to facilitate initiation and attention to task. Pt performed self feeding well with no cues need to use RUE.  She used right hand for the full session.  Pt also completed meal without cues.  Very limited social interaction due to decreased verbal expression skills.  Kaylea Mounsey 09/24/2011, 3:42 PM

## 2011-09-25 ENCOUNTER — Inpatient Hospital Stay (HOSPITAL_COMMUNITY): Payer: Medicaid Other | Admitting: Physical Therapy

## 2011-09-25 LAB — GLUCOSE, CAPILLARY: Glucose-Capillary: 101 mg/dL — ABNORMAL HIGH (ref 70–99)

## 2011-09-25 NOTE — Progress Notes (Signed)
Physical Therapy Note  Patient Details  Name: [name] MRN: [id number] Date of Birth: 1950/07/25 Today's Date: 09/25/2011  1600-1630 (30 minutes) individual Pain: no reported pain Focus of treatment: gait training/safety/endurance Treatment: Gait - 55 feet X 3 with RW min assist with posterior loss of balance X 3 when turning or stepping backward.    Mell Guia,JIM 09/25/2011, 8:19 AM

## 2011-09-25 NOTE — Progress Notes (Signed)
Patient ID: [name], female   DOB: [birth year], 61 y.o.   MRN: [id number] Subjective/Complaints: Asleep but arousable, no breakfast thus far  A 12 point review of systems has been performed and if not noted above is otherwise negative.  Objective: Vital Signs: Blood pressure 157/91, pulse 71, temperature 98.7 F (37.1 C), temperature source Oral, resp. rate 20, height 5\' 6"  (1.676 m), weight 94.4 kg (208 lb 1.8 oz), SpO2 95.00%. No results found. No results found for this basename: WBC:2,HGB:2,HCT:2,PLT:2 in the last 72 hours No results found for this basename: NA:2,K:2,CL:2,CO2:2,GLUCOSE:2,BUN:2,CREATININE:2,CALCIUM:2 in the last 72 hours CBG (last 3)   Basename 09/25/11 0822 09/24/11 2044 09/24/11 1705  GLUCAP 101* 136* 77    Wt Readings from Last 3 Encounters:  09/25/11 94.4 kg (208 lb 1.8 oz)  09/07/11 98.7 kg (217 lb 9.5 oz)  09/07/11 98.7 kg (217 lb 9.5 oz)    Physical Exam:  HENT: Eyes:  Pupils are reactive to light and  Neck: No thyromegaly present.  Cardiovascular: Normal rate and regular rhythm.  Pulmonary/Chest: Breath sounds normal. No respiratory distress. She has no wheezes.  Abdominal: Bowel sounds are normal. She exhibits no distension. There is no tenderness.  Neurological:  Patient with flat affect.    Opens eyes, speaks a few words. Nods appropriately. No-focal findings on exam . Upper extremities have 4 minus grip arm flexion extension.  Right lower extremitytrace active movement although once again not clear whether or not she is fully aware of commands she does have right knee pain with range of motion  Left lower extremity has 3 minus hip knee extensor synergy  Right ankle less tender and warm. Right knee improved Skin intact   Assessment/Plan: 1. Functional deficits secondary to SAH/ACA aneurysm with hydrocephalus and VPS which require 3+ hours per day of interdisciplinary therapy in a comprehensive inpatient rehab setting. Physiatrist is  providing close team supervision and 24 hour management of active medical problems listed below. Physiatrist and rehab team continue to assess barriers to discharge/monitor patient progress toward functional and medical goals. FIM: FIM - Bathing Bathing Steps Patient Completed: Chest;Right Arm;Left Arm;Abdomen;Front perineal area;Buttocks;Right upper leg;Left upper leg;Right lower leg (including foot);Left lower leg (including foot) Bathing: 4: Min-Patient completes 8-9 35f 10 parts or 75+ percent  FIM - Upper Body Dressing/Undressing Upper body dressing/undressing steps patient completed: Thread/unthread right sleeve of pullover shirt/dresss;Thread/unthread left sleeve of pullover shirt/dress;Put head through opening of pull over shirt/dress;Pull shirt over trunk Upper body dressing/undressing: 5: Set-up assist to: Obtain clothing/put away FIM - Lower Body Dressing/Undressing Lower body dressing/undressing steps patient completed: Thread/unthread left pants leg;Pull pants up/down;Fasten/unfasten left shoe;Don/Doff left shoe Lower body dressing/undressing: 3: Mod-Patient completed 50-74% of tasks  FIM - Toileting Toileting: 0: No continent bowel/bladder events this shift  FIM - Archivist Transfers: 0-Activity did not occur  FIM - Banker Devices: Bed rails;Walker Bed/Chair Transfer: 4: Chair or W/C > Bed: Min A (steadying Pt. > 75%);4: Bed > Chair or W/C: Min A (steadying Pt. > 75%)  FIM - Locomotion: Wheelchair Distance: 40 Locomotion: Wheelchair: 2: Travels 50 - 149 ft with minimal assistance (Pt.>75%) FIM - Locomotion: Ambulation Locomotion: Ambulation Assistive Devices: Designer, industrial/product Ambulation/Gait Assistance: 3: Mod assist Locomotion: Ambulation: 2: Travels 50 - 149 ft with minimal assistance (Pt.>75%)  Comprehension Comprehension Mode: Auditory Comprehension: 3-Understands basic 50 - 74% of the time/requires cueing 25 -  50%  of the time  Expression Expression Mode: Verbal Expression: 1-Expresses basis  less than 25% of the time/requires cueing greater than 75% of the time.  Social Interaction Social Interaction: 1-Interacts appropriately less than 25% of the time. May be withdrawn or combative.  Problem Solving Problem Solving: 1-Solves basic less than 25% of the time - needs direction nearly all the time or does not effectively solve problems and may need a restraint for safety  Memory Memory: 1-Recognizes or recalls less than 25% of the time/requires cueing greater than 75% of the time Medical Problem List and Plan:  1. subarachnoid hemorrhage with small infarct left internal capsule: Status post IVC placement 08/18/2011 with coiling of aneurysm 08/19/2011 and VP shunt placement 09/02/2011  2. DVT Prophylaxis/Anticoagulation: SCDs. Monitor for any signs of DVT  3. Pain Management: Tylenol for now. Monitor with increased mobility.   4. Neuropsych: This patient is not capable of making decisions on his/her own behalf.  -affect very flat. Ritalin 10mg   -mood assessment.   5. Dysphagia. Dysphagia 1 nectar thick liquids. Follow per speech therapy. Monitor for any signs of aspiration  6. Seizure prophylaxis. Keppra   7. Hypertension. Hydralazine 20 mg 3 times a day, labetalol 200 mg 3 times a day, Nimodipine 60 mg every 4 hours. Good control at present.--back off dosing for now given bp 8. Hypernatremia/hypovolemic. ivf stopped. Seems to be maintaining on her own. 9. Hyperglycemia.  Check blood sugars a.c. and at bedtime. hgb A1c elevated slight. Probably borderline diabetic. Added low dose glyburide. Sugars a little better but dietary intake can be consistent 10. Right ankle pain: gout  -added colchicine which seems to have helped. 11.FEN: megace trial. 12.Seizure  -enzymes not redrawn as ordered--follow up today  -recheck ekg stable  -troponin was elevated but this can take place with seizures, and in  absence of any cardiac symptomatology this appears to be the case.  Cardiology on consult and addressing this  -keppra 500mg  bid    -activity as tolerated  LOS (Days) 17 A FACE TO FACE EVALUATION WAS PERFORMED  [name] 09/25/2011, 8:54 AM

## 2011-09-25 NOTE — Progress Notes (Signed)
Subjective: Sleeping, did not awaken  Objective: Vital signs in last 24 hours: Temp:  [98.7 F (37.1 C)] 98.7 F (37.1 C) (09/22 0459) Pulse Rate:  [67-73] 71  (09/22 0459) Resp:  [18-20] 20  (09/22 0459) BP: (120-157)/(71-91) 157/91 mmHg (09/22 0835) SpO2:  [95 %-100 %] 95 % (09/22 0459) Weight:  [94.4 kg (208 lb 1.8 oz)] 94.4 kg (208 lb 1.8 oz) (09/22 0459) Weight change: -2.9 kg (-6 lb 6.3 oz) Last BM Date: 09/24/11 Intake/Output from previous day: -810 09/21 0701 - 09/22 0700 In: 840 [P.O.:840] Out: 1650 [Urine:1650] Intake/Output this shift:    PE: General:sleeping Heart:S1S2 RRR Lungs:clear ant Abd:+ BS soft non tender Ext:no edema Neuro:sleeping    Lab Results  Component Value Date   HGBA1C 7.2* 09/09/2011    Cardiac Panel (last 3 results) No results found for this basename: CKTOTAL:3,CKMB:3,TROPONINI:3,RELINDX:3 in the last 72 hours  EKG: Orders placed during the hospital encounter of 09/08/11  . EKG 12-LEAD  . EKG 12-LEAD  . EKG 12-LEAD  . EKG 12-LEAD  . EKG 12-LEAD  . EKG 12-LEAD  . EKG 12-LEAD  . EKG 12-LEAD  . EKG 12-LEAD  . EKG 12-LEAD  . EKG 12-LEAD  . EKG 12-LEAD    Studies/Results: 09/24/11  2D ECHO:  - Left ventricle: The cavity size was normal. Wall thickness was increased in a pattern of mild LVH. There was mild concentric hypertrophy. Systolic function was normal. The estimated ejection fraction was in the range of 55% to 65%. Doppler parameters are consistent with abnormal left ventricular relaxation (grade 1 diastolic dysfunction). - Aortic valve: Trileaflet; mildly thickened leaflets. - Mitral valve: Mildly calcified annulus. - Right ventricle: Systolic pressure was increased. - Atrial septum: No defect or patent foramen ovale was identified. - Pulmonary arteries: PA peak pressure: 57mm Hg (S). Impressions:  - The right ventricular systolic pressure was increased consistent with moderate pulmonary  hypertension    Medications: I have reviewed the patient's current medications.    . colchicine  0.6 mg Oral BID  . glyBURIDE  2.5 mg Oral Q breakfast  . hydrocerin   Topical BID  . labetalol  200 mg Oral TID  . levETIRAcetam  500 mg Oral BID  . megestrol  400 mg Oral BID  . methylphenidate  10 mg Oral BID WC  . NiMODipine  60 mg Oral Q4H  . nystatin  5 mL Oral QID  . pantoprazole sodium  40 mg Oral Q1200   Assessment/Plan: Principal Problem:  *SAH (subarachnoid hemorrhage) 08/16/11 Active Problems:  Aspiration pneumonia on admission, transient intubation  HTN (hypertension)  Seizure disorder, seizure 09/21/11 while in rehab  Abnormal EKG  Elevated troponin after siezure  Diabetes mellitus, new diagnosis, Hgb A1c 7.2  PLAN: abnormal EKG with progressive t wave inversions V1-5, poss. Old MI  pk troponin 1.32 See Echo results.  Dr. Tresa Endo believes lexiscan myoview  Is needed. Patient is not capable of making decisions on her behalf. Family will need to sign. Her Husband will be here in the am.     LOS: 17 days   INGOLD,LAURA R 09/25/2011, 11:27 AM   Patient seen and examined. Agree with assessment and plan. I discussed plan for myoview study with patient and family who understand procedure. Will try to set up for tomorrow if schedule allows.   Lennette Bihari, MD, [facility] 09/25/2011 12:30 PM

## 2011-09-26 ENCOUNTER — Inpatient Hospital Stay (HOSPITAL_COMMUNITY): Payer: Self-pay | Admitting: Speech Pathology

## 2011-09-26 ENCOUNTER — Inpatient Hospital Stay (HOSPITAL_COMMUNITY): Payer: Medicaid Other

## 2011-09-26 ENCOUNTER — Inpatient Hospital Stay (HOSPITAL_COMMUNITY): Payer: Self-pay | Admitting: Physical Therapy

## 2011-09-26 ENCOUNTER — Inpatient Hospital Stay (HOSPITAL_COMMUNITY): Payer: Medicaid Other | Admitting: Occupational Therapy

## 2011-09-26 DIAGNOSIS — E870 Hyperosmolality and hypernatremia: Secondary | ICD-10-CM

## 2011-09-26 DIAGNOSIS — Z5189 Encounter for other specified aftercare: Secondary | ICD-10-CM

## 2011-09-26 DIAGNOSIS — I609 Nontraumatic subarachnoid hemorrhage, unspecified: Secondary | ICD-10-CM

## 2011-09-26 LAB — GLUCOSE, CAPILLARY
Glucose-Capillary: 116 mg/dL — ABNORMAL HIGH (ref 70–99)
Glucose-Capillary: 110 mg/dL — ABNORMAL HIGH (ref 70–99)
Glucose-Capillary: 113 mg/dL — ABNORMAL HIGH (ref 70–99)

## 2011-09-26 LAB — BASIC METABOLIC PANEL
BUN: 14 mg/dL (ref 6–23)
Chloride: 112 mEq/L (ref 96–112)
GFR calc non Af Amer: 87 mL/min — ABNORMAL LOW (ref >90)
Glucose, Bld: 122 mg/dL — ABNORMAL HIGH (ref 70–99)
Potassium: 3.6 mEq/L (ref 3.5–5.1)

## 2011-09-26 MED ORDER — TECHNETIUM TC 99M SESTAMIBI GENERIC - CARDIOLITE
30.0000 | Freq: Once | INTRAVENOUS | Status: AC | PRN
  Administered 2011-09-26: 30 via INTRAVENOUS

## 2011-09-26 MED ORDER — REGADENOSON 0.4 MG/5ML IV SOLN
0.4000 mg | Freq: Once | INTRAVENOUS | Status: AC
  Administered 2011-09-26: 0.4 mg via INTRAVENOUS

## 2011-09-26 MED ORDER — TECHNETIUM TC 99M SESTAMIBI GENERIC - CARDIOLITE
10.0000 | Freq: Once | INTRAVENOUS | Status: AC | PRN
  Administered 2011-09-26: 10 via INTRAVENOUS

## 2011-09-26 NOTE — Progress Notes (Signed)
Patient ID: [name], female   DOB: Jan 05, 1950, 61 y.o.   MRN: [id number] Subjective/Complaints: Asleep but arousable, no breakfast thus far  A 12 point review of systems has been performed and if not noted above is otherwise negative.  Objective: Vital Signs: Blood pressure 131/78, pulse 74, temperature 98 F (36.7 C), temperature source Oral, resp. rate 18, height 5\' 6"  (1.676 m), weight 93.2 kg (205 lb 7.5 oz), SpO2 99.00%. No results found. No results found for this basename: WBC:2,HGB:2,HCT:2,PLT:2 in the last 72 hours  Basename 09/26/11 0617  NA 144  K 3.6  CL 112  CO2 19  GLUCOSE 122*  BUN 14  CREATININE 0.79  CALCIUM 9.0   CBG (last 3)   Basename 09/25/11 2120 09/25/11 1637 09/25/11 1208  GLUCAP 154* 135* 85    Wt Readings from Last 3 Encounters:  09/26/11 93.2 kg (205 lb 7.5 oz)  09/07/11 98.7 kg (217 lb 9.5 oz)  09/07/11 98.7 kg (217 lb 9.5 oz)    Physical Exam:  HENT: Eyes:  Pupils are reactive to light and  Neck: No thyromegaly present.  Cardiovascular: Normal rate and regular rhythm.  Pulmonary/Chest: Breath sounds normal. No respiratory distress. She has no wheezes.  Abdominal: Bowel sounds are normal. She exhibits no distension. There is no tenderness.  Neurological:  Patient with flat affect.    Opens eyes, speaks a few words. Nods appropriately. No-focal findings on exam . Upper extremities have 4 minus grip arm flexion extension.  Right lower extremitytrace active movement although once again not clear whether or not she is fully aware of commands she does have right knee pain with range of motion  Left lower extremity has 3 minus hip knee extensor synergy  Right ankle less tender and warm. Right knee improved Skin intact   Assessment/Plan: 1. Functional deficits secondary to SAH/ACA aneurysm with hydrocephalus and VPS which require 3+ hours per day of interdisciplinary therapy in a comprehensive inpatient rehab setting. Physiatrist is  providing close team supervision and 24 hour management of active medical problems listed below. Physiatrist and rehab team continue to assess barriers to discharge/monitor patient progress toward functional and medical goals. FIM: FIM - Bathing Bathing Steps Patient Completed: Chest;Right Arm;Left Arm;Abdomen;Front perineal area;Buttocks;Right upper leg;Left upper leg;Right lower leg (including foot);Left lower leg (including foot) Bathing: 4: Min-Patient completes 8-9 27f 10 parts or 75+ percent  FIM - Upper Body Dressing/Undressing Upper body dressing/undressing steps patient completed: Thread/unthread right sleeve of pullover shirt/dresss;Thread/unthread left sleeve of pullover shirt/dress;Put head through opening of pull over shirt/dress;Pull shirt over trunk Upper body dressing/undressing: 5: Set-up assist to: Obtain clothing/put away FIM - Lower Body Dressing/Undressing Lower body dressing/undressing steps patient completed: Thread/unthread left pants leg;Pull pants up/down;Fasten/unfasten left shoe;Don/Doff left shoe Lower body dressing/undressing: 3: Mod-Patient completed 50-74% of tasks  FIM - Toileting Toileting: 0: No continent bowel/bladder events this shift  FIM - Archivist Transfers: 0-Activity did not occur  FIM - Banker Devices: Bed rails;Walker Bed/Chair Transfer: 4: Chair or W/C > Bed: Min A (steadying Pt. > 75%);4: Bed > Chair or W/C: Min A (steadying Pt. > 75%)  FIM - Locomotion: Wheelchair Distance: 40 Locomotion: Wheelchair: 2: Travels 50 - 149 ft with minimal assistance (Pt.>75%) FIM - Locomotion: Ambulation Locomotion: Ambulation Assistive Devices: Designer, industrial/product Ambulation/Gait Assistance: 3: Mod assist Locomotion: Ambulation: 2: Travels 50 - 149 ft with minimal assistance (Pt.>75%)  Comprehension Comprehension Mode: Auditory Comprehension: 3-Understands basic 50 - 74% of the  time/requires cueing 25 -  50%  of the time  Expression Expression Mode: Verbal Expression: 1-Expresses basis less than 25% of the time/requires cueing greater than 75% of the time.  Social Interaction Social Interaction: 1-Interacts appropriately less than 25% of the time. May be withdrawn or combative.  Problem Solving Problem Solving: 1-Solves basic less than 25% of the time - needs direction nearly all the time or does not effectively solve problems and may need a restraint for safety  Memory Memory: 1-Recognizes or recalls less than 25% of the time/requires cueing greater than 75% of the time Medical Problem List and Plan:  1. subarachnoid hemorrhage with small infarct left internal capsule: Status post IVC placement 08/18/2011 with coiling of aneurysm 08/19/2011 and VP shunt placement 09/02/2011  2. DVT Prophylaxis/Anticoagulation: SCDs. Monitor for any signs of DVT  3. Pain Management: Tylenol for now. Monitor with increased mobility.   4. Neuropsych: This patient is not capable of making decisions on his/her own behalf.  -affect very flat. Ritalin 10mg   -mood assessment.   5. Dysphagia. Dysphagia 1 nectar thick liquids. Follow per speech therapy. Monitor for any signs of aspiration  6. Seizure prophylaxis. Keppra   7. Hypertension. Hydralazine 20 mg 3 times a day, labetalol 200 mg 3 times a day, Nimodipine 60 mg every 4 hours. Good control at present.-  8. Hypernatremia/hypovolemic. ivf stopped. Seems to be maintaining on her own. 9. Hyperglycemia.  Check blood sugars a.c. and at bedtime. hgb A1c elevated slight. Probably borderline diabetic. Added low dose glyburide. Sugars a little better but dietary intake can be consistent 10. Right ankle pain: gout  -added colchicine which seems to have helped. 11.FEN: megace trial. 12.Seizure  --troponin was elevated but this can take place with seizures  -given ekg/echo findings, myoview today, appreciate cardiology assistance. Again no cardiac symptoms   -keppra  500mg  bid   -activity as tolerated  LOS (Days) 18 A FACE TO FACE EVALUATION WAS PERFORMED  SWARTZ,ZACHARY T 09/26/2011, 7:15 AM

## 2011-09-26 NOTE — Progress Notes (Signed)
Pt. Seen and examined. Agree with the NP/PA-C note as written.  Reviewed NST, the calculated EF was 50% percent (but was reported as 15%). The echo confirms a preserved LVEF. There was, however, no reversible ischemia. I suspect her troponin elevation is due to CNS issues. Would continue treatment for hypertension. No further recommendations at this time. Will arrange follow-up with Dr. Tresa Endo and sign-off, call with questions.  Chrystie Nose, MD, [facility] Attending Cardiologist [facility]

## 2011-09-26 NOTE — Progress Notes (Signed)
Subjective:  Awake, alert, non communicative.  Objective:  Vital Signs in the last 24 hours: Temp:  [98 F (36.7 C)-98.5 F (36.9 C)] 98 F (36.7 C) (09/23 0500) Pulse Rate:  [74-76] 74  (09/23 0500) Resp:  [18] 18  (09/23 0500) BP: (126-131)/(76-78) 131/78 mmHg (09/23 0500) SpO2:  [99 %-100 %] 99 % (09/23 0500) Weight:  [93.2 kg (205 lb 7.5 oz)] 93.2 kg (205 lb 7.5 oz) (09/23 0500)  Intake/Output from previous day:  Intake/Output Summary (Last 24 hours) at 09/26/11 1038 Last data filed at 09/26/11 0600  Gross per 24 hour  Intake    480 ml  Output    950 ml  Net   -470 ml    Physical Exam: General appearance: alert, cooperative and no distress Lungs: clear to auscultation bilaterally Heart: regular rate and rhythm   Rate: 84  Rhythm: normal sinus rhythm  Lab Results: No results found for this basename: WBC:2,HGB:2,PLT:2 in the last 72 hours  Basename 09/26/11 0617  NA 144  K 3.6  CL 112  CO2 19  GLUCOSE 122*  BUN 14  CREATININE 0.79   No results found for this basename: TROPONINI:2,CK,MB:2 in the last 72 hours Hepatic Function Panel No results found for this basename: PROT,ALBUMIN,AST,ALT,ALKPHOS,BILITOT,BILIDIR,IBILI in the last 72 hours No results found for this basename: CHOL in the last 72 hours No results found for this basename: INR in the last 72 hours  Imaging: Imaging results have been reviewed  Cardiac Studies:  Assessment/Plan:   Principal Problem:  *SAH (subarachnoid hemorrhage) 08/16/11 Active Problems:  Seizure disorder, seizure 09/21/11 while in rehab  HTN (hypertension)  Elevated troponin after siezure  Aspiration pneumonia on admission, transient intubation  Abnormal EKG  Diabetes mellitus, new diagnosis, Hgb A1c 7.2   Plan- Myoview today   Corine Shelter PA-C 09/26/2011, 10:38 AM

## 2011-09-26 NOTE — Progress Notes (Signed)
This note has been reviewed and this clinician agrees with information provided.  

## 2011-09-26 NOTE — Progress Notes (Signed)
SLP Cancellation Note  Treatment cancelled today due to patient receiving procedure or test. As a result, patient missed 30 minutes of skilled therapy.  Fae Pippin, M.A., CCC-SLP [phone]  Izyan Ezzell 09/26/2011, 1:18 PM

## 2011-09-26 NOTE — Progress Notes (Signed)
SLP Cancellation Note  Treatment missed 45 minutes of skilled SLP intervention today due to pt off unit for procedure.  Geffrey Michaelsen 09/26/2011, 3:40 PM

## 2011-09-26 NOTE — Progress Notes (Signed)
Occupational Therapy Session Note  Patient Details  Name: [name] MRN: [id number] Date of Birth: 1950/11/18  Today's Date: 09/26/2011 Time:  -   Cancel  Skilled Therapeutic Interventions/Progress Updates:    Pt off floor for testing today and was therefore not present during [name],  Therapy Documentation Precautions:  Precautions Precautions: Fall Precaution Comments:  (foley catheter, rectal tube removed) Restrictions Weight Bearing Restrictions: No General: General Amount of Missed OT Time (min): 30 Minutes Missed Time Reason: Other (comment) (Pt went for stress test)         See FIM for current functional status  Therapy/Group: Group Therapy  Roselie Awkward Dixon 09/26/2011, 12:57 PM

## 2011-09-26 NOTE — Progress Notes (Signed)
Occupational Therapy Session Note  Patient Details  Name: [name] MRN: [id number] Date of Birth: [birth year]  Today's Date: 09/26/2011 Time: 0830-0850 Time Calculation (min): 20 min  Short Term Goals: Week 2:  OT Short Term Goal 1 (Week 2): Pt will dress LB with Min A to cross R LE over and hold in place. OT Short Term Goal 2 (Week 2): Pt will maintain standing balance without UE support with steady A in preparation for ADLs. OT Short Term Goal 3 (Week 2): Pt will participate in functional communication with Max cuing to verbalize needs/wants. OT Short Term Goal 4 (Week 2): Pt will perform bathing tasks with Mod verbal and tactile cues for initiation and sequencing. OT Short Term Goal 5 (Week 2): Pt will demonstrate sustained attention to a functional task for 5-7 min with Min verbal cues.   Skilled Therapeutic Interventions/Progress Updates:    self care retraining: session focused on functional mobility, toileting, and sustained attention to task. Pt ambulated to BR using RW with steady A and manual facilitation for direction of pushing walker. Pt performed toilet hygiene with instructional and tactile cues (after therapist had already wiped for cleanliness). Dressed in BR and ambulated back to bed in preparation for Pt's cardiac test this AM. Missed 40 min of therapy due to test.  Therapy Documentation Precautions:  Precautions Precautions: Fall Precaution Comments:  (foley catheter, rectal tube removed) Restrictions Weight Bearing Restrictions: No Pain:  no c/o pain this AM  See FIM for current functional status  Therapy/Group: Individual Therapy  Jackelyn Poling 09/26/2011, 8:57 AM

## 2011-09-27 ENCOUNTER — Inpatient Hospital Stay (HOSPITAL_COMMUNITY): Payer: Medicaid Other | Admitting: Physical Therapy

## 2011-09-27 ENCOUNTER — Inpatient Hospital Stay (HOSPITAL_COMMUNITY): Payer: Medicaid Other | Admitting: Speech Pathology

## 2011-09-27 ENCOUNTER — Inpatient Hospital Stay (HOSPITAL_COMMUNITY): Payer: Medicaid Other | Admitting: Occupational Therapy

## 2011-09-27 LAB — GLUCOSE, CAPILLARY
Glucose-Capillary: 119 mg/dL — ABNORMAL HIGH (ref 70–99)
Glucose-Capillary: 113 mg/dL — ABNORMAL HIGH (ref 70–99)
Glucose-Capillary: 146 mg/dL — ABNORMAL HIGH (ref 70–99)

## 2011-09-27 MED ORDER — LOPERAMIDE HCL 2 MG PO CAPS
2.0000 mg | ORAL_CAPSULE | Freq: Four times a day (QID) | ORAL | Status: DC | PRN
  Filled 2011-09-27: qty 1

## 2011-09-27 MED ORDER — NIMODIPINE 60 MG/20ML PO SOLN
60.0000 mg | Freq: Four times a day (QID) | ORAL | Status: DC
  Administered 2011-09-27 – 2011-10-03 (×21): 60 mg via ORAL
  Filled 2011-09-27 (×26): qty 20

## 2011-09-27 NOTE — Progress Notes (Signed)
Occupational Therapy Session Note  Patient Details  Name: [name] MRN: [id number] Date of Birth: Aug 19, 1950  Today's Date: 09/27/2011 Time: 0865-7846 Time Calculation (min): 60 min   Skilled Therapeutic Interventions/Progress Updates:    self care retraining: session focused on functional mobility and transfers during self care, sit<>stands and standing balance, and initiation and organization of tasks. Pt Min guard for ambulation and functional transfers to toilet and shower chair with RW and steady A for sit<>stands and standing balance. Mod verbal and tactile cues for progression of bathing task due to perseveration on bathing LB. Noted increase in sustained attention to task ~7-8 min and improved initiation requiring Min-Mod verbal cues.  Therapy Documentation Precautions:  Precautions Precautions: Fall Precaution Comments:  (foley catheter, rectal tube removed) Restrictions Weight Bearing Restrictions: No Pain:  no c/o pain this AM  See FIM for current functional status  Therapy/Group: Individual Therapy  Jackelyn Poling 09/27/2011, 12:04 PM

## 2011-09-27 NOTE — Progress Notes (Signed)
This note has been reviewed and this clinician agrees with information provided.  

## 2011-09-27 NOTE — Progress Notes (Signed)
Physical Therapy Note  Patient Details  Name: [name] MRN: [id number] Date of Birth: 09/28/1950 Today's Date: 09/27/2011  Time: 1000-1100 60 minutes  No c/o pain.  Gait training in controlled environment with close supervision-min A.  Gait training in household environment on carpet with obstacle negotiation with min A, cuing to lift R LE in order to prevent toe drag.  Plant watering task with pt requiring mod cuing for initiation and attention to task, max cuing for safety awareness and sequencing for moving watering can and controlling RW with side stepping in functional situation.  Standing tolerance for laundry folding and jig saw puzzle tasks > 10 minutes with close supervision without UE support.  Pt required min cuing for puzzle but able to demo selective attention with min cuing > 10 minutes.  Individual therapy   Chizuko Trine 09/27/2011, 10:56 AM

## 2011-09-27 NOTE — Progress Notes (Signed)
Speech Language Pathology Daily Session Note  Patient Details  Name: [name] MRN: [id number] Date of Birth: 12-Jun-1950  Today's Date: 09/27/2011 Time: 1200-1220 Time Calculation (min): 20 min  Short Term Goals: Week 3: SLP Short Term Goal 1 (Week 3): Pt will initiate functional tasks with Min verbal cues.  SLP Short Term Goal 2 (Week 3): Pt will choose between a field of two with Mod A verbal and question cues.  SLP Short Term Goal 3 (Week 3): Pt will orient to place, time and situation with Max A multimodal cueing SLP Short Term Goal 4 (Week 3): Pt will demonstrate sustained attention to functional task for 5 minutes with Max A verbal cues for redirection SLP Short Term Goal 5 (Week 3): Pt will consume Dys. 3 textures and thin liquids without overt s/s of aspiration and utilize swallowing compensatory strategies with Mod I.   Skilled Therapeutic Interventions: Co-treatment with OT; SLP facilitated session with supervision level vebral cues to initiate and end tasks in a timely manner.  Paitent with increased verbal responses today when questioned by therapist regarding daily activities.  Paitent exhibited no overt s/s of aspiration while consuming Dys.3 textures and thin liquids.    FIM:  Comprehension Comprehension: 3-Understands basic 50 - 74% of the time/requires cueing 25 - 50%  of the time Expression Expression Mode: Verbal Expression: 1-Expresses basis less than 25% of the time/requires cueing greater than 75% of the time. Social Interaction Social Interaction: 2-Interacts appropriately 25 - 49% of time - Needs frequent redirection. Problem Solving Problem Solving: 3-Solves basic 50 - 74% of the time/requires cueing 25 - 49% of the time Memory Memory: 1-Recognizes or recalls less than 25% of the time/requires cueing greater than 75% of the time FIM - Eating Eating Activity: 5: Supervision/cues  Pain Pain Assessment Pain Assessment: No/denies pain  Therapy/Group:  Group Therapy  Charlane Ferretti., CCC-SLP [phone]  [name] 09/27/2011, 1:13 PM

## 2011-09-27 NOTE — Progress Notes (Signed)
Occupational Therapy Session Note  Patient Details  Name: [name] MRN: [id number] Date of Birth: May 28, 1950  Today's Date: 09/27/2011 Time: 1140-1200 Time Calculation (min): 20 min  Short Term Goals: Week 2:  OT Short Term Goal 1 (Week 2): Pt will dress LB with Min A to cross R LE over and hold in place. OT Short Term Goal 2 (Week 2): Pt will maintain standing balance without UE support with steady A in preparation for ADLs. OT Short Term Goal 3 (Week 2): Pt will participate in functional communication with Max cuing to verbalize needs/wants. OT Short Term Goal 4 (Week 2): Pt will perform bathing tasks with Mod verbal and tactile cues for initiation and sequencing. OT Short Term Goal 5 (Week 2): Pt will demonstrate sustained attention to a functional task for 5-7 min with Min verbal cues.   Skilled Therapeutic Interventions/Progress Updates:    [name]: Focus on attention, functional communication during self feeding. Pt able to setup tray with mod cuing and perform self feeding with supervision  Therapy Documentation Precautions:  Precautions Precautions: Fall Precaution Comments:  (foley catheter, rectal tube removed) Restrictions Weight Bearing Restrictions: No Pain: Pain Assessment Pain Assessment: No/denies pain  See FIM for current functional status  Therapy/Group: Individual Therapy  Roney Mans [facility] 09/27/2011, 4:54 PM

## 2011-09-27 NOTE — Progress Notes (Signed)
Patient ID: [name], female   DOB: [birth year], 61 y.o.   MRN: [id number] Subjective/Complaints: Awake working with OT this am. No complaints.  A 12 point review of systems has been performed and if not noted above is otherwise negative.  Objective: Vital Signs: Blood pressure 154/82, pulse 71, temperature 98.5 F (36.9 C), temperature source Oral, resp. rate 19, height 5\' 6"  (1.676 m), weight 93.2 kg (205 lb 7.5 oz), SpO2 98.00%. Nm Myocar Multi W/spect W/wall Motion / Ef  09/26/2011  *RADIOLOGY REPORT*  Clinical Data:  61 year old female with coronary artery disease. The the a the  MYOCARDIAL IMAGING WITH SPECT (REST AND PHARMACOLOGIC-STRESS) GATED LEFT VENTRICULAR WALL MOTION STUDY LEFT VENTRICULAR EJECTION FRACTION  Technique:  Resting myocardial SPECT imaging was initially performed after intravenous administration of radiopharmaceutical. Myocardial SPECT was subsequently performed after additional radiopharmaceutical injection during pharmacologic-stress supervised by the Cardiology staff.  Quantitative gated imaging was also performed to evaluate left ventricular wall motion, and estimate left ventricular ejection fraction.  Radiopharmaceutical:  Tc-38m Myoview at rest and during stress.  Comparison: none  Findings:  Technique: Study is adequate.  Perfusion:  There are no relative decreased counts on stress or rest to suggest reversible ischemia or infarction.  Wall motion:   Hypokinesia of the lateral wall.  Left ventricular ejection fraction:  Calculated left ventricular ejection fraction =  15%  IMPRESSION:  1. No reversible ischemia or infarction. 2.  Lateral wall hypokinesia.  3.  Left ventricular ejection fraction equal 15%   Original Report Authenticated By: Genevive Bi, M.D.    No results found for this basename: WBC:2,HGB:2,HCT:2,PLT:2 in the last 72 hours  Basename 09/26/11 0617  NA 144  K 3.6  CL 112  CO2 19  GLUCOSE 122*  BUN 14  CREATININE 0.79    CALCIUM 9.0   CBG (last 3)   Basename 09/27/11 0739 09/26/11 2102 09/26/11 1656  GLUCAP 119* 113* 110*    Wt Readings from Last 3 Encounters:  09/26/11 93.2 kg (205 lb 7.5 oz)  09/07/11 98.7 kg (217 lb 9.5 oz)  09/07/11 98.7 kg (217 lb 9.5 oz)    Physical Exam:  HENT: Eyes:  Pupils are reactive to light and  Neck: No thyromegaly present.  Cardiovascular: Normal rate and regular rhythm.  Pulmonary/Chest: Breath sounds normal. No respiratory distress. She has no wheezes.  Abdominal: Bowel sounds are normal. She exhibits no distension. There is no tenderness.  Neurological:  Patient with flat affect.    Opens eyes, speaks a few words. Nods appropriately. Delayed processing but showing some improvement. No-focal findings on exam . Upper extremities have 4 minus grip arm flexion extension.  Right lower extremitytrace active movement although once again not clear whether or not she is fully aware of commands she does have right knee pain with range of motion  Left lower extremity has 3 minus hip knee extensor synergy  Right ankle less tender and warm. Right knee improved Skin intact   Assessment/Plan: 1. Functional deficits secondary to SAH/ACA aneurysm with hydrocephalus and VPS which require 3+ hours per day of interdisciplinary therapy in a comprehensive inpatient rehab setting. Physiatrist is providing close team supervision and 24 hour management of active medical problems listed below. Physiatrist and rehab team continue to assess barriers to discharge/monitor patient progress toward functional and medical goals. FIM: FIM - Bathing Bathing Steps Patient Completed: Chest;Right Arm;Left Arm;Abdomen;Front perineal area;Buttocks;Right upper leg;Left upper leg;Right lower leg (including foot);Left lower leg (including foot) Bathing: 4: Min-Patient  completes 8-9 34f 10 parts or 75+ percent  FIM - Upper Body Dressing/Undressing Upper body dressing/undressing steps patient completed:  Thread/unthread right sleeve of pullover shirt/dresss;Thread/unthread left sleeve of pullover shirt/dress;Put head through opening of pull over shirt/dress;Pull shirt over trunk Upper body dressing/undressing: 5: Set-up assist to: Obtain clothing/put away FIM - Lower Body Dressing/Undressing Lower body dressing/undressing steps patient completed: Thread/unthread left pants leg;Pull pants up/down;Fasten/unfasten left shoe;Don/Doff left shoe Lower body dressing/undressing: 3: Mod-Patient completed 50-74% of tasks  FIM - Toileting Toileting: 1: Total-Patient completed zero steps, helper did all 3  FIM - Diplomatic Services operational officer Devices: Grab bars Toilet Transfers: 4-To toilet/BSC: Min A (steadying Pt. > 75%);4-From toilet/BSC: Min A (steadying Pt. > 75%)  FIM - Bed/Chair Transfer Bed/Chair Transfer Assistive Devices: Bed rails;Walker Bed/Chair Transfer: 5: Supine > Sit: Supervision (verbal cues/safety issues)  FIM - Locomotion: Wheelchair Distance: 40 Locomotion: Wheelchair: 2: Travels 50 - 149 ft with minimal assistance (Pt.>75%) FIM - Locomotion: Ambulation Locomotion: Ambulation Assistive Devices: Designer, industrial/product Ambulation/Gait Assistance: 3: Mod assist Locomotion: Ambulation: 2: Travels 50 - 149 ft with minimal assistance (Pt.>75%)  Comprehension Comprehension Mode: Auditory Comprehension: 3-Understands basic 50 - 74% of the time/requires cueing 25 - 50%  of the time  Expression Expression Mode: Verbal Expression: 1-Expresses basis less than 25% of the time/requires cueing greater than 75% of the time.  Social Interaction Social Interaction: 1-Interacts appropriately less than 25% of the time. May be withdrawn or combative.  Problem Solving Problem Solving: 1-Solves basic less than 25% of the time - needs direction nearly all the time or does not effectively solve problems and may need a restraint for safety  Memory Memory: 1-Recognizes or recalls less  than 25% of the time/requires cueing greater than 75% of the time Medical Problem List and Plan:  1. subarachnoid hemorrhage with small infarct left internal capsule: Status post IVC placement 08/18/2011 with coiling of aneurysm 08/19/2011 and VP shunt placement 09/02/2011  2. DVT Prophylaxis/Anticoagulation: SCDs. Monitor for any signs of DVT  3. Pain Management: Tylenol for now. Monitor with increased mobility.   4. Neuropsych: This patient is not capable of making decisions on his/her own behalf.  -affect very flat. Ritalin 10mg   -mood assessment.   5. Dysphagia. Dysphagia 1 nectar thick liquids. Follow per speech therapy. Monitor for any signs of aspiration  6. Seizure prophylaxis. Keppra   7. Hypertension. Hydralazine 20 mg 3 times a day, labetalol 200 mg 3 times a day, Nimodipine 60 mg every 4 hours. Good control at present.-  8. Hypernatremia/hypovolemic. ivf stopped. Seems to be maintaining on her own. 9. Hyperglycemia.  Check blood sugars a.c. and at bedtime. hgb A1c elevated slight. Probably borderline diabetic. Added low dose glyburide. Will go ahead and hold for now 10. Right ankle pain: gout  -added colchicine which seems to have helped. 11.FEN: megace trial. 12.Seizure  --troponin was elevated but this can take place with seizures  -given ekg/echo findings, myoview completed. Awaiting results. No symptoms at present  -keppra 500mg  bid   -activity as tolerated  LOS (Days) 19 A FACE TO FACE EVALUATION WAS PERFORMED  SWARTZ,ZACHARY T 09/27/2011, 10:13 AM

## 2011-09-27 NOTE — Progress Notes (Signed)
Nutrition Follow-up  Intervention:   1. Continue to encourage PO intake of meals as able 2. RD to continue to follow  Assessment:   Upgraded to Dysphagia 3 diet on 9/23. Continues to have improved intake, eating 75 - 100% of meals. Continues on megace.  Diet Order:  Dysphagia 3 with thins  Meds: Scheduled Meds:    . colchicine  0.6 mg Oral BID  . hydrocerin   Topical BID  . labetalol  200 mg Oral TID  . levETIRAcetam  500 mg Oral BID  . megestrol  400 mg Oral BID  . methylphenidate  10 mg Oral BID WC  . NiMODipine  60 mg Oral Q4H  . nystatin  5 mL Oral QID  . pantoprazole sodium  40 mg Oral Q1200  . DISCONTD: glyBURIDE  2.5 mg Oral Q breakfast   Continuous Infusions:  PRN Meds:.acetaminophen, albuterol, food thickener, ondansetron (ZOFRAN) IV, ondansetron, senna-docusate, sodium chloride, sodium chloride, sorbitol  Labs:  CMP     Component Value Date/Time   NA 144 09/26/2011 0617   K 3.6 09/26/2011 0617   CL 112 09/26/2011 0617   CO2 19 09/26/2011 0617   GLUCOSE 122* 09/26/2011 0617   BUN 14 09/26/2011 0617   CREATININE 0.79 09/26/2011 0617   CALCIUM 9.0 09/26/2011 0617   PROT 6.8 09/09/2011 0600   ALBUMIN 2.6* 09/09/2011 0600   AST 82* 09/09/2011 0600   ALT 78* 09/09/2011 0600   ALKPHOS 84 09/09/2011 0600   BILITOT 0.4 09/09/2011 0600   GFRNONAA 87* 09/26/2011 0617   GFRAA >90 09/26/2011 0617   CBG (last 3)   Basename 09/27/11 1134 09/27/11 0739 09/26/11 2102  GLUCAP 113* 119* 113*      Intake/Output Summary (Last 24 hours) at 09/27/11 1431 Last data filed at 09/27/11 0612  Gross per 24 hour  Intake    240 ml  Output   1450 ml  Net  -1210 ml  BM 9/23  Weight Status:  93.2 kg - stable  Estimated needs:  1600 - 1800 kcal, 115 - 130 grams protein  Nutrition Dx:  Inadequate oral intake r/t decreased appetite AEB variable meal completion. Resolved.  Goal: Pt to meet >/= 90% of their estimated nutrition needs; improving  Monitor: weight trends, lab trends, I/O's, PO  intake, supplement tolerance  Jarold Motto MS, RD, LDN Pager: [phone] After-hours pager: [phone]

## 2011-09-27 NOTE — Patient Care Conference (Signed)
Inpatient RehabilitationTeam Conference Note Date: 09/27/2011   Time: 2:50 PM    Patient Name: [name]      Medical Record Number: [id number]  Date of Birth: 1950-05-08 Sex: Female         Room/Bed: 4030/4030-01 Payor Info: Payor: MEDICAID [place]  Plan: MEDICAID OF [place]  Product Type: *No Product type*     Admitting Diagnosis: SAH   Admit Date/Time:  09/08/2011  4:30 PM Admission Comments: No comment available   Primary Diagnosis:  SAH (subarachnoid hemorrhage) Principal Problem: SAH (subarachnoid hemorrhage)  Patient Active Problem List   Diagnosis Date Noted  . Abnormal EKG 09/23/2011  . Elevated troponin after siezure 09/23/2011  . Diabetes mellitus, new diagnosis, Hgb A1c 7.2 09/23/2011  . Seizure disorder, seizure 09/21/11 while in rehab 09/22/2011  . SAH (subarachnoid hemorrhage) 08/16/11 09/09/2011  . Vasospasm 08/24/2011  . Subarachnoid hemorrhage 08/16/2011  . Encephalopathy 08/16/2011  . Aspiration pneumonia on admission, transient intubation 08/16/2011  . Acute respiratory failure 08/16/2011  . HTN (hypertension) 08/16/2011    Expected Discharge Date: Expected Discharge Date:  (SNF)  Team Members Present: Physician: Dr. Faith Rogue Social Worker Present: Amada Jupiter, LCSW Nurse Present: Carmie End, RN PT Present: Reggy Eye, PT OT Present: Roney Mans, OT;Ardis Rowan, Corky Crafts, OT SLP Present: Feliberto Gottron, SLP Other (Discipline and Name): Tora Duck, PPS Coordinator     Current Status/Progress Goal Weekly Team Focus  Medical   seizure last week. elevated troponin---cardiac workup  improve initiation and processing  cardiac work up, myoview   Bowel/Bladder   Incontinent of bowel, LBM 9/24; foley cathether in place  Mod. Assist  d/c foley, attempt timed toileting   Swallow/Nutrition/ Hydration   Dys. 3 textures and thin liquids, Intermittent supervision  Mod I  tolerance of upgraded textures   ADL's   S for UB, Mod A LB, Total A  toileting, Min A functional mobility, Max cues for initiation and planning  Min A overall  motor planning, initiation, sustained attention, functional mobility, functional communication   Mobility   min A  min A  attention, awareness, activity tolerance   Communication   Max-total A  Mod A  initiaiton of verbal expression   Safety/Cognition/ Behavioral Observations  Max A  Mod A  attention, orientation, problem solving, working memory    Pain   Denies         Skin   Skin dry, intact; eucerin cream applied.            *See Interdisciplinary Assessment and Plan and progress notes for long and short-term goals  Barriers to Discharge: profound neuro deficits, see prior    Possible Resolutions to Barriers:  education, ?alternative dispo    Discharge Planning/Teaching Needs:  plan has changed to SNF      Team Discussion:  Medical issues but overall with some improved attention.  Concern with possible changes in breathing - MD made aware.    Revisions to Treatment Plan:  D/c plan changed to SNF   Continued Need for Acute Rehabilitation Level of Care: The patient requires daily medical management by a physician with specialized training in physical medicine and rehabilitation for the following conditions: Daily direction of a multidisciplinary physical rehabilitation program to ensure safe treatment while eliciting the highest outcome that is of practical value to the patient.: Yes Daily medical management of patient stability for increased activity during participation in an intensive rehabilitation regime.: Yes Daily analysis of laboratory values and/or radiology reports with any  subsequent need for medication adjustment of medical intervention for : Neurological problems;Other;Cardiac problems  [name] 09/28/2011, 4:31 PM

## 2011-09-27 NOTE — Progress Notes (Signed)
Speech Language Pathology Daily Session Note  Patient Details  Name: [name] MRN: [id number] Date of Birth: 04-27-1950  Today's Date: 09/27/2011 Time: 1330-1400 Time Calculation (min): 30 min  Short Term Goals: Week 3: SLP Short Term Goal 1 (Week 3): Pt will initiate functional tasks with Min verbal cues.  SLP Short Term Goal 2 (Week 3): Pt will choose between a field of two with Mod A verbal and question cues.  SLP Short Term Goal 3 (Week 3): Pt will orient to place, time and situation with Max A multimodal cueing SLP Short Term Goal 4 (Week 3): Pt will demonstrate sustained attention to functional task for 5 minutes with Max A verbal cues for redirection SLP Short Term Goal 5 (Week 3): Pt will consume Dys. 3 textures and thin liquids without overt s/s of aspiration and utilize swallowing compensatory strategies with Mod I.   Skilled Therapeutic Interventions: Treatment focus on functional problem solving and initiation of verbal expression. Pt required Mod verbal and question cues for redirection to basic money management task, however, completed functional problem solving with counting and making change with supervision verbal cues.  Pt answered functional questions with Max A multimodal cueing, however, pt demonstrating increase length of utterance with verbal expression. Pt grimacing during session and required Max A question cues for request to use the bathroom. Pt transferred to commode with overall Min verbal cues for safety awareness.    FIM:  Comprehension Comprehension Mode: Auditory Comprehension: 3-Understands basic 50 - 74% of the time/requires cueing 25 - 50%  of the time Expression Expression Mode: Verbal Expression: 1-Expresses basis less than 25% of the time/requires cueing greater than 75% of the time. Social Interaction Social Interaction: 2-Interacts appropriately 25 - 49% of time - Needs frequent redirection. Problem Solving Problem Solving: 3-Solves basic  50 - 74% of the time/requires cueing 25 - 49% of the time Memory Memory: 1-Recognizes or recalls less than 25% of the time/requires cueing greater than 75% of the time FIM - Eating Eating Activity: 5: Supervision/cues  Pain Pain Assessment Pain Assessment: No/denies pain  Therapy/Group: Individual Therapy  Barnett Elzey 09/27/2011, 2:23 PM

## 2011-09-28 ENCOUNTER — Inpatient Hospital Stay (HOSPITAL_COMMUNITY): Payer: Medicaid Other | Admitting: Speech Pathology

## 2011-09-28 ENCOUNTER — Inpatient Hospital Stay (HOSPITAL_COMMUNITY): Payer: Medicaid Other | Admitting: Occupational Therapy

## 2011-09-28 ENCOUNTER — Inpatient Hospital Stay (HOSPITAL_COMMUNITY): Payer: Medicaid Other | Admitting: Physical Therapy

## 2011-09-28 DIAGNOSIS — R569 Unspecified convulsions: Secondary | ICD-10-CM

## 2011-09-28 DIAGNOSIS — Z5189 Encounter for other specified aftercare: Secondary | ICD-10-CM

## 2011-09-28 DIAGNOSIS — E870 Hyperosmolality and hypernatremia: Secondary | ICD-10-CM

## 2011-09-28 DIAGNOSIS — I609 Nontraumatic subarachnoid hemorrhage, unspecified: Secondary | ICD-10-CM

## 2011-09-28 LAB — GLUCOSE, CAPILLARY: Glucose-Capillary: 133 mg/dL — ABNORMAL HIGH (ref 70–99)

## 2011-09-28 NOTE — Progress Notes (Signed)
Social Work Patient ID: [name], female   DOB: 1950/07/31, 61 y.o.   MRN: [id number]  Have reviewed team conference with husband and pt - plan continues to be SNf - fl2 out and awaiting bed offer.  Bolton Canupp

## 2011-09-28 NOTE — Progress Notes (Signed)
Speech Language Pathology Daily Session Note  Patient Details  Name: [name] MRN: [id number] Date of Birth: [birth year]  Today's Date: 09/28/2011 Time: 1345-1430 Time Calculation (min): 45 min  Short Term Goals: Week 3: SLP Short Term Goal 1 (Week 3): Pt will initiate functional tasks with Min verbal cues.  SLP Short Term Goal 2 (Week 3): Pt will choose between a field of two with Mod A verbal and question cues.  SLP Short Term Goal 3 (Week 3): Pt will orient to place, time and situation with Max A multimodal cueing SLP Short Term Goal 4 (Week 3): Pt will demonstrate sustained attention to functional task for 5 minutes with Max A verbal cues for redirection SLP Short Term Goal 5 (Week 3): Pt will consume Dys. 3 textures and thin liquids without overt s/s of aspiration and utilize swallowing compensatory strategies with Mod I.   Skilled Therapeutic Interventions: Treatment focus on initiation of verbal expression and problem solving. Pt with increased verbal responses to clinician's questions with decreased processing time needed. Pt required Max A for functional problem solving and self-monitoring/correcting during navigation and problem solving task with a familiar website.    FIM:  Comprehension Comprehension Mode: Auditory Comprehension: 5-Understands basic 90% of the time/requires cueing < 10% of the time Expression Expression Mode: Verbal Expression: 2-Expresses basic 25 - 49% of the time/requires cueing 50 - 75% of the time. Uses single words/gestures. Social Interaction Social Interaction: 3-Interacts appropriately 50 - 74% of the time - May be physically or verbally inappropriate. Problem Solving Problem Solving: 3-Solves basic 50 - 74% of the time/requires cueing 25 - 49% of the time Memory Memory: 3-Recognizes or recalls 50 - 74% of the time/requires cueing 25 - 49% of the time FIM - Eating Eating Activity: 5: Supervision/cues  Pain Pain Assessment Pain  Assessment: No/denies pain  Therapy/Group: Individual Therapy  [name] 09/28/2011, 3:54 PM

## 2011-09-28 NOTE — Progress Notes (Signed)
Patient ID: [name], female   DOB: [birth year], 61 y.o.   MRN: [id number] Subjective/Complaints: No complaints today. Denies and SOB, CP A 12 point review of systems has been performed and if not noted above is otherwise negative.  Objective: Vital Signs: Blood pressure 157/80, pulse 65, temperature 98.9 F (37.2 C), temperature source Oral, resp. rate 18, height 5\' 6"  (1.676 m), weight 93.2 kg (205 lb 7.5 oz), SpO2 98.00%. Nm Myocar Multi W/spect W/wall Motion / Ef  09/26/2011  *RADIOLOGY REPORT*  Clinical Data:  61 year old female with coronary artery disease. The the a the  MYOCARDIAL IMAGING WITH SPECT (REST AND PHARMACOLOGIC-STRESS) GATED LEFT VENTRICULAR WALL MOTION STUDY LEFT VENTRICULAR EJECTION FRACTION  Technique:  Resting myocardial SPECT imaging was initially performed after intravenous administration of radiopharmaceutical. Myocardial SPECT was subsequently performed after additional radiopharmaceutical injection during pharmacologic-stress supervised by the Cardiology staff.  Quantitative gated imaging was also performed to evaluate left ventricular wall motion, and estimate left ventricular ejection fraction.  Radiopharmaceutical:  Tc-64m Myoview at rest and during stress.  Comparison: none  Findings:  Technique: Study is adequate.  Perfusion:  There are no relative decreased counts on stress or rest to suggest reversible ischemia or infarction.  Wall motion:   Hypokinesia of the lateral wall.  Left ventricular ejection fraction:  Calculated left ventricular ejection fraction =  15%  IMPRESSION:  1. No reversible ischemia or infarction. 2.  Lateral wall hypokinesia.  3.  Left ventricular ejection fraction equal 15%   Original Report Authenticated By: Genevive Bi, M.D.    No results found for this basename: WBC:2,HGB:2,HCT:2,PLT:2 in the last 72 hours  Basename 09/26/11 0617  NA 144  K 3.6  CL 112  CO2 19  GLUCOSE 122*  BUN 14  CREATININE 0.79  CALCIUM 9.0    CBG (last 3)   Basename 09/27/11 2110 09/27/11 1706 09/27/11 1134  GLUCAP 140* 146* 113*    Wt Readings from Last 3 Encounters:  09/26/11 93.2 kg (205 lb 7.5 oz)  09/07/11 98.7 kg (217 lb 9.5 oz)  09/07/11 98.7 kg (217 lb 9.5 oz)    Physical Exam:  HENT: Eyes:  Pupils are reactive to light and  Neck: No thyromegaly present.  Cardiovascular: Normal rate and regular rhythm.  Pulmonary/Chest: Breath sounds normal. No respiratory distress. She has no wheezes.  Abdominal: Bowel sounds are normal. She exhibits no distension. There is no tenderness.  Neurological:  Patient with flat affect.    Opens eyes, speaks a few words. Nods appropriately. Delayed processing but showing some improvement. No-focal findings on exam . Upper extremities have 4 minus grip arm flexion extension.  Right lower extremitytrace active movement although once again not clear whether or not she is fully aware of commands she does have right knee pain with range of motion  Left lower extremity has 3 minus hip knee extensor synergy  Right ankle less tender and warm. Right knee improved Skin intact   Assessment/Plan: 1. Functional deficits secondary to SAH/ACA aneurysm with hydrocephalus and VPS which require 3+ hours per day of interdisciplinary therapy in a comprehensive inpatient rehab setting. Physiatrist is providing close team supervision and 24 hour management of active medical problems listed below. Physiatrist and rehab team continue to assess barriers to discharge/monitor patient progress toward functional and medical goals. FIM: FIM - Bathing Bathing Steps Patient Completed: Chest;Right Arm;Left Arm;Front perineal area;Abdomen;Right lower leg (including foot);Left lower leg (including foot);Buttocks;Right upper leg;Left upper leg Bathing: 5: Set-up assist to: Obtain  items  FIM - Upper Body Dressing/Undressing Upper body dressing/undressing steps patient completed: Thread/unthread right sleeve of  pullover shirt/dresss;Thread/unthread left sleeve of pullover shirt/dress;Put head through opening of pull over shirt/dress;Pull shirt over trunk Upper body dressing/undressing: 5: Set-up assist to: Obtain clothing/put away FIM - Lower Body Dressing/Undressing Lower body dressing/undressing steps patient completed: Thread/unthread right pants leg;Thread/unthread left pants leg;Pull pants up/down;Don/Doff right shoe;Don/Doff left shoe;Fasten/unfasten right shoe;Fasten/unfasten left shoe Lower body dressing/undressing: 4: Min-Patient completed 75 plus % of tasks  FIM - Toileting Toileting steps completed by patient: Adjust clothing prior to toileting Toileting: 3: Mod-Patient completed 2 of 3 steps  FIM - Diplomatic Services operational officer Devices: Best boy Transfers: 4-To toilet/BSC: Min A (steadying Pt. > 75%)  FIM - Bed/Chair Transfer Bed/Chair Transfer Assistive Devices: Therapist, occupational: 4: Supine > Sit: Min A (steadying Pt. > 75%/lift 1 leg);4: Sit > Supine: Min A (steadying pt. > 75%/lift 1 leg);4: Chair or W/C > Bed: Min A (steadying Pt. > 75%)  FIM - Locomotion: Wheelchair Distance: 40 Locomotion: Wheelchair: 2: Travels 50 - 149 ft with minimal assistance (Pt.>75%) FIM - Locomotion: Ambulation Locomotion: Ambulation Assistive Devices: Designer, industrial/product Ambulation/Gait Assistance: 4: Min guard Locomotion: Ambulation: 2: Travels 50 - 149 ft with minimal assistance (Pt.>75%)  Comprehension Comprehension Mode: Auditory Comprehension: 3-Understands basic 50 - 74% of the time/requires cueing 25 - 50%  of the time  Expression Expression Mode: Verbal Expression: 1-Expresses basis less than 25% of the time/requires cueing greater than 75% of the time.  Social Interaction Social Interaction: 2-Interacts appropriately 25 - 49% of time - Needs frequent redirection.  Problem Solving Problem Solving: 3-Solves basic 50 - 74% of the time/requires cueing 25  - 49% of the time  Memory Memory: 1-Recognizes or recalls less than 25% of the time/requires cueing greater than 75% of the time Medical Problem List and Plan:  1. subarachnoid hemorrhage with small infarct left internal capsule: Status post IVC placement 08/18/2011 with coiling of aneurysm 08/19/2011 and VP shunt placement 09/02/2011  2. DVT Prophylaxis/Anticoagulation: SCDs. Monitor for any signs of DVT  3. Pain Management: Tylenol for now. Monitor with increased mobility.   4. Neuropsych: This patient is not capable of making decisions on his/her own behalf.  -affect very flat. Ritalin 10mg   -mood assessment.   5. Dysphagia. Dysphagia 1 nectar thick liquids. Follow per speech therapy. Monitor for any signs of aspiration  6. Seizure prophylaxis. Keppra   7. Hypertension. Hydralazine 20 mg 3 times a day, labetalol 200 mg 3 times a day, Nimodipine 60 mg every 6 hours---taper down. Good control at present.-  8. Hypernatremia/hypovolemic. ivf stopped. Seems to be maintaining on her own. 9. Hyperglycemia.  Check blood sugars a.c. and at bedtime. hgb A1c elevated slight. Probably borderline diabetic. Added low dose glyburide. Will go ahead and hold for now 10. Right ankle pain: gout  -colchicine likely causing diarrhea--holding for now 11.FEN: megace trial. 12.Seizure  -keppra 500mg  bid   -activity as tolerated 13. Cards: myoview revealed left wall hypokinesia and an LVEF of 14%  -follow up plan per cardiology  LOS (Days) 20 A FACE TO FACE EVALUATION WAS PERFORMED  SWARTZ,ZACHARY T 09/28/2011, 7:06 AM

## 2011-09-28 NOTE — Progress Notes (Signed)
Physical Therapy Note  Patient Details  Name: [name] MRN: [id number] Date of Birth: Dec 06, 1950 Today's Date: 09/28/2011  Time: 1000-1100 60 minutes  No c/o pain.  Pt performed w/c mobility with hand over hand to initiate, pt able to propel with min A due to R UE weakness/inattention requiring assist to steer.  Pt requires mod cuing to attend to task.  Stair negotiation x 6 stairs with B handrails with min A, cues for safety and technique.  Gait training in home environment with min A for obstacle negotiation with RW.  Pt requires cues to avoid obstacles and to safely navigate tight spaces.  Standing jigsaw puzzle activity with pt able to sustain attention x 10 minutes with supervision cues, then requiring mod-max cuing for attention with increased fatigue.  Pt required mod A for problem solving puzzle activity.  Overall pt with decreased sustained and selective attention and initiation today, improving activity tolerance to standing and gait training.  Individual therapy   [name] 09/28/2011, 10:55 AM

## 2011-09-28 NOTE — Progress Notes (Signed)
Occupational Therapy Session Note  Patient Details  Name: [name] MRN: [id number] Date of Birth: 21-Jun-1950  Today's Date: 09/28/2011 Time: 1130-1200 Time Calculation (min): 30 min    Skilled Therapeutic Interventions/Progress Updates:    [employer] c;iub with focus on initiation with functional tasks, social functional communication, setup of meal with mod questioning cues.  Therapy Documentation Precautions:  Precautions Precautions: Fall Precaution Comments:  (foley catheter, rectal tube removed) Restrictions Weight Bearing Restrictions: No    Pain: Pain Assessment Pain Assessment: No/denies pain  See FIM for current functional status  Therapy/Group: Group Therapy  Roney Mans [facility] 09/28/2011, 2:03 PM

## 2011-09-28 NOTE — Progress Notes (Signed)
Occupational Therapy Session Note  Patient Details  Name: [name] MRN: [id number] Date of Birth: 1950-01-24  Today's Date: 09/28/2011 Time: 0830-0930 Time Calculation (min): 60 min  Short Term Goals: Week 2:  OT Short Term Goal 1 (Week 2): Pt will dress LB with Min A to cross R LE over and hold in place. OT Short Term Goal 2 (Week 2): Pt will maintain standing balance without UE support with steady A in preparation for ADLs. OT Short Term Goal 3 (Week 2): Pt will participate in functional communication with Max cuing to verbalize needs/wants. OT Short Term Goal 4 (Week 2): Pt will perform bathing tasks with Mod verbal and tactile cues for initiation and sequencing. OT Short Term Goal 5 (Week 2): Pt will demonstrate sustained attention to a functional task for 5-7 min with Min verbal cues.   Skilled Therapeutic Interventions/Progress Updates:    Focused on initiation and sustained attention during self care tasks and functional communication with toileting, bathing, dressing, and grooming tasks. Pt has improved initiation requiring Min instructional cues for completion of bathing and Min questioning cues for dressing; increased sustained attention ~70min with very familiar task. Pt required Mod verbal cuing for expression of need to use the BR; improved orientation with ability to state correct day of the week and correct month with one questioning cue to correct mistake. Pt with increased verbalizations during session and able to make choice for clothing item from field of three.   Therapy Documentation Precautions:  Precautions Precautions: Fall Precaution Comments:  (foley catheter, rectal tube removed) Restrictions Weight Bearing Restrictions: No Pain: Pain Assessment Pain Assessment: No/denies pain  See FIM for current functional status  Therapy/Group: Individual Therapy  Jackelyn Poling 09/28/2011, 2:07 PM

## 2011-09-28 NOTE — Progress Notes (Signed)
Speech Language Pathology Daily Session Note  Patient Details  Name: [name] MRN: [id number] Date of Birth: May 30, 1950  Today's Date: 09/28/2011 Time: 1200-1215 Time Calculation (min): 15 min  Short Term Goals: Week 3: SLP Short Term Goal 1 (Week 3): Pt will initiate functional tasks with Min verbal cues.  SLP Short Term Goal 2 (Week 3): Pt will choose between a field of two with Mod A verbal and question cues.  SLP Short Term Goal 3 (Week 3): Pt will orient to place, time and situation with Max A multimodal cueing SLP Short Term Goal 4 (Week 3): Pt will demonstrate sustained attention to functional task for 5 minutes with Max A verbal cues for redirection SLP Short Term Goal 5 (Week 3): Pt will consume Dys. 3 textures and thin liquids without overt s/s of aspiration and utilize swallowing compensatory strategies with Mod I.   Skilled Therapeutic Interventions: Co-treat with OT; SLP facilitated session with supervision level verbal cues to initiate and complete basic familiar tasks in a timely manner. Patient exhibited  increased verbal responses today when questioned about  daily activities in group. Patient exhibited no overt s/s of aspiration while consuming Dys. 3 textures and thin liquids.      FIM:  Comprehension Comprehension Mode: Auditory Comprehension: 5-Understands basic 90% of the time/requires cueing < 10% of the time Expression Expression: 2-Expresses basic 25 - 49% of the time/requires cueing 50 - 75% of the time. Uses single words/gestures. Social Interaction Social Interaction: 4-Interacts appropriately 75 - 89% of the time - Needs redirection for appropriate language or to initiate interaction. Problem Solving Problem Solving: 4-Solves basic 75 - 89% of the time/requires cueing 10 - 24% of the time Memory Memory: 3-Recognizes or recalls 50 - 74% of the time/requires cueing 25 - 49% of the time FIM - Eating Eating Activity: 5: Supervision/cues  Pain Pain  Assessment Pain Assessment: No/denies pain  Therapy/Group: Group Therapy  Jackalyn Lombard, Conrad Quenemo  Graduate Clinician Speech Language Pathology   Page, Joni Reining 09/28/2011, 1:32 PM  The above skilled treatment note has been reviewed and SLP is in agreement. Fae Pippin, M.A., CCC-SLP [phone]

## 2011-09-29 ENCOUNTER — Inpatient Hospital Stay (HOSPITAL_COMMUNITY): Payer: Medicaid Other | Admitting: Occupational Therapy

## 2011-09-29 ENCOUNTER — Inpatient Hospital Stay (HOSPITAL_COMMUNITY): Payer: Medicaid Other | Admitting: Speech Pathology

## 2011-09-29 ENCOUNTER — Inpatient Hospital Stay (HOSPITAL_COMMUNITY): Payer: Medicaid Other | Admitting: Physical Therapy

## 2011-09-29 LAB — URINALYSIS, ROUTINE W REFLEX MICROSCOPIC
Bilirubin Urine: NEGATIVE
Glucose, UA: NEGATIVE mg/dL
Specific Gravity, Urine: 1.014 (ref 1.005–1.030)
Urobilinogen, UA: 0.2 mg/dL (ref 0.0–1.0)
pH: 8 (ref 5.0–8.0)

## 2011-09-29 LAB — URINE MICROSCOPIC-ADD ON

## 2011-09-29 LAB — GLUCOSE, CAPILLARY
Glucose-Capillary: 128 mg/dL — ABNORMAL HIGH (ref 70–99)
Glucose-Capillary: 124 mg/dL — ABNORMAL HIGH (ref 70–99)

## 2011-09-29 NOTE — Progress Notes (Signed)
Physical Therapy Note  Patient Details  Name: [name] MRN: [id number] Date of Birth: Aug 02, 1950 Today's Date: 09/29/2011  Time: 1000-1057 57 minutes  No c/o pain.  Therapeutic activity visiting gift shop.  Pt able to maneuver RW through gift shop with min A, supervision cuing for obstacle negotiation at first, after 20+ minutes standing pt required mod-max verbal cues to avoid obstacles when exiting store.  Pt able to identify prices of items and engage in limited conversation regarding items she would like to purchase.  On return to unit pt asked to recall 5 items she saw in the gift shop, pt required max encouragement and cuing to name items.  Individual therapy   Jamiee Milholland 09/29/2011, 10:58 AM

## 2011-09-29 NOTE — Progress Notes (Signed)
This note has been reviewed and this clinician agrees with information provided.  

## 2011-09-29 NOTE — Progress Notes (Signed)
Patient ID: [name], female   DOB: 03-11-1950, 61 y.o.   MRN: [id number] Subjective/Complaints: No complaints today. Denies and SOB, CP. Up with OT in shower this am A 12 point review of systems has been performed and if not noted above is otherwise negative.  Objective: Vital Signs: Blood pressure 164/90, pulse 65, temperature 98.9 F (37.2 C), temperature source Oral, resp. rate 20, height 5\' 6"  (1.676 m), weight 93 kg (205 lb 0.4 oz), SpO2 100.00%. No results found. No results found for this basename: WBC:2,HGB:2,HCT:2,PLT:2 in the last 72 hours No results found for this basename: NA:2,K:2,CL:2,CO2:2,GLUCOSE:2,BUN:2,CREATININE:2,CALCIUM:2 in the last 72 hours CBG (last 3)   Basename 09/29/11 0712 09/28/11 2117 09/28/11 1121  GLUCAP 128* 210* 140*    Wt Readings from Last 3 Encounters:  09/28/11 93 kg (205 lb 0.4 oz)  09/07/11 98.7 kg (217 lb 9.5 oz)  09/07/11 98.7 kg (217 lb 9.5 oz)    Physical Exam:  HENT: Eyes:  Pupils are reactive to light and  Neck: No thyromegaly present.  Cardiovascular: Normal rate and regular rhythm.  Pulmonary/Chest: Breath sounds normal. No respiratory distress. She has no wheezes.  Abdominal: Bowel sounds are normal. She exhibits no distension. There is no tenderness.  Neurological:  Patient with flat affect.    Opens eyes, speaks a few words. Nods appropriately. Delayed processing but showing some improvement. No-focal findings on exam . Upper extremities have 4 minus grip arm flexion extension.  Right lower extremitytrace active movement although once again not clear whether or not she is fully aware of commands she does have right knee pain with range of motion  Left lower extremity has 3 minus hip knee extensor synergy  Right ankle less tender and warm. Right knee improved Skin intact   Assessment/Plan: 1. Functional deficits secondary to SAH/ACA aneurysm with hydrocephalus and VPS which require 3+ hours per day of interdisciplinary  therapy in a comprehensive inpatient rehab setting. Physiatrist is providing close team supervision and 24 hour management of active medical problems listed below. Physiatrist and rehab team continue to assess barriers to discharge/monitor patient progress toward functional and medical goals. FIM: FIM - Bathing Bathing Steps Patient Completed: Chest;Right Arm;Right lower leg (including foot);Left Arm;Abdomen;Front perineal area;Left lower leg (including foot);Buttocks;Right upper leg;Left upper leg Bathing: 5: Set-up assist to: Obtain items  FIM - Upper Body Dressing/Undressing Upper body dressing/undressing steps patient completed: Thread/unthread right sleeve of pullover shirt/dresss;Thread/unthread left sleeve of pullover shirt/dress;Put head through opening of pull over shirt/dress;Pull shirt over trunk Upper body dressing/undressing: 5: Set-up assist to: Obtain clothing/put away FIM - Lower Body Dressing/Undressing Lower body dressing/undressing steps patient completed: Thread/unthread right pants leg;Thread/unthread left pants leg;Pull pants up/down;Pull underwear up/down;Fasten/unfasten pants;Don/Doff right shoe;Fasten/unfasten left shoe;Fasten/unfasten right shoe;Don/Doff left shoe Lower body dressing/undressing: 4: Steadying Assist  FIM - Toileting Toileting steps completed by patient: Adjust clothing prior to toileting;Performs perineal hygiene;Adjust clothing after toileting Toileting: 4: Steadying assist  FIM - Diplomatic Services operational officer Devices: Walker;Grab bars Toilet Transfers: 5-To toilet/BSC: Supervision (verbal cues/safety issues);5-From toilet/BSC: Supervision (verbal cues/safety issues)  FIM - Banker Devices: Therapist, occupational: 4: Bed > Chair or W/C: Min A (steadying Pt. > 75%);4: Chair or W/C > Bed: Min A (steadying Pt. > 75%)  FIM - Locomotion: Wheelchair Distance: 40 Locomotion: Wheelchair: 2:  Travels 50 - 149 ft with minimal assistance (Pt.>75%) FIM - Locomotion: Ambulation Locomotion: Ambulation Assistive Devices: Designer, industrial/product Ambulation/Gait Assistance: 4: Min guard Locomotion: Ambulation: 2: [employer]  50 - 149 ft with minimal assistance (Pt.>75%)  Comprehension Comprehension Mode: Auditory Comprehension: 5-Understands complex 90% of the time/Cues < 10% of the time  Expression Expression Mode: Verbal Expression: 2-Expresses basic 25 - 49% of the time/requires cueing 50 - 75% of the time. Uses single words/gestures.  Social Interaction Social Interaction: 3-Interacts appropriately 50 - 74% of the time - May be physically or verbally inappropriate.  Problem Solving Problem Solving: 3-Solves basic 50 - 74% of the time/requires cueing 25 - 49% of the time  Memory Memory: 3-Recognizes or recalls 50 - 74% of the time/requires cueing 25 - 49% of the time Medical Problem List and Plan:  1. subarachnoid hemorrhage with small infarct left internal capsule: Status post IVC placement 08/18/2011 with coiling of aneurysm 08/19/2011 and VP shunt placement 09/02/2011  2. DVT Prophylaxis/Anticoagulation: SCDs. Monitor for any signs of DVT  3. Pain Management: Tylenol for now. Monitor with increased mobility.   4. Neuropsych: This patient is not capable of making decisions on his/her own behalf.  -affect very flat. Ritalin 10mg   -mood assessment.   5. Dysphagia. Dysphagia 3 thin liquids. Follow per speech therapy. Monitor for any signs of aspiration  6. Seizure prophylaxis. Keppra   7. Hypertension. Hydralazine 20 mg 3 times a day, labetalol 200 mg 3 times a day, Nimodipine 60 mg every 6 hours---taper down seems to have led to some elevation in BP. Discuss with cards regarding further adjustments, convert to long acting CCB  8. Hypernatremia/hypovolemic. ivf stopped. Seems to be maintaining on her own. 9. Hyperglycemia. Consider resuming low dose amaryl  10. Right ankle pain:  gout  -colchicine stopped due to diarrhea 11.FEN: megace trial. 12.Seizure  -keppra 500mg  bid   -activity as tolerated 13. Cards: myoview revealed left wall hypokinesia and an LVEF of 15%  -follow up plan per cardiology  LOS (Days) 21 A FACE TO FACE EVALUATION WAS PERFORMED  SWARTZ,ZACHARY T 09/29/2011, 8:00 AM

## 2011-09-29 NOTE — Progress Notes (Signed)
Occupational Therapy Session Note  Patient Details  Name: [name] MRN: [id number] Date of Birth: 1950-11-20  Today's Date: 09/29/2011 Time: 0730-0830 Time Calculation (min): 60 min  Short Term Goals: Week 2:  OT Short Term Goal 1 (Week 2): Pt will dress LB with Min A to cross R LE over and hold in place. OT Short Term Goal 2 (Week 2): Pt will maintain standing balance without UE support with steady A in preparation for ADLs. OT Short Term Goal 3 (Week 2): Pt will participate in functional communication with Max cuing to verbalize needs/wants. OT Short Term Goal 4 (Week 2): Pt will perform bathing tasks with Mod verbal and tactile cues for initiation and sequencing. OT Short Term Goal 5 (Week 2): Pt will demonstrate sustained attention to a functional task for 5-7 min with Min verbal cues.   Skilled Therapeutic Interventions/Progress Updates:    Focused on initiation, organization and sequencing of familiar, routine self care tasks with Min instructional cues and improved selective attention in minimally distractive environment. Min verbal cues for performance of new task of obtaining clothing items needed for dressing. Also focused on functional communication with increased verbalizations; pt engaged in conversation with therapist and gave response to questions in timely manner. Dynamic standing balance supervision when at least unilateral UE supported.  Therapy Documentation Precautions:  Precautions Precautions: Fall Precaution Comments:  (foley catheter, rectal tube removed) Restrictions Weight Bearing Restrictions: No Pain:  no c/o pain this AM  See FIM for current functional status  Therapy/Group: Individual Therapy  Jackelyn Poling 09/29/2011, 2:47 PM

## 2011-09-29 NOTE — Progress Notes (Signed)
Speech Language Pathology Session & Weekly Progress Notes  Patient Details  Name: [name] MRN: [id number] Date of Birth: Dec 17, 1950  Today's Date: 09/29/2011 Time: 2440-1027 Time Calculation (min): 45 min  Short Term Goals: Week 3: SLP Short Term Goal 1 (Week 3): Pt will initiate functional tasks with Min verbal cues.  SLP Short Term Goal 1 - Progress (Week 3): Met SLP Short Term Goal 2 (Week 3): Pt will choose between a field of two with Mod A verbal and question cues.  SLP Short Term Goal 2 - Progress (Week 3): Met SLP Short Term Goal 3 (Week 3): Pt will orient to place, time and situation with Max A multimodal cueing SLP Short Term Goal 3 - Progress (Week 3): Not met SLP Short Term Goal 4 (Week 3): Pt will demonstrate sustained attention to functional task for 5 minutes with Max A verbal cues for redirection SLP Short Term Goal 4 - Progress (Week 3): Met SLP Short Term Goal 5 (Week 3): Pt will consume Dys. 3 textures and thin liquids without overt s/s of aspiration and utilize swallowing compensatory strategies with Mod I.  SLP Short Term Goal 5 - Progress (Week 3): Met  New Short Term Goals:  Week 4: SLP Short Term Goal 1 (Week 4): Pt will demonstrate sustained attention to a functional task for 10 minutes with Max A verbal cues for redirection SLP Short Term Goal 2 (Week 4): Pt will orient to place, time, and situation with Max A visual and semantic cues. SLP Short Term Goal 3 (Week 4): Pt will identify 1 physical and 1 cognitive deficit with Max A multimodal cueing SLP Short Term Goal 4 (Week 4): Pt will initiate verbal expression from a  field of two for a functional item with Min A multimodal cueing.  SLP Short Term Goal 5 (Week 4): Pt will initiate functional tasks with Mod I.   Weekly Progress Updates: Pt has made functional gains and has met 4 out of 5 STG's this admission.  Currently, pt is overall total A for orientation, intellectual awareness of deficits, and for  initiation of verbal expression, Mod A for functional problem solving and sustained attention and supervision for initiation of functional tasks.  Pt is currently tolerating regular textures with thin liquids without overt s/s of aspiration and intermittent supervision-Mod I for utilization of swallowing compensatory strategies. Pt would benefit from continued skilled SLP services to maximize overall cognitive function and independence.    SLP Frequency: 1-2 X/day, 30-60 minutes Estimated Length of Stay: TBD SLP Treatment/Interventions: Cognitive remediation/compensation;Cueing hierarchy;Functional tasks;Environmental controls;Internal/external aids;Patient/family education;Therapeutic Activities  Daily Session Skilled Therapeutic Intervention: Treatment focus on initiation of verbal expression, orientation and intellectual awareness. Pt did not initiate verbal expression or interaction with clinician but did respond to clinician's questions appropriately. Pt required total A multimodal cueing and use of visual aid (mirror) to orient to situation and to identify 1 physical and 1 cognitive deficit. Pt also given trial of regular textures and demonstrated efficient mastication without overt s/s of aspiration. Pt's diet upgraded to regular textures.  FIM:  Comprehension Comprehension Mode: Auditory Comprehension: 5-Understands basic 90% of the time/requires cueing < 10% of the time Expression Expression Mode: Verbal Expression: 2-Expresses basic 25 - 49% of the time/requires cueing 50 - 75% of the time. Uses single words/gestures. Social Interaction Social Interaction: 3-Interacts appropriately 50 - 74% of the time - May be physically or verbally inappropriate. Problem Solving Problem Solving: 3-Solves basic 50 - 74% of the time/requires  cueing 25 - 49% of the time Memory Memory: 2-Recognizes or recalls 25 - 49% of the time/requires cueing 51 - 75% of the time Pain No/Denies  Pain  Therapy/Group: Individual Therapy  Carmelo Reidel 09/29/2011, 1:36 PM

## 2011-09-29 NOTE — Progress Notes (Signed)
Physical Therapy Weekly Progress Note  Patient Details  Name: [name] MRN: [id number] Date of Birth: April 11, 1950  Today's Date: 09/29/2011  Patient has met 2 of 3 short term goals.  Pt has improved mobility and gait, continues to be limited by decreased sustained attention, awareness, problem solving and memory which limits progress with w/c mobility and functional tasks.  Patient continues to demonstrate the following deficits: decreased attention, awareness, problem solving, memory, balance and gait and therefore will continue to benefit from skilled PT intervention to enhance overall performance with activity tolerance, balance, ability to compensate for deficits, attention, awareness and coordination.  Patient progressing toward long term goals..  Continue plan of care.  PT Short Term Goals Week 3:  PT Short Term Goal 1 (Week 3): Pt will perform funcitonal transfers consistently with min A PT Short Term Goal 1 - Progress (Week 3): Met PT Short Term Goal 2 (Week 3): Pt will gait 25' in home environment with min A PT Short Term Goal 2 - Progress (Week 3): Met PT Short Term Goal 3 (Week 3): Pt will propel w/c in controlled environment 30' with supervision PT Short Term Goal 3 - Progress (Week 3): Progressing toward goal  Skilled Therapeutic Interventions/Progress Updates:  Ambulation/gait training;Community reintegration;Balance/vestibular training;Discharge planning;Cognitive remediation/compensation;Functional mobility training;Therapeutic Activities;UE/LE Coordination activities;Wheelchair propulsion/positioning;Stair training;Patient/family education;Pain management;Splinting/orthotics;UE/LE Strength taining/ROM;Therapeutic Exercise;Neuromuscular re-education;DME/adaptive equipment instruction   See FIM for current functional status   Bob Daversa 09/29/2011, 7:56 AM

## 2011-09-29 NOTE — Progress Notes (Signed)
Speech Language Pathology Daily Session Note  Patient Details  Name: [name] MRN: [id number] Date of Birth: [birth year]  Today's Date: 09/29/2011 Time: 1130-1150 Time Calculation (min): 20 min  Short Term Goals: Week 4: SLP Short Term Goal 1 (Week 4): Pt will demonstrate sustained attention to a functional task for 10 minutes with Max A verbal cues for redirection SLP Short Term Goal 2 (Week 4): Pt will orient to place, time, and situation with Max A visual and semantic cues. SLP Short Term Goal 3 (Week 4): Pt will identify 1 physical and 1 cognitive deficit with Max A multimodal cueing SLP Short Term Goal 4 (Week 4): Pt will initiate verbal expression from a  field of two for a functional item with Min A multimodal cueing.  SLP Short Term Goal 5 (Week 4): Pt will initiate functional tasks with Mod I.   Skilled Therapeutic Interventions: Group treatment with OT; Patient consumed regular textures and thin liquids with no overt s/s of aspiration and supervision level verbal cues to initiate, sequence and solve basic problems related to self-feeding.     FIM:  FIM - Eating Eating Activity: 5: Supervision/cues  Pain Pain Assessment Pain Assessment: No/denies pain  Therapy/Group: Group Therapy  Charlane Ferretti., CCC-SLP [phone]  Cha Gomillion 09/29/2011, 5:40 PM

## 2011-09-29 NOTE — Progress Notes (Signed)
Occupational Therapy Weekly Progress Note  Patient Details  Name: [name] MRN: [id number] Date of Birth: January 30, 1950  Today's Date: 09/29/2011  Patient has met 5 of 5 short term goals.  Pt is currently supervision for bathing, UB dressing, toileting, grooming and functional mobility; Min A for LB dressing and dynamic standing balance; Min verbal cues for initiation and organization of familiar  Functional tasks; Min-Mod verbal cues for functional communication. Therapy has focused on functional mobility, decreasing the amount of cues needed to complete routine self care tasks and communication for expression of wants/needs.  Pt will benefit from further skilled OT services to decrease burden of care, maximize independence with ADLs, and increase ability to communicate functionally.   Patient continues to demonstrate the following deficits: muscle weakness especially in right LE challenging her ability to cross or lift her LE for dressing, decreased cardiorespiratoy endurance, decreased initiation, decreased attention, decreased awareness, decreased problem solving, decreased memory and delayed processing and decreased standing balance and decreased balance strategies  and therefore will continue to benefit from skilled OT intervention to enhance overall performance with BADL.  Patient progressing toward long term goals..  Continue plan of care.  OT Short Term Goals Week 1:  OT Short Term Goal 1 (Week 1): Pt. will feed self with right hand for 75 % of meal OT Short Term Goal 1 - Progress (Week 1): Met OT Short Term Goal 2 (Week 1): Pt. will bath upper bodly with minimal assist OT Short Term Goal 2 - Progress (Week 1): Met OT Short Term Goal 3 (Week 1): Pt. will sit unsupported for 15 minutes during grooming task OT Short Term Goal 3 - Progress (Week 1): Met OT Short Term Goal 4 (Week 1): Pt. will dress LB with moderate ssist and moderate assist with balance for 1 minute.   OT Short Term  Goal 4 - Progress (Week 1): Met Week 2:  OT Short Term Goal 1 (Week 2): Pt will dress LB with Min A to cross R LE over and hold in place. OT Short Term Goal 1 - Progress (Week 2): Met OT Short Term Goal 2 (Week 2): Pt will maintain standing balance without UE support with steady A in preparation for ADLs. OT Short Term Goal 2 - Progress (Week 2): Met OT Short Term Goal 3 (Week 2): Pt will participate in functional communication with Max cuing to verbalize needs/wants. OT Short Term Goal 3 - Progress (Week 2): Met OT Short Term Goal 4 (Week 2): Pt will perform bathing tasks with Mod verbal and tactile cues for initiation and sequencing. OT Short Term Goal 4 - Progress (Week 2): Met OT Short Term Goal 5 (Week 2): Pt will demonstrate sustained attention to a functional task for 5-7 min with Min verbal cues.  OT Short Term Goal 5 - Progress (Week 2): Met Week 3:  OT Short Term Goal 1 (Week 3): Pt will perform LB dressing with supervision. OT Short Term Goal 2 (Week 3): Pt will indicate need to use BR to family/staff with Max cues. OT Short Term Goal 3 (Week 3): Pt will maintain dynamic standing balance with supervision. OT Short Term Goal 4 (Week 3): Pt will demonstrate alternating attention during functional task with Mod cues. OT Short Term Goal 5 (Week 3): Pt will be oriented x3 with external aides with Min cues.   Therapy Documentation Precautions:  Precautions Precautions: Fall Precaution Comments:  (foley catheter, rectal tube removed) Restrictions Weight Bearing Restrictions: No Pain: Pain  Assessment Pain Assessment: No/denies pain  See FIM for current functional status  Therapy/Group: Individual Therapy  Jackelyn Poling 09/29/2011, 4:37 PM

## 2011-09-30 ENCOUNTER — Inpatient Hospital Stay (HOSPITAL_COMMUNITY): Payer: Medicaid Other | Admitting: Physical Therapy

## 2011-09-30 ENCOUNTER — Inpatient Hospital Stay (HOSPITAL_COMMUNITY): Payer: Medicaid Other | Admitting: Speech Pathology

## 2011-09-30 ENCOUNTER — Inpatient Hospital Stay (HOSPITAL_COMMUNITY): Payer: Medicaid Other | Admitting: *Deleted

## 2011-09-30 ENCOUNTER — Encounter (HOSPITAL_COMMUNITY): Payer: Medicaid Other | Admitting: Occupational Therapy

## 2011-09-30 DIAGNOSIS — Z5189 Encounter for other specified aftercare: Secondary | ICD-10-CM

## 2011-09-30 DIAGNOSIS — E870 Hyperosmolality and hypernatremia: Secondary | ICD-10-CM

## 2011-09-30 DIAGNOSIS — R569 Unspecified convulsions: Secondary | ICD-10-CM

## 2011-09-30 DIAGNOSIS — I609 Nontraumatic subarachnoid hemorrhage, unspecified: Secondary | ICD-10-CM

## 2011-09-30 LAB — URINE CULTURE: Colony Count: >=100000

## 2011-09-30 LAB — GLUCOSE, CAPILLARY
Glucose-Capillary: 153 mg/dL — ABNORMAL HIGH (ref 70–99)
Glucose-Capillary: 130 mg/dL — ABNORMAL HIGH (ref 70–99)

## 2011-09-30 LAB — URINALYSIS, ROUTINE W REFLEX MICROSCOPIC
Bilirubin Urine: NEGATIVE
Glucose, UA: NEGATIVE mg/dL
Ketones, ur: NEGATIVE mg/dL
Protein, ur: 30 mg/dL — AB
pH: 6 (ref 5.0–8.0)

## 2011-09-30 LAB — URINE MICROSCOPIC-ADD ON

## 2011-09-30 NOTE — Progress Notes (Signed)
Speech Language Pathology Daily Session Note  Patient Details  Name: [name] MRN: [id number] Date of Birth: [birth year]  Today's Date: 09/30/2011 Time: 1400-1430 Time Calculation (min): 30 min  Short Term Goals: Week 4: SLP Short Term Goal 1 (Week 4): Pt will demonstrate sustained attention to a functional task for 10 minutes with Max A verbal cues for redirection SLP Short Term Goal 2 (Week 4): Pt will orient to place, time, and situation with Max A visual and semantic cues. SLP Short Term Goal 3 (Week 4): Pt will identify 1 physical and 1 cognitive deficit with Max A multimodal cueing SLP Short Term Goal 4 (Week 4): Pt will initiate verbal expression from a  field of two for a functional item with Min A multimodal cueing.  SLP Short Term Goal 5 (Week 4): Pt will initiate functional tasks with Mod I.   Skilled Therapeutic Interventions: Treatment focused on attention, initiation of verbal responses to basic-level biographical questions, and orientation to time/place/situation. Patient required moderate frequency of verbal cues to initiate responses to basic level questions, and maximal cues to elaborate on resopnses. Patient utilized calendar with minimal cues when asked what the date was, but required verbal and visual cues to locate name of therapist on wall board, and then required moderate cues to initiate verbal expression to read the name out loud. 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 (this was confirmed by her family members who arrived at end of session). No demonstration of awareness to situation, deficits, or recall of daily events or therapy tasks completed, even when provided with semantic and written cues. When asked "how do you call for help?" , patient reached for call button and pressed the nursing call button  without any cues.   FIM:  Comprehension Comprehension Mode: Auditory Comprehension: 5-Understands basic 90% of the time/requires cueing < 10% of the time Expression Expression Mode: Verbal Expression: 2-Expresses basic 25 - 49% of the time/requires cueing 50 - 75% of the time. Uses single words/gestures. Social Interaction Social Interaction: 3-Interacts appropriately 50 - 74% of the time - May be physically or verbally inappropriate. Problem Solving Problem Solving: 2-Solves basic 25 - 49% of the time - needs direction more than half the time to initiate, plan or complete simple activities Memory Memory: 2-Recognizes or recalls 25 - 49% of the time/requires cueing 51 - 75% of the time  Pain Pain Assessment Pain Assessment: No/denies pain  Therapy/Group: Individual Therapy  Pablo Lawrence 09/30/2011, 5:12 PM  Angela Nevin, MA, CCC-SLP [facility] Speech-Language Pathologist

## 2011-09-30 NOTE — Progress Notes (Signed)
Physical Therapy Session Note  Patient Details  Name: [name] MRN: [id number] Date of Birth: 04-Nov-1950  Today's Date: 09/30/2011 Time: 1345-1430 Time Calculation (min): 45 min  Short Term Goals: Week 3:  PT Short Term Goal 1 (Week 3): Pt will perform funcitonal transfers consistently with min A PT Short Term Goal 1 - Progress (Week 3): Met PT Short Term Goal 2 (Week 3): Pt will gait 25' in home environment with min A PT Short Term Goal 2 - Progress (Week 3): Met PT Short Term Goal 3 (Week 3): Pt will propel w/c in controlled environment 37' with supervision PT Short Term Goal 3 - Progress (Week 3): Progressing toward goal  Skilled Therapeutic Interventions/Progress Updates:    Pt had fall earlier but no adverse effects noted and pt without c/o pain or discomfort.  Therapeutic activity working on standing balance and use of BUE simutaneously, pt with difficulty using both hands at once to throw a ball up and catch it but could bounce and catch easily with B hands.  Initially min cues in min-mod distractive environment to attend to task but max by end. Min A to correct one LOB, but mostly close S. Sit to stands without Ue use close S except with fatigue min A.  Pt needed encouragement to express/speak.  Therapy Documentation Precautions:  Precautions Precautions: Fall Precaution Comments:  (foley catheter, rectal tube removed) Restrictions Weight Bearing Restrictions: No      See FIM for current functional status  Therapy/Group: Individual Therapy  Michaelene Song 09/30/2011, 3:46 PM

## 2011-09-30 NOTE — Progress Notes (Signed)
Patient ID: [name], female   DOB: 1950/12/27, 61 y.o.   MRN: [id number] Subjective/Complaints: No complaints today. Denies and SOB, CP. Up with OT again this am. A 12 point review of systems has been performed and if not noted above is otherwise negative.  Objective: Vital Signs: Blood pressure 152/88, pulse 62, temperature 97.9 F (36.6 C), temperature source Oral, resp. rate 20, height 5\' 6"  (1.676 m), weight 98.3 kg (216 lb 11.4 oz), SpO2 98.00%. No results found. No results found for this basename: WBC:2,HGB:2,HCT:2,PLT:2 in the last 72 hours No results found for this basename: NA:2,K:2,CL:2,CO2:2,GLUCOSE:2,BUN:2,CREATININE:2,CALCIUM:2 in the last 72 hours CBG (last 3)   Basename 09/30/11 0736 09/29/11 2057 09/29/11 1649  GLUCAP 132* 167* 172*    Wt Readings from Last 3 Encounters:  09/30/11 98.3 kg (216 lb 11.4 oz)  09/07/11 98.7 kg (217 lb 9.5 oz)  09/07/11 98.7 kg (217 lb 9.5 oz)    Physical Exam:  HENT: Eyes:  Pupils are reactive to light and  Neck: No thyromegaly present.  Cardiovascular: Normal rate and regular rhythm.  Pulmonary/Chest: Breath sounds normal. No respiratory distress. She has no wheezes.  Abdominal: Bowel sounds are normal. She exhibits no distension. There is no tenderness.  Neurological:  Patient with flat affect.    Opens eyes, speaks a few words. Nods appropriately. Delayed processing but showing some improvement. No-focal findings on exam . Upper extremities have 4 minus grip arm flexion extension.  Right lower extremitytrace active movement although once again not clear whether or not she is fully aware of commands she does have right knee pain with range of motion  Left lower extremity has 3 minus hip knee extensor synergy  Right ankle less tender and warm. Right knee improved Skin intact   Assessment/Plan: 1. Functional deficits secondary to SAH/ACA aneurysm with hydrocephalus and VPS which require 3+ hours per day of interdisciplinary  therapy in a comprehensive inpatient rehab setting. Physiatrist is providing close team supervision and 24 hour management of active medical problems listed below. Physiatrist and rehab team continue to assess barriers to discharge/monitor patient progress toward functional and medical goals. FIM: FIM - Bathing Bathing Steps Patient Completed: Chest;Right Arm;Left Arm;Abdomen;Front perineal area;Buttocks;Right upper leg;Left upper leg;Right lower leg (including foot);Left lower leg (including foot) Bathing: 6: More than reasonable amount of time  FIM - Upper Body Dressing/Undressing Upper body dressing/undressing steps patient completed: Thread/unthread right sleeve of pullover shirt/dresss;Thread/unthread left sleeve of pullover shirt/dress;Put head through opening of pull over shirt/dress;Pull shirt over trunk Upper body dressing/undressing: 6: Assistive device (Comment) FIM - Lower Body Dressing/Undressing Lower body dressing/undressing steps patient completed: Thread/unthread right underwear leg;Thread/unthread left underwear leg;Pull underwear up/down;Thread/unthread right pants leg;Thread/unthread left pants leg;Pull pants up/down;Don/Doff right shoe;Don/Doff left shoe;Fasten/unfasten right shoe;Fasten/unfasten left shoe Lower body dressing/undressing: 4: Steadying Assist  FIM - Toileting Toileting steps completed by patient: Adjust clothing prior to toileting;Performs perineal hygiene;Adjust clothing after toileting Toileting: 5: Supervision: Safety issues/verbal cues  FIM - Archivist Transfers Assistive Devices: Walker;Grab bars Toilet Transfers: 5-To toilet/BSC: Supervision (verbal cues/safety issues);5-From toilet/BSC: Supervision (verbal cues/safety issues)  FIM - Banker Devices: Therapist, occupational: 4: Bed > Chair or W/C: Min A (steadying Pt. > 75%);4: Chair or W/C > Bed: Min A (steadying Pt. > 75%)  FIM - Locomotion:  Wheelchair Distance: 40 Locomotion: Wheelchair: 2: Travels 50 - 149 ft with minimal assistance (Pt.>75%) FIM - Locomotion: Ambulation Locomotion: Ambulation Assistive Devices: Designer, industrial/product Ambulation/Gait Assistance: 4: Min guard  Locomotion: Ambulation: 4: Travels 150 ft or more with minimal assistance (Pt.>75%)  Comprehension Comprehension Mode: Auditory Comprehension: 5-Understands basic 90% of the time/requires cueing < 10% of the time  Expression Expression Mode: Verbal Expression: 2-Expresses basic 25 - 49% of the time/requires cueing 50 - 75% of the time. Uses single words/gestures.  Social Interaction Social Interaction: 3-Interacts appropriately 50 - 74% of the time - May be physically or verbally inappropriate.  Problem Solving Problem Solving: 3-Solves basic 50 - 74% of the time/requires cueing 25 - 49% of the time  Memory Memory: 2-Recognizes or recalls 25 - 49% of the time/requires cueing 51 - 75% of the time Medical Problem List and Plan:  1. subarachnoid hemorrhage with small infarct left internal capsule: Status post IVC placement 08/18/2011 with coiling of aneurysm 08/19/2011 and VP shunt placement 09/02/2011  2. DVT Prophylaxis/Anticoagulation: SCDs. Monitor for any signs of DVT  3. Pain Management: Tylenol for now. Monitor with increased mobility.   4. Neuropsych: This patient is not capable of making decisions on his/her own behalf.  -affect very flat. Ritalin 10mg   -mood assessment.   5. Dysphagia. Dysphagia 3 thin liquids. Follow per speech therapy. Monitor for any signs of aspiration  6. Seizure prophylaxis. Keppra   7. Hypertension. Hydralazine 20 mg 3 times a day, labetalol 200 mg 3 times a day, Nimodipine 60 mg every 6 hours---taper down seems to have led to some elevation in BP. Discuss with cards regarding further adjustments, convert to long acting CCB  8. Hypernatremia/hypovolemic. ivf stopped. Seems to be maintaining on her own. 9. Hyperglycemia.  Consider resuming low dose amaryl  10. Right ankle pain: gout  -colchicine stopped due to diarrhea 11.FEN: megace trial. 12.Seizure  -keppra 500mg  bid   -activity as tolerated 13. Cards: myoview revealed left wall hypokinesia and an LVEF of 15%  -follow up plan per cardiology   -no activity tolerance issues at present LOS (Days) 22 A FACE TO FACE EVALUATION WAS PERFORMED  SWARTZ,ZACHARY T 09/30/2011, 8:05 AM

## 2011-09-30 NOTE — Progress Notes (Signed)
Occupational Therapy Session Note  Patient Details  Name: KAYLEY LOLLI MRN: [id number] Date of Birth: 1950-06-14  Today's Date: 09/30/2011 Time: 0730-0825 Time Calculation (min): 55 min  Short Term Goals: Week 3:  OT Short Term Goal 1 (Week 3): Pt will perform LB dressing with supervision. OT Short Term Goal 2 (Week 3): Pt will indicate need to use BR to family/staff with Max cues. OT Short Term Goal 3 (Week 3): Pt will maintain dynamic standing balance with supervision. OT Short Term Goal 4 (Week 3): Pt will demonstrate alternating attention during functional task with Mod cues. OT Short Term Goal 5 (Week 3): Pt will be oriented x3 with external aides with Min cues.  Skilled Therapeutic Interventions/Progress Updates:    Pt seen this am for individual OT treatment session with focus on task initiation, sit-stand balance during ADL's, self care and overall bathing and dressing. Pt required Min instructional & verbal cues in minimally distractive environment. Pt benefits from increased time for tasks and problem solving during routine bathe/dressing & encouragement to increase verbalizations for wants/needs. Dynamic standing balance supervision level at sink when one UE supported (ie.when pulling up pants).  Therapy Documentation Precautions:  Precautions Precautions: Fall Precaution Comments:  (foley catheter, rectal tube removed) Restrictions Weight Bearing Restrictions: No     Pain: Pain Assessment Pain Assessment: No/denies pain Pain Score: 0-No pain    :   See FIM for current functional status  Therapy/Group: Individual Therapy  Alm Bustard 09/30/2011, 9:24 AM

## 2011-09-30 NOTE — Progress Notes (Signed)
Stepped in room to do hourly rounds, saw patient sitting in the bathroom floor near the door. Time was 1245. No injuries noted, full assessment unchanged from prior. Patient denies pain. No head injuries observed. Pt was calm. Notified Harvel Ricks, PA at 1250. Vitals taken, BP ranging from 180's-190's systolic initially but trending down. Pulse - 70, Respirations 20, 99% on room air. Pt placed back in wheelchair with safety belt securely fastened behind patient. Will continue to observe patient closely and reassess vitals and assessment in 2 hours. [place], [employer]

## 2011-09-30 NOTE — Progress Notes (Signed)
Pt's foley has not been draining, after evaluation, it is still not draining, pt's husband stated that it has been in for over a month and wanted a new one in. On-coming rn has been informed and will follow up on it with it.  -----Stefannie Defeo, rn

## 2011-09-30 NOTE — Progress Notes (Signed)
Physical Therapy Note  Patient Details  Name: STOREY STANGELAND MRN: [id number] Date of Birth: [birth year] Today's Date: 09/30/2011  Time: 1000-1055 55 minutes  No c/o pain.  Pt found in bed, willing to get up with encouragement.  Treatment focused on gait training without AD.  Pt able to gait 25' x 2 in home environment with min A, steadying assist at hips.  Controlled environment 150'x2 without AD with min A, mod A to correct LOB.  Pt tends to "furniture walk", requires max cuing not to hold to walls, railings when available.  Pt becomes more ataxic in gait pattern when fatigued, requiring mod A for balance.  Pt with episode of incontinence of bowel.  Bathroom mobility and hygiene with supervision-min cues to initiate, pt able to sequence steps with min cuing, increased time.  Pt performed toilet transfers and gait in bathroom, to sink without AD with min A.  Pt requires more frequent rest breaks due to increase energy use of gait without device.  W/c mobility in controlled environment with pt able to self correct steering and perform less than 50' with supervision.  Individual therapy   Riddick Nuon 09/30/2011, 10:53 AM

## 2011-10-01 ENCOUNTER — Inpatient Hospital Stay (HOSPITAL_COMMUNITY): Payer: Medicaid Other | Admitting: Occupational Therapy

## 2011-10-01 ENCOUNTER — Inpatient Hospital Stay (HOSPITAL_COMMUNITY): Payer: Medicaid Other | Admitting: Physical Therapy

## 2011-10-01 LAB — URINE CULTURE: Colony Count: >=100000

## 2011-10-01 LAB — GLUCOSE, CAPILLARY
Glucose-Capillary: 132 mg/dL — ABNORMAL HIGH (ref 70–99)
Glucose-Capillary: 138 mg/dL — ABNORMAL HIGH (ref 70–99)
Glucose-Capillary: 111 mg/dL — ABNORMAL HIGH (ref 70–99)

## 2011-10-01 NOTE — Progress Notes (Signed)
Nursing Note: Called to room by pt's Husband who states that pt is complaining of apin at her foley cathter site since sterile water inflated into bulb by 3-7 pm RN.Assessed pt and pt c/o pain.A: Notified on-call  Dr.Plotnikov and obtained an order to place a new cath.Deflated fully and removed old and placed a new 14 FR cath. Per policy.Pt tolerated well w/ immediate return of clear yellow urine.No change neurologically.wbb

## 2011-10-01 NOTE — Progress Notes (Signed)
Occupational Therapy Session Note  Patient Details  Name: [name] MRN: [id number] Date of Birth: December 11, 1950  Today's Date: 10/01/2011 Time: 1330-1400 Time Calculation (min): 30 min  Short Term Goals: Week 3:  OT Short Term Goal 1 (Week 3): Pt will perform LB dressing with supervision. OT Short Term Goal 2 (Week 3): Pt will indicate need to use BR to family/staff with Max cues. OT Short Term Goal 3 (Week 3): Pt will maintain dynamic standing balance with supervision. OT Short Term Goal 4 (Week 3): Pt will demonstrate alternating attention during functional task with Mod cues. OT Short Term Goal 5 (Week 3): Pt will be oriented x3 with external aides with Min cues.  Skilled Therapeutic Interventions/Progress Updates:    Pt seen for 1:1 OT with focus on problem solving with card activity in standing.  Pt required mod assist for problem solving and recall of rules throughout card activity.  Pt able to sustain attention approx 10 mins in moderately distracting environment, however requiring increased cues for attention as her fatigue increased.  Pt continues to demonstrate decreased selective attention and decreased initiation with simple card activity.  Pt propelled w/c approx 50 feet with mod cues for attention.  Therapy Documentation Precautions:  Precautions Precautions: Fall Precaution Comments:  (foley catheter, rectal tube removed) Restrictions Weight Bearing Restrictions: No General:   Vital Signs: Therapy Vitals Temp: 98.3 F (36.8 C) Temp src: Oral Pulse Rate: 62  Resp: 18  BP: 132/72 mmHg Patient Position, if appropriate: Lying Oxygen Therapy SpO2: 96 % O2 Device: None (Room air) Pain:   Pt with no c/o pain this session.  See FIM for current functional status  Therapy/Group: Individual Therapy  Leonette Monarch 10/01/2011, 2:21 PM

## 2011-10-01 NOTE — Progress Notes (Signed)
Patient ID: [name], female   DOB: Jul 01, 1950, 61 y.o.   MRN: [id number] Subjective/Complaints: Denies pain No complaints  Objective: Vital Signs: Blood pressure 151/83, pulse 75, temperature 98.2 F (36.8 C), temperature source Oral, resp. rate 18, height 5\' 6"  (1.676 m), weight 216 lb 7.9 oz (98.2 kg), SpO2 100.00%.  Physical Exam:   Well-developed well-nourished female in no acute distress. HEENT exam atraumatic, normocephalic, extraocular muscles are intact. Neck is supple. No jugular venous distention no thyromegaly. Chest clear to auscultation without increased work of breathing. Cardiac exam S1 and S2 are regular. Abdominal exam active bowel sounds, soft, nontender. Extremities no edema. Neurologic exam she is alert Assessment/Plan: 1. Functional deficits secondary to SAH/ACA aneurysm with hydrocephalus and VPS which require 3+ hours per day of interdisciplinary therapy in a comprehensive inpatient rehab setting. Physiatrist is providing close team supervision and 24 hour management of active medical problems listed below. Physiatrist and rehab team continue to assess barriers to discharge/monitor patient progress toward functional and medical goals. FIM: Medical Problem List and Plan:  1. subarachnoid hemorrhage with small infarct left internal capsule: Status post IVC placement 08/18/2011 with coiling of aneurysm 08/19/2011 and VP shunt placement 09/02/2011  2. DVT Prophylaxis/Anticoagulation: SCDs. 3. Pain Management: Tylenol for now. Monitor with increased mobility.   4. Neuropsych: This patient is not capable of making decisions on his/her own behalf.  -affect very flat. Ritalin 10mg   -mood assessment.   5. Dysphagia. Dysphagia 3 thin liquids. Follow per speech therapy. Monitor for any signs of aspiration  6. Seizure prophylaxis. Keppra   7. Hypertension. Hydralazine 20 mg 3 times a day, labetalol 200 mg 3 times a day, Nimodipine 60 mg every 6 hours---taper down seems to  have led to some elevation in BP.  BP Readings from Last 3 Encounters:  10/01/11 151/83  09/08/11 123/62  09/08/11 123/62  will continue to follow  8. Hypernatremia/hypovolemic. ivf stopped. Seems to be maintaining on her own. 9. Hyperglycemia. Consider resuming low dose amaryl CBG (last 3)   Basename 09/30/11 2053 09/30/11 1645 09/30/11 1133  GLUCAP 130* 153* 117*      10. Right ankle pain: resolved 11.FEN: megace trial. 12.Seizure  -keppra 500mg  bid   -activity as tolerated 13. Cards: myoview revealed left wall hypokinesia and an LVEF of 15%  -follow up plan per cardiology   -no activity tolerance issues at present LOS (Days) 23 A FACE TO FACE EVALUATION WAS PERFORMED  [name] 10/01/2011, 10:52 AM

## 2011-10-01 NOTE — Progress Notes (Signed)
Physical Therapy Session Note  Patient Details  Name: [name] MRN: [id number] Date of Birth: 1950/05/28  Today's Date: 10/01/2011 Time: 4098-1191 Time Calculation (min): 30 min  Short Term Goals: Week 3:  PT Short Term Goal 1 (Week 3): Pt will perform funcitonal transfers consistently with min A PT Short Term Goal 1 - Progress (Week 3): Met PT Short Term Goal 2 (Week 3): Pt will gait 25' in home environment with min A PT Short Term Goal 2 - Progress (Week 3): Met PT Short Term Goal 3 (Week 3): Pt will propel w/c in controlled environment 53' with supervision PT Short Term Goal 3 - Progress (Week 3): Progressing toward goal  Skilled Therapeutic Interventions/Progress Updates:    Wheelchair parts management and propulsion x 80' with min assist only for obstacle negotiation, moderate verbal cues for attention and awareness of surroundings. Standing balance reaching activities working limits of stability with out UE support, mod verbal cues for task initiation and adherence to instruction in min distracting environment. Sit <> stands required min assist primarily for tactile cues for initiation. Pt repeatedly forgetting to lock one wheelchair break then attempting to stand. Pt bounced ball in standing, min assist for balance mod verbal cues for remembering task and for safety awareness.    Therapy Documentation Precautions:  Precautions Precautions: Fall Precaution Comments:  (foley catheter, rectal tube removed) Restrictions Weight Bearing Restrictions: No Pain: Pain Assessment Pain Assessment: No/denies pain  See FIM for current functional status  Therapy/Group: Individual Therapy  Wilhemina Bonito 10/01/2011, 4:17 PM

## 2011-10-02 ENCOUNTER — Inpatient Hospital Stay (HOSPITAL_COMMUNITY): Payer: Medicaid Other

## 2011-10-02 LAB — GLUCOSE, CAPILLARY
Glucose-Capillary: 287 mg/dL — ABNORMAL HIGH (ref 70–99)
Glucose-Capillary: 208 mg/dL — ABNORMAL HIGH (ref 70–99)

## 2011-10-02 MED ORDER — LISINOPRIL 5 MG PO TABS
5.0000 mg | ORAL_TABLET | Freq: Every day | ORAL | Status: DC
  Administered 2011-10-02 – 2011-10-04 (×3): 5 mg via ORAL
  Filled 2011-10-02 (×5): qty 1

## 2011-10-02 NOTE — Progress Notes (Signed)
Occupational Therapy Session Note  Patient Details  Name: [name] MRN: [id number] Date of Birth: [birth year]  Today's Date: 10/02/2011 Time: 2130-8657 Time Calculation (min): 40 min  Short Term Goals: Week 3:  OT Short Term Goal 1 (Week 3): Pt will perform LB dressing with supervision. OT Short Term Goal 2 (Week 3): Pt will indicate need to use BR to family/staff with Max cues. OT Short Term Goal 3 (Week 3): Pt will maintain dynamic standing balance with supervision. OT Short Term Goal 4 (Week 3): Pt will demonstrate alternating attention during functional task with Mod cues. OT Short Term Goal 5 (Week 3): Pt will be oriented x3 with external aides with Min cues.  Skilled Therapeutic Interventions/Progress Updates:  ADL-retraining at walk-in shower with emphasis on dynamic sitting and standing balance, transfers, bil UE coordination during functional activities, attention, and expressive communication.   Patient completed bathing with supervision to manage hand shower and to adjust water temperature when needed.  Patient was able to verbalize when water temp was too hot or too cold.   Patient demo'd sustained attention to task for > 20 minutes with distractions reduced.  Patient's husband appeared concerned for his wife's safety during transfers but witness patient completing several transfers with only contact guard.     Therapy Documentation Precautions:  Precautions Precautions: Fall Precaution Comments:  (foley catheter, rectal tube removed) Restrictions Weight Bearing Restrictions: No  Pain: No report of pain    See FIM for current functional status  Therapy/Group: Individual Therapy  Georgeanne Nim 10/02/2011, 12:31 PM

## 2011-10-02 NOTE — Progress Notes (Signed)
Unable to get patient to swallow megace, patient eventually spit it out. Alfredo Martinez A

## 2011-10-02 NOTE — Progress Notes (Signed)
Patient ID: [name], female   DOB: 1950/03/05, 61 y.o.   MRN: [id number] Subjective/Complaints:  no specific complaints  Objective: Vital Signs: Blood pressure 178/84, pulse 59, temperature 98.7 F (37.1 C), temperature source Oral, resp. rate 18, height 5\' 6"  (1.676 m), weight 216 lb 0.8 oz (98 kg), SpO2 97.00%.  Physical Exam:   Well-developed well-nourished female in no acute distress. HEENT exam atraumatic, normocephalic, e Chest clear to auscultation without increased work of breathing. Cardiac exam S1 and S2 are regular. Abdominal exam active bowel sounds, soft, nontender. Extremities no edema. Neurologic exam she is alert Assessment/Plan: 1. Functional deficits secondary to SAH/ACA aneurysm with hydrocephalus and VPS which require 3+ hours per day of interdisciplinary therapy in a comprehensive inpatient rehab setting. Physiatrist is providing close team supervision and 24 hour management of active medical problems listed below. Physiatrist and rehab team continue to assess barriers to discharge/monitor patient progress toward functional and medical goals. FIM: Medical Problem List and Plan:  1. subarachnoid hemorrhage with small infarct left internal capsule: Status post IVC placement 08/18/2011 with coiling of aneurysm 08/19/2011 and VP shunt placement 09/02/2011  2. DVT Prophylaxis/Anticoagulation: SCDs. 3. Pain Management: Tylenol for now. Monitor with increased mobility.   4. Neuropsych: This patient is not capable of making decisions on his/her own behalf.  -affect very flat. Ritalin 10mg   -mood assessment.   5. Dysphagia. Dysphagia 3 thin liquids. Follow per speech therapy. Monitor for any signs of aspiration  6. Seizure prophylaxis. Keppra   7. Hypertension.  bp is too high-- add low dose lisinopril 8. Hypernatremia/hypovolemic. ivf stopped. Seems to be maintaining on her own. 9. Hyperglycemia. Consider resuming low dose amaryl CBG (last 3)   Basename 10/02/11  0728 10/01/11 2030 10/01/11 1651  GLUCAP 142* 156* 111*   10. Right ankle pain: resolved 11.FEN: megace trial. 12.Seizure  -keppra 500mg  bid   -activity as tolerated 13. Cards: myoview revealed left wall hypokinesia and an LVEF of 15%  -follow up plan per cardiology   -no activity tolerance issues at present LOS (Days) 24 A FACE TO FACE EVALUATION WAS PERFORMED  [name] 10/02/2011, 10:29 AM

## 2011-10-03 ENCOUNTER — Encounter (HOSPITAL_COMMUNITY): Payer: Medicaid Other | Admitting: Occupational Therapy

## 2011-10-03 ENCOUNTER — Inpatient Hospital Stay (HOSPITAL_COMMUNITY): Payer: Medicaid Other

## 2011-10-03 ENCOUNTER — Inpatient Hospital Stay (HOSPITAL_COMMUNITY): Payer: Medicaid Other | Admitting: Physical Therapy

## 2011-10-03 ENCOUNTER — Inpatient Hospital Stay (HOSPITAL_COMMUNITY): Payer: Medicaid Other | Admitting: Occupational Therapy

## 2011-10-03 LAB — GLUCOSE, CAPILLARY
Glucose-Capillary: 200 mg/dL — ABNORMAL HIGH (ref 70–99)
Glucose-Capillary: 108 mg/dL — ABNORMAL HIGH (ref 70–99)
Glucose-Capillary: 172 mg/dL — ABNORMAL HIGH (ref 70–99)

## 2011-10-03 MED ORDER — NIMODIPINE 60 MG/20ML PO SOLN
60.0000 mg | Freq: Three times a day (TID) | ORAL | Status: DC
  Filled 2011-10-03 (×3): qty 20

## 2011-10-03 NOTE — Progress Notes (Signed)
This note has been reviewed and this clinician agrees with information provided.  

## 2011-10-03 NOTE — Progress Notes (Signed)
Speech Language Pathology Daily Session Note  Patient Details  Name: [name] MRN: [id number] Date of Birth: [birth year]  Today's Date: 10/03/2011 Time: 0930-1015 Time Calculation (min): 45 min  Short Term Goals: Week 4: SLP Short Term Goal 1 (Week 4): Pt will demonstrate sustained attention to a functional task for 10 minutes with Max A verbal cues for redirection SLP Short Term Goal 1 - Progress (Week 4): Progressing toward goal SLP Short Term Goal 2 (Week 4): Pt will orient to place, time, and situation with Max A visual and semantic cues. SLP Short Term Goal 3 (Week 4): Pt will identify 1 physical and 1 cognitive deficit with Max A multimodal cueing SLP Short Term Goal 4 (Week 4): Pt will initiate verbal expression from a  field of two for a functional item with Min A multimodal cueing.  SLP Short Term Goal 4 - Progress (Week 4): Progressing toward goal SLP Short Term Goal 5 (Week 4): Pt will initiate functional tasks with Mod I.  SLP Short Term Goal 5 - Progress (Week 4): Progressing toward goal  Skilled Therapeutic Interventions: Treatment focused on sustained attention, initiation of tasks, and verbal expression. Max verbal cueing required, particularly a choice in f/o 2 and multiple repetition of question, for verbal expression. Noted a mild increase in verbal expression during automatic speech tasks, particularly social greetings, and during a familiar activity (coupon clipping). Sustained attention to familiar task noted for 10 minutes however required max SLP cueing for verbal output during task.    FIM:  Comprehension Comprehension Mode: Auditory Comprehension: 5-Follows basic conversation/direction: With extra time/assistive device Expression Expression Mode: Verbal Expression: 1-Expresses basis less than 25% of the time/requires cueing greater than 75% of the time. Social Interaction Social Interaction: 2-Interacts appropriately 25 - 49% of time - Needs frequent  redirection. Problem Solving Problem Solving: 4-Solves basic 75 - 89% of the time/requires cueing 10 - 24% of the time Memory Memory: 2-Recognizes or recalls 25 - 49% of the time/requires cueing 51 - 75% of the time  Pain Pain Assessment Pain Assessment: No/denies pain  Therapy/Group: Individual Therapy  Ferdinand Lango MA, CCC-SLP [phone]   Ferdinand Lango Meryl 10/03/2011, 12:17 PM

## 2011-10-03 NOTE — Discharge Summary (Signed)
[name], [name] NO.:  [id number]  MEDICAL RECORD NO.:  [id number]  LOCATION:  4030                         FACILITY:  [facility]  PHYSICIAN:  [name], M.D.DATE OF BIRTH:  1950-02-06  DATE OF ADMISSION:  09/08/2011 DATE OF DISCHARGE:  10/04/2011                              DISCHARGE SUMMARY   DISCHARGE DIAGNOSES: 1. Subarachnoid hemorrhage status post IVC placement August 18, 2011. 2. Sequential compression devices for DVT prophylaxis. 3. Dysphagia. 4. Seizure prophylaxis. 5. Hypertension. 6. Hyperglycemia. 7. Right ankle pain with gout.  HISTORY:  This is a 61 year old African American right-handed female with history of hypertension admitted August 18, 2011 with severe onset of headache and became unresponsive.  In the emergency department CT scan imaging of the head showed diffuse subarachnoid hemorrhage as well as small infarct left internal capsule.  Chest x-ray showing aspiration pneumonia.  She subsequently was intubated later extubated after several hours and underwent a right frontal IVC placement on August 18, 2011 per Dr Venetia Maxon.  Cerebral angiogram showed anterior communicating artery aneurysm and underwent coiling of aneurysm on August 19, 2011 per Dr. Abelina Bachelor of Interventional Radiology.  Hospital course with a VP shunt placed September 02, 2011 due to enlarging ventricles and elevated intracranial pressure.  Maintained on Keppra for seizure prophylaxis. Followup prior CT scan due to bouts of lethargy showed VP shunt well position.  No hemorrhage or new brain infarct.  Hypernatremia 148 to 154 are monitored.  Bouts of hyperglycemia and again normalizing. Maintained on a dysphagia one nectar thick-liquid diet per speech therapy.  She was admitted for comprehensive rehab program.  PAST MEDICAL HISTORY:  See discharge diagnoses.  FUNCTIONAL HISTORY:  Prior to admission was independent with assistive device.  Functional status upon  admission to rehab services was +2 total assist for transfers.  PHYSICAL EXAMINATION:  VITAL SIGNS:  Blood pressure 128/61, pulse 59, respirations 18, temperature 98.5. GENERAL:  The patient with flat affect, easily distracted.  She followed inconsistent commands and again easily distracted. LUNGS:  Clear to auscultation. CARDIAC:  Regular rate and rhythm. ABDOMEN:  Soft, nontender.  Good bowel sounds.  REHABILITATION HOSPITAL COURSE:  The patient was admitted to inpatient rehab services with therapies initiated on a 3-hour daily basis consisting of physical therapy, occupational therapy, speech therapy, and rehabilitation nursing.  The following issues were addressed during the patient's rehabilitation stay.  Pertaining to [name]'s subarachnoid hemorrhage with small infarct left internal capsule remained stable.  She had undergone a IVC placement August 18, 2011, with coiling of aneurysm August 19, 2011, and VP shunt placement August 30,2013.  She would follow up with Interventional Radiology as well as Neurosurgery Dr. Venetia Maxon as advised.  Sequential compression devices were in place for DVT prophylaxis.  Her diet was steadily advanced to mechanical soft, thin liquids and no signs of aspiration.  She had remained on Keppra for seizure prophylaxis 500 mg p.o. b.i.d. was noted on September 21, 2011.  The patient with noted seizure.  Followup cranial CT scan showed no evidence of worsening infarct.  No hemorrhage or hydrocephalus.  She remained on her Keppra therapy with no further seizure activity noted.  Ritalin was also added to  her regimen 10 mg b.i.d. and titrated as advised per Dr. Riley Kill.  Her blood pressures remained well controlled and monitored on lisinopril 5 mg daily, labetalol 200 mg 3 times daily.  She had bouts of right ankle pain suspect gout.  She had initially been placed on colchicine this was later discontinued due to bouts of diarrhea and discontinued.   Noted after latest seizure on September 21, 2011 patient with some elevated troponin levels.  Cardiology services was consulted, felt that elevated enzymes were related to seizure.  A Myoview study was completed showing no reversible ischemia or infarction.  The patient received weekly collaborative interdisciplinary team conferences to discuss estimated length of stay, family teaching, and any barriers to her discharge.  She had occasional incontinence of bowel.  A Foley catheter tube was in place.  She was maintaining her current diet of mechanical soft, thin liquids.  She was supervision for upper body, moderate assist lower body, total assist for toileting, minimal assist functional mobility needing max cues for initiation and planning.  The patient with noted limited gains although her attention continued to improve.  She remained on Ritalin therapy.  It was felt skilled nursing facility was needed with bed becoming available on October 04, 2011, and discharge taking place at that time.  DISCHARGE MEDICATIONS: 1. Labetalol 200 mg p.o. t.i.d. 2. Keppra 500 mg p.o. b.i.d. 3. Lisinopril 5 mg p.o. daily. 4. Ritalin 10 mg p.o. b.i.d. at 7:00 a.m. and 12 noon. 5. Protonix 40 mg p.o. daily.  Her diet upgraded to a regular consistency September 29, 2011, and she was tolerating this nicely with again no signs of aspiration.  She would follow up with Dr. Faith Rogue the outpatient rehab service office as Directed on 10/25/11 at 10:10 am.  Dr. Maeola Harman Neurosurgery [phone] call for appointment. Dr. Abelina Bachelor Intervention Radiology in 1 month and Dr. Tresa Endo Cardiology services as needed.     [name], P.A.   ______________________________ [name], M.D.    DA/MEDQ  D:  10/03/2011  T:  10/03/2011  Job:  [id number]  cc:   Nicki Guadalajara, M.D. Danae Orleans. Venetia Maxon, M.D. Patricia Pesa

## 2011-10-03 NOTE — Progress Notes (Signed)
Occupational Therapy Treatment Session Note and Discharge Summary  Patient Details  Name: [name] MRN: [id number] Date of Birth: Jan 19, 1950  Today's Date: 10/03/2011 Time: 4098-1191 Time Calculation (min): 45 min  Patient has met 10 of 10 long term goals due to improved activity tolerance, improved balance, postural control, functional use of  RIGHT upper extremity, improved attention, improved awareness and improved coordination.  Patient to discharge at overall Supervision level for ADL tasks and functional mobility. Pt has made improvements with functional mobility, dynamic sitting/standing balance, activity tolerance, and cognition. Pt continues to require Min cueing assist for sustained and selective attention, initiation, sequencing and organizing routine/familiar tasks, emergent awareness, orientation (time and situation), and functional communication. Pt benefits from bladder/bowel schedule due to decreased verbal expression of need to use the BR and recommended supervision for toilet transfer. Pt to d/c to SNF where necessary assistance will be provided.  Reasons goals not met: NA  Recommendation:  Patient will benefit from ongoing skilled OT services in skilled nursing facility setting to continue to advance functional skills in the area of BADL.  Equipment: No equipment provided; defer to next venue  Reasons for discharge: treatment goals met  Patient/family agrees with progress made and goals achieved: Yes  Treatment Session: Focused on attention and orientation. Pt engaged in making a "memory book" to use as an external aide for increased orientation. Pt required Mod verbal cues for alternating attention and accuracy with questions about orientation to time and situation. Performed toileting task with close supervision for toilet transfer and thoroughness of hygiene tasks. Communicated with nurse tech that pt had voided and is on bladder/bowel schedule.   OT  Discharge Precautions/Restrictions  Precautions Precautions: Fall Restrictions Weight Bearing Restrictions: No Pain Pain Assessment Pain Assessment: No/denies pain ADL  See FIM  Vision/Perception  Vision - History Baseline Vision: No visual deficits Patient Visual Report: No change from baseline Vision - Assessment Eye Alignment: Within Functional Limits Perception Perception: Within Functional Limits Praxis Praxis: Intact  Cognition Overall Cognitive Status: Impaired Arousal/Alertness: Awake/alert Orientation Level: Oriented to person;Oriented to place;Disoriented to time;Disoriented to situation Focused Attention: Appears intact Sustained Attention: Appears intact Memory: Impaired Memory Impairment: Decreased short term memory;Decreased recall of new information Decreased Short Term Memory: Verbal basic;Functional basic Awareness: Impaired Awareness Impairment: Intellectual impairment Problem Solving: Impaired Problem Solving Impairment: Verbal basic;Functional basic Reasoning: Impaired Reasoning Impairment: Verbal basic;Functional basic Sequencing: Appears intact Organizing: Impaired Organizing Impairment: Verbal basic;Functional basic Initiating: Impaired Initiating Impairment: Verbal basic;Functional basic Self Monitoring: Impaired Self Monitoring Impairment: Verbal basic;Functional basic Safety/Judgment: Impaired Sensation Sensation Light Touch: Appears Intact Stereognosis: Appears Intact Hot/Cold: Appears Intact Proprioception: Appears Intact Coordination Gross Motor Movements are Fluid and Coordinated: Yes Fine Motor Movements are Fluid and Coordinated: Yes Motor  Motor Motor: Within Functional Limits Mobility  Bed Mobility Bed Mobility: Supine to Sit;Sitting - Scoot to Edge of Bed Supine to Sit: 6: Modified independent (Device/Increase time) Sitting - Scoot to Edge of Bed: 6: Modified independent (Device/Increase time) Transfers Sit to Stand: 5:  Supervision Sit to Stand Details: Verbal cues for technique Stand to Sit: 5: Supervision  Trunk/Postural Assessment  Cervical Assessment Cervical Assessment: Within Functional Limits Thoracic Assessment Thoracic Assessment: Within Functional Limits Lumbar Assessment Lumbar Assessment: Within Functional Limits Postural Control Postural Control: Within Functional Limits  Balance Static Sitting Balance Static Sitting - Balance Support: No upper extremity supported;Feet supported Static Sitting - Level of Assistance: 6: Modified independent (Device/Increase time) Dynamic Sitting Balance Dynamic Sitting - Balance Support: Feet supported;During  functional activity;No upper extremity supported Dynamic Sitting - Level of Assistance: 6: Modified independent (Device/Increase time) Static Standing Balance Static Standing - Balance Support: During functional activity Static Standing - Level of Assistance: 5: Stand by assistance Dynamic Standing Balance Dynamic Standing - Level of Assistance: 5: Stand by assistance Extremity/Trunk Assessment RUE Assessment RUE Assessment: Within Functional Limits LUE Assessment LUE Assessment: Within Functional Limits  See FIM for current functional status  Jackelyn Poling 10/03/2011, 2:18 PM

## 2011-10-03 NOTE — Progress Notes (Signed)
Physical Therapy Session Note  Patient Details  Name: [name] MRN: [id number] Date of Birth: December 18, 1950  Today's Date: 10/03/2011 Time: 1030-1130 Time Calculation (min): 60 min  Skilled Therapeutic Interventions/Progress Updates:   Pt tends to carry the RW more than use it, R HHA was more challenging for pt than RW and pt able to perform with min@.  During session while pt was walking she was incontinent of bowel and bladder, requiring return to the room to be cleaned during session.   Therapy Documentation Precautions:  Precautions Precautions: Fall Precaution Comments:  (foley catheter, rectal tube removed) Restrictions Weight Bearing Restrictions: No Pain: Pain Assessment Pain Assessment: No/denies pain Mobility: Sit to stand and squat pivots with min@.  Car transfer with min@. Locomotion :  Gait training with RW 200+' on unit with min@, pt picking up RW and carrying it at times.  Gait with HHA x 100' min@ x 2.   Exercises:  NuStep x on workload 4 for strengthening exercise. Other Treatments:   Toileting due to incontinent episode with pt needing min@ for transfers and max@ for hygiene. See FIM for current functional status  Therapy/Group: Individual Therapy  Georges Mouse 10/03/2011, 2:54 PM

## 2011-10-03 NOTE — Discharge Summary (Signed)
  Discharge summary job [phone]

## 2011-10-03 NOTE — Progress Notes (Signed)
Patient ID: MAKILA COLOMBE, female   DOB: 1950-11-07, 61 y.o.   MRN: [id number] Subjective/Complaints: No complaints today. Denies and SOB, CP. Up with OT again this am. A 12 point review of systems has been performed and if not noted above is otherwise negative.  Objective: Vital Signs: Blood pressure 128/61, pulse 64, temperature 98.9 F (37.2 C), temperature source Oral, resp. rate 18, height 5\' 6"  (1.676 m), weight 96.7 kg (213 lb 3 oz), SpO2 97.00%. No results found. No results found for this basename: WBC:2,HGB:2,HCT:2,PLT:2 in the last 72 hours No results found for this basename: NA:2,K:2,CL:2,CO2:2,GLUCOSE:2,BUN:2,CREATININE:2,CALCIUM:2 in the last 72 hours CBG (last 3)   Basename 10/02/11 2111 10/02/11 1639 10/02/11 1148  GLUCAP 208* 287* 133*    Wt Readings from Last 3 Encounters:  10/03/11 96.7 kg (213 lb 3 oz)  09/07/11 98.7 kg (217 lb 9.5 oz)  09/07/11 98.7 kg (217 lb 9.5 oz)    Physical Exam:  HENT: Eyes:  Pupils are reactive to light and  Neck: No thyromegaly present.  Cardiovascular: Normal rate and regular rhythm.  Pulmonary/Chest: Breath sounds normal. No respiratory distress. She has no wheezes.  Abdominal: Bowel sounds are normal. She exhibits no distension. There is no tenderness.  Neurological:  Patient with flat affect.    Opens eyes, speaks a few words. Nods appropriately. Delayed processing but showing some improvement. No-focal findings on exam . Upper extremities have 4 minus grip arm flexion extension.  Right lower extremitytrace active movement although once again not clear whether or not she is fully aware of commands she does have right knee pain with range of motion  Left lower extremity has 3 minus hip knee extensor synergy  Right ankle less tender and warm. Right knee improved Skin intact   Assessment/Plan: 1. Functional deficits secondary to SAH/ACA aneurysm with hydrocephalus and VPS which require 3+ hours per day of interdisciplinary  therapy in a comprehensive inpatient rehab setting. Physiatrist is providing close team supervision and 24 hour management of active medical problems listed below. Physiatrist and rehab team continue to assess barriers to discharge/monitor patient progress toward functional and medical goals. FIM: FIM - Bathing Bathing Steps Patient Completed: Chest;Right Arm;Left Arm;Abdomen;Front perineal area;Buttocks;Right upper leg;Left upper leg Bathing: 4: Min-Patient completes 8-9 32f 10 parts or 75+ percent  FIM - Upper Body Dressing/Undressing Upper body dressing/undressing steps patient completed: Thread/unthread right sleeve of pullover shirt/dresss;Put head through opening of pull over shirt/dress Upper body dressing/undressing: 3: Mod-Patient completed 50-74% of tasks FIM - Lower Body Dressing/Undressing Lower body dressing/undressing steps patient completed: Thread/unthread right pants leg;Thread/unthread left pants leg;Pull pants up/down;Don/Doff right shoe;Don/Doff left shoe;Fasten/unfasten right shoe;Fasten/unfasten left shoe Lower body dressing/undressing: 0: Wears gown/pajamas-no public clothing  FIM - Toileting Toileting steps completed by patient: Adjust clothing prior to toileting;Performs perineal hygiene;Adjust clothing after toileting Toileting: 0: Activity did not occur  FIM - Diplomatic Services operational officer Devices: Grab bars Toilet Transfers: 5-To toilet/BSC: Supervision (verbal cues/safety issues);4-From toilet/BSC: Min A (steadying Pt. > 75%)  FIM - Bed/Chair Transfer Bed/Chair Transfer Assistive Devices: HOB elevated;Bed rails Bed/Chair Transfer: 5: Supine > Sit: Supervision (verbal cues/safety issues);3: Sit > Supine: Mod A (lifting assist/Pt. 50-74%/lift 2 legs);4: Bed > Chair or W/C: Min A (steadying Pt. > 75%);4: Chair or W/C > Bed: Min A (steadying Pt. > 75%)  FIM - Locomotion: Wheelchair Distance: 40 Locomotion: Wheelchair: 2: Travels 50 - 149 ft with  minimal assistance (Pt.>75%) FIM - Locomotion: Ambulation Locomotion: Ambulation Assistive Devices: Designer, industrial/product Ambulation/Gait  Assistance: 4: Min guard Locomotion: Ambulation: 4: Travels 150 ft or more with minimal assistance (Pt.>75%)  Comprehension Comprehension Mode: Auditory Comprehension: 5-Follows basic conversation/direction: With no assist  Expression Expression Mode: Verbal Expression: 2-Expresses basic 25 - 49% of the time/requires cueing 50 - 75% of the time. Uses single words/gestures.  Social Interaction Social Interaction: 4-Interacts appropriately 75 - 89% of the time - Needs redirection for appropriate language or to initiate interaction.  Problem Solving Problem Solving: 3-Solves basic 50 - 74% of the time/requires cueing 25 - 49% of the time  Memory Memory: 3-Recognizes or recalls 50 - 74% of the time/requires cueing 25 - 49% of the time Medical Problem List and Plan:  1. subarachnoid hemorrhage with small infarct left internal capsule: Status post IVC placement 08/18/2011 with coiling of aneurysm 08/19/2011 and VP shunt placement 09/02/2011  2. DVT Prophylaxis/Anticoagulation: SCDs. Monitor for any signs of DVT  3. Pain Management: Tylenol for now. Monitor with increased mobility.   4. Neuropsych: This patient is not capable of making decisions on his/her own behalf.  -affect very flat. Ritalin 10mg   -mood assessment.   5. Dysphagia. Dysphagia 3 thin liquids. Follow per speech therapy. Monitor for any signs of aspiration  6. Seizure prophylaxis. Keppra   7. Hypertension. Hydralazine 20 mg 3 times a day, labetalol 200 mg 3 times a day, Nimodipine 60 mg decrease to q8.  Discuss with cards regarding further adjustments, convert to long acting CCB  8. Hypernatremia/hypovolemic. ivf stopped. Seems to be maintaining on her own. 9. Hyperglycemia. Consider resuming low dose amaryl  10. Right ankle pain: gout  -colchicine stopped due to diarrhea 11.FEN: megace  trial. 12.Seizure  -keppra 500mg  bid   -activity as tolerated 13. Cards: myoview revealed left wall hypokinesia and an LVEF of 15%  -follow up plan per cardiology   -no activity tolerance issues at present LOS (Days) 25 A FACE TO FACE EVALUATION WAS PERFORMED  Jayleene Glaeser T 10/03/2011, 8:30 AM

## 2011-10-03 NOTE — Progress Notes (Signed)
Occupational Therapy Session Note  Patient Details  Name: [name] MRN: [id number] Date of Birth: August 24, 1950  Today's Date: 10/03/2011 Time:  0730- 0815  45 min  Short Term Goals: Week 3:  OT Short Term Goal 1 (Week 3): Pt will perform LB dressing with supervision. OT Short Term Goal 2 (Week 3): Pt will indicate need to use BR to family/staff with Max cues. OT Short Term Goal 3 (Week 3): Pt will maintain dynamic standing balance with supervision. OT Short Term Goal 4 (Week 3): Pt will demonstrate alternating attention during functional task with Mod cues. OT Short Term Goal 5 (Week 3): Pt will be oriented x3 with external aides with Min cues.  Skilled Therapeutic Interventions/Progress Updates:    Focused on expressive communication, functional mobility and standing balance during bathing/dressing tasks. Pt in bed upon arrival to room, performed supine to sit EOB with one instructional cue, Pt denied need to use BR despite Max verbal cues to indicate need and when therapist encouraged going to toilet to try the pt had been incontinent at EOB. Performed toilet transfer and shower transfer using grab bars with supervision. Min verbal cues for organization of bathing and dressing tasks. Ambulated with RW to and from BR with close supervision and Min verbal cues for use of RW. Sit<>stands for clothing management B UE unsupported with close supervision for standing balance.  Therapy Documentation Precautions:  Precautions Precautions: Fall Precaution Comments:  (foley catheter, rectal tube removed) Restrictions Weight Bearing Restrictions: No    Pain:  no c/o pain  See FIM for current functional status  Therapy/Group: Individual Therapy  Jackelyn Poling 10/03/2011, 9:13 AM

## 2011-10-04 DIAGNOSIS — Z5189 Encounter for other specified aftercare: Secondary | ICD-10-CM

## 2011-10-04 DIAGNOSIS — R569 Unspecified convulsions: Secondary | ICD-10-CM

## 2011-10-04 DIAGNOSIS — I609 Nontraumatic subarachnoid hemorrhage, unspecified: Secondary | ICD-10-CM

## 2011-10-04 DIAGNOSIS — E870 Hyperosmolality and hypernatremia: Secondary | ICD-10-CM

## 2011-10-04 MED ORDER — AMLODIPINE BESYLATE 5 MG PO TABS
5.0000 mg | ORAL_TABLET | Freq: Every day | ORAL | Status: DC
  Filled 2011-10-04 (×3): qty 1

## 2011-10-04 NOTE — Plan of Care (Signed)
Problem: RH BLADDER ELIMINATION Goal: RH STG MANAGE BLADDER WITH EQUIPMENT WITH ASSISTANCE STG Manage Bladder With Equipment With mod Assistance  Outcome: Not Met (add Reason) Foley out pt incontinent of bladder using briefs; staff change  Problem: RH SAFETY Goal: RH STG DECREASED RISK OF FALL WITH ASSISTANCE STG Decreased Risk of Fall With min Assistance.  Outcome: Not Met (add Reason) Pt impulsive using QR in chair

## 2011-10-04 NOTE — Progress Notes (Signed)
Patient ID: VELENA KEEGAN, female   DOB: 09-Sep-1950, 61 y.o.   MRN: [id number] Subjective/Complaints: No complaints today.  Slept well last night A 12 point review of systems has been performed and if not noted above is otherwise negative.  Objective: Vital Signs: Blood pressure 180/90, pulse 66, temperature 98.5 F (36.9 C), temperature source Oral, resp. rate 18, height 5\' 6"  (1.676 m), weight 92.5 kg (203 lb 14.8 oz), SpO2 98.00%. No results found. No results found for this basename: WBC:2,HGB:2,HCT:2,PLT:2 in the last 72 hours No results found for this basename: NA:2,K:2,CL:2,CO2:2,GLUCOSE:2,BUN:2,CREATININE:2,CALCIUM:2 in the last 72 hours CBG (last 3)   Basename 10/04/11 0726 10/03/11 2115 10/03/11 1640  GLUCAP 132* 181* 172*    Wt Readings from Last 3 Encounters:  10/04/11 92.5 kg (203 lb 14.8 oz)  09/07/11 98.7 kg (217 lb 9.5 oz)  09/07/11 98.7 kg (217 lb 9.5 oz)    Physical Exam:  HENT: Eyes:  Pupils are reactive to light and  Neck: No thyromegaly present.  Cardiovascular: Normal rate and regular rhythm.  Pulmonary/Chest: Breath sounds normal. No respiratory distress. She has no wheezes.  Abdominal: Bowel sounds are normal. She exhibits no distension. There is no tenderness.  Neurological:  Patient with flat affect.    Opens eyes, speaks a few words. Nods appropriately. Delayed processing but showing some improvement. No-focal findings on exam . Upper extremities have 4 minus grip arm flexion extension.  Right lower extremitytrace active movement although once again not clear whether or not she is fully aware of commands she does have right knee pain with range of motion  Left lower extremity has 3 minus hip knee extensor synergy  Right ankle less tender and warm. Right knee improved Skin intact   Assessment/Plan: 1. Functional deficits secondary to SAH/ACA aneurysm with hydrocephalus and VPS which require 3+ hours per day of interdisciplinary therapy in a  comprehensive inpatient rehab setting. Physiatrist is providing close team supervision and 24 hour management of active medical problems listed below. Physiatrist and rehab team continue to assess barriers to discharge/monitor patient progress toward functional and medical goals. FIM: FIM - Bathing Bathing Steps Patient Completed: Chest;Right Arm;Left Arm;Abdomen;Front perineal area;Buttocks;Right upper leg;Left upper leg;Right lower leg (including foot);Left lower leg (including foot) Bathing: 5: Supervision: Safety issues/verbal cues  FIM - Upper Body Dressing/Undressing Upper body dressing/undressing steps patient completed: Thread/unthread right sleeve of pullover shirt/dresss;Thread/unthread left sleeve of pullover shirt/dress;Put head through opening of pull over shirt/dress;Pull shirt over trunk;Thread/unthread right bra strap;Thread/unthread left bra strap Upper body dressing/undressing: 4: Min-Patient completed 75 plus % of tasks FIM - Lower Body Dressing/Undressing Lower body dressing/undressing steps patient completed: Thread/unthread right pants leg;Thread/unthread left pants leg;Pull pants up/down;Don/Doff right shoe;Don/Doff left shoe;Fasten/unfasten right shoe;Fasten/unfasten left shoe Lower body dressing/undressing: 5: Set-up assist to: Don/Doff TED stocking  FIM - Toileting Toileting steps completed by patient: Adjust clothing prior to toileting;Performs perineal hygiene;Adjust clothing after toileting Toileting: 5: Set-up assist to: Obtain supplies  FIM - Diplomatic Services operational officer Devices: Grab bars Toilet Transfers: 5-To toilet/BSC: Supervision (verbal cues/safety issues);5-From toilet/BSC: Supervision (verbal cues/safety issues)  FIM - Bed/Chair Transfer Bed/Chair Transfer Assistive Devices: Bed rails;Arm rests Bed/Chair Transfer: 4: Supine > Sit: Min A (steadying Pt. > 75%/lift 1 leg);4: Sit > Supine: Min A (steadying pt. > 75%/lift 1 leg);4: Bed >  Chair or W/C: Min A (steadying Pt. > 75%);4: Chair or W/C > Bed: Min A (steadying Pt. > 75%)  FIM - Locomotion: Wheelchair Distance: 40 Locomotion: Wheelchair: 1: Total  Assistance/staff pushes wheelchair (Pt<25%) FIM - Locomotion: Ambulation Locomotion: Ambulation Assistive Devices: Walker - Rolling (or HHA) Ambulation/Gait Assistance: 4: Min guard Locomotion: Ambulation: 4: Travels 150 ft or more with minimal assistance (Pt.>75%)  Comprehension Comprehension Mode: Auditory Comprehension: 3-Understands basic 50 - 74% of the time/requires cueing 25 - 50%  of the time  Expression Expression Mode: Verbal Expression: 2-Expresses basic 25 - 49% of the time/requires cueing 50 - 75% of the time. Uses single words/gestures.  Social Interaction Social Interaction: 3-Interacts appropriately 50 - 74% of the time - May be physically or verbally inappropriate.  Problem Solving Problem Solving: 2-Solves basic 25 - 49% of the time - needs direction more than half the time to initiate, plan or complete simple activities  Memory Memory: 2-Recognizes or recalls 25 - 49% of the time/requires cueing 51 - 75% of the time Medical Problem List and Plan:  1. subarachnoid hemorrhage with small infarct left internal capsule: Status post IVC placement 08/18/2011 with coiling of aneurysm 08/19/2011 and VP shunt placement 09/02/2011  2. DVT Prophylaxis/Anticoagulation: SCDs. Monitor for any signs of DVT  3. Pain Management: Tylenol for now. Monitor with increased mobility.   4. Neuropsych: This patient is not capable of making decisions on his/her own behalf.  -affect very flat. Ritalin 10mg   -mood assessment.   5. Dysphagia. Dysphagia 3 thin liquids. Follow per speech therapy. Monitor for any signs of aspiration  6. Seizure prophylaxis. Keppra   7. Hypertension. Hydralazine 20 mg 3 times a day, labetalol 200 mg 3 times a day  -nimodipine stopped after reviewing with NS and bp has increased as a  result  -initiate norvasc 5mg  daily. Titrate as needed at SNF 8. Hypernatremia/hypovolemic. ivf stopped. Seems to be maintaining on her own. 9. Hyperglycemia. Consider resuming low dose amaryl  10. Right ankle pain: gout  -colchicine stopped due to diarrhea 11.FEN: megace trial. 12.Seizure  -keppra 500mg  bid   -activity as tolerated 13. Cards: myoview revealed left wall hypokinesia and an LVEF of 15%  -no activity tolerance issues at present LOS (Days) 26 A FACE TO FACE EVALUATION WAS PERFORMED  Chayce Robbins T 10/04/2011, 7:45 AM

## 2011-10-04 NOTE — Progress Notes (Signed)
This note has been reviewed and this clinician agrees with information provided.  

## 2011-10-04 NOTE — Progress Notes (Signed)
Discharged to [facility] via ambulance.  Belonging bag sent with patient; flowers x3 at station on hold for spouse pick up. PT aware of discharge and nods head to questions appropriately. Discharge packet given to EMS driver. [name]

## 2011-10-04 NOTE — Progress Notes (Signed)
Speech Language Pathology Discharge Summary  Patient Details  Name: [name] MRN: [id number] Date of Birth: [birth year]  Today's Date: 10/04/2011 Time:  - 1348    Patient has met 5 of 10 long term goals.  Patient to discharge at overall Max level.  Reasons goals not met: Patient continues to require max assist for orientation questions, verbal expression of needs and wants, use of external aids for memory facilitation, mod assit for selective attention, and max assist for intellectual awareness.    Clinical Impression/Discharge Summary: Patient has met functional gains during inpatient rehab admission, meeting 5/10 long term goals. Primary barrier to progress is poor initiation both functionally and verbally impacting communication and carryout of basic ADLs. Patient continues to require max assist for orientation questions, verbal expression of needs and wants, use of external aids for memory facilitation, mod assit for selective attention, and max assist for intellectual awareness.   She wil benefit from SNF f/u to continue to maximize independence with functional ADLs.          Recommendation:  Skilled Nursing facility  Rationale for SLP Follow Up: Maximize functional communication;Maximize cognitive function and independence   Equipment:   none  Reasons for discharge: Discharged from hospital   Patient/Family Agrees with Progress Made and Goals Achieved: Yes   See FIM for current functional status  Ferdinand Lango MA, CCC-SLP [phone]   [name] 10/04/2011, 1:48 PM

## 2011-10-04 NOTE — Progress Notes (Signed)
Social Work  Discharge Note  The overall goal for the admission was met for:   Discharge location: No - plan changed to SNF per family request  Length of Stay: No - delayed due to SNF bed search  Discharge activity level: Yes - minimal assistance, however, family cannot provide this LOC  Home/community participation: Yes  Services provided included: MD, RD, PT, OT, SLP, RN, TR, Pharmacy, Neuropsych and SW  Financial Services: Medicaid  Follow-up services arranged: Other: SNF at [facility] [place]  Comments (or additional information):  Patient/Family verbalized understanding of follow-up arrangements: Yes  Individual responsible for coordination of the follow-up plan: pt's sister  Confirmed correct DME delivered: NA  Eleanora Guinyard

## 2011-10-08 ENCOUNTER — Other Ambulatory Visit (HOSPITAL_COMMUNITY): Payer: Self-pay | Admitting: Internal Medicine

## 2011-10-08 ENCOUNTER — Ambulatory Visit (HOSPITAL_COMMUNITY)
Admission: RE | Admit: 2011-10-08 | Discharge: 2011-10-08 | Disposition: A | Discharge status: Home / Self Care | Payer: Medicaid Other | Source: Ambulatory Visit | Attending: Internal Medicine | Admitting: Internal Medicine

## 2011-10-08 ENCOUNTER — Encounter (HOSPITAL_COMMUNITY): Payer: Self-pay | Admitting: Emergency Medicine

## 2011-10-08 ENCOUNTER — Emergency Department (HOSPITAL_COMMUNITY): Payer: Medicaid Other

## 2011-10-08 ENCOUNTER — Inpatient Hospital Stay (HOSPITAL_COMMUNITY)
Admission: EM | Admit: 2011-10-08 | Discharge: 2011-10-18 | DRG: 689 | Disposition: A | Discharge status: Skilled Nursing Facility | Payer: Medicaid Other | Attending: Internal Medicine | Admitting: Internal Medicine

## 2011-10-08 DIAGNOSIS — I2789 Other specified pulmonary heart diseases: Secondary | ICD-10-CM | POA: Diagnosis present

## 2011-10-08 DIAGNOSIS — N390 Urinary tract infection, site not specified: Principal | ICD-10-CM | POA: Diagnosis present

## 2011-10-08 DIAGNOSIS — F5089 Other specified eating disorder: Secondary | ICD-10-CM | POA: Diagnosis present

## 2011-10-08 DIAGNOSIS — I10 Essential (primary) hypertension: Secondary | ICD-10-CM | POA: Diagnosis present

## 2011-10-08 DIAGNOSIS — R638 Other symptoms and signs concerning food and fluid intake: Secondary | ICD-10-CM | POA: Diagnosis present

## 2011-10-08 DIAGNOSIS — I639 Cerebral infarction, unspecified: Secondary | ICD-10-CM

## 2011-10-08 DIAGNOSIS — Z9181 History of falling: Secondary | ICD-10-CM

## 2011-10-08 DIAGNOSIS — E860 Dehydration: Secondary | ICD-10-CM | POA: Diagnosis present

## 2011-10-08 DIAGNOSIS — I69920 Aphasia following unspecified cerebrovascular disease: Secondary | ICD-10-CM

## 2011-10-08 DIAGNOSIS — E876 Hypokalemia: Secondary | ICD-10-CM | POA: Diagnosis present

## 2011-10-08 DIAGNOSIS — I2699 Other pulmonary embolism without acute cor pulmonale: Secondary | ICD-10-CM | POA: Diagnosis present

## 2011-10-08 DIAGNOSIS — I2782 Chronic pulmonary embolism: Secondary | ICD-10-CM | POA: Diagnosis present

## 2011-10-08 DIAGNOSIS — G40909 Epilepsy, unspecified, not intractable, without status epilepticus: Secondary | ICD-10-CM | POA: Diagnosis present

## 2011-10-08 DIAGNOSIS — G9340 Encephalopathy, unspecified: Secondary | ICD-10-CM

## 2011-10-08 DIAGNOSIS — J690 Pneumonitis due to inhalation of food and vomit: Secondary | ICD-10-CM

## 2011-10-08 DIAGNOSIS — I609 Nontraumatic subarachnoid hemorrhage, unspecified: Secondary | ICD-10-CM | POA: Insufficient documentation

## 2011-10-08 DIAGNOSIS — I69992 Facial weakness following unspecified cerebrovascular disease: Secondary | ICD-10-CM

## 2011-10-08 DIAGNOSIS — E119 Type 2 diabetes mellitus without complications: Secondary | ICD-10-CM | POA: Diagnosis present

## 2011-10-08 DIAGNOSIS — R9431 Abnormal electrocardiogram [ECG] [EKG]: Secondary | ICD-10-CM

## 2011-10-08 DIAGNOSIS — Z982 Presence of cerebrospinal fluid drainage device: Secondary | ICD-10-CM

## 2011-10-08 DIAGNOSIS — I959 Hypotension, unspecified: Secondary | ICD-10-CM | POA: Diagnosis not present

## 2011-10-08 DIAGNOSIS — Z791 Long term (current) use of non-steroidal anti-inflammatories (NSAID): Secondary | ICD-10-CM

## 2011-10-08 DIAGNOSIS — R269 Unspecified abnormalities of gait and mobility: Secondary | ICD-10-CM | POA: Diagnosis present

## 2011-10-08 DIAGNOSIS — E739 Lactose intolerance, unspecified: Secondary | ICD-10-CM | POA: Diagnosis present

## 2011-10-08 DIAGNOSIS — I824Y9 Acute embolism and thrombosis of unspecified deep veins of unspecified proximal lower extremity: Secondary | ICD-10-CM | POA: Diagnosis present

## 2011-10-08 DIAGNOSIS — D72829 Elevated white blood cell count, unspecified: Secondary | ICD-10-CM | POA: Diagnosis present

## 2011-10-08 DIAGNOSIS — N179 Acute kidney failure, unspecified: Secondary | ICD-10-CM | POA: Diagnosis not present

## 2011-10-08 DIAGNOSIS — I6350 Cerebral infarction due to unspecified occlusion or stenosis of unspecified cerebral artery: Secondary | ICD-10-CM | POA: Insufficient documentation

## 2011-10-08 DIAGNOSIS — Z794 Long term (current) use of insulin: Secondary | ICD-10-CM

## 2011-10-08 DIAGNOSIS — R4182 Altered mental status, unspecified: Secondary | ICD-10-CM | POA: Diagnosis present

## 2011-10-08 DIAGNOSIS — I739 Peripheral vascular disease, unspecified: Secondary | ICD-10-CM

## 2011-10-08 DIAGNOSIS — J9600 Acute respiratory failure, unspecified whether with hypoxia or hypercapnia: Secondary | ICD-10-CM

## 2011-10-08 DIAGNOSIS — IMO0001 Reserved for inherently not codable concepts without codable children: Secondary | ICD-10-CM

## 2011-10-08 DIAGNOSIS — R509 Fever, unspecified: Secondary | ICD-10-CM

## 2011-10-08 LAB — CBC WITH DIFFERENTIAL/PLATELET
Band Neutrophils: 0 % (ref 0–10)
Basophils Absolute: 0 10*3/uL (ref 0.0–0.1)
Basophils Relative: 0 % (ref 0–1)
HCT: 35.8 % — ABNORMAL LOW (ref 36.0–46.0)
Hemoglobin: 11.4 g/dL — ABNORMAL LOW (ref 12.0–15.0)
MCH: 26.5 pg (ref 26.0–34.0)
MCHC: 31.8 g/dL (ref 30.0–36.0)
Metamyelocytes Relative: 0 %
Myelocytes: 0 %
Promyelocytes Absolute: 0 %
RDW: 16.9 % — ABNORMAL HIGH (ref 11.5–15.5)

## 2011-10-08 LAB — URINALYSIS, ROUTINE W REFLEX MICROSCOPIC
Bilirubin Urine: NEGATIVE
Ketones, ur: NEGATIVE mg/dL
Specific Gravity, Urine: 1.025 (ref 1.005–1.030)
Urobilinogen, UA: 0.2 mg/dL (ref 0.0–1.0)

## 2011-10-08 LAB — COMPREHENSIVE METABOLIC PANEL
ALT: 29 U/L (ref 0–35)
AST: 42 U/L — ABNORMAL HIGH (ref 0–37)
Alkaline Phosphatase: 129 U/L — ABNORMAL HIGH (ref 39–117)
CO2: 23 mEq/L (ref 19–32)
Calcium: 9.6 mg/dL (ref 8.4–10.5)
Chloride: 101 mEq/L (ref 96–112)
GFR calc non Af Amer: 44 mL/min — ABNORMAL LOW (ref >90)
Potassium: 3.3 mEq/L — ABNORMAL LOW (ref 3.5–5.1)
Sodium: 137 mEq/L (ref 135–145)

## 2011-10-08 LAB — URINE MICROSCOPIC-ADD ON

## 2011-10-08 MED ORDER — INSULIN ASPART 100 UNIT/ML ~~LOC~~ SOLN
0.0000 [IU] | SUBCUTANEOUS | Status: DC
Start: 2011-10-09 — End: 2011-10-15
  Administered 2011-10-09 – 2011-10-10 (×3): 2 [IU] via SUBCUTANEOUS
  Administered 2011-10-10 – 2011-10-14 (×9): 1 [IU] via SUBCUTANEOUS

## 2011-10-08 MED ORDER — CLINDAMYCIN PHOSPHATE 900 MG/50ML IV SOLN
INTRAVENOUS | Status: AC
  Filled 2011-10-08: qty 50

## 2011-10-08 MED ORDER — DEXTROSE 5 % IV SOLN
2.0000 g | Freq: Once | INTRAVENOUS | Status: AC
  Administered 2011-10-08: 2 g via INTRAVENOUS
  Filled 2011-10-08: qty 2

## 2011-10-08 MED ORDER — VANCOMYCIN HCL IN DEXTROSE 1-5 GM/200ML-% IV SOLN
1000.0000 mg | Freq: Once | INTRAVENOUS | Status: AC
  Administered 2011-10-08: 1000 mg via INTRAVENOUS
  Filled 2011-10-08: qty 200

## 2011-10-08 MED ORDER — LEVOFLOXACIN IN D5W 500 MG/100ML IV SOLN: 500.0000 mg | INTRAVENOUS | Status: DC

## 2011-10-08 MED ORDER — ACETAMINOPHEN 650 MG RE SUPP: 650.0000 mg | Freq: Four times a day (QID) | RECTAL | Status: DC | PRN

## 2011-10-08 MED ORDER — SODIUM CHLORIDE 0.9 % IV SOLN
500.0000 mg | Freq: Two times a day (BID) | INTRAVENOUS | Status: DC
  Filled 2011-10-08 (×4): qty 5

## 2011-10-08 MED ORDER — DEXTROSE 5 % IV SOLN
900.0000 mg | Freq: Once | INTRAVENOUS | Status: AC
  Administered 2011-10-08: 900 mg via INTRAVENOUS
  Filled 2011-10-08: qty 6

## 2011-10-08 MED ORDER — DEXTROSE 5 % IV SOLN
INTRAVENOUS | Status: AC
  Administered 2011-10-08
  Filled 2011-10-08: qty 10

## 2011-10-08 MED ORDER — METOPROLOL TARTRATE 1 MG/ML IV SOLN: 5.0000 mg | Freq: Four times a day (QID) | INTRAVENOUS | Status: DC

## 2011-10-08 MED ORDER — IBUPROFEN 800 MG PO TABS
ORAL_TABLET | ORAL | Status: AC
  Filled 2011-10-08: qty 1

## 2011-10-08 MED ORDER — FLEET ENEMA 7-19 GM/118ML RE ENEM: 1.0000 | ENEMA | Freq: Once | RECTAL | Status: AC | PRN

## 2011-10-08 MED ORDER — DEXTROSE 5 % IV SOLN
2.0000 g | Freq: Two times a day (BID) | INTRAVENOUS | Status: DC
  Administered 2011-10-09 – 2011-10-10 (×4): 2 g via INTRAVENOUS
  Filled 2011-10-08 (×6): qty 2

## 2011-10-08 MED ORDER — BISACODYL 10 MG RE SUPP: 10.0000 mg | Freq: Every day | RECTAL | Status: DC | PRN

## 2011-10-08 MED ORDER — ACETAMINOPHEN 325 MG RE SUPP
RECTAL | Status: AC
  Administered 2011-10-08: 650 mg
  Filled 2011-10-08: qty 2

## 2011-10-08 MED ORDER — PIPERACILLIN-TAZOBACTAM 3.375 G IVPB
3.3750 g | Freq: Once | INTRAVENOUS | Status: AC
  Administered 2011-10-08: 3.375 g via INTRAVENOUS
  Filled 2011-10-08: qty 50

## 2011-10-08 MED ORDER — POTASSIUM CHLORIDE IN NACL 20-0.9 MEQ/L-% IV SOLN
INTRAVENOUS | Status: DC
  Administered 2011-10-09 – 2011-10-10 (×4): via INTRAVENOUS
  Filled 2011-10-08 (×10): qty 1000

## 2011-10-08 MED ORDER — SODIUM CHLORIDE 0.9 % IV BOLUS (SEPSIS): 500.0000 mL | Freq: Once | INTRAVENOUS | Status: DC

## 2011-10-08 MED ORDER — SODIUM CHLORIDE 0.9 % IJ SOLN
3.0000 mL | Freq: Two times a day (BID) | INTRAMUSCULAR | Status: DC
  Administered 2011-10-09: 10:00:00 via INTRAVENOUS
  Administered 2011-10-10 – 2011-10-18 (×11): 3 mL via INTRAVENOUS

## 2011-10-08 MED ORDER — SODIUM CHLORIDE 0.9 % IV SOLN
INTRAVENOUS | Status: DC
  Administered 2011-10-08: 20:00:00 via INTRAVENOUS

## 2011-10-08 MED ORDER — HYDRALAZINE HCL 20 MG/ML IJ SOLN: 10.0000 mg | INTRAMUSCULAR | Status: DC | PRN

## 2011-10-08 MED ORDER — PANTOPRAZOLE SODIUM 40 MG IV SOLR
40.0000 mg | INTRAVENOUS | Status: DC
  Administered 2011-10-10 – 2011-10-11 (×3): 40 mg via INTRAVENOUS
  Filled 2011-10-08 (×4): qty 40

## 2011-10-08 MED ORDER — HYDROMORPHONE HCL PF 1 MG/ML IJ SOLN
0.5000 mg | INTRAMUSCULAR | Status: DC | PRN
  Administered 2011-10-10 – 2011-10-17 (×8): 0.5 mg via INTRAVENOUS
  Filled 2011-10-08 (×8): qty 1

## 2011-10-08 NOTE — ED Notes (Signed)
Patient resting at this time.

## 2011-10-08 NOTE — ED Notes (Signed)
Patient is resting comfortably. 

## 2011-10-08 NOTE — H&P (Signed)
[facility] History and Physical  [name]  YQM:[id number]  DOB: 1950/10/31   DOA: 10/08/2011   PCP:   No primary provider on file.  Recently Pearson Grippe MD,  [facility]  Chief Complaint:  Seizures today  HPI: [name] is an 61 y.o. female. African American lady managed by the neurosurgical service in mid August for a subarachnoid hemorrhage with hydrocephalus, required VP shunt placement by Dr. Venetia Maxon, and coiling of anterior communicating artery aneurysm by Dr. Corliss Skains. She received inpatient rehabilitation at [facility] until a few days ago when she was discharged to [facility] facility and [facility]. Patient apparently did have a seizure while she was in inpatient rehabilitation and was started on Keppra.      Family report a change in her status since arriving to [facility] in that she is no longer walking, not eating well, and barely speaks. A she reportedly had an episode of seizure the details of which are unclear but involve clenching of the teeth and foaming at her mouth. She was also noted to have a temperature of 105.6 rectally. At [facility] emergency room she was noted to have a temperature of 103.6, but no clear focus of infection could be identified and hospitalist service was called to assist.  Although patient appears to understand, she mostly does not respond to them although she tracks with her eyes. Family report that she's been breathing more heavily today and reports a cough. There has been no complaints of headache.  During the course of her initial workup at [facility] she was found to be diabetic  Rewiew of Systems:  Unable to obtain due to patient's aphasic posture.   Past Medical History  Diagnosis Date  . Hypertension     Past Surgical History  Procedure Date  . Abdominal hysterectomy   . Ventriculoperitoneal shunt 09/02/2011    Procedure: SHUNT INSERTION VENTRICULAR-PERITONEAL;  Surgeon: Maeola Harman, MD;  Location: [facility];  Service: Neurosurgery;  Laterality: Right;    Medications:  HOME MEDS: Prior to Admission medications   Medication Sig Start Date End Date Taking? Authorizing Provider  docusate sodium (COLACE) 100 MG capsule Take 100 mg by mouth 2 (two) times daily.   Yes Historical Provider, MD  insulin glargine (LANTUS) 100 UNIT/ML injection Inject 4 Units into the skin daily.   Yes Historical Provider, MD  labetalol (NORMODYNE) 200 MG tablet Take 200 mg by mouth 3 (three) times daily.   Yes Historical Provider, MD  levETIRAcetam (KEPPRA) 500 MG tablet Take 500 mg by mouth 2 (two) times daily.   Yes Historical Provider, MD  levofloxacin (LEVAQUIN) 500 MG tablet Take 500 mg by mouth daily. For 7 days (start date 10/08/11)   Yes Historical Provider, MD  lisinopril (PRINIVIL,ZESTRIL) 10 MG tablet Take 10 mg by mouth daily.   Yes Historical Provider, MD  methylphenidate (RITALIN) 10 MG tablet Take 10 mg by mouth 2 (two) times daily.   Yes Historical Provider, MD  pantoprazole (PROTONIX) 40 MG tablet Take 40 mg by mouth daily.   Yes Historical Provider, MD  cloNIDine (CATAPRES) 0.1 MG tablet Take 0.1 mg by mouth every 6 (six) hours as needed. Give if sbp greater than 160    Historical Provider, MD     Allergies:  Allergies  Allergen Reactions  . Lactose Intolerance (Gi) Other (See Comments)    G.I. Upset    Social History:   reports that she has never smoked.  She does not have any smokeless tobacco history on file. She reports that she does not drink alcohol or use illicit drugs.  Family History: No family history on file. Unable to attain  Physical Exam: Filed Vitals:   10/08/11 2000 10/08/11 2004 10/08/11 2159 10/08/11 2232  BP: 123/98 103/69 90/55   Pulse:  110 93   Temp:  103.7 F (39.8 C) 101.1 F (38.4 C) 101.1 F (38.4 C)  TempSrc:  Rectal Rectal   Resp:  22 20   SpO2:  100% 100%    Blood pressure 90/55, pulse 93, temperature 101.1 F (38.4 C),  temperature source Rectal, resp. rate 20, SpO2 100.00%.  GEN:  Obese middle-aged African American lady person lying in the stretcher ;  PSYCH:  Alert, unable to assess orientation; does  appear depressed; affect is flat. HEENT: Mucous membranes pink and anicteric; PERRLA; EOM intact; no cervical lymphadenopathy nor thyromegaly or carotid bruit; no JVD; she has no neck stiffness. Breasts:: Not examined CHEST WALL: No tenderness CHEST: Normal respiration, clear to auscultation bilaterally HEART: Regular rate and rhythm; no murmurs rubs or gallops BACK: No kyphosis or scoliosis; no CVA tenderness ABDOMEN: Obese, soft non-tender; no masses, no organomegaly, normal abdominal bowel sounds; ; no intertriginous candida. Rectal Exam: Not done EXTREMITIES: No bone or joint deformity;; no edema; no ulcerations. Genitalia: not examined PULSES: 2+ and symmetric SKIN: Normal hydration no rash or ulceration CNS: Mild right-sided facial weakness, patient does not smile or open her mouth on comand. She does squeeze my hands on request, and is weaker on the right than on the left. Deep tendon reflexes could not be elicited.   Labs on Admission:  Basic Metabolic Panel:  Lab 10/08/11 1610  NA 137  K 3.3*  CL 101  CO2 23  GLUCOSE 188*  BUN 27*  CREATININE 1.27*  CALCIUM 9.6  MG --  PHOS --   Liver Function Tests:  Lab 10/08/11 1856  AST 42*  ALT 29  ALKPHOS 129*  BILITOT 1.1  PROT 7.5  ALBUMIN 2.7*   No results found for this basename: LIPASE:5,AMYLASE:5 in the last 168 hours No results found for this basename: AMMONIA:5 in the last 168 hours CBC:  Lab 10/08/11 1856  WBC 7.7  NEUTROABS 7.0  HGB 11.4*  HCT 35.8*  MCV 83.1  PLT 92*   Cardiac Enzymes: No results found for this basename: CKTOTAL:5,CKMB:5,CKMBINDEX:5,TROPONINI:5 in the last 168 hours BNP: No components found with this basename: POCBNP:5 D-dimer: No components found with this basename: D-DIMER:5 CBG:  Lab 10/04/11  0726 10/03/11 2115 10/03/11 1640 10/03/11 1142 10/03/11 0902  GLUCAP 132* 181* 172* 108* 200*    Radiological Exams on Admission: Ct Head Wo Contrast  10/08/2011  *RADIOLOGY REPORT*  Clinical Data: Status post embolization of anterior communicating artery aneurysm after subarachnoid hemorrhage.  CT HEAD WITHOUT CONTRAST  Technique:  Contiguous axial images were obtained from the base of the skull through the vertex without contrast.  Comparison: 09/21/2011  Findings: Stable coarse and positioning of ventriculostomy catheter.  No evidence of hydrocephalus or intraventricular blood. Evolving encephalomalacia in the left ACA territory and inferior right frontal lobe without progressive infarction since the prior study.  No evidence of acute hemorrhage, extra-axial fluid collections or mass effect.  The skull is unremarkable.  Aneurysm coils again identified.  IMPRESSION: No acute findings.  Evolving encephalomalacia in the ACA territory of the brain.   Original Report Authenticated By: Reola Calkins, M.D.    Ct Chest Wo  Contrast  10/08/2011  *RADIOLOGY REPORT*  Clinical Data: Fever.  CT CHEST WITHOUT CONTRAST  Technique:  Multidetector CT imaging of the chest was performed following the standard protocol without IV contrast.  Comparison: Chest x-ray earlier today.  Findings: There is cardiomegaly.  Bibasilar atelectasis present. No pleural effusions.  Aorta is tortuous, non-aneurysmal. No mediastinal, hilar, or axillary adenopathy.  Visualized thyroid and chest wall soft tissues unremarkable. Imaging into the upper abdomen shows no acute findings.   No acute bony abnormality.  IMPRESSION: Cardiomegaly, bibasilar atelectasis.   Original Report Authenticated By: Cyndie Chime, M.D.    Dg Chest Portable 1 View  10/08/2011  *RADIOLOGY REPORT*  Clinical Data: Seizure.  Fever.  PORTABLE CHEST - 1 VIEW  Comparison: 09/03/2011  Findings: Artifact overlies chest.  Shunt tube overlies the chest. There is a poor  inspiration.  Basilar atelectasis or infiltrate is not excluded.  Upper lungs are clear.  No bony abnormalities seen.  IMPRESSION: Poor inspiration.  Cannot rule out atelectasis or infiltrate at the lung bases.  Otherwise negative.   Original Report Authenticated By: Thomasenia Sales, M.D.     Assessment/Plan Present on Admission:   .Fever Decreased verbal responsiveness .Seizure disorder,  .SAH (subarachnoid hemorrhage) 08/16/11 .HTN (hypertension), now becoming hypotensive  .Diabetes mellitus, type 2   PLAN: There is no clear cause of this patient's fever and mental status changes, but we need to consider intracranial infection given her recent interventions. Will give broad-spectrum antibiotics after blood and urine cultures and transferred to the step down unit at [facility] on the hospitalist consultations with neurosurgery and neurology.  Her urine does not look definitively infected, but the antibiotics given which should cover urinary tract infections onto the results of urine cultures become available  Continue to give boluses of saline to maintain systolic of 90 or greater; potassium supplementation with IV fluids  Other plans as per orders.  Patient has been discussed with Dr. Phoebe Perch of neurosurgery, and Dr. Herma Ard, neuro hospitalist  Code Status: FULL CODE  Family Communication: ; Plans discussed with patient's daughter and sister who are present at the interview and exam and later with patient's husband Disposition Plan: Transfer to [facility] for further evaluation    Adrianah Prophete Nocturnist [facility] Pager [phone]   10/08/2011, 10:52 PM

## 2011-10-08 NOTE — ED Notes (Signed)
Family is at bedside and waiting to speak with doctor. Family is upset patient has been in the emergency department for so long. RN aware and admitting doctor has been paged. Family was told that the doctor will be down to talk with them shortly.

## 2011-10-08 NOTE — ED Notes (Signed)
Patient had a small bowel movement. Patient was cleaned and put on incontinent chucks. Patient is resting at this time.

## 2011-10-08 NOTE — ED Notes (Signed)
Per ems they got called out for ? Seizure like activity. Pt was sent to [facility] yesterday due to past stroke. Per ems they never seen any seizure activity. Pt temp is 104.4 on arrival. Pt is aphasic at this time and that is her normal.

## 2011-10-08 NOTE — ED Provider Notes (Signed)
History     CSN: [id number]  Arrival date & time 10/08/11  4098   First MD Initiated Contact with Patient 10/08/11 1848      Chief Complaint  Patient presents with  . Seizures    (Consider location/radiation/quality/duration/timing/severity/associated sxs/prior treatment) Patient is a 61 y.o. female presenting with seizures. The history is provided by the patient, the EMS personnel and a relative. The history is limited by the condition of the patient.  Seizures   pt with recent lengthy inpatient stay at [facility] for SAH/hemorrhagic stroke, aneursym, presents from nursing home w fevers. Staff at [facility] questioned possible seizure as pt noted to appear tremulous. No sz activity per ems, no postictal period. ?seizure vs chills/tremulousness. Family reports in hospital at [facility] did have single seizure, is on keppra. Today at [facility] had repeat head ct done - family unsure of results. Pt noted to have fever today 104. Level 5 caveat - pt w expressive aphasia post cva. Family states at [facility] in past couple days, episode(s) emesis after being given meds, ?aspiration risk. Family also notes occasional coughing.      Past Medical History  Diagnosis Date  . Hypertension     Past Surgical History  Procedure Date  . Abdominal hysterectomy   . Ventriculoperitoneal shunt 09/02/2011    Procedure: SHUNT INSERTION VENTRICULAR-PERITONEAL;  Surgeon: Maeola Harman, MD;  Location: [facility];  Service: Neurosurgery;  Laterality: Right;    No family history on file.  History  Substance Use Topics  . Smoking status: Never Smoker   . Smokeless tobacco: Not on file  . Alcohol Use: No    OB History    Grav Para Term Preterm Abortions TAB SAB Ect Mult Living                  Review of Systems  Unable to perform ROS: Other  Neurological: Positive for seizures.  level 5 caveat - not verbally responsive to questions.   Allergies  Lactose intolerance (gi)  Home Medications   Current Outpatient Rx    Name Route Sig Dispense Refill  . CLONIDINE HCL 0.1 MG PO TABS Oral Take 0.1 mg by mouth 2 (two) times daily.    Marland Kitchen HYDROCHLOROTHIAZIDE 25 MG PO TABS Oral Take 1 tablet (25 mg total) by mouth daily. 20 tablet 0  . IBUPROFEN 800 MG PO TABS Oral Take 800 mg by mouth every 8 (eight) hours as needed. Pain    . MORPHINE SULFATE (CONCENTRATE) 20 MG/ML PO SOLN Oral Take 0.5-1 mLs (10-20 mg total) by mouth every 2 (two) hours as needed for pain. 50 mL 0    BP 164/100  Pulse 120  Temp 104.4 F (40.2 C) (Rectal)  Resp 20  SpO2 92%  Physical Exam  Nursing note and vitals reviewed. Constitutional: She appears well-developed and well-nourished. No distress.  HENT:  Head: Atraumatic.  Nose: Nose normal.  Mouth/Throat: Oropharynx is clear and moist.       Shunt site right scalp without sts, erythema or tenderness  Eyes: Conjunctivae normal are normal. Pupils are equal, round, and reactive to light. No scleral icterus.  Neck: Normal range of motion. Neck supple. No tracheal deviation present.       No stiffness or rigidity  Cardiovascular: Regular rhythm, normal heart sounds and intact distal pulses.   No murmur heard. Pulmonary/Chest: Effort normal. No respiratory distress.       Lower lobe rales, esp right  Abdominal: Normal appearance and bowel sounds are normal.  She exhibits no distension and no mass. There is no tenderness. There is no rebound and no guarding.  Genitourinary:       No cva tenderness  Musculoskeletal: She exhibits no edema and no tenderness.  Neurological: She is alert.       Alert, content. Aphasic. Equal grip. Inconsistent following of commands. Does move bil extremities purposefully.   Skin: Skin is warm and dry. No rash noted.    ED Course  Procedures (including critical care time)  Results for orders placed during the hospital encounter of 10/08/11  CBC WITH DIFFERENTIAL      Component Value Range   WBC 7.7  4.0 - 10.5 K/uL   RBC 4.31  3.87 - 5.11 MIL/uL    Hemoglobin 11.4 (*) 12.0 - 15.0 g/dL   HCT 16.1 (*) 09.6 - 04.5 %   MCV 83.1  78.0 - 100.0 fL   MCH 26.5  26.0 - 34.0 pg   MCHC 31.8  30.0 - 36.0 g/dL   RDW 40.9 (*) 81.1 - 91.4 %   Platelets 92 (*) 150 - 400 K/uL   Neutrophils Relative 91 (*) 43 - 77 %   Lymphocytes Relative 7 (*) 12 - 46 %   Monocytes Relative 2 (*) 3 - 12 %   Eosinophils Relative 0  0 - 5 %   Basophils Relative 0  0 - 1 %   Band Neutrophils 0  0 - 10 %   Metamyelocytes Relative 0     Myelocytes 0     Promyelocytes Absolute 0     Blasts 0     nRBC 0  0 /100 WBC   Neutro Abs 7.0  1.7 - 7.7 K/uL   Lymphs Abs 0.5 (*) 0.7 - 4.0 K/uL   Monocytes Absolute 0.2  0.1 - 1.0 K/uL   Eosinophils Absolute 0.0  0.0 - 0.7 K/uL   Basophils Absolute 0.0  0.0 - 0.1 K/uL  COMPREHENSIVE METABOLIC PANEL      Component Value Range   Sodium 137  135 - 145 mEq/L   Potassium 3.3 (*) 3.5 - 5.1 mEq/L   Chloride 101  96 - 112 mEq/L   CO2 23  19 - 32 mEq/L   Glucose, Bld 188 (*) 70 - 99 mg/dL   BUN 27 (*) 6 - 23 mg/dL   Creatinine, Ser 7.82 (*) 0.50 - 1.10 mg/dL   Calcium 9.6  8.4 - 95.6 mg/dL   Total Protein 7.5  6.0 - 8.3 g/dL   Albumin 2.7 (*) 3.5 - 5.2 g/dL   AST 42 (*) 0 - 37 U/L   ALT 29  0 - 35 U/L   Alkaline Phosphatase 129 (*) 39 - 117 U/L   Total Bilirubin 1.1  0.3 - 1.2 mg/dL   GFR calc non Af Amer 44 (*) >90 mL/min   GFR calc Af Amer 51 (*) >90 mL/min  URINALYSIS, ROUTINE W REFLEX MICROSCOPIC      Component Value Range   Color, Urine YELLOW  YELLOW   APPearance HAZY (*) CLEAR   Specific Gravity, Urine 1.025  1.005 - 1.030   pH 6.5  5.0 - 8.0   Glucose, UA NEGATIVE  NEGATIVE mg/dL   Hgb urine dipstick LARGE (*) NEGATIVE   Bilirubin Urine NEGATIVE  NEGATIVE   Ketones, ur NEGATIVE  NEGATIVE mg/dL   Protein, ur 213 (*) NEGATIVE mg/dL   Urobilinogen, UA 0.2  0.0 - 1.0 mg/dL   Nitrite NEGATIVE  NEGATIVE  Leukocytes, UA NEGATIVE  NEGATIVE  LACTIC ACID, PLASMA      Component Value Range   Lactic Acid, Venous 2.0   0.5 - 2.2 mmol/L  CULTURE, BLOOD (ROUTINE X 2)      Component Value Range   Specimen Description LEFT ANTECUBITAL     Special Requests BOTTLES DRAWN AEROBIC AND ANAEROBIC 6CC     Culture PENDING     Report Status PENDING    CULTURE, BLOOD (ROUTINE X 2)      Component Value Range   Specimen Description BLOOD LEFT HAND     Special Requests BOTTLES DRAWN AEROBIC AND ANAEROBIC 6CC     Culture PENDING     Report Status PENDING    URINE MICROSCOPIC-ADD ON      Component Value Range   Squamous Epithelial / LPF FEW (*) RARE   WBC, UA 11-20  <3 WBC/hpf   RBC / HPF 11-20  <3 RBC/hpf   Bacteria, UA MANY (*) RARE   Casts GRANULAR CAST (*) NEGATIVE   Ct Head Wo Contrast  10/08/2011  *RADIOLOGY REPORT*  Clinical Data: Status post embolization of anterior communicating artery aneurysm after subarachnoid hemorrhage.  CT HEAD WITHOUT CONTRAST  Technique:  Contiguous axial images were obtained from the base of the skull through the vertex without contrast.  Comparison: 09/21/2011  Findings: Stable coarse and positioning of ventriculostomy catheter.  No evidence of hydrocephalus or intraventricular blood. Evolving encephalomalacia in the left ACA territory and inferior right frontal lobe without progressive infarction since the prior study.  No evidence of acute hemorrhage, extra-axial fluid collections or mass effect.  The skull is unremarkable.  Aneurysm coils again identified.  IMPRESSION: No acute findings.  Evolving encephalomalacia in the ACA territory of the brain.   Original Report Authenticated By: Reola Calkins, M.D.    Ct Head Wo Contrast  09/21/2011  *RADIOLOGY REPORT*  Clinical Data: Seizure  CT HEAD WITHOUT CONTRAST  Technique:  Contiguous axial images were obtained from the base of the skull through the vertex without contrast.  Comparison: 09/12/2011  Findings: There is a right frontal ventriculostomy catheter with tip terminating in the region of the third ventricle.  The ventricular  volumes are within normal limits.  Subacute infarct involving bilateral frontal lobes, left corpus callosum and left basal ganglia are identified.  The appearance is unchanged from previous examination.  No evidence for acute infarct or intracranial hemorrhage. Aneurysm coil mass is unchanged. The mastoid air cells and the paranasal sinuses are clear.  The skull appears intact.  IMPRESSION:  1.  Stable exam.  No evidence for worsening infarction, new hemorrhage or hydrocephalus.   Original Report Authenticated By: Rosealee Albee, M.D.    Ct Head Wo Contrast  09/12/2011  *RADIOLOGY REPORT*  Clinical Data: Increasing lethargy.  Previous subarachnoid hemorrhage with hydrocephalus and vasospasm.  CT HEAD WITHOUT CONTRAST  Technique:  Contiguous axial images were obtained from the base of the skull through the vertex without contrast.  Comparison: Multiple priors, most recent 09/07/2011  Findings: Subacute areas of ischemia/infarction affect both frontal lobes, left corpus callosum, and left basal ganglia.  No new areas of infarction are observed.  No new hemorrhage.  Ventricular catheter position is unchanged, from right frontal approach with tip in the third ventricle slightly crossing the midline to the left.  No evidence for hydrocephalus.  No significant extra-axial fluid collection.  Calvarium intact other than the right frontal burr hole.  Aneurysm coil mass unchanged.  No acute sinus  or mastoid disease.  IMPRESSION: Stable exam.  No evidence for worsening infarction, new hemorrhage, or hydrocephalus.   Original Report Authenticated By: Elsie Stain, M.D.    Nm Myocar Multi W/spect W/wall Motion / Ef  09/26/2011  *RADIOLOGY REPORT*  Clinical Data:  61 year old female with coronary artery disease. The the a the  MYOCARDIAL IMAGING WITH SPECT (REST AND PHARMACOLOGIC-STRESS) GATED LEFT VENTRICULAR WALL MOTION STUDY LEFT VENTRICULAR EJECTION FRACTION  Technique:  Resting myocardial SPECT imaging was initially  performed after intravenous administration of radiopharmaceutical. Myocardial SPECT was subsequently performed after additional radiopharmaceutical injection during pharmacologic-stress supervised by the Cardiology staff.  Quantitative gated imaging was also performed to evaluate left ventricular wall motion, and estimate left ventricular ejection fraction.  Radiopharmaceutical:  Tc-39m Myoview at rest and during stress.  Comparison: none  Findings:  Technique: Study is adequate.  Perfusion:  There are no relative decreased counts on stress or rest to suggest reversible ischemia or infarction.  Wall motion:   Hypokinesia of the lateral wall.  Left ventricular ejection fraction:  Calculated left ventricular ejection fraction =  15%  IMPRESSION:  1. No reversible ischemia or infarction. 2.  Lateral wall hypokinesia.  3.  Left ventricular ejection fraction equal 15%   Original Report Authenticated By: Genevive Bi, M.D.    Dg Chest Portable 1 View  10/08/2011  *RADIOLOGY REPORT*  Clinical Data: Seizure.  Fever.  PORTABLE CHEST - 1 VIEW  Comparison: 09/03/2011  Findings: Artifact overlies chest.  Shunt tube overlies the chest. There is a poor inspiration.  Basilar atelectasis or infiltrate is not excluded.  Upper lungs are clear.  No bony abnormalities seen.  IMPRESSION: Poor inspiration.  Cannot rule out atelectasis or infiltrate at the lung bases.  Otherwise negative.   Original Report Authenticated By: Thomasenia Sales, M.D.    Dg Swallowing Func-speech Pathology  09/21/2011  FLUOROSCOPY FOR SWALLOWING FUNCTION STUDY  Fluoroscopy was provided for swallowing function study, which was a dministered by a speech pathologist.  Final results and recommendat ions from this study are contained within the speech pathology repo rt.   Original Report Authenticated By: Sharyn Lull         MDM   Iv ns. Labs. Cultures. Cxr.   Reviewed nursing notes and prior charts for additional history.   Fever,  recent cough, recent episode(s) emesis ?aspiration. ?infiltrate on cxr, exam findings. Will rx hcap, will add clinda re possible aspiration pna.   Pt also w ?uti on labs, cx sent, already covered w abx given in ed.  [facility], Dr Orvan Falconer, including family request for admit to [facility]. He will see in ed and facilitate transfer to his team/colleagues at [facility].        Suzi Roots, MD 10/08/11 [phone]

## 2011-10-09 ENCOUNTER — Encounter (HOSPITAL_COMMUNITY): Payer: Self-pay | Admitting: *Deleted

## 2011-10-09 DIAGNOSIS — I2699 Other pulmonary embolism without acute cor pulmonale: Secondary | ICD-10-CM

## 2011-10-09 DIAGNOSIS — R6889 Other general symptoms and signs: Secondary | ICD-10-CM

## 2011-10-09 LAB — GLUCOSE, CAPILLARY: Glucose-Capillary: 94 mg/dL (ref 70–99)

## 2011-10-09 LAB — TSH: TSH: 2.034 u[IU]/mL (ref 0.350–4.500)

## 2011-10-09 LAB — MRSA PCR SCREENING: MRSA by PCR: NEGATIVE

## 2011-10-09 MED ORDER — VANCOMYCIN HCL IN DEXTROSE 1-5 GM/200ML-% IV SOLN
1000.0000 mg | Freq: Two times a day (BID) | INTRAVENOUS | Status: DC
  Administered 2011-10-09 – 2011-10-11 (×5): 1000 mg via INTRAVENOUS
  Filled 2011-10-09 (×7): qty 200

## 2011-10-09 MED ORDER — CHLORHEXIDINE GLUCONATE 0.12 % MT SOLN
15.0000 mL | Freq: Two times a day (BID) | OROMUCOSAL | Status: DC
  Administered 2011-10-10 – 2011-10-15 (×9): 15 mL via OROMUCOSAL
  Filled 2011-10-09 (×13): qty 15

## 2011-10-09 MED ORDER — LEVOFLOXACIN IN D5W 500 MG/100ML IV SOLN
500.0000 mg | Freq: Every day | INTRAVENOUS | Status: DC
  Administered 2011-10-09 – 2011-10-10 (×3): 500 mg via INTRAVENOUS
  Filled 2011-10-09 (×4): qty 100

## 2011-10-09 MED ORDER — SODIUM CHLORIDE 0.9 % IV SOLN
500.0000 mg | Freq: Two times a day (BID) | INTRAVENOUS | Status: DC
  Administered 2011-10-09 – 2011-10-12 (×8): 500 mg via INTRAVENOUS
  Filled 2011-10-09 (×9): qty 5

## 2011-10-09 MED ORDER — BIOTENE DRY MOUTH MT LIQD
15.0000 mL | Freq: Two times a day (BID) | OROMUCOSAL | Status: DC
  Administered 2011-10-09 – 2011-10-15 (×9): 15 mL via OROMUCOSAL

## 2011-10-09 MED ORDER — SODIUM CHLORIDE 0.9 % IV BOLUS (SEPSIS): 1000.0000 mL | Freq: Once | INTRAVENOUS | Status: DC

## 2011-10-09 MED ORDER — SODIUM CHLORIDE 0.9 % IV BOLUS (SEPSIS)
500.0000 mL | Freq: Once | INTRAVENOUS | Status: AC
  Administered 2011-10-09: 500 mL via INTRAVENOUS

## 2011-10-09 NOTE — Progress Notes (Signed)
[facility] transferred from [facility] for seizure, AMS , recent SAH  Received 2L fluid bolus in [facility] ED BP 109 SBP upon arrival More alert  Will give another bolus, continue broad spectrum abx  Dr Laverda Sorenson notified about patients arrival

## 2011-10-09 NOTE — Progress Notes (Signed)
Physical Therapy Evaluation Patient Details Name: [name] MRN: [id number] DOB: Apr 10, 1950 Today's Date: 10/09/2011 Time: 7829-5621 PT Time Calculation (min): 19 min  PT Assessment / Plan / Recommendation Clinical Impression  Patient is a 61 yo female admitted from [facility] with fever, seizures.  Recently discharged from [facility] following SAH, hydrocephalus, VP shunt.  Patient somewhat lethargic during session.  Able to answer questions with short 1-2 word answers.  Patient with cognitive and mobility deficits.  Will benefit from acute PT for mobility training.  Recommend return to SNF for continued therapy at discharge.    PT Assessment  Patient needs continued PT services    Follow Up Recommendations  Post acute inpatient    Does the patient have the potential to tolerate intense rehabilitation   No, Recommend SNF  Barriers to Discharge Decreased caregiver support      Equipment Recommendations  None recommended by PT    Recommendations for Other Services     Frequency Min 3X/week    Precautions / Restrictions Precautions Precautions: Fall Restrictions Weight Bearing Restrictions: No         Mobility  Bed Mobility Bed Mobility: Rolling Left;Left Sidelying to Sit;Sit to Supine Rolling Left: 4: Min guard;With rail Left Sidelying to Sit: 4: Min assist;With rails;HOB flat Sit to Supine: 4: Min assist;With rail;HOB flat Details for Bed Mobility Assistance: Verbal and tactile cues for technique.  Assist to raise trunk off bed for sitting.  Assist to bring LE's onto bed to return to supine. Transfers Transfers: Sit to Stand;Stand to Sit Sit to Stand: 4: Min assist;With upper extremity assist;From bed Stand to Sit: 4: Min assist;With upper extremity assist;To bed Details for Transfer Assistance: Verbal cues for hand placement, and to wait until lines are prepared before standing. Ambulation/Gait Ambulation/Gait Assistance: 4: Min assist (and +1 lines) Ambulation  Distance (Feet): 84 Feet Assistive device: Rolling walker Ambulation/Gait Assistance Details: Patient required assist to maneuver RW - running into wall and objects in hallway.  Gait unsteady, with decreased control of LE's.   Gait Pattern: Step-through pattern;Decreased stride length (Decreased control of LE's in stance and swing phases) Gait velocity: Slow gait speed           PT Diagnosis: Difficulty walking;Abnormality of gait;Generalized weakness;Altered mental status  PT Problem List: Decreased strength;Decreased activity tolerance;Decreased balance;Decreased mobility;Decreased cognition;Decreased knowledge of use of DME;Decreased safety awareness PT Treatment Interventions: DME instruction;Gait training;Functional mobility training;Therapeutic activities;Balance training;Patient/family education   PT Goals Acute Rehab PT Goals PT Goal Formulation: With patient/family Time For Goal Achievement: 10/16/11 Potential to Achieve Goals: Good Pt will go Sit to Stand: with supervision PT Goal: Sit to Stand - Progress: Goal set today Pt will go Stand to Sit: with supervision PT Goal: Stand to Sit - Progress: Goal set today Pt will Transfer Bed to Chair/Chair to Bed: with supervision PT Transfer Goal: Bed to Chair/Chair to Bed - Progress: Goal set today Pt will Ambulate: >150 feet;with supervision;with rolling walker PT Goal: Ambulate - Progress: Goal set today  Visit Information  Last PT Received On: 10/09/11 Assistance Needed: +2    Subjective Data  Subjective: "I'm doing OK"  "[facility]" Patient Stated Goal: None stated   Prior Functioning  Home Living Lives With: Other (Comment) (From [facility]) Available Help at Discharge: Skilled Nursing Facility Prior Function Level of Independence: Needs assistance Needs Assistance: Bathing;Dressing;Meal Prep;Light Housekeeping;Gait Bath: Moderate Dressing: Moderate Meal Prep: Total Light Housekeeping: Total Gait Assistance: Min assist  with RW at d/c from CIR  Able to Take Stairs?: No Driving: No Communication Communication:  (Brief 1-2 word responses.) Dominant Hand: Right    Cognition  Overall Cognitive Status: History of cognitive impairments - at baseline Area of Impairment: Attention;Memory;Following commands;Safety/judgement;Awareness of deficits;Problem solving Arousal/Alertness: Lethargic Orientation Level: Disoriented to;Time;Situation Behavior During Session: Lethargic Current Attention Level: Sustained Memory Deficits: Unable to recall recent events, SNF admit, etc. Following Commands: Follows one step commands inconsistently;Follows one step commands with increased time Safety/Judgement: Decreased safety judgement for tasks assessed;Impulsive;Decreased awareness of need for assistance Awareness of Deficits: Unable to state why she is in hospital.    Extremity/Trunk Assessment Right Upper Extremity Assessment RUE ROM/Strength/Tone: [facility] for tasks assessed Left Upper Extremity Assessment LUE ROM/Strength/Tone: [facility] for tasks assessed Right Lower Extremity Assessment RLE ROM/Strength/Tone: Deficits RLE ROM/Strength/Tone Deficits: General weakness - 4/5 Left Lower Extremity Assessment LLE ROM/Strength/Tone: Deficits LLE ROM/Strength/Tone Deficits: General weakness - 4/5   Balance Balance Balance Assessed: Yes Static Sitting Balance Static Sitting - Balance Support: No upper extremity supported;Feet supported Static Sitting - Level of Assistance: 5: Stand by assistance Static Sitting - Comment/# of Minutes: Patient able to maintain upright posture/balance x 3 minutes.  End of Session PT - End of Session Equipment Utilized During Treatment: Gait belt Activity Tolerance: Patient limited by fatigue Patient left: in bed;with call bell/phone within reach;with nursing in room;with family/visitor present Nurse Communication: Mobility status  GP     Vena Austria 10/09/2011, 1:28 PM Durenda Hurt. Renaldo Fiddler,  [facility] Acute Rehab Services Pager [phone]

## 2011-10-09 NOTE — Progress Notes (Addendum)
[facility] PROGRESS NOTE  [name] AVW:[id number] DOB: 18-May-1950 DOA: 10/08/2011 PCP: No primary provider on file.  Assessment/Plan: 1. H/o subarachnoid hemorrhage with hydrocephalus: Dr. Phoebe Perch on board and will follow up with his recommendations.  CT of head on 10/08/11 reported no acute findings.  Did report however evolving encephalomalacia in the ACA territory.  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 Corliss Skains on 08/19/11. 2. Seizure d/o: Recommendations per neurology.  Dr. Otelia Limes on board and we will follow up with their recommendations as well 3. UTI: Continue broad spectrum antibiotics until urine culture results are back then we will narrow antibiotic coverage appropriately.  U/A on 10/05 showed hazy urine with large hgb and many bacteria. 4. DM: Blood sugars relatively well controlled at this point.  Will continue SSI and once diet is able to be advanced would start patient on diabetic diet.    Code Status: Full Disposition Plan: Pending improvement in clinical condition and cessation of fevers.     Consultants:  Neurosurgery: Dr. Phoebe Perch  Neurology: Dr. Otelia Limes  Procedures:  None  Antibiotics:  Levaquin started 10/5  Vancomycin started 10/5  ceftriaxone started 10/5  HPI/Subjective: No new complaints today.  No new seizure like activity reported by family member.  Objective: Filed Vitals:   10/09/11 0600 10/09/11 0700 10/09/11 0800 10/09/11 0818  BP: 92/53 150/84 146/79 146/49  Pulse: 58 59 55 56  Temp:    97.4 F (36.3 C)  TempSrc:    Axillary  Resp: 17 15 19 17   Height:      Weight:      SpO2: 100% 100% 99% 99%    Intake/Output Summary (Last 24 hours) at 10/09/11 0935 Last data filed at 10/09/11 0819  Gross per 24 hour  Intake 928.33 ml  Output    285 ml  Net 643.33 ml   Filed Weights   10/09/11 0127  Weight: 95.5 kg (210 lb 8.6 oz)    Exam:   General:  Pt in NAD, resting supine in bed  Cardiovascular: RRR, No  MRG  Respiratory: CTA BL, no increased work of breathing  Abdomen: soft, nt, nd  Data Reviewed: Basic Metabolic Panel:  Lab 10/08/11 8295  NA 137  K 3.3*  CL 101  CO2 23  GLUCOSE 188*  BUN 27*  CREATININE 1.27*  CALCIUM 9.6  MG --  PHOS --   Liver Function Tests:  Lab 10/08/11 1856  AST 42*  ALT 29  ALKPHOS 129*  BILITOT 1.1  PROT 7.5  ALBUMIN 2.7*   No results found for this basename: LIPASE:5,AMYLASE:5 in the last 168 hours No results found for this basename: AMMONIA:5 in the last 168 hours CBC:  Lab 10/08/11 1856  WBC 7.7  NEUTROABS 7.0  HGB 11.4*  HCT 35.8*  MCV 83.1  PLT 92*   Cardiac Enzymes: No results found for this basename: CKTOTAL:5,CKMB:5,CKMBINDEX:5,TROPONINI:5 in the last 168 hours BNP (last 3 results) No results found for this basename: PROBNP:3 in the last 8760 hours CBG:  Lab 10/09/11 0816 10/09/11 0136 10/04/11 0726 10/03/11 2115 10/03/11 1640  GLUCAP 174* 194* 132* 181* 172*    Recent Results (from the past 240 hour(s))  URINE CULTURE     Status: Normal   Collection Time   09/30/11  8:17 PM      Component Value Range Status Comment   Specimen Description URINE, CATHETERIZED   Final    Special Requests NONE   Final    Culture  Setup Time 09/30/2011 20:49   Final    Colony Count >=100,000 COLONIES/ML   Final    Culture     Final    Value: Multiple bacterial morphotypes present, none predominant. Suggest appropriate recollection if clinically indicated.   Report Status 10/01/2011 FINAL   Final   CULTURE, BLOOD (ROUTINE X 2)     Status: Normal (Preliminary result)   Collection Time   10/08/11  6:56 PM      Component Value Range Status Comment   Specimen Description LEFT ANTECUBITAL   Final    Special Requests BOTTLES DRAWN AEROBIC AND ANAEROBIC 6CC   Final    Culture PENDING   Incomplete    Report Status PENDING   Incomplete   CULTURE, BLOOD (ROUTINE X 2)     Status: Normal (Preliminary result)   Collection Time   10/08/11  7:15  PM      Component Value Range Status Comment   Specimen Description BLOOD LEFT HAND   Final    Special Requests BOTTLES DRAWN AEROBIC AND ANAEROBIC 6CC   Final    Culture PENDING   Incomplete    Report Status PENDING   Incomplete   MRSA PCR SCREENING     Status: Normal   Collection Time   10/09/11  1:55 AM      Component Value Range Status Comment   MRSA by PCR NEGATIVE  NEGATIVE Final      Studies: Ct Head Wo Contrast  10/08/2011  *RADIOLOGY REPORT*  Clinical Data: Status post embolization of anterior communicating artery aneurysm after subarachnoid hemorrhage.  CT HEAD WITHOUT CONTRAST  Technique:  Contiguous axial images were obtained from the base of the skull through the vertex without contrast.  Comparison: 09/21/2011  Findings: Stable coarse and positioning of ventriculostomy catheter.  No evidence of hydrocephalus or intraventricular blood. Evolving encephalomalacia in the left ACA territory and inferior right frontal lobe without progressive infarction since the prior study.  No evidence of acute hemorrhage, extra-axial fluid collections or mass effect.  The skull is unremarkable.  Aneurysm coils again identified.  IMPRESSION: No acute findings.  Evolving encephalomalacia in the ACA territory of the brain.   Original Report Authenticated By: Reola Calkins, M.D.    Ct Chest Wo Contrast  10/08/2011  *RADIOLOGY REPORT*  Clinical Data: Fever.  CT CHEST WITHOUT CONTRAST  Technique:  Multidetector CT imaging of the chest was performed following the standard protocol without IV contrast.  Comparison: Chest x-ray earlier today.  Findings: There is cardiomegaly.  Bibasilar atelectasis present. No pleural effusions.  Aorta is tortuous, non-aneurysmal. No mediastinal, hilar, or axillary adenopathy.  Visualized thyroid and chest wall soft tissues unremarkable. Imaging into the upper abdomen shows no acute findings.   No acute bony abnormality.  IMPRESSION: Cardiomegaly, bibasilar atelectasis.    Original Report Authenticated By: Cyndie Chime, M.D.    Dg Chest Portable 1 View  10/08/2011  *RADIOLOGY REPORT*  Clinical Data: Seizure.  Fever.  PORTABLE CHEST - 1 VIEW  Comparison: 09/03/2011  Findings: Artifact overlies chest.  Shunt tube overlies the chest. There is a poor inspiration.  Basilar atelectasis or infiltrate is not excluded.  Upper lungs are clear.  No bony abnormalities seen.  IMPRESSION: Poor inspiration.  Cannot rule out atelectasis or infiltrate at the lung bases.  Otherwise negative.   Original Report Authenticated By: Thomasenia Sales, M.D.     Scheduled Meds:   . acetaminophen      . cefTRIAXone (ROCEPHIN)  IV  2 g Intravenous BID  . cefTRIAXone (ROCEPHIN)  IV  2 g Intravenous Once  . clindamycin (CLEOCIN) 900 mg IVPB (ADD-Vant)  900 mg Intravenous Once  . cefTRIAXone (ROCEPHIN) IVPB 1 gram/50 mL D5W      . ibuprofen      . insulin aspart  0-9 Units Subcutaneous Q4H  . levetiracetam  500 mg Intravenous BID  . levofloxacin (LEVAQUIN) IV  500 mg Intravenous QHS  . pantoprazole (PROTONIX) IV  40 mg Intravenous Q24H  . piperacillin-tazobactam (ZOSYN)  IV  3.375 g Intravenous Once  . sodium chloride  1,000 mL Intravenous Once  . sodium chloride  500 mL Intravenous Once  . sodium chloride  500 mL Intravenous Once  . sodium chloride  3 mL Intravenous Q12H  . vancomycin  1,000 mg Intravenous Once  . vancomycin  1,000 mg Intravenous Q12H  . DISCONTD: levetiracetam  500 mg Intravenous Q12H  . DISCONTD: levofloxacin (LEVAQUIN) IV  500 mg Intravenous Q24H  . DISCONTD: metoprolol  5 mg Intravenous Q6H   Continuous Infusions:   . 0.9 % NaCl with KCl 20 mEq / L 100 mL/hr at 10/09/11 0246  . DISCONTD: sodium chloride 20 mL/hr at 10/08/11 1949    Principal Problem:  *Fever Active Problems:  HTN (hypertension)  SAH (subarachnoid hemorrhage) 08/16/11  Seizure disorder, seizure 09/21/11 while in rehab  VP (ventriculoperitoneal) shunt status  Diabetes mellitus, type  2    Time spent: > 30 minutes    Penny Pia  [facility] Pager [phone] If 8PM-8AM, please contact night-coverage at [email or web address], password [facility] 10/09/2011, 9:35 AM  LOS: 1 day

## 2011-10-09 NOTE — Consult Note (Signed)
[facility] CONSULT [facility]     Reason for Consult: Confusion, seizure and fever in setting of ventricular shunt.  CC: Confusion, seizure and fever.   HPI:    [name] is an 61 y.o. female with a recent history of subarachnoid hemorrhage and hydrocephalus, treated with ventricular shunt in August of this year. She was maintained on Keppra for seizure prophylaxis. A follow up cranial CT scan due to bouts of lethargy 09/07/2011 showed VP shunt well positioned with no hemorrhage or new brain infarcts. The patient was cared for after initial stages of recovery in the comprehensive rehabilitation program, then discharged to SNF. At the SNF she developed a fever of as high as 105.6 and had a seizure lasting 15-20 minutes per husband. Also developed AMS. She was transferred to [facility] for evaluation and treatment. Initial evaluation per primary team revealed no neck stiffness and fever still present at 103. Started on antibiotics on admission. Neurology called to further evaluate.   CT performed on 10/5 revealed, per radiology, no acute findings, with evolving encephalomalacia in the ACA territory of the brain. The ventricles are normal in caliber and unchanged relative to the prior CT scan.   Urinalysis was negative for LA and nitrite, but with elevated protein and many bacteria. Urine per nursing was foul smelling and turbid.  White count normal at 7.7; however, there is neutrophilic predominance.    Past Medical History  Diagnosis Date  . Hypertension     Past Surgical History  Procedure Date  . Abdominal hysterectomy   . Ventriculoperitoneal shunt 09/02/2011    Procedure: SHUNT INSERTION VENTRICULAR-PERITONEAL;  Surgeon: Maeola Harman, MD;  Location: [facility];  Service: Neurosurgery;  Laterality: Right;    No family history on file.  Social History:  reports that she has never smoked. She does not have any smokeless tobacco history on file. She  reports that she does not drink alcohol or use illicit drugs.  Allergies  Allergen Reactions  . Lactose Intolerance (Gi) Other (See Comments)    G.I. Upset    Medications:    Prior to Admission:  Prescriptions prior to admission  Medication Sig Dispense Refill  . docusate sodium (COLACE) 100 MG capsule Take 100 mg by mouth 2 (two) times daily.      . insulin glargine (LANTUS) 100 UNIT/ML injection Inject 4 Units into the skin daily.      Marland Kitchen labetalol (NORMODYNE) 200 MG tablet Take 200 mg by mouth 3 (three) times daily.      Marland Kitchen levETIRAcetam (KEPPRA) 500 MG tablet Take 500 mg by mouth 2 (two) times daily.      Marland Kitchen levofloxacin (LEVAQUIN) 500 MG tablet Take 500 mg by mouth daily. For 7 days (start date 10/08/11)      . lisinopril (PRINIVIL,ZESTRIL) 10 MG tablet Take 10 mg by mouth daily.      . methylphenidate (RITALIN) 10 MG tablet Take 10 mg by mouth 2 (two) times daily.      . pantoprazole (PROTONIX) 40 MG tablet Take 40 mg by mouth daily.      . cloNIDine (CATAPRES) 0.1 MG tablet Take 0.1 mg by mouth every 6 (six) hours as needed. Give if sbp greater than 160       Scheduled:   . acetaminophen      . cefTRIAXone (ROCEPHIN)  IV  2 g Intravenous BID  .  cefTRIAXone (ROCEPHIN)  IV  2 g Intravenous Once  . clindamycin (CLEOCIN) 900 mg IVPB (ADD-Vant)  900 mg Intravenous Once  . cefTRIAXone (ROCEPHIN) IVPB 1 gram/50 mL D5W      . ibuprofen      . insulin aspart  0-9 Units Subcutaneous Q4H  . levetiracetam  500 mg Intravenous BID  . levofloxacin (LEVAQUIN) IV  500 mg Intravenous QHS  . pantoprazole (PROTONIX) IV  40 mg Intravenous Q24H  . piperacillin-tazobactam (ZOSYN)  IV  3.375 g Intravenous Once  . sodium chloride  1,000 mL Intravenous Once  . sodium chloride  500 mL Intravenous Once  . sodium chloride  500 mL Intravenous Once  . sodium chloride  3 mL Intravenous Q12H  . vancomycin  1,000 mg Intravenous Once  . vancomycin  1,000 mg Intravenous Q12H  . DISCONTD: levetiracetam  500  mg Intravenous Q12H  . DISCONTD: levofloxacin (LEVAQUIN) IV  500 mg Intravenous Q24H  . DISCONTD: metoprolol  5 mg Intravenous Q6H   Continuous:   . 0.9 % NaCl with KCl 20 mEq / L 100 mL/hr at 10/09/11 0246  . DISCONTD: sodium chloride 20 mL/hr at 10/08/11 1949   ZOX:[id number], bisacodyl, HYDROmorphone (DILAUDID) injection, sodium phosphate, DISCONTD: hydrALAZINE  Review of Systems - Patient is unable to provide a ROS due to confusion/somnolence.   Blood pressure 95/57, pulse 79, temperature 98.8 F (37.1 C), temperature source Rectal, resp. rate 20, SpO2 100.00%.   Neurologic Examination:   Mental Status: Somnolent, requiring constant noxious stimulation to maintain a semi-awake, drowsy state. Able to answer only one simple question. Follows commands only during constant tactile stimulation.  Cranial Nerves: II-PERRL, fixates on examiner when requested.  II/IV/VI- EOMI without nystagmus.  V/VII-Face symmetric. Grimaces to noxious stimuli and blinks with stimulation of V1 distribution bilaterally.  VIII-Attends only to loud verbal stimuli.  IX/X-Hypophonic speech.  XI-Shoulders symmetric.  XII-Tongue weakly protrudes at the midline.  Motor: 3-4/5 bilaterally in all 4 extremities. Effort is poor with symmetrical movements.  Sensory: Intact to noxious x 4.  DTR's: Normoactive without asymmetry. Toes equivocal.  Cerebellar: Slow FNF requiring frequent prompting to follow command. No gross ataxia noted.   General: No neck stiffness or photosensitivity. No edema noted.   No results found for this basename: cbc, bmp, coags, chol, tri, ldl, hga1c    Results for orders placed during the hospital encounter of 10/08/11 (from the past 48 hour(s))  CBC WITH DIFFERENTIAL     Status: Abnormal   Collection Time   10/08/11  6:56 PM      Component Value Range Comment   WBC 7.7  4.0 - 10.5 K/uL    RBC 4.31  3.87 - 5.11 MIL/uL    Hemoglobin 11.4 (*) 12.0 - 15.0 g/dL    HCT 14.7 (*) 82.9  - 46.0 %    MCV 83.1  78.0 - 100.0 fL    MCH 26.5  26.0 - 34.0 pg    MCHC 31.8  30.0 - 36.0 g/dL    RDW 56.2 (*) 13.0 - 15.5 %    Platelets 92 (*) 150 - 400 K/uL    Neutrophils Relative 91 (*) 43 - 77 %    Lymphocytes Relative 7 (*) 12 - 46 %    Monocytes Relative 2 (*) 3 - 12 %    Eosinophils Relative 0  0 - 5 %    Basophils Relative 0  0 - 1 %    Band Neutrophils 0  0 - 10 %  Metamyelocytes Relative 0      Myelocytes 0      Promyelocytes Absolute 0      Blasts 0      nRBC 0  0 /100 WBC    Neutro Abs 7.0  1.7 - 7.7 K/uL    Lymphs Abs 0.5 (*) 0.7 - 4.0 K/uL    Monocytes Absolute 0.2  0.1 - 1.0 K/uL    Eosinophils Absolute 0.0  0.0 - 0.7 K/uL    Basophils Absolute 0.0  0.0 - 0.1 K/uL   COMPREHENSIVE METABOLIC PANEL     Status: Abnormal   Collection Time   10/08/11  6:56 PM      Component Value Range Comment   Sodium 137  135 - 145 mEq/L    Potassium 3.3 (*) 3.5 - 5.1 mEq/L    Chloride 101  96 - 112 mEq/L    CO2 23  19 - 32 mEq/L    Glucose, Bld 188 (*) 70 - 99 mg/dL    BUN 27 (*) 6 - 23 mg/dL    Creatinine, Ser 1.61 (*) 0.50 - 1.10 mg/dL    Calcium 9.6  8.4 - 09.6 mg/dL    Total Protein 7.5  6.0 - 8.3 g/dL    Albumin 2.7 (*) 3.5 - 5.2 g/dL    AST 42 (*) 0 - 37 U/L    ALT 29  0 - 35 U/L    Alkaline Phosphatase 129 (*) 39 - 117 U/L    Total Bilirubin 1.1  0.3 - 1.2 mg/dL    GFR calc non Af Amer 44 (*) >90 mL/min    GFR calc Af Amer 51 (*) >90 mL/min   CULTURE, BLOOD (ROUTINE X 2)     Status: Normal (Preliminary result)   Collection Time   10/08/11  6:56 PM      Component Value Range Comment   Specimen Description LEFT ANTECUBITAL      Special Requests BOTTLES DRAWN AEROBIC AND ANAEROBIC 6CC      Culture PENDING      Report Status PENDING     LACTIC ACID, PLASMA     Status: Normal   Collection Time   10/08/11  6:57 PM      Component Value Range Comment   Lactic Acid, Venous 2.0  0.5 - 2.2 mmol/L   CULTURE, BLOOD (ROUTINE X 2)     Status: Normal (Preliminary result)     Collection Time   10/08/11  7:15 PM      Component Value Range Comment   Specimen Description BLOOD LEFT HAND      Special Requests BOTTLES DRAWN AEROBIC AND ANAEROBIC 6CC      Culture PENDING      Report Status PENDING     URINALYSIS, ROUTINE W REFLEX MICROSCOPIC     Status: Abnormal   Collection Time   10/08/11  8:04 PM      Component Value Range Comment   Color, Urine YELLOW  YELLOW    APPearance HAZY (*) CLEAR    Specific Gravity, Urine 1.025  1.005 - 1.030    pH 6.5  5.0 - 8.0    Glucose, UA NEGATIVE  NEGATIVE mg/dL    Hgb urine dipstick LARGE (*) NEGATIVE    Bilirubin Urine NEGATIVE  NEGATIVE    Ketones, ur NEGATIVE  NEGATIVE mg/dL    Protein, ur 045 (*) NEGATIVE mg/dL    Urobilinogen, UA 0.2  0.0 - 1.0 mg/dL    Nitrite NEGATIVE  NEGATIVE  Leukocytes, UA NEGATIVE  NEGATIVE   URINE MICROSCOPIC-ADD ON     Status: Abnormal   Collection Time   10/08/11  8:04 PM      Component Value Range Comment   Squamous Epithelial / LPF FEW (*) RARE    WBC, UA 11-20  <3 WBC/hpf    RBC / HPF 11-20  <3 RBC/hpf    Bacteria, UA MANY (*) RARE    Casts GRANULAR CAST (*) NEGATIVE     Ct Head Wo Contrast  10/08/2011  *RADIOLOGY REPORT*  Clinical Data: Status post embolization of anterior communicating artery aneurysm after subarachnoid hemorrhage.  CT HEAD WITHOUT CONTRAST  Technique:  Contiguous axial images were obtained from the base of the skull through the vertex without contrast.  Comparison: 09/21/2011  Findings: Stable coarse and positioning of ventriculostomy catheter.  No evidence of hydrocephalus or intraventricular blood. Evolving encephalomalacia in the left ACA territory and inferior right frontal lobe without progressive infarction since the prior study.  No evidence of acute hemorrhage, extra-axial fluid collections or mass effect.  The skull is unremarkable.  Aneurysm coils again identified.  IMPRESSION: No acute findings.  Evolving encephalomalacia in the ACA territory of the  brain.   Original Report Authenticated By: Reola Calkins, M.D.    Ct Chest Wo Contrast  10/08/2011  *RADIOLOGY REPORT*  Clinical Data: Fever.  CT CHEST WITHOUT CONTRAST  Technique:  Multidetector CT imaging of the chest was performed following the standard protocol without IV contrast.  Comparison: Chest x-ray earlier today.  Findings: There is cardiomegaly.  Bibasilar atelectasis present. No pleural effusions.  Aorta is tortuous, non-aneurysmal. No mediastinal, hilar, or axillary adenopathy.  Visualized thyroid and chest wall soft tissues unremarkable. Imaging into the upper abdomen shows no acute findings.   No acute bony abnormality.  IMPRESSION: Cardiomegaly, bibasilar atelectasis.   Original Report Authenticated By: Cyndie Chime, M.D.    Dg Chest Portable 1 View  10/08/2011  *RADIOLOGY REPORT*  Clinical Data: Seizure.  Fever.  PORTABLE CHEST - 1 VIEW  Comparison: 09/03/2011  Findings: Artifact overlies chest.  Shunt tube overlies the chest. There is a poor inspiration.  Basilar atelectasis or infiltrate is not excluded.  Upper lungs are clear.  No bony abnormalities seen.  IMPRESSION: Poor inspiration.  Cannot rule out atelectasis or infiltrate at the lung bases.  Otherwise negative.   Original Report Authenticated By: Thomasenia Sales, M.D.      Assessment/Plan:   Assessment: 1. Confusion. Findings on CT are NOT suggestive of shunt failure. Her elevated percentage neutrophils on CBC and fever are suggestive of infection as the most likely etiology, most likely UTI plus dehydration causing AMS in the setting of decreased neurological reserve at baseline. Meningitis also possible, but less likely given lack of neck stiffness on exam.  2. Seizure. Most likely due to combination of decreased seizure threshold in setting of infection and old left frontal encephalomalacia and residual cortical damage from prior subarachnoid hemorrhage, which could serve as seizure onset zones. Calcium and sodium  levels normal.  3. S/p shunt placement in August for treatment of hydrocephalus secondary to subarachnoid hemorrhage. See item #1, above.  4. Chronic left frontal lobe encephalomalacia and small old infarction within left basal ganglia.  5. History of anterior communicating artery aneurysm, s/p coiling 08/19/2011.  6. Elevated BUN/Cr consistent with dehydration. See item #1 above.   Plan: 1. Would discuss with neurosurgery the indications for possible tapping of the shunt and evaluation of CSF for possible  meningitis. Would run the following labs on CSF: Cell count, protein, glucose, gram stain and bacterial culture, fungal stain and culture, cryptococcal antigen, HSV PCR.  2. Empiric IV antibiotics for possible meningitis: Continue ceftriaxone and vancomycin. 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 Would discontinue PO Levaquin unless there is another indication for this antibiotic. Ceftriaxone also a reasonable choice to empirically cover possible UTI until culture results come back.  3. Continue IV Keppra, 500 mg BID. If she exhibits recurrent seizure or twitching, give supplemental load of 1000 mg IV Keppra and increase scheduled dosing to 1000 mg IV BID.  4. EEG.  5. May need ID consult.  6. Magnesium level.    Electronically signed: Dr. Caryl Pina   10/09/2011, 12:21 AM

## 2011-10-09 NOTE — Consult Note (Signed)
Reason for Consult:sz, fever Referring Physician:Leopold Orvan Falconer, MD   [name] is an 61 y.o. female.  HPI: [name] is a 61 year old femalewith subarachnoid hemorrhage with hydrocephalus. She had reportedly experienced severe headaches 08-16-11 followed by loss of consciousness. Supportive care had been initiated. She was transferred for intervention when her status began to improve. ACA aneurysm identified, with SAH & hydrocephalus. IVC placed by Dr. Venetia Maxon on 08/18/11. ACAA coiled by Dr. Corliss Skains on 08/19/11. Stable in ICU throughout. IVC inadvertently pulled during CT scan on 08/28/11. She remained stable, though by 09/01/11 CT demonstrated enlarging ventricles requiring ventriculoperitoneal shunt. VP shunt was placed by Dr. Venetia Maxon & Dr. Abbey Chatters on 09/02/11. She progressed slowly thereafter, transferring to [facility] from the ICU. Episodic lethargy/sleep/alertness thought to be r/t sleep patterns in years past. Transfered to [facility] for intense rehabilitation.     The patient with noted limited gains although her attention continued to improve. She remained  on Ritalin therapy. It was felt skilled nursing facility was needed  And d/c to there 10/1   Family report a change in her status since arriving to [facility] in that she is no longer walking, not eating well, and barely speaks. A she reportedly had an episode of seizure the details of which are unclear but involve clenching of the teeth and foaming at her mouth. She was also noted to have a temperature of 105.6 rectally. At [facility] emergency room she was noted to have a temperature of 103.6.   [facility] - now pt afebrile - and family states pt back to baseline        PMH:  Past Medical History  Diagnosis Date  . Hypertension     Past Surgical History  Procedure Date  . Abdominal hysterectomy   . Ventriculoperitoneal shunt 09/02/2011    Procedure: SHUNT INSERTION VENTRICULAR-PERITONEAL;  Surgeon: Maeola Harman, MD;   Location: [facility];  Service: Neurosurgery;  Laterality: Right;    Family History: No family history on file.  Social History:  reports that she has never smoked. She does not have any smokeless tobacco history on file. She reports that she does not drink alcohol or use illicit drugs.  Allergies:  Allergies  Allergen Reactions  . Lactose Intolerance (Gi) Other (See Comments)    G.I. Upset    Medications:  Prior to Admission:  Prescriptions prior to admission  Medication Sig Dispense Refill  . docusate sodium (COLACE) 100 MG capsule Take 100 mg by mouth 2 (two) times daily.      . insulin glargine (LANTUS) 100 UNIT/ML injection Inject 4 Units into the skin daily.      Marland Kitchen labetalol (NORMODYNE) 200 MG tablet Take 200 mg by mouth 3 (three) times daily.      Marland Kitchen levETIRAcetam (KEPPRA) 500 MG tablet Take 500 mg by mouth 2 (two) times daily.      Marland Kitchen levofloxacin (LEVAQUIN) 500 MG tablet Take 500 mg by mouth daily. For 7 days (start date 10/08/11)      . lisinopril (PRINIVIL,ZESTRIL) 10 MG tablet Take 10 mg by mouth daily.      . methylphenidate (RITALIN) 10 MG tablet Take 10 mg by mouth 2 (two) times daily.      . pantoprazole (PROTONIX) 40 MG tablet Take 40 mg by mouth daily.      . cloNIDine (CATAPRES) 0.1 MG tablet Take 0.1 mg by mouth every 6 (six) hours as needed. Give if sbp greater than 160  Results for orders placed during the hospital encounter of 10/08/11 (from the past 48 hour(s))  CBC WITH DIFFERENTIAL     Status: Abnormal   Collection Time   10/08/11  6:56 PM      Component Value Range Comment   WBC 7.7  4.0 - 10.5 K/uL    RBC 4.31  3.87 - 5.11 MIL/uL    Hemoglobin 11.4 (*) 12.0 - 15.0 g/dL    HCT 16.1 (*) 09.6 - 46.0 %    MCV 83.1  78.0 - 100.0 fL    MCH 26.5  26.0 - 34.0 pg    MCHC 31.8  30.0 - 36.0 g/dL    RDW 04.5 (*) 40.9 - 15.5 %    Platelets 92 (*) 150 - 400 K/uL    Neutrophils Relative 91 (*) 43 - 77 %    Lymphocytes Relative 7 (*) 12 - 46 %    Monocytes  Relative 2 (*) 3 - 12 %    Eosinophils Relative 0  0 - 5 %    Basophils Relative 0  0 - 1 %    Band Neutrophils 0  0 - 10 %    Metamyelocytes Relative 0      Myelocytes 0      Promyelocytes Absolute 0      Blasts 0      nRBC 0  0 /100 WBC    Neutro Abs 7.0  1.7 - 7.7 K/uL    Lymphs Abs 0.5 (*) 0.7 - 4.0 K/uL    Monocytes Absolute 0.2  0.1 - 1.0 K/uL    Eosinophils Absolute 0.0  0.0 - 0.7 K/uL    Basophils Absolute 0.0  0.0 - 0.1 K/uL   COMPREHENSIVE METABOLIC PANEL     Status: Abnormal   Collection Time   10/08/11  6:56 PM      Component Value Range Comment   Sodium 137  135 - 145 mEq/L    Potassium 3.3 (*) 3.5 - 5.1 mEq/L    Chloride 101  96 - 112 mEq/L    CO2 23  19 - 32 mEq/L    Glucose, Bld 188 (*) 70 - 99 mg/dL    BUN 27 (*) 6 - 23 mg/dL    Creatinine, Ser 8.11 (*) 0.50 - 1.10 mg/dL    Calcium 9.6  8.4 - 91.4 mg/dL    Total Protein 7.5  6.0 - 8.3 g/dL    Albumin 2.7 (*) 3.5 - 5.2 g/dL    AST 42 (*) 0 - 37 U/L    ALT 29  0 - 35 U/L    Alkaline Phosphatase 129 (*) 39 - 117 U/L    Total Bilirubin 1.1  0.3 - 1.2 mg/dL    GFR calc non Af Amer 44 (*) >90 mL/min    GFR calc Af Amer 51 (*) >90 mL/min   CULTURE, BLOOD (ROUTINE X 2)     Status: Normal (Preliminary result)   Collection Time   10/08/11  6:56 PM      Component Value Range Comment   Specimen Description LEFT ANTECUBITAL      Special Requests BOTTLES DRAWN AEROBIC AND ANAEROBIC 6CC      Culture PENDING      Report Status PENDING     LACTIC ACID, PLASMA     Status: Normal   Collection Time   10/08/11  6:57 PM      Component Value Range Comment   Lactic Acid, Venous 2.0  0.5 -  2.2 mmol/L   CULTURE, BLOOD (ROUTINE X 2)     Status: Normal (Preliminary result)   Collection Time   10/08/11  7:15 PM      Component Value Range Comment   Specimen Description BLOOD LEFT HAND      Special Requests BOTTLES DRAWN AEROBIC AND ANAEROBIC 6CC      Culture PENDING      Report Status PENDING     URINALYSIS, ROUTINE W REFLEX  MICROSCOPIC     Status: Abnormal   Collection Time   10/08/11  8:04 PM      Component Value Range Comment   Color, Urine YELLOW  YELLOW    APPearance HAZY (*) CLEAR    Specific Gravity, Urine 1.025  1.005 - 1.030    pH 6.5  5.0 - 8.0    Glucose, UA NEGATIVE  NEGATIVE mg/dL    Hgb urine dipstick LARGE (*) NEGATIVE    Bilirubin Urine NEGATIVE  NEGATIVE    Ketones, ur NEGATIVE  NEGATIVE mg/dL    Protein, ur 161 (*) NEGATIVE mg/dL    Urobilinogen, UA 0.2  0.0 - 1.0 mg/dL    Nitrite NEGATIVE  NEGATIVE    Leukocytes, UA NEGATIVE  NEGATIVE   URINE MICROSCOPIC-ADD ON     Status: Abnormal   Collection Time   10/08/11  8:04 PM      Component Value Range Comment   Squamous Epithelial / LPF FEW (*) RARE    WBC, UA 11-20  <3 WBC/hpf    RBC / HPF 11-20  <3 RBC/hpf    Bacteria, UA MANY (*) RARE    Casts GRANULAR CAST (*) NEGATIVE   GLUCOSE, CAPILLARY     Status: Abnormal   Collection Time   10/09/11  1:36 AM      Component Value Range Comment   Glucose-Capillary 194 (*) 70 - 99 mg/dL   MRSA PCR SCREENING     Status: Normal   Collection Time   10/09/11  1:55 AM      Component Value Range Comment   MRSA by PCR NEGATIVE  NEGATIVE     Ct Head Wo Contrast  10/08/2011  *RADIOLOGY REPORT*  Clinical Data: Status post embolization of anterior communicating artery aneurysm after subarachnoid hemorrhage.  CT HEAD WITHOUT CONTRAST  Technique:  Contiguous axial images were obtained from the base of the skull through the vertex without contrast.  Comparison: 09/21/2011  Findings: Stable coarse and positioning of ventriculostomy catheter.  No evidence of hydrocephalus or intraventricular blood. Evolving encephalomalacia in the left ACA territory and inferior right frontal lobe without progressive infarction since the prior study.  No evidence of acute hemorrhage, extra-axial fluid collections or mass effect.  The skull is unremarkable.  Aneurysm coils again identified.  IMPRESSION: No acute findings.  Evolving  encephalomalacia in the ACA territory of the brain.   Original Report Authenticated By: Reola Calkins, M.D.    Ct Chest Wo Contrast  10/08/2011  *RADIOLOGY REPORT*  Clinical Data: Fever.  CT CHEST WITHOUT CONTRAST  Technique:  Multidetector CT imaging of the chest was performed following the standard protocol without IV contrast.  Comparison: Chest x-ray earlier today.  Findings: There is cardiomegaly.  Bibasilar atelectasis present. No pleural effusions.  Aorta is tortuous, non-aneurysmal. No mediastinal, hilar, or axillary adenopathy.  Visualized thyroid and chest wall soft tissues unremarkable. Imaging into the upper abdomen shows no acute findings.   No acute bony abnormality.  IMPRESSION: Cardiomegaly, bibasilar atelectasis.   Original Report Authenticated  By: Cyndie Chime, M.D.    Dg Chest Portable 1 View  10/08/2011  *RADIOLOGY REPORT*  Clinical Data: Seizure.  Fever.  PORTABLE CHEST - 1 VIEW  Comparison: 09/03/2011  Findings: Artifact overlies chest.  Shunt tube overlies the chest. There is a poor inspiration.  Basilar atelectasis or infiltrate is not excluded.  Upper lungs are clear.  No bony abnormalities seen.  IMPRESSION: Poor inspiration.  Cannot rule out atelectasis or infiltrate at the lung bases.  Otherwise negative.   Original Report Authenticated By: Thomasenia Sales, M.D.     ROS   - negative  Blood pressure 133/70, pulse 63, temperature 98.6 F (37 C), temperature source Oral, resp. rate 16, height 5\' 5"  (1.651 m), weight 95.5 kg (210 lb 8.6 oz), SpO2 99.00%. Neuro  - easily arousable  ,  Ox3 - FC all 4    CN II-XII intact   ,  Neck supple  Assessment/Plan: Pt with UTI - and SZ - had increased temp - now normal - this may be secondary to UTI  And /or Seizure -    CT head - no change from previous and WBC normal    -     Recommend treating UTI -  Agree with neurology consult for sz's  -   Will follow  Joletta Manner R, MD 10/09/2011, 5:40 AM

## 2011-10-09 NOTE — Progress Notes (Signed)
ANTIBIOTIC CONSULT NOTE - INITIAL  Pharmacy Consult for Vancomycin Indication: fevers s/p VP shunt 08/2011  Allergies  Allergen Reactions  . Lactose Intolerance (Gi) Other (See Comments)    G.I. Upset    Patient Measurements: Height: 5\' 5"  (165.1 cm) Weight: 210 lb 8.6 oz (95.5 kg) IBW/kg (Calculated) : 57  Adjusted Body Weight: 75 kg   Vital Signs: Temp: 98 F (36.7 C) (10/06 0127) Temp src: Oral (10/06 0127) BP: 95/57 mmHg (10/06 0018) Pulse Rate: 79  (10/06 0127) Intake/Output from previous day:   Intake/Output from this shift:    Labs:  [facility] 10/08/11 1856  WBC 7.7  HGB 11.4*  PLT 92*  LABCREA --  CREATININE 1.27*   Estimated Creatinine Clearance: 51.8 ml/min (by C-G formula based on Cr of 1.27). No results found for this basename: VANCOTROUGH:2,VANCOPEAK:2,VANCORANDOM:2,GENTTROUGH:2,GENTPEAK:2,GENTRANDOM:2,TOBRATROUGH:2,TOBRAPEAK:2,TOBRARND:2,AMIKACINPEAK:2,AMIKACINTROU:2,AMIKACIN:2, in the last 72 hours   Microbiology: Recent Results (from the past 720 hour(s))  URINE CULTURE     Status: Normal   Collection Time   09/09/11 10:36 PM      Component Value Range Status Comment   Specimen Description URINE, CATHETERIZED   Final    Special Requests NONE   Final    Culture  Setup Time 09/10/2011 11:07   Final    Colony Count >=100,000 COLONIES/ML   Final    Culture     Final    Value: Multiple bacterial morphotypes present, none predominant. Suggest appropriate recollection if clinically indicated.   Report Status 09/11/2011 FINAL   Final   URINE CULTURE     Status: Normal   Collection Time   09/12/11 11:10 AM      Component Value Range Status Comment   Specimen Description URINE, CATHETERIZED   Final    Special Requests NONE   Final    Culture  Setup Time 09/12/2011 12:21   Final    Colony Count >=100,000 COLONIES/ML   Final    Culture     Final    Value: Multiple bacterial morphotypes present, none predominant. Suggest appropriate recollection if  clinically indicated.   Report Status 09/14/2011 FINAL   Final   URINE CULTURE     Status: Normal   Collection Time   09/29/11  6:16 AM      Component Value Range Status Comment   Specimen Description URINE, CLEAN CATCH   Final    Special Requests NONE   Final    Culture  Setup Time 09/29/2011 12:43   Final    Colony Count >=100,000 COLONIES/ML   Final    Culture     Final    Value: Multiple bacterial morphotypes present, none predominant. Suggest appropriate recollection if clinically indicated.   Report Status 09/30/2011 FINAL   Final   URINE CULTURE     Status: Normal   Collection Time   09/30/11  8:17 PM      Component Value Range Status Comment   Specimen Description URINE, CATHETERIZED   Final    Special Requests NONE   Final    Culture  Setup Time 09/30/2011 20:49   Final    Colony Count >=100,000 COLONIES/ML   Final    Culture     Final    Value: Multiple bacterial morphotypes present, none predominant. Suggest appropriate recollection if clinically indicated.   Report Status 10/01/2011 FINAL   Final   CULTURE, BLOOD (ROUTINE X 2)     Status: Normal (Preliminary result)   Collection Time   10/08/11  6:56 PM  Component Value Range Status Comment   Specimen Description LEFT ANTECUBITAL   Final    Special Requests BOTTLES DRAWN AEROBIC AND ANAEROBIC 6CC   Final    Culture PENDING   Incomplete    Report Status PENDING   Incomplete   CULTURE, BLOOD (ROUTINE X 2)     Status: Normal (Preliminary result)   Collection Time   10/08/11  7:15 PM      Component Value Range Status Comment   Specimen Description BLOOD LEFT HAND   Final    Special Requests BOTTLES DRAWN AEROBIC AND ANAEROBIC 6CC   Final    Culture PENDING   Incomplete    Report Status PENDING   Incomplete     Medical History: Past Medical History  Diagnosis Date  . Hypertension   SAH s/p VP shunt 08/2011  Medications:  Prescriptions prior to admission  Medication Sig Dispense Refill  . docusate sodium  (COLACE) 100 MG capsule Take 100 mg by mouth 2 (two) times daily.      . insulin glargine (LANTUS) 100 UNIT/ML injection Inject 4 Units into the skin daily.      Marland Kitchen labetalol (NORMODYNE) 200 MG tablet Take 200 mg by mouth 3 (three) times daily.      Marland Kitchen levETIRAcetam (KEPPRA) 500 MG tablet Take 500 mg by mouth 2 (two) times daily.      Marland Kitchen levofloxacin (LEVAQUIN) 500 MG tablet Take 500 mg by mouth daily. For 7 days (start date 10/08/11)      . lisinopril (PRINIVIL,ZESTRIL) 10 MG tablet Take 10 mg by mouth daily.      . methylphenidate (RITALIN) 10 MG tablet Take 10 mg by mouth 2 (two) times daily.      . pantoprazole (PROTONIX) 40 MG tablet Take 40 mg by mouth daily.      . cloNIDine (CATAPRES) 0.1 MG tablet Take 0.1 mg by mouth every 6 (six) hours as needed. Give if sbp greater than 160       Assessment: 61 yo female with fevers for empiric antibiotics.  Vancomycin 1 g IV given at [facility] at 2000 10/5.  Goal of Therapy:  Vancomycin trough level 15-20 mcg/ml  Plan:  Vancomycin 1 g IV q12h  [name], [name] 10/09/2011,1:31 AM

## 2011-10-09 NOTE — Progress Notes (Signed)
Per her husband is at bedside, the patient is back to her post hemorrhage baseline.  Exam: Filed Vitals:   10/09/11 1400  BP: 130/65  Pulse: 62  Temp:   Resp: 20   General: Patient is in bed, sleep Mental status: Patient awakens easily and says that she is feeling fine. She denies headache, neck stiffness. Cranial nerves: Pupils equal round and reactive, patient fixates and tracks examiner, face symmetric Motor: Patient moves all extremities well  Impression: 61 year old female with breakthrough seizure in the setting of infection. She has a urinary tract infection identified as her source. Neurosurgery does not feel the shunt needs to be tapped currently. She does not have any headache, neck stiffness, and her mental status has come back to baseline as has her temperature, so this is reasonable.  I would not change her seizure regimen given that she had a reason to have a breakthrough seizure, and sedation has been an issue. She continues to have breakthrough seizures and the settings of infection, then a change may need to be considered.  1) continue Keppra 500 mg twice a day 2) neurology will sign off at this time please call with any recurrent seizure activity.

## 2011-10-10 DIAGNOSIS — N390 Urinary tract infection, site not specified: Principal | ICD-10-CM

## 2011-10-10 DIAGNOSIS — I10 Essential (primary) hypertension: Secondary | ICD-10-CM

## 2011-10-10 LAB — BASIC METABOLIC PANEL
BUN: 24 mg/dL — ABNORMAL HIGH (ref 6–23)
CO2: 23 mEq/L (ref 19–32)
Calcium: 9.1 mg/dL (ref 8.4–10.5)
GFR calc non Af Amer: 53 mL/min — ABNORMAL LOW (ref >90)
Glucose, Bld: 111 mg/dL — ABNORMAL HIGH (ref 70–99)
Potassium: 3.3 mEq/L — ABNORMAL LOW (ref 3.5–5.1)

## 2011-10-10 LAB — CBC
HCT: 30.8 % — ABNORMAL LOW (ref 36.0–46.0)
Hemoglobin: 9.7 g/dL — ABNORMAL LOW (ref 12.0–15.0)
MCH: 25.9 pg — ABNORMAL LOW (ref 26.0–34.0)
MCHC: 31.5 g/dL (ref 30.0–36.0)

## 2011-10-10 LAB — GLUCOSE, CAPILLARY
Glucose-Capillary: 100 mg/dL — ABNORMAL HIGH (ref 70–99)
Glucose-Capillary: 103 mg/dL — ABNORMAL HIGH (ref 70–99)
Glucose-Capillary: 125 mg/dL — ABNORMAL HIGH (ref 70–99)
Glucose-Capillary: 149 mg/dL — ABNORMAL HIGH (ref 70–99)
Glucose-Capillary: 158 mg/dL — ABNORMAL HIGH (ref 70–99)
Glucose-Capillary: 87 mg/dL (ref 70–99)

## 2011-10-10 MED ORDER — LABETALOL HCL 200 MG PO TABS
200.0000 mg | ORAL_TABLET | Freq: Three times a day (TID) | ORAL | Status: DC
  Administered 2011-10-10 – 2011-10-18 (×25): 200 mg via ORAL
  Filled 2011-10-10 (×27): qty 1

## 2011-10-10 MED ORDER — LISINOPRIL 10 MG PO TABS
10.0000 mg | ORAL_TABLET | Freq: Every day | ORAL | Status: DC
  Administered 2011-10-10 – 2011-10-17 (×8): 10 mg via ORAL
  Filled 2011-10-10 (×9): qty 1

## 2011-10-10 MED ORDER — HYDRALAZINE HCL 20 MG/ML IJ SOLN
10.0000 mg | INTRAMUSCULAR | Status: DC | PRN
  Administered 2011-10-10 – 2011-10-16 (×7): 10 mg via INTRAVENOUS
  Filled 2011-10-10 (×7): qty 0.5

## 2011-10-10 MED ORDER — CLONIDINE HCL 0.1 MG PO TABS
0.1000 mg | ORAL_TABLET | Freq: Four times a day (QID) | ORAL | Status: DC | PRN
  Administered 2011-10-11 – 2011-10-13 (×2): 0.1 mg via ORAL
  Filled 2011-10-10 (×3): qty 1

## 2011-10-10 MED ORDER — ENSURE PUDDING PO PUDG
1.0000 | Freq: Three times a day (TID) | ORAL | Status: DC
  Administered 2011-10-10 – 2011-10-11 (×3): 1 via ORAL

## 2011-10-10 MED ORDER — LABETALOL HCL 5 MG/ML IV SOLN
10.0000 mg | Freq: Once | INTRAVENOUS | Status: AC
  Administered 2011-10-10: 10 mg via INTRAVENOUS
  Filled 2011-10-10: qty 4

## 2011-10-10 MED ORDER — ADULT MULTIVITAMIN W/MINERALS CH
1.0000 | ORAL_TABLET | Freq: Every day | ORAL | Status: DC
  Administered 2011-10-10 – 2011-10-18 (×9): 1 via ORAL
  Filled 2011-10-10 (×9): qty 1

## 2011-10-10 NOTE — Progress Notes (Signed)
OT Cancellation Note  Patient Details Name: ARIAN YUNK MRN: [id number] DOB: [birth year]   Cancelled Treatment:    Reason Eval/Treat Not Completed: Medical issues which prohibited therapy (Elevated BP).  Have attempted x 2 today.  Will re-attempt tomorrow.  Jeani Hawking M [phone] 10/10/2011, 2:01 PM

## 2011-10-10 NOTE — Progress Notes (Signed)
PT Cancellation Note  Patient Details Name: ELLAMAY FORS MRN: [id number] DOB: 1950-10-27   Cancelled Treatment:    Cancelled due to medical issues which prohibited therapy.  Increased BP.  Will attempt back later today if time allows.     Verdell Face, [place] [phone] 10/10/2011

## 2011-10-10 NOTE — Progress Notes (Signed)
[facility] PROGRESS NOTE  LONIE LAMAGNA AVW:[id number] DOB: 1950-07-16 DOA: 10/08/2011 PCP: No primary provider on file.  Assessment/Plan: 1. H/o subarachnoid hemorrhage with hydrocephalus: Dr. Phoebe Perch on board and will follow up with his recommendations.  CT of head on 10/08/11 reported no acute findings.  Did report however evolving encephalomalacia in the ACA territory.  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 Corliss Skains on 08/19/11. 2. Seizure d/o: Recommendations per neurology.  I have reviewed their recommendations and we will continue the Keppra as mentioned in their note.  Should patient have any more seizure like activity will plan on contacting neurology for further recommendations. 3. UTI: Continue broad spectrum antibiotics until urine culture results are back then we will narrow antibiotic coverage appropriately.  U/A on 10/05 showed hazy urine with large hgb and many bacteria. 4. DM: Blood sugars relatively well controlled at this point.  Will continue SSI and will advance diet to diabetic diet 10/10/11.   5. Hypertension:  Home blood pressure medication was held initially most likely due to lower blood pressure readings on admission.  At this point patient has required hydralazine and labetalol prn.  Will go ahead and add patient's home blood pressure medication regimen.  Will continue the hydralazine prn elevated blood pressures.  Discussed with nursing.  Code Status: Full Disposition Plan: Pending improvement in clinical condition and cessation of fevers.     Consultants:  Neurosurgery: Dr. Phoebe Perch  Neurology: Dr. Otelia Limes  Procedures:  None  Antibiotics:  Levaquin started 10/5  Vancomycin started 10/5  ceftriaxone started 10/5  HPI/Subjective: No new complaints today.  No new seizure like activity reported.  Patient denies any fevers, nausea, abdominal pain, HA's, blurred vision.  Objective: Filed Vitals:   10/10/11 0450 10/10/11 0620 10/10/11  0757 10/10/11 0854  BP: 183/104 183/88 211/114 194/115  Pulse:   95 76  Temp:   98.5 F (36.9 C)   TempSrc:   Oral   Resp:   24 25  Height:      Weight:      SpO2:   98% 92%    Intake/Output Summary (Last 24 hours) at 10/10/11 1007 Last data filed at 10/10/11 0900  Gross per 24 hour  Intake   2260 ml  Output   1475 ml  Net    785 ml   Filed Weights   10/09/11 0127 10/10/11 0444  Weight: 95.5 kg (210 lb 8.6 oz) 98 kg (216 lb 0.8 oz)    Exam:   General:  Pt in NAD, resting supine in bed  Cardiovascular: RRR, No MRG  Respiratory: CTA BL, no increased work of breathing  Abdomen: soft, nt, nd  Data Reviewed: Basic Metabolic Panel:  Lab 10/10/11 8295 10/08/11 1856  NA 144 137  K 3.3* 3.3*  CL 109 101  CO2 23 23  GLUCOSE 111* 188*  BUN 24* 27*  CREATININE 1.08 1.27*  CALCIUM 9.1 9.6  MG -- --  PHOS -- --   Liver Function Tests:  Lab 10/08/11 1856  AST 42*  ALT 29  ALKPHOS 129*  BILITOT 1.1  PROT 7.5  ALBUMIN 2.7*   No results found for this basename: LIPASE:5,AMYLASE:5 in the last 168 hours No results found for this basename: AMMONIA:5 in the last 168 hours CBC:  Lab 10/10/11 0656 10/08/11 1856  WBC 9.9 7.7  NEUTROABS -- 7.0  HGB 9.7* 11.4*  HCT 30.8* 35.8*  MCV 82.1 83.1  PLT 85* 92*   Cardiac Enzymes:  No results found for this basename: CKTOTAL:5,CKMB:5,CKMBINDEX:5,TROPONINI:5 in the last 168 hours BNP (last 3 results) No results found for this basename: PROBNP:3 in the last 8760 hours CBG:  Lab 10/10/11 0812 10/10/11 0451 10/10/11 0026 10/09/11 2028 10/09/11 1704  GLUCAP 103* 92 87 103* 94    Recent Results (from the past 240 hour(s))  URINE CULTURE     Status: Normal   Collection Time   09/30/11  8:17 PM      Component Value Range Status Comment   Specimen Description URINE, CATHETERIZED   Final    Special Requests NONE   Final    Culture  Setup Time 09/30/2011 20:49   Final    Colony Count >=100,000 COLONIES/ML   Final    Culture      Final    Value: Multiple bacterial morphotypes present, none predominant. Suggest appropriate recollection if clinically indicated.   Report Status 10/01/2011 FINAL   Final   CULTURE, BLOOD (ROUTINE X 2)     Status: Normal (Preliminary result)   Collection Time   10/08/11  6:56 PM      Component Value Range Status Comment   Specimen Description LEFT ANTECUBITAL   Final    Special Requests BOTTLES DRAWN AEROBIC AND ANAEROBIC 6CC   Final    Culture NO GROWTH 1 DAY   Final    Report Status PENDING   Incomplete   CULTURE, BLOOD (ROUTINE X 2)     Status: Normal (Preliminary result)   Collection Time   10/08/11  7:15 PM      Component Value Range Status Comment   Specimen Description BLOOD LEFT HAND   Final    Special Requests BOTTLES DRAWN AEROBIC AND ANAEROBIC 6CC   Final    Culture NO GROWTH 1 DAY   Final    Report Status PENDING   Incomplete   MRSA PCR SCREENING     Status: Normal   Collection Time   10/09/11  1:55 AM      Component Value Range Status Comment   MRSA by PCR NEGATIVE  NEGATIVE Final      Studies: Ct Head Wo Contrast  10/08/2011  *RADIOLOGY REPORT*  Clinical Data: Status post embolization of anterior communicating artery aneurysm after subarachnoid hemorrhage.  CT HEAD WITHOUT CONTRAST  Technique:  Contiguous axial images were obtained from the base of the skull through the vertex without contrast.  Comparison: 09/21/2011  Findings: Stable coarse and positioning of ventriculostomy catheter.  No evidence of hydrocephalus or intraventricular blood. Evolving encephalomalacia in the left ACA territory and inferior right frontal lobe without progressive infarction since the prior study.  No evidence of acute hemorrhage, extra-axial fluid collections or mass effect.  The skull is unremarkable.  Aneurysm coils again identified.  IMPRESSION: No acute findings.  Evolving encephalomalacia in the ACA territory of the brain.   Original Report Authenticated By: Reola Calkins, M.D.     Ct Chest Wo Contrast  10/08/2011  *RADIOLOGY REPORT*  Clinical Data: Fever.  CT CHEST WITHOUT CONTRAST  Technique:  Multidetector CT imaging of the chest was performed following the standard protocol without IV contrast.  Comparison: Chest x-ray earlier today.  Findings: There is cardiomegaly.  Bibasilar atelectasis present. No pleural effusions.  Aorta is tortuous, non-aneurysmal. No mediastinal, hilar, or axillary adenopathy.  Visualized thyroid and chest wall soft tissues unremarkable. Imaging into the upper abdomen shows no acute findings.   No acute bony abnormality.  IMPRESSION: Cardiomegaly, bibasilar atelectasis.   Original Report  Authenticated By: Cyndie Chime, M.D.    Dg Chest Portable 1 View  10/08/2011  *RADIOLOGY REPORT*  Clinical Data: Seizure.  Fever.  PORTABLE CHEST - 1 VIEW  Comparison: 09/03/2011  Findings: Artifact overlies chest.  Shunt tube overlies the chest. There is a poor inspiration.  Basilar atelectasis or infiltrate is not excluded.  Upper lungs are clear.  No bony abnormalities seen.  IMPRESSION: Poor inspiration.  Cannot rule out atelectasis or infiltrate at the lung bases.  Otherwise negative.   Original Report Authenticated By: Thomasenia Sales, M.D.     Scheduled Meds:    . antiseptic oral rinse  15 mL Mouth Rinse q12n4p  . cefTRIAXone (ROCEPHIN)  IV  2 g Intravenous BID  . chlorhexidine  15 mL Mouth/Throat BID  . insulin aspart  0-9 Units Subcutaneous Q4H  . labetalol  200 mg Oral TID  . labetalol  10 mg Intravenous Once  . levetiracetam  500 mg Intravenous BID  . levofloxacin (LEVAQUIN) IV  500 mg Intravenous QHS  . lisinopril  10 mg Oral Daily  . pantoprazole (PROTONIX) IV  40 mg Intravenous Q24H  . sodium chloride  1,000 mL Intravenous Once  . sodium chloride  500 mL Intravenous Once  . sodium chloride  3 mL Intravenous Q12H  . vancomycin  1,000 mg Intravenous Q12H   Continuous Infusions:    . 0.9 % NaCl with KCl 20 mEq / L 100 mL/hr at 10/10/11  0010    Principal Problem:  *Fever Active Problems:  HTN (hypertension)  SAH (subarachnoid hemorrhage) 08/16/11  Seizure disorder, seizure 09/21/11 while in rehab  VP (ventriculoperitoneal) shunt status  Diabetes mellitus, type 2    Time spent: > 35 minutes    Penny Pia  [facility] Pager [phone] If 8PM-8AM, please contact night-coverage at [email or web address], password [facility] 10/10/2011, 10:07 AM  LOS: 2 days

## 2011-10-10 NOTE — Progress Notes (Signed)
Clinical Social Work Department BRIEF PSYCHOSOCIAL ASSESSMENT 10/10/2011  Patient:  [name], [name]     Account Number:  [id number]     Admit date:  10/08/2011  Clinical Social Worker:  Dennison Bulla  Date/Time:  10/10/2011 04:00 PM  Referred by:  Physician  Date Referred:  10/10/2011 Referred for  SNF Placement   Other Referral:   Interview type:  Patient Other interview type:    PSYCHOSOCIAL DATA Living Status:  FACILITY Admitted from facility:  [facility] Level of care:  Skilled Nursing Facility Primary support name:  Daphne Primary support relationship to patient:  SIBLING Degree of support available:   Strong    CURRENT CONCERNS Current Concerns  Post-Acute Placement     Other Concerns:    SOCIAL WORK ASSESSMENT / PLAN CSW received referral to assist with family regarding plans at dc. CSW reviewed chart which stated patient was admitted from [facility]. CSW met with patient and sister at dc. Patient has delayed responses and deferred most answers to sister.  Patient was recently admitted to the hospital in September and went to [facility] for about 4 weeks and then was discharged to [facility]. Patient has only been at [facility] for a few days. Sister reports that she feels another placement would be more beneficial for patient. Sister has researched options and has been speaking with family regarding placement. Sister is interested in [facility] search and specifically mentioned [employer], [address], [address], [address] [facility] [place], [address], [place], [place] or [place] at [employer]. CSW attempted to call husband to verify information but the phone rang and CSW unable to leave a message. CSW answered sister's questions who agreed to inform family. CSW provided sister with SNF list and explained process.   CSW completed FL2 and faxed out to [facility]. CSW will follow up with bed offers.  Assessment/plan status:   Other assessment/  plan:   Information/referral to community resources:  SNF list  PATIENT'S/FAMILY'S RESPONSE TO PLAN OF CARE:  Patient struggled with participating in assessment. Per sister, patient has been disengaged lately and needs therapy to improve speech. Sister and family interested in placement in [place]. Sister appreciative of CSW consult and has CSW contact information.

## 2011-10-10 NOTE — Progress Notes (Signed)
INITIAL ADULT NUTRITION ASSESSMENT Date: 10/10/2011   Time: 1:44 PM  Reason for Assessment: Malnutrition Screening  INTERVENTION: 1. Ensure Pudding po TID, each supplement provides 170 kcal and 4 grams of protein.  2. MVI daily 3. RD to continue to follow nutrition care plan  DOCUMENTATION CODES Per approved criteria  -Obesity Unspecified   ASSESSMENT: Female 61 y.o.  Dx: Fever  Hx:  Past Medical History  Diagnosis Date  . Hypertension    Past Surgical History  Procedure Date  . Abdominal hysterectomy   . Ventriculoperitoneal shunt 09/02/2011    Procedure: SHUNT INSERTION VENTRICULAR-PERITONEAL;  Surgeon: Maeola Harman, MD;  Location: [facility];  Service: Neurosurgery;  Laterality: Right;   Related Meds:     . antiseptic oral rinse  15 mL Mouth Rinse q12n4p  . cefTRIAXone (ROCEPHIN)  IV  2 g Intravenous BID  . chlorhexidine  15 mL Mouth/Throat BID  . insulin aspart  0-9 Units Subcutaneous Q4H  . labetalol  200 mg Oral TID  . labetalol  10 mg Intravenous Once  . levetiracetam  500 mg Intravenous BID  . levofloxacin (LEVAQUIN) IV  500 mg Intravenous QHS  . lisinopril  10 mg Oral Daily  . pantoprazole (PROTONIX) IV  40 mg Intravenous Q24H  . sodium chloride  1,000 mL Intravenous Once  . sodium chloride  500 mL Intravenous Once  . sodium chloride  3 mL Intravenous Q12H  . vancomycin  1,000 mg Intravenous Q12H   Ht: 5\' 5"  (165.1 cm)  Wt: 216 lb 0.8 oz (98 kg)  Ideal Wt: 125 lb/56.8 kg % Ideal Wt: 173%  Wt Readings from Last 15 Encounters:  10/10/11 216 lb 0.8 oz (98 kg)  10/04/11 203 lb 14.8 oz (92.5 kg)  09/07/11 217 lb 9.5 oz (98.7 kg)  09/07/11 217 lb 9.5 oz (98.7 kg)  09/07/11 217 lb 9.5 oz (98.7 kg)  07/08/11 222 lb 7 oz (100.897 kg)  Usual Wt: 222 lb % Usual Wt: 97%  Body mass index is 35.95 kg/(m^2). Obese Class II  Food/Nutrition Related Hx: pt recently d/c'd from inpatient rehab; pt initially had poor PO intake at that time, but prior to d/c,  she was eating better  Labs: . CMP     Component Value Date/Time   NA 144 10/10/2011 0656   K 3.3* 10/10/2011 0656   CL 109 10/10/2011 0656   CO2 23 10/10/2011 0656   GLUCOSE 111* 10/10/2011 0656   BUN 24* 10/10/2011 0656   CREATININE 1.08 10/10/2011 0656   CALCIUM 9.1 10/10/2011 0656   PROT 7.5 10/08/2011 1856   ALBUMIN 2.7* 10/08/2011 1856   AST 42* 10/08/2011 1856   ALT 29 10/08/2011 1856   ALKPHOS 129* 10/08/2011 1856   BILITOT 1.1 10/08/2011 1856   GFRNONAA 53* 10/10/2011 0656   GFRAA 62* 10/10/2011 0656   No recent magnesium or phosphorus.   Intake/Output Summary (Last 24 hours) at 10/10/11 1349 Last data filed at 10/10/11 1300  Gross per 24 hour  Intake   2510 ml  Output   1325 ml  Net   1185 ml   Diet Order: Carb Control Medium (1600 - 2000)  Supplements/Tube Feeding: none  IVF:    0.9 % NaCl with KCl 20 mEq / L Last Rate: 100 mL/hr at 10/10/11 0010   Estimated Nutritional Needs:   Kcal:  1600 - 1800 kcal Protein:  115 - 130 grams protein Fluid:  1.8 - 2 liters daily  Pt was recently d/c'd from  inpatient rehab at [facility]. Pt was discharged from inpatient rehab to SNF. Pt had seizure while at SNF. Also per family, they noted that while at SNF, she had a change in status - no longer walking, eating poorly, and barely speaking. Work-up also reveals UTI.  No family at bedside. Pt unable to elaborate on PO intake at facility.  Suspect some level of malnutrition given recent dramatic decline in PO intake. Per chart, weight has remained fairly stable.  RN reports that patient received juice for lunch and vomited it. RN stated that she gave pt minimal bites to eat to help slowly advance and promote better tolerance of diet.  Skin: No skin breakdown noted. Notable labs: potassium is low @ 3.3; CBG's 92 - 100 Pertinent meds: protonix Last BM: 10/3  NUTRITION DIAGNOSIS: Inadequate oral intake r/t AMS AEB chart review and RN  report.  MONITORING/EVALUATION(Goals): Goal: Pt to meet >/= 90% of their estimated nutrition needs Monitor: weight trends, lab trends, I/O's, PO intake, supplement tolerance  EDUCATION NEEDS: -No education needs identified at this time  Jarold Motto MS, RD, LDN Pager: [phone] After-hours pager: [phone]

## 2011-10-10 NOTE — Progress Notes (Signed)
PT Cancellation Note  Patient Details Name: [name] MRN: [id number] DOB: 11/30/1950   Cancelled Treatment:     BP cont's to be elevated.  Will re-attempt tomorrow.      Verdell Face, [place] [phone] 10/10/2011

## 2011-10-10 NOTE — Progress Notes (Addendum)
Clinical Social Work Department CLINICAL SOCIAL WORK PLACEMENT NOTE 10/10/2011  Patient:  [name], [name]  Account Number:  [id number] Admit date:  10/08/2011  Clinical Social Worker:  Unk Lightning, LCSW  Date/time:  10/10/2011 02:00 PM  Clinical Social Work is seeking post-discharge placement for this patient at the following level of care:   SKILLED NURSING   (*CSW will update this form in Epic as items are completed)   10/10/2011  Patient/family provided with [facility] Department of Clinical Social Work's list of facilities offering this level of care within the geographic area requested by the patient (or if unable, by the patient's family).  10/10/2011  Patient/family informed of their freedom to choose among providers that offer the needed level of care, that participate in Medicare, Medicaid or managed care program needed by the patient, have an available bed and are willing to accept the patient.  10/10/2011  Patient/family informed of [facility]' ownership interest in [facility], as well as of the fact that they are under no obligation to receive care at this facility.  PASARR submitted to EDS on  PASARR number received from EDS on   FL2 transmitted to all facilities in geographic area requested by pt/family on  10/10/2011 FL2 transmitted to all facilities within larger geographic area on   Patient informed that his/her managed care company has contracts with or will negotiate with  certain facilities, including the following:     Patient/family informed of bed offers received:  10/17/2011 Patient chooses bed at [facility] Physician recommends and patient chooses bed at  [facility]  Patient to be transferred to [facility] on 10/18/2011 Patient to be transferred to facility by [facility]  The following physician request were entered in Epic:   Additional Comments:

## 2011-10-10 NOTE — Progress Notes (Deleted)
OT Cancellation Note  Patient Details Name: [name] MRN: [id number] DOB: 10-Jun-1950   Cancelled Treatment:    Reason Eval/Treat Not Completed: Medical issues which prohibited therapy - Hbg 7.4 and BP is soft. Pt awaiting transfusion.  Will re-attempt  Jeani Hawking M [phone] 10/10/2011, 11:11 AM

## 2011-10-11 DIAGNOSIS — R638 Other symptoms and signs concerning food and fluid intake: Secondary | ICD-10-CM | POA: Diagnosis present

## 2011-10-11 LAB — URINE CULTURE: Culture: NO GROWTH

## 2011-10-11 MED ORDER — POTASSIUM CHLORIDE CRYS ER 20 MEQ PO TBCR
40.0000 meq | EXTENDED_RELEASE_TABLET | Freq: Once | ORAL | Status: AC
  Administered 2011-10-11: 40 meq via ORAL
  Filled 2011-10-11: qty 2

## 2011-10-11 MED ORDER — ONDANSETRON HCL 4 MG/2ML IJ SOLN
4.0000 mg | Freq: Four times a day (QID) | INTRAMUSCULAR | Status: DC | PRN
  Administered 2011-10-11 – 2011-10-15 (×2): 4 mg via INTRAVENOUS
  Filled 2011-10-11 (×2): qty 2

## 2011-10-11 MED ORDER — PNEUMOCOCCAL VAC POLYVALENT 25 MCG/0.5ML IJ INJ
0.5000 mL | INJECTION | INTRAMUSCULAR | Status: AC
  Administered 2011-10-12: 0.5 mL via INTRAMUSCULAR
  Filled 2011-10-11: qty 0.5

## 2011-10-11 MED ORDER — KCL IN DEXTROSE-NACL 20-5-0.45 MEQ/L-%-% IV SOLN
INTRAVENOUS | Status: DC
  Administered 2011-10-11 – 2011-10-15 (×6): via INTRAVENOUS
  Filled 2011-10-11 (×8): qty 1000

## 2011-10-11 MED ORDER — INFLUENZA VIRUS VACC SPLIT PF IM SUSP
0.5000 mL | INTRAMUSCULAR | Status: AC
  Administered 2011-10-12: 0.5 mL via INTRAMUSCULAR
  Filled 2011-10-11: qty 0.5

## 2011-10-11 MED ORDER — MEGESTROL ACETATE 40 MG PO TABS
40.0000 mg | ORAL_TABLET | Freq: Every day | ORAL | Status: DC
  Administered 2011-10-11 – 2011-10-12 (×2): 40 mg via ORAL
  Filled 2011-10-11 (×3): qty 1

## 2011-10-11 MED ORDER — LEVOFLOXACIN 500 MG PO TABS
500.0000 mg | ORAL_TABLET | Freq: Every day | ORAL | Status: DC
  Administered 2011-10-11 – 2011-10-13 (×3): 500 mg via ORAL
  Filled 2011-10-11 (×3): qty 1

## 2011-10-11 NOTE — Progress Notes (Signed)
Physical Therapy Treatment Patient Details Name: [name] MRN: [id number] DOB: [birth year] Today's Date: 10/11/2011 Time: 9528-4132 PT Time Calculation (min): 26 min  PT Assessment / Plan / Recommendation Comments on Treatment Session  Pt progressing with PT goals & mobility at this date however would cont to benefit from further post-acute rehab before d/c'ing home to maximize safety, strength, & functional mobility.      Follow Up Recommendations  Post acute inpatient     Does the patient have the potential to tolerate intense rehabilitation  No, Recommend SNF  Barriers to Discharge        Equipment Recommendations  None recommended by OT    Recommendations for Other Services    Frequency Min 3X/week   Plan Discharge plan remains appropriate    Precautions / Restrictions Precautions Precautions: Fall Restrictions Weight Bearing Restrictions: No       Mobility  Bed Mobility Bed Mobility: Supine to Sit;Sitting - Scoot to Edge of Bed Supine to Sit: 4: Min guard Sitting - Scoot to [place] of Bed: 6: Modified independent (Device/Increase time) Sit to Supine: 4: Min assist;With rail;HOB flat Details for Bed Mobility Assistance: Pt. required min A to initiate moving to EOB Transfers Transfers: Sit to Stand;Stand to Sit Sit to Stand: 4: Min assist;With upper extremity assist Stand to Sit: 4: Min assist;With upper extremity assist;To bed Details for Transfer Assistance: cues for hand placement & technique.   Ambulation/Gait Ambulation/Gait Assistance: 4: Min guard;4: Min Environmental consultant (Feet): 120 Feet Assistive device: Rolling walker Ambulation/Gait Assistance Details: Pt with shuffling steps initially but improved with distance.  Cues for increased awareness of obstacles & environment.  Tends to veer off straight line.  Assist for balance towards end of distance due to LE's buckling.   Gait Pattern: Step-through pattern;Decreased stride length;Shuffle Gait  velocity: Slow gait speed Stairs: No Wheelchair Mobility Wheelchair Mobility: No      PT Goals Acute Rehab PT Goals Time For Goal Achievement: 10/16/11 Potential to Achieve Goals: Good Pt will go Sit to Stand: with supervision PT Goal: Sit to Stand - Progress: Progressing toward goal Pt will go Stand to Sit: with supervision PT Goal: Stand to Sit - Progress: Progressing toward goal Pt will Transfer Bed to Chair/Chair to Bed: with supervision Pt will Ambulate: >150 feet;with supervision;with rolling walker PT Goal: Ambulate - Progress: Progressing toward goal  Visit Information  Last PT Received On: 10/11/11 Assistance Needed: +2 (safety)    Subjective Data      Cognition  Overall Cognitive Status: Impaired Area of Impairment: Attention;Memory;Following commands;Safety/judgement;Awareness of deficits;Problem solving Arousal/Alertness: Awake/alert Orientation Level: Other (comment) (pt with minimal verbalization- difficulty to assess) Behavior During Session: Flat affect Current Attention Level: Selective Memory Deficits: cues to recall information re: daily events Following Commands: Follows one step commands consistently Safety/Judgement: Decreased safety judgement for tasks assessed;Impulsive;Decreased awareness of need for assistance    Balance     End of Session PT - End of Session Equipment Utilized During Treatment: Gait belt Activity Tolerance: Patient tolerated treatment well;Patient limited by fatigue Patient left: in chair;with call bell/phone within reach;with family/visitor present Nurse Communication: Mobility status     Verdell Face, [place] [phone] 10/11/2011

## 2011-10-11 NOTE — Discharge Summary (Signed)
As above.

## 2011-10-11 NOTE — Progress Notes (Signed)
As above.  No need to tap shunt unless patient develops meningismus and/or fever after UTI resolved and off antibiotics.  Please call with questions/concerns.

## 2011-10-11 NOTE — Progress Notes (Signed)
Subjective: Patient reports "I've been a little nauseated"  Objective: Vital signs in last 24 hours: Temp:  [99 F (37.2 C)-99.6 F (37.6 C)] 99.6 F (37.6 C) (10/08 0341) Pulse Rate:  [72-91] 72  (10/08 0341) Resp:  [21-30] 23  (10/08 0341) BP: (132-194)/(63-115) 144/84 mmHg (10/08 0341) SpO2:  [92 %-99 %] 98 % (10/08 0341) Weight:  [102.9 kg (226 lb 13.7 oz)] 102.9 kg (226 lb 13.7 oz) (10/08 0500)  Intake/Output from previous day: 10/07 0701 - 10/08 0700 In: 4875 [I.V.:2250; IV Piggyback:800] Out: 100 [Emesis/NG output:100] Intake/Output this shift:    Awakens to voice. Husband present. Alert & oriented x3. Doesn't remember cause for this admission. Denies pain/discomfort, specifically denies headache or abdominal pain. Shunt sites well healed without erythema or swelling. Sutures found to be intact behind right ear - removed. Abd nontender to palpation, incisions well healed.    Lab Results:  [facility] 10/10/11 0656 10/08/11 1856  WBC 9.9 7.7  HGB 9.7* 11.4*  HCT 30.8* 35.8*  PLT 85* 92*   BMET  Basename 10/10/11 0656 10/08/11 1856  NA 144 137  K 3.3* 3.3*  CL 109 101  CO2 23 23  GLUCOSE 111* 188*  BUN 24* 27*  CREATININE 1.08 1.27*  CALCIUM 9.1 9.6    Studies/Results: No results found.  Assessment/Plan:   LOS: 3 days  Likelyhood of vp shunt infection low, will continue to follow.    Georgiann Cocker 10/11/2011, 8:14 AM

## 2011-10-11 NOTE — Progress Notes (Signed)
[facility] PROGRESS NOTE  [name] AVW:[id number] DOB: 09-11-1950 DOA: 10/08/2011 PCP: No primary provider on file.  Assessment/Plan: 1. H/o subarachnoid hemorrhage with hydrocephalus: Dr. Phoebe Perch (neurosurgery) on board and will follow up with his recommendations.  CT of head on 10/08/11 reported no acute findings.  Did report however evolving encephalomalacia in the ACA territory.  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 Corliss Skains on 08/19/11.  Per Neurosurgery likely hood of VP shunt infection low 2. Seizure d/o: Recommendations per neurology.  I have reviewed their recommendations and we will continue the Keppra as mentioned in their note.  Should patient have any more seizure like activity will plan on contacting neurology for further recommendations. 3. UTI:  U/A on 10/05 showed hazy urine with large hgb and many bacteria.  Urine culture with no growth.  Will plan on continuing levaquin at this juncture 10/08 and discontinue vancomycin and rocephin. 4. DM: Blood sugars relatively well controlled at this point.  Will continue SSI and will advance diet to diabetic diet 10/10/11.   5. Hypertension:  Home blood pressure medication was held initially most likely due to lower blood pressure readings on admission.  At this point patient has required hydralazine and labetalol prn.  Will go ahead and add patient's home blood pressure medication regimen.  Will continue the hydralazine prn elevated blood pressures.  Discussed with nursing.  Pt's BP improved on home regimen and last value reported 144/84 6. Decreased oral intake:  At this point will place on 1/2 MIVF.  Will administer megace to help with appetite and advance diet as tolerated.  Code Status: Full Disposition Plan: Pending improvement in clinical condition and cessation of fevers.     Consultants:  Neurosurgery: Dr. Phoebe Perch  Neurology: Dr. Otelia Limes  Procedures:  None  Antibiotics:  Levaquin started  10/5  Vancomycin started 10/5 d/c'd 10/08  ceftriaxone started 10/5 d/c'd 10/08  HPI/Subjective: No new complaints today.  No new seizure like activity reported.  Patient denies any fevers, nausea, abdominal pain, HA's, blurred vision.  Objective: Filed Vitals:   10/10/11 2354 10/11/11 0341 10/11/11 0500 10/11/11 0800  BP: 132/63 144/84  133/119  Pulse: 77 72  86  Temp: 99.4 F (37.4 C) 99.6 F (37.6 C)  100.4 F (38 C)  TempSrc: Oral Oral  Oral  Resp: 24 23  24   Height:      Weight:   102.9 kg (226 lb 13.7 oz)   SpO2: 99% 98%  94%    Intake/Output Summary (Last 24 hours) at 10/11/11 0921 Last data filed at 10/11/11 0800  Gross per 24 hour  Intake   4677 ml  Output    100 ml  Net   4577 ml   Filed Weights   10/09/11 0127 10/10/11 0444 10/11/11 0500  Weight: 95.5 kg (210 lb 8.6 oz) 98 kg (216 lb 0.8 oz) 102.9 kg (226 lb 13.7 oz)    Exam:   General:  Pt in NAD, resting supine in bed  Cardiovascular: RRR, No MRG  Respiratory: CTA BL, no increased work of breathing  Abdomen: soft, nt, nd  Data Reviewed: Basic Metabolic Panel:  Lab 10/10/11 8295 10/08/11 1856  NA 144 137  K 3.3* 3.3*  CL 109 101  CO2 23 23  GLUCOSE 111* 188*  BUN 24* 27*  CREATININE 1.08 1.27*  CALCIUM 9.1 9.6  MG -- --  PHOS -- --   Liver Function Tests:  Lab 10/08/11 1856  AST 42*  ALT 29  ALKPHOS 129*  BILITOT 1.1  PROT 7.5  ALBUMIN 2.7*   No results found for this basename: LIPASE:5,AMYLASE:5 in the last 168 hours No results found for this basename: AMMONIA:5 in the last 168 hours CBC:  Lab 10/10/11 0656 10/08/11 1856  WBC 9.9 7.7  NEUTROABS -- 7.0  HGB 9.7* 11.4*  HCT 30.8* 35.8*  MCV 82.1 83.1  PLT 85* 92*   Cardiac Enzymes: No results found for this basename: CKTOTAL:5,CKMB:5,CKMBINDEX:5,TROPONINI:5 in the last 168 hours BNP (last 3 results) No results found for this basename: PROBNP:3 in the last 8760 hours CBG:  Lab 10/11/11 0851 10/11/11 0340 10/10/11  2353 10/10/11 2002 10/10/11 1531  GLUCAP 109* 120* 149* 158* 125*    Recent Results (from the past 240 hour(s))  CULTURE, BLOOD (ROUTINE X 2)     Status: Normal (Preliminary result)   Collection Time   10/08/11  6:56 PM      Component Value Range Status Comment   Specimen Description LEFT ANTECUBITAL   Final    Special Requests BOTTLES DRAWN AEROBIC AND ANAEROBIC 6CC   Final    Culture NO GROWTH 2 DAYS   Final    Report Status PENDING   Incomplete   CULTURE, BLOOD (ROUTINE X 2)     Status: Normal (Preliminary result)   Collection Time   10/08/11  7:15 PM      Component Value Range Status Comment   Specimen Description BLOOD LEFT HAND   Final    Special Requests BOTTLES DRAWN AEROBIC AND ANAEROBIC 6CC   Final    Culture NO GROWTH 2 DAYS   Final    Report Status PENDING   Incomplete   URINE CULTURE     Status: Normal   Collection Time   10/08/11  8:04 PM      Component Value Range Status Comment   Specimen Description URINE, CATHETERIZED   Final    Special Requests NONE   Final    Culture  Setup Time 10/09/2011 21:43   Final    Colony Count NO GROWTH   Final    Culture NO GROWTH   Final    Report Status 10/11/2011 FINAL   Final   MRSA PCR SCREENING     Status: Normal   Collection Time   10/09/11  1:55 AM      Component Value Range Status Comment   MRSA by PCR NEGATIVE  NEGATIVE Final      Studies: No results found.  Scheduled Meds:    . antiseptic oral rinse  15 mL Mouth Rinse q12n4p  . chlorhexidine  15 mL Mouth/Throat BID  . feeding supplement  1 Container Oral TID BM  . insulin aspart  0-9 Units Subcutaneous Q4H  . labetalol  200 mg Oral TID  . levetiracetam  500 mg Intravenous BID  . levofloxacin  500 mg Oral Daily  . lisinopril  10 mg Oral Daily  . multivitamin with minerals  1 tablet Oral Daily  . pantoprazole (PROTONIX) IV  40 mg Intravenous Q24H  . potassium chloride  40 mEq Oral Once  . sodium chloride  1,000 mL Intravenous Once  . sodium chloride  500 mL  Intravenous Once  . sodium chloride  3 mL Intravenous Q12H  . DISCONTD: cefTRIAXone (ROCEPHIN)  IV  2 g Intravenous BID  . DISCONTD: levofloxacin (LEVAQUIN) IV  500 mg Intravenous QHS  . DISCONTD: vancomycin  1,000 mg Intravenous Q12H   Continuous Infusions:    .  dextrose 5 % and 0.45 % NaCl with KCl 20 mEq/L    . DISCONTD: 0.9 % NaCl with KCl 20 mEq / L 100 mL/hr at 10/10/11 2153    Principal Problem:  *Fever Active Problems:  HTN (hypertension)  SAH (subarachnoid hemorrhage) 08/16/11  Seizure disorder, seizure 09/21/11 while in rehab  VP (ventriculoperitoneal) shunt status  Diabetes mellitus, type 2  UTI (lower urinary tract infection)    Time spent: > 35 minutes    Penny Pia  [facility] Pager [phone] If 8PM-8AM, please contact night-coverage at [email or web address], password [facility] 10/11/2011, 9:21 AM  LOS: 3 days

## 2011-10-11 NOTE — Evaluation (Signed)
Occupational Therapy Evaluation Patient Details Name: [name] MRN: [id number] DOB: [birth year] Today's Date: 10/11/2011 Time: 4098-1191 OT Time Calculation (min): 28 min  OT Assessment / Plan / Recommendation Clinical Impression  This 61 y.o. female recently discharged from CIR to NH due to [facility] with hydrocephalus.  Pt. re-admitted with UTI and seizure.  Pt. demonstrates the below listed deficits and will benefit from OT to maximize safety and independence with BADLs.  Recommend return to SNF at discharge    OT Assessment  Patient needs continued OT Services    Follow Up Recommendations  Skilled nursing facility    Barriers to Discharge Decreased caregiver support    Equipment Recommendations  None recommended by OT    Recommendations for Other Services    Frequency  Min 2X/week    Precautions / Restrictions Precautions Precautions: Fall Restrictions Weight Bearing Restrictions: No       ADL  Eating/Feeding: Simulated;Supervision/safety Where Assessed - Eating/Feeding: Bed level;Chair Grooming: Simulated;Wash/dry hands;Wash/dry face;Supervision/safety Where Assessed - Grooming: Unsupported sitting Upper Body Bathing: Simulated;Minimal assistance Where Assessed - Upper Body Bathing: Unsupported sitting Lower Body Bathing: Simulated;Minimal assistance Where Assessed - Lower Body Bathing: Supported sit to stand Upper Body Dressing: Simulated;Minimal assistance Where Assessed - Upper Body Dressing: Unsupported sitting Lower Body Dressing: Simulated;Performed;Minimal assistance Where Assessed - Lower Body Dressing: Supported sit to stand Toilet Transfer: Simulated;Minimal assistance Toilet Transfer Method: Sit to Barista: Comfort height toilet Toileting - Clothing Manipulation and Hygiene: Simulated;Minimal assistance Where Assessed - Engineer, mining and Hygiene: Standing Equipment Used: Rolling walker Transfers/Ambulation  Related to ADLs: Pt. requires min A and +1 for lines with ambulation.  Pt. veers to Rt. toward wall.   ADL Comments: Pt. is slow to initiate activity.  Pt. demonstrates dyspnea 3/4 when performing LB ADLs    OT Diagnosis: Generalized weakness;Cognitive deficits  OT Problem List: Decreased strength;Decreased activity tolerance;Impaired balance (sitting and/or standing);Decreased cognition;Decreased knowledge of use of DME or AE;Decreased safety awareness;Cardiopulmonary status limiting activity OT Treatment Interventions: Self-care/ADL training;Therapeutic activities;Patient/family education;Balance training;Cognitive remediation/compensation   OT Goals Acute Rehab OT Goals OT Goal Formulation: With patient Time For Goal Achievement: 10/25/11 Potential to Achieve Goals: Good ADL Goals Pt Will Perform Grooming: with supervision;Standing at sink ADL Goal: Grooming - Progress: Goal set today Pt Will Perform Upper Body Bathing: with supervision;Sitting, chair;Sitting, edge of bed ADL Goal: Upper Body Bathing - Progress: Goal set today Pt Will Perform Lower Body Bathing: with supervision;Sit to stand from bed;Sit to stand from chair ADL Goal: Lower Body Bathing - Progress: Goal set today Pt Will Transfer to Toilet: with supervision;Ambulation;Comfort height toilet ADL Goal: Toilet Transfer - Progress: Goal set today  Visit Information  Last OT Received On: 10/11/11 PT/OT Co-Evaluation/Treatment: Yes    Subjective Data  Subjective: Pt with minimal verbalization Patient Stated Goal: Pt. did not state   Prior Functioning     Home Living Lives With: Other (Comment) (From [facility] in [place]) Available Help at Discharge: Skilled Nursing Facility Type of Home: House Home Access: Stairs to enter [employer] of Steps: 3 Entrance Stairs-Rails: None Home Layout: One level Bathroom Shower/Tub: Tub/shower unit;Door [employer] Toilet: Standard Bathroom Accessibility: Yes How  Accessible: Accessible via wheelchair;Accessible via walker Home Adaptive Equipment: None Additional Comments: Pt. discharged from CIR to [facility] Prior Function Level of Independence: Needs assistance Needs Assistance: Bathing;Dressing;Meal Prep;Light Housekeeping;Gait Bath: Moderate Dressing: Moderate Meal Prep: Total Light Housekeeping: Total Gait Assistance: Min assist with RW at d/c from CIR Able  to Take Stairs?: No Driving: No Vocation: Part time employment Communication Communication: Expressive difficulties (minimal verbalization) Dominant Hand: Right         Vision/Perception Vision - Assessment Eye Alignment: Within Functional Limits Vision Assessment: Vision not tested Perception Perception: Within Functional Limits   Cognition  Overall Cognitive Status: Impaired Area of Impairment: Attention;Memory;Following commands;Safety/judgement;Awareness of deficits;Problem solving Arousal/Alertness: Awake/alert Orientation Level: Other (comment) (pt. with minimal verbalization - difficult to asses) Behavior During Session: Flat affect Current Attention Level: Selective Memory Deficits: cues to recall information re: daily events Following Commands: Follows one step commands consistently Safety/Judgement: Decreased safety judgement for tasks assessed;Impulsive;Decreased awareness of need for assistance    Extremity/Trunk Assessment Right Upper Extremity Assessment RUE ROM/Strength/Tone: WFL for tasks assessed RUE Coordination: WFL - gross/fine motor Left Upper Extremity Assessment LUE ROM/Strength/Tone: WFL for tasks assessed LUE Coordination: WFL - gross/fine motor     Mobility Bed Mobility Bed Mobility: Supine to Sit;Sitting - Scoot to Edge of Bed Supine to Sit: 4: Min guard Sitting - Scoot to [place] of Bed: 6: Modified independent (Device/Increase time) Sit to Supine: 4: Min assist;With rail;HOB flat Details for Bed Mobility Assistance: Pt. required min A to  initiate moving to EOB Transfers Transfers: Sit to Stand;Stand to Sit Sit to Stand: 4: Min assist;With upper extremity assist Stand to Sit: 4: Min assist;With upper extremity assist;To bed Details for Transfer Assistance: Pt. veers to Rt. when ambulating     Shoulder Instructions     Exercise     Balance     End of Session OT - End of Session Activity Tolerance: Patient tolerated treatment well Patient left: in chair;with call bell/phone within reach Nurse Communication: Mobility status  GO     Dani Danis M 10/11/2011, 1:07 PM

## 2011-10-11 NOTE — Progress Notes (Signed)
Clinical Social Work  CSW called sister and left a message regarding [employer]. CSW called husband but phone rings and unable to leave a message. CSW left a message with CSW contact information. CSW will continue to follow.  [place], [place] [phone]

## 2011-10-12 LAB — CBC
HCT: 28.4 % — ABNORMAL LOW (ref 36.0–46.0)
MCV: 81.6 fL (ref 78.0–100.0)
RDW: 17.5 % — ABNORMAL HIGH (ref 11.5–15.5)
WBC: 11.5 10*3/uL — ABNORMAL HIGH (ref 4.0–10.5)

## 2011-10-12 LAB — GLUCOSE, CAPILLARY
Glucose-Capillary: 108 mg/dL — ABNORMAL HIGH (ref 70–99)
Glucose-Capillary: 136 mg/dL — ABNORMAL HIGH (ref 70–99)
Glucose-Capillary: 105 mg/dL — ABNORMAL HIGH (ref 70–99)
Glucose-Capillary: 143 mg/dL — ABNORMAL HIGH (ref 70–99)

## 2011-10-12 MED ORDER — LEVETIRACETAM 500 MG PO TABS
500.0000 mg | ORAL_TABLET | Freq: Two times a day (BID) | ORAL | Status: DC
  Administered 2011-10-12 – 2011-10-18 (×12): 500 mg via ORAL
  Filled 2011-10-12 (×13): qty 1

## 2011-10-12 MED ORDER — PANTOPRAZOLE SODIUM 40 MG PO TBEC
40.0000 mg | DELAYED_RELEASE_TABLET | Freq: Every day | ORAL | Status: DC
  Administered 2011-10-12 – 2011-10-17 (×6): 40 mg via ORAL
  Filled 2011-10-12 (×5): qty 1

## 2011-10-12 MED ORDER — BOOST / RESOURCE BREEZE PO LIQD
1.0000 | Freq: Three times a day (TID) | ORAL | Status: DC
  Administered 2011-10-12 – 2011-10-15 (×2): 1 via ORAL

## 2011-10-12 NOTE — Progress Notes (Signed)
Subjective: Patient reports (Sleeping soundly)  Objective: Vital signs in last 24 hours: Temp:  [97.7 F (36.5 C)-99.6 F (37.6 C)] 98.6 F (37 C) (10/09 0800) Pulse Rate:  [71-81] 74  (10/09 0800) Resp:  [18-28] 18  (10/09 0800) BP: (133-184)/(60-96) 164/79 mmHg (10/09 0800) SpO2:  [96 %-100 %] 96 % (10/09 0800) Weight:  [99.9 kg (220 lb 3.8 oz)] 99.9 kg (220 lb 3.8 oz) (10/09 0429)  Intake/Output from previous day: 10/08 0701 - 10/09 0700 In: 2042 [P.O.:240; I.V.:1200; IV Piggyback:202] Out: 1125 [Urine:1125] Intake/Output this shift: Total I/O In: 100 [I.V.:100] Out: -   Sleeping. Husband present reports pt very restless all night, only c/o right hip pain.   Lab Results:  Basename 10/12/11 0600 10/10/11 0656  WBC 11.5* 9.9  HGB 9.0* 9.7*  HCT 28.4* 30.8*  PLT 85* 85*   BMET  Basename 10/10/11 0656  NA 144  K 3.3*  CL 109  CO2 23  GLUCOSE 111*  BUN 24*  CREATININE 1.08  CALCIUM 9.1    Studies/Results: No results found.  Assessment/Plan: No longer febrile  LOS: 4 days  Continue medical mgt.    Georgiann Cocker 10/12/2011, 8:38 AM

## 2011-10-12 NOTE — Progress Notes (Signed)
Clinical Social Work  CSW met with patient, husband and sister at bedside. CSW explained that only bed offer at this time is at [facility]. Sister and husband agreed to tour facility before making any decisions. [facility] is considering. Husband is interested in [place] search. Before choosing [facility], husband feels that [facility] offered a bed. CSW and family discussed possibly of returning to [facility] if family does not desire [facility] and no other options. Family refuses [facility] and reports they will talk with all of family before making any decisions. CSW expanded search to [facility] and will continue to follow patient to assist with dc planning.  [place], [place] [phone]

## 2011-10-12 NOTE — Progress Notes (Signed)
Physical Therapy Treatment Patient Details Name: [name] MRN: [id number] DOB: Jun 17, 1950 Today's Date: 10/12/2011 Time: 1003-1030 PT Time Calculation (min): 27 min  PT Assessment / Plan / Recommendation        Follow Up Recommendations  Post acute inpatient     Does the patient have the potential to tolerate intense rehabilitation  No, Recommend SNF  Barriers to Discharge        Equipment Recommendations  None recommended by OT    Recommendations for Other Services    Frequency Min 3X/week   Plan Discharge plan remains appropriate    Precautions / Restrictions Precautions Precautions: Fall Restrictions Weight Bearing Restrictions: No       Mobility  Bed Mobility Bed Mobility: Supine to Sit;Sitting - Scoot to Edge of Bed Supine to Sit: 4: Min assist Sitting - Scoot to [place] of Bed: 6: Modified independent (Device/Increase time) Details for Bed Mobility Assistance: Minor assist needed today to initiate LE movement to EOB Transfers Transfers: Sit to Stand;Stand to Sit Sit to Stand: 4: Min assist;With upper extremity assist;From bed Stand to Sit: 4: Min guard;With upper extremity assist;With armrests;To chair/3-in-1 Details for Transfer Assistance: cues for hand placement.  Minor assist to achieve standing.   Ambulation/Gait Ambulation/Gait Assistance: 4: Min assist Ambulation Distance (Feet): 150 Feet Assistive device: Rolling walker Ambulation/Gait Assistance Details: Assist for RW management.  Pt with poor safety awareness of obstacles & need to navigate around obstacles.  No knee buckling noted today.  Very easily distracted & veering off straight course.   Gait Pattern: Step-through pattern;Decreased stride length (decreased floor clearance)      PT Goals Acute Rehab PT Goals Time For Goal Achievement: 10/16/11 Potential to Achieve Goals: Good Pt will go Sit to Stand: with supervision PT Goal: Sit to Stand - Progress: Progressing toward goal Pt will  go Stand to Sit: with supervision PT Goal: Stand to Sit - Progress: Progressing toward goal Pt will Transfer Bed to Chair/Chair to Bed: with supervision Pt will Ambulate: >150 feet;with supervision;with rolling walker PT Goal: Ambulate - Progress: Not met  Visit Information  Last PT Received On: 10/12/11 Assistance Needed: +1    Subjective Data      Cognition  Overall Cognitive Status: Impaired Area of Impairment: Attention;Memory;Following commands;Safety/judgement;Awareness of deficits;Problem solving Arousal/Alertness: Awake/alert Orientation Level: Other (comment) (pt with minimal verbalization.  difficult to assess) Behavior During Session: Flat affect Current Attention Level: Selective Memory Deficits: cues to recall information re: daily events Following Commands: Follows one step commands consistently Safety/Judgement: Decreased safety judgement for tasks assessed;Impulsive;Decreased awareness of need for assistance Safety/Judgement - Other Comments: Requires max cueing for safe RW management     Balance     End of Session PT - End of Session Equipment Utilized During Treatment: Gait belt Activity Tolerance: Patient tolerated treatment well;Patient limited by fatigue Patient left: in chair;with call bell/phone within reach;with family/visitor present Nurse Communication: Mobility status    Verdell Face, [place] [phone] 10/12/2011

## 2011-10-12 NOTE — Progress Notes (Signed)
As above.

## 2011-10-12 NOTE — Progress Notes (Signed)
[facility] PROGRESS NOTE  [name] JWJ:[id number] DOB: 10-30-1950 DOA: 10/08/2011 PCP: No primary provider on file.  Assessment/Plan: 1. H/o subarachnoid hemorrhage with hydrocephalus: Dr. Phoebe Perch (neurosurgery) on board and will follow up with his recommendations.  CT of head on 10/08/11 reported no acute findings.  Did report however evolving encephalomalacia in the ACA territory.  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 Corliss Skains on 08/19/11.  Per Neurosurgery likely hood of VP shunt infection low 2. Seizure d/o: Recommendations per neurology.  I have reviewed their recommendations and we will continue the Keppra as mentioned in their note.  Should patient have any more seizure like activity will plan on contacting neurology for further recommendations. 3. UTI:  U/A on 10/05 showed hazy urine with large hgb and many bacteria.  Urine culture with no growth.  Will plan on continuing levaquin at this juncture.  Vancomycin and rocephin discontinued on 10/08.  Will recheck cbc next am.  Patient is afebrile within the last 24 hours. 4. DM: Blood sugars relatively well controlled at this point.  Will continue SSI and will advance diet to diabetic diet  5. Hypertension:  Home blood pressure medication was held initially most likely due to lower blood pressure readings on admission.  At this point patient has required hydralazine and labetalol prn.  Will go ahead and add patient's home blood pressure medication regimen.  Will continue the hydralazine prn elevated blood pressures.  Discussed with nursing.  Pt's BP improved on home regimen and last value reported 164/79 6. Decreased oral intake:  At this point will place on 1/2 MIVF.  Will administer megace to help with appetite and advance diet as tolerated.  Will consult dietician for further recommendations given poor oral intake.  Code Status: Full Disposition Plan: Pending improvement in clinical condition and cessation of fevers.      Consultants:  Neurosurgery: Dr. Phoebe Perch  Neurology: Dr. Otelia Limes  Procedures:  None  Antibiotics:  Levaquin started 10/5  Vancomycin started 10/5 d/c'd 10/08  ceftriaxone started 10/5 d/c'd 10/08  HPI/Subjective: Pt reportedly does not want to eat.  She states that she feels full.  Also husband reports hiccups at night.  No acute issues overnight.     Objective: Filed Vitals:   10/12/11 0429 10/12/11 0430 10/12/11 0545 10/12/11 0800  BP:  184/84 133/85 164/79  Pulse: 72 74 74 74  Temp: 98.5 F (36.9 C)   98.6 F (37 C)  TempSrc: Oral   Oral  Resp:  23 22 18   Height:      Weight: 99.9 kg (220 lb 3.8 oz)     SpO2: 98% 98% 99% 96%    Intake/Output Summary (Last 24 hours) at 10/12/11 0928 Last data filed at 10/12/11 0900  Gross per 24 hour  Intake   1880 ml  Output   1125 ml  Net    755 ml   Filed Weights   10/10/11 0444 10/11/11 0500 10/12/11 0429  Weight: 98 kg (216 lb 0.8 oz) 102.9 kg (226 lb 13.7 oz) 99.9 kg (220 lb 3.8 oz)    Exam:   General:  Pt in NAD, resting supine in bed  Cardiovascular: RRR, No MRG  Respiratory: CTA BL, no increased work of breathing  Abdomen: soft, nt, nd  Data Reviewed: Basic Metabolic Panel:  Lab 10/10/11 6213 10/08/11 1856  NA 144 137  K 3.3* 3.3*  CL 109 101  CO2 23 23  GLUCOSE 111* 188*  BUN 24*  27*  CREATININE 1.08 1.27*  CALCIUM 9.1 9.6  MG -- --  PHOS -- --   Liver Function Tests:  Lab 10/08/11 1856  AST 42*  ALT 29  ALKPHOS 129*  BILITOT 1.1  PROT 7.5  ALBUMIN 2.7*   No results found for this basename: LIPASE:5,AMYLASE:5 in the last 168 hours No results found for this basename: AMMONIA:5 in the last 168 hours CBC:  Lab 10/12/11 0600 10/10/11 0656 10/08/11 1856  WBC 11.5* 9.9 7.7  NEUTROABS -- -- 7.0  HGB 9.0* 9.7* 11.4*  HCT 28.4* 30.8* 35.8*  MCV 81.6 82.1 83.1  PLT 85* 85* 92*   Cardiac Enzymes: No results found for this basename: CKTOTAL:5,CKMB:5,CKMBINDEX:5,TROPONINI:5 in the  last 168 hours BNP (last 3 results) No results found for this basename: PROBNP:3 in the last 8760 hours CBG:  Lab 10/12/11 0806 10/12/11 0423 10/12/11 0008 10/11/11 2017 10/11/11 1612  GLUCAP 119* 111* 114* 108* 142*    Recent Results (from the past 240 hour(s))  CULTURE, BLOOD (ROUTINE X 2)     Status: Normal (Preliminary result)   Collection Time   10/08/11  6:56 PM      Component Value Range Status Comment   Specimen Description BLOOD LEFT ANTECUBITAL   Final    Special Requests BOTTLES DRAWN AEROBIC AND ANAEROBIC 6CC   Final    Culture NO GROWTH 3 DAYS   Final    Report Status PENDING   Incomplete   CULTURE, BLOOD (ROUTINE X 2)     Status: Normal (Preliminary result)   Collection Time   10/08/11  7:15 PM      Component Value Range Status Comment   Specimen Description BLOOD LEFT HAND   Final    Special Requests BOTTLES DRAWN AEROBIC AND ANAEROBIC 6CC   Final    Culture NO GROWTH 3 DAYS   Final    Report Status PENDING   Incomplete   URINE CULTURE     Status: Normal   Collection Time   10/08/11  8:04 PM      Component Value Range Status Comment   Specimen Description URINE, CATHETERIZED   Final    Special Requests NONE   Final    Culture  Setup Time 10/09/2011 21:43   Final    Colony Count NO GROWTH   Final    Culture NO GROWTH   Final    Report Status 10/11/2011 FINAL   Final   MRSA PCR SCREENING     Status: Normal   Collection Time   10/09/11  1:55 AM      Component Value Range Status Comment   MRSA by PCR NEGATIVE  NEGATIVE Final      Studies: No results found.  Scheduled Meds:    . antiseptic oral rinse  15 mL Mouth Rinse q12n4p  . chlorhexidine  15 mL Mouth/Throat BID  . feeding supplement  1 Container Oral TID BM  . influenza  inactive virus vaccine  0.5 mL Intramuscular Tomorrow-1000  . insulin aspart  0-9 Units Subcutaneous Q4H  . labetalol  200 mg Oral TID  . levetiracetam  500 mg Intravenous BID  . levofloxacin  500 mg Oral Daily  . lisinopril  10 mg  Oral Daily  . megestrol  40 mg Oral Daily  . multivitamin with minerals  1 tablet Oral Daily  . pantoprazole (PROTONIX) IV  40 mg Intravenous Q24H  . pneumococcal 23 valent vaccine  0.5 mL Intramuscular Tomorrow-1000  . potassium chloride  40  mEq Oral Once  . sodium chloride  1,000 mL Intravenous Once  . sodium chloride  500 mL Intravenous Once  . sodium chloride  3 mL Intravenous Q12H   Continuous Infusions:    . dextrose 5 % and 0.45 % NaCl with KCl 20 mEq/L 50 mL/hr at 10/12/11 0544    Principal Problem:  *Fever Active Problems:  HTN (hypertension)  SAH (subarachnoid hemorrhage) 08/16/11  Seizure disorder, seizure 09/21/11 while in rehab  VP (ventriculoperitoneal) shunt status  Diabetes mellitus, type 2  UTI (lower urinary tract infection)  Decreased oral intake    Time spent: > 35 minutes    Penny Pia  [facility] Pager [phone] If 8PM-8AM, please contact night-coverage at [email or web address], password [facility] 10/12/2011, 9:28 AM  LOS: 4 days

## 2011-10-12 NOTE — Progress Notes (Signed)
Nutrition Follow-up/Consult  Intervention:   1. Discontinue Ensure Pudding 2. Add Resource Breeze po TID, each supplement provides 250 kcal and 9 grams of protein. 3. Made goal for pt to consume at least 50% of all meals and drink 1-2 Resource Breezes daily; encouraged family to bring in foods for pt to help promote better intake 4. If intake does not improve consider short-term nutrition support to support ongoing poor PO intake 5. RD to continue to follow nutrition care plan  Assessment:   RD consulted for nutrition assessment, please see full RD Initial Assessment on 10/7.  Pt continues on Carbohydrate Modified Medium diet. Pt and family confirm that intake remains poor. Pt states that her intake is poor 2/2 food preferences, however when asked what she would like to eat, she doesn't respond. Pt also reiterated that she doesn't like Ensure Pudding.  Skin: No skin breakdown noted. Notable labs: CBG's 111 - 136; potassium low @ 3.3 Pertinent meds: megace added 10/8; novolog; MVI; protonix Last BM: 10/9  Diet Order:  Carbohydrate Modified Medium (1600 - 2000) Supplements: Ensure Pudding PO TID  Meds: Scheduled Meds:   . antiseptic oral rinse  15 mL Mouth Rinse q12n4p  . chlorhexidine  15 mL Mouth/Throat BID  . feeding supplement  1 Container Oral TID BM  . influenza  inactive virus vaccine  0.5 mL Intramuscular Tomorrow-1000  . insulin aspart  0-9 Units Subcutaneous Q4H  . labetalol  200 mg Oral TID  . levetiracetam  500 mg Intravenous BID  . levofloxacin  500 mg Oral Daily  . lisinopril  10 mg Oral Daily  . megestrol  40 mg Oral Daily  . multivitamin with minerals  1 tablet Oral Daily  . pantoprazole (PROTONIX) IV  40 mg Intravenous Q24H  . pneumococcal 23 valent vaccine  0.5 mL Intramuscular Tomorrow-1000  . sodium chloride  1,000 mL Intravenous Once  . sodium chloride  500 mL Intravenous Once  . sodium chloride  3 mL Intravenous Q12H   Continuous Infusions:   . dextrose  5 % and 0.45 % NaCl with KCl 20 mEq/L 50 mL/hr at 10/12/11 0544   PRN Meds:.acetaminophen, bisacodyl, cloNIDine, hydrALAZINE, HYDROmorphone (DILAUDID) injection, ondansetron (ZOFRAN) IV  Labs:  CMP     Component Value Date/Time   NA 144 10/10/2011 0656   K 3.3* 10/10/2011 0656   CL 109 10/10/2011 0656   CO2 23 10/10/2011 0656   GLUCOSE 111* 10/10/2011 0656   BUN 24* 10/10/2011 0656   CREATININE 1.08 10/10/2011 0656   CALCIUM 9.1 10/10/2011 0656   PROT 7.5 10/08/2011 1856   ALBUMIN 2.7* 10/08/2011 1856   AST 42* 10/08/2011 1856   ALT 29 10/08/2011 1856   ALKPHOS 129* 10/08/2011 1856   BILITOT 1.1 10/08/2011 1856   GFRNONAA 53* 10/10/2011 0656   GFRAA 62* 10/10/2011 0656   CBG (last 3)   Basename 10/12/11 1149 10/12/11 0806 10/12/11 0423  GLUCAP 136* 119* 111*    No recent magnesium or phosphorus.   Intake/Output Summary (Last 24 hours) at 10/12/11 1228 Last data filed at 10/12/11 1200  Gross per 24 hour  Intake   1960 ml  Output   1325 ml  Net    635 ml   Weight Status:  99.9 kg/220 lb; wt up 6 lb x 2 days  Body mass index is 36.65 kg/(m^2). Obese class II  Estimated needs:  1600 - 1800 kcal, 115 - 130 grams protein  Nutrition Dx:  Inadequate oral intake r/t AMS  AEB chart review and RN report. Ongoing.  Goal:  Pt to meet >/= 90% of their estimated nutrition needs - unmet  Monitor:  weight trends, lab trends, I/O's, PO intake, supplement tolerance  Jarold Motto MS, RD, LDN Pager: [phone] After-hours pager: [phone]

## 2011-10-13 ENCOUNTER — Inpatient Hospital Stay (HOSPITAL_COMMUNITY): Payer: Medicaid Other

## 2011-10-13 DIAGNOSIS — D72829 Elevated white blood cell count, unspecified: Secondary | ICD-10-CM

## 2011-10-13 LAB — COMPREHENSIVE METABOLIC PANEL
ALT: 19 U/L (ref 0–35)
AST: 31 U/L (ref 0–37)
Albumin: 2.4 g/dL — ABNORMAL LOW (ref 3.5–5.2)
Calcium: 8.9 mg/dL (ref 8.4–10.5)
Sodium: 136 mEq/L (ref 135–145)
Total Protein: 6.1 g/dL (ref 6.0–8.3)

## 2011-10-13 LAB — CBC
HCT: 28.6 % — ABNORMAL LOW (ref 36.0–46.0)
HCT: 29.4 % — ABNORMAL LOW (ref 36.0–46.0)
Hemoglobin: 9.1 g/dL — ABNORMAL LOW (ref 12.0–15.0)
Hemoglobin: 9.4 g/dL — ABNORMAL LOW (ref 12.0–15.0)
MCHC: 31.8 g/dL (ref 30.0–36.0)
RDW: 17.4 % — ABNORMAL HIGH (ref 11.5–15.5)
WBC: 12.4 10*3/uL — ABNORMAL HIGH (ref 4.0–10.5)
WBC: 12.9 10*3/uL — ABNORMAL HIGH (ref 4.0–10.5)

## 2011-10-13 LAB — GLUCOSE, CAPILLARY
Glucose-Capillary: 106 mg/dL — ABNORMAL HIGH (ref 70–99)
Glucose-Capillary: 114 mg/dL — ABNORMAL HIGH (ref 70–99)
Glucose-Capillary: 125 mg/dL — ABNORMAL HIGH (ref 70–99)
Glucose-Capillary: 131 mg/dL — ABNORMAL HIGH (ref 70–99)

## 2011-10-13 LAB — URINALYSIS, ROUTINE W REFLEX MICROSCOPIC
Bilirubin Urine: NEGATIVE
Glucose, UA: NEGATIVE mg/dL
Hgb urine dipstick: NEGATIVE
Protein, ur: 100 mg/dL — AB
Specific Gravity, Urine: 1.013 (ref 1.005–1.030)
Urobilinogen, UA: 0.2 mg/dL (ref 0.0–1.0)

## 2011-10-13 LAB — URINE MICROSCOPIC-ADD ON

## 2011-10-13 LAB — APTT: aPTT: 35 seconds (ref 24–37)

## 2011-10-13 LAB — SEDIMENTATION RATE: Sed Rate: 90 mm/hr — ABNORMAL HIGH (ref 0–22)

## 2011-10-13 LAB — D-DIMER, QUANTITATIVE: D-Dimer, Quant: 14.71 ug/mL-FEU — ABNORMAL HIGH (ref 0.00–0.48)

## 2011-10-13 MED ORDER — IOHEXOL 300 MG/ML  SOLN
20.0000 mL | INTRAMUSCULAR | Status: AC
  Administered 2011-10-13 (×2): 20 mL via ORAL

## 2011-10-13 MED ORDER — DEXTROSE 5 % IV SOLN
1.0000 g | INTRAVENOUS | Status: DC
  Administered 2011-10-13 – 2011-10-15 (×3): 1 g via INTRAVENOUS
  Filled 2011-10-13 (×3): qty 10

## 2011-10-13 MED ORDER — METRONIDAZOLE IN NACL 5-0.79 MG/ML-% IV SOLN
500.0000 mg | Freq: Three times a day (TID) | INTRAVENOUS | Status: DC
  Administered 2011-10-13 – 2011-10-15 (×6): 500 mg via INTRAVENOUS
  Filled 2011-10-13 (×7): qty 100

## 2011-10-13 MED ORDER — IOHEXOL 300 MG/ML  SOLN
80.0000 mL | Freq: Once | INTRAMUSCULAR | Status: AC | PRN
  Administered 2011-10-13: 80 mL via INTRAVENOUS

## 2011-10-13 NOTE — Progress Notes (Signed)
Physical Therapy Treatment Patient Details Name: [name] MRN: [id number] DOB: May 10, 1950 Today's Date: 10/13/2011 Time: 2951-8841 PT Time Calculation (min): 24 min  PT Assessment / Plan / Recommendation Comments on Treatment Session  Pt seemed to require increased assist for ambulation today (physically & verbally).  She was consistently running into objects & has difficult time problem solving safe technique to avoid obstacles.      Follow Up Recommendations  Post acute inpatient     Does the patient have the potential to tolerate intense rehabilitation  No, Recommend SNF  Barriers to Discharge        Equipment Recommendations  None recommended by OT    Recommendations for Other Services    Frequency Min 3X/week   Plan Discharge plan remains appropriate    Precautions / Restrictions Precautions Precautions: Fall Restrictions Weight Bearing Restrictions: No       Mobility  Bed Mobility Bed Mobility: Not assessed Transfers Transfers: Sit to Stand;Stand to Sit Sit to Stand: 4: Min assist;With upper extremity assist;With armrests;From chair/3-in-1 Stand to Sit: 4: Min guard;With upper extremity assist;With armrests;To chair/3-in-1 Details for Transfer Assistance: assist to achieve standing from recliner.  Had to use rocking motion to get momentum to stand.  Cues for safe hand placement.   Ambulation/Gait Ambulation/Gait Assistance: 4: Min assist Ambulation Distance (Feet): 200 Feet Assistive device: Rolling walker Ambulation/Gait Assistance Details: Consistent min assist with ocassional mod assist (pt 70%) for balance & safe RW management today.  Cont's to have poor safety awareness of environment especially to Rt side.   Gait Pattern: Step-through pattern;Decreased stride length Stairs: No Wheelchair Mobility Wheelchair Mobility: No      PT Goals Acute Rehab PT Goals Time For Goal Achievement: 10/16/11 Potential to Achieve Goals: Good Pt will go Sit to  Stand: with supervision PT Goal: Sit to Stand - Progress: Not met Pt will go Stand to Sit: with supervision PT Goal: Stand to Sit - Progress: Not met Pt will Transfer Bed to Chair/Chair to Bed: with supervision Pt will Ambulate: >150 feet;with supervision;with rolling walker PT Goal: Ambulate - Progress: Not met  Visit Information  Last PT Received On: 10/13/11 Assistance Needed: +1    Subjective Data  Subjective: "I guess"   Cognition  Overall Cognitive Status: Impaired Area of Impairment: Safety/judgement;Awareness of errors;Awareness of deficits;Problem solving;Attention;Following commands Arousal/Alertness: Awake/alert Behavior During Session: Flat affect Current Attention Level: Selective Following Commands: Follows multi-step commands inconsistently Safety/Judgement: Decreased awareness of safety precautions;Decreased safety judgement for tasks assessed Safety/Judgement - Other Comments: Requires max cueing for safe RW management & avoidance of obstacles in path.   Awareness of Errors: Assistance required to identify errors made;Assistance required to correct errors made Problem Solving: Pt required max cueing for navigation around obstacles while ambulating in room & hallway.   Cognition - Other Comments: Pt required max cueing during ambulation for increased safety awareness & safe RW management during ambulation.  Pt constantly running into objects R>L & could not problem solve how to manuever around OT when she stood in front of pt.      Balance     End of Session PT - End of Session Equipment Utilized During Treatment: Gait belt Activity Tolerance: Patient tolerated treatment well;Patient limited by fatigue Patient left: in chair;with call bell/phone within reach Nurse Communication: Mobility status    Verdell Face, [place] [phone] 10/13/2011

## 2011-10-13 NOTE — Progress Notes (Signed)
MD Cena Benton order for VQ scan. IV team placed 20 to [facility] after order placed. Received telephone order from [facility] for PT/PTT, CBC, D-Dimer, CMET, INR. MD aware that pt will need PICC per IV team. Katrinka Blazing, [place] L

## 2011-10-13 NOTE — Progress Notes (Signed)
Was called by radiologist due to recent CT of abdomen and pelvis findings.  I immediately consulted general surgery for further recommendations and Urology.  Urology reports no acute intervention at this point.  Radiologist also reported suspicious finding for possible PE and recommended further imaging and work up.  Of note the patient is saturating at 100 percent and had no complaints of cough or chest pain, hemoptysis.    Discussed with nursing and currently we are trying to place IV so that patient may get CT angiogram with contrast to further evaluate for PE.  Should access become an issue then would consider obtaining a VQ scan.    Tayon Parekh, [employer]

## 2011-10-13 NOTE — Progress Notes (Signed)
Occupational Therapy Treatment Patient Details Name: [name] MRN: [id number] DOB: [birth year] Today's Date: 10/13/2011 Time: 4098-1191 OT Time Calculation (min): 19 min  OT Assessment / Plan / Recommendation Comments on Treatment Session Pt. with Rt. spatial neglect/inattention and requires max verbal cues for problem solving through novel tasks    Follow Up Recommendations  Skilled nursing facility    Barriers to Discharge       Equipment Recommendations  None recommended by OT    Recommendations for Other Services    Frequency Min 2X/week   Plan Discharge plan remains appropriate    Precautions / Restrictions Precautions Precautions: Fall Restrictions Weight Bearing Restrictions: No   Pertinent Vitals/Pain     ADL  Grooming:  (Pt. refused) Transfers/Ambulation Related to ADLs: Pt ambulates with min A.  Pt. tends to veer to Rt. and consistently runs into objects on Rt. side.  ADL Comments: Pt. noted to run into items consistently on Rt. side.  When unmoveable object on Rt. side, pt. required physical assist to avoid running into it, and max v and tactile cues to figure out how to move around it to avoid it.  Worked on visual scanning and locating items on Rt side - required min verbal cues and increased time    OT Diagnosis:    OT Problem List:   OT Treatment Interventions:     OT Goals Acute Rehab OT Goals OT Goal Formulation: With patient Time For Goal Achievement: 10/25/11 Potential to Achieve Goals: Good Miscellaneous OT Goals Miscellaneous OT Goal #1: Pt. will locate items on Rt. side with min cues consistently OT Goal: Miscellaneous Goal #1 - Progress: Goal set today  Visit Information  Last OT Received On: 10/13/11 Assistance Needed: +1 PT/OT Co-Evaluation/Treatment: Yes    Subjective Data      Prior Functioning       Cognition  Overall Cognitive Status: Impaired Area of Impairment: Safety/judgement;Awareness of errors;Awareness of  deficits;Problem solving;Attention;Following commands Arousal/Alertness: Awake/alert Behavior During Session: Flat affect Current Attention Level: Selective Attention - Other Comments: selective with min cues Following Commands: Follows multi-step commands inconsistently Safety/Judgement: Decreased awareness of safety precautions;Decreased safety judgement for tasks assessed Safety/Judgement - Other Comments: Requires max cueing for safe RW management & avoidance of obstacles in path.   Awareness of Errors: Assistance required to identify errors made;Assistance required to correct errors made Problem Solving: Pt required max cueing for navigation around obstacles while ambulating in room & hallway.   Cognition - Other Comments: Pt required max cueing during ambulation for increased safety awareness & safe RW management during ambulation.  Pt constantly running into objects R>L & could not problem solve how to manuever around OT when she stood in front of pt.      Mobility  Shoulder Instructions Bed Mobility Bed Mobility: Not assessed Transfers Transfers: Sit to Stand;Stand to Sit Sit to Stand: 4: Min assist;With upper extremity assist;With armrests;From chair/3-in-1 Stand to Sit: 4: Min guard;With upper extremity assist;With armrests;To chair/3-in-1 Details for Transfer Assistance: assist to achieve standing from recliner.  Had to use rocking motion to get momentum to stand.  Cues for safe hand placement.         Exercises      Balance     End of Session OT - End of Session Activity Tolerance: Patient tolerated treatment well Patient left: in chair;with call bell/phone within reach  GO     [name] M 10/13/2011, 3:19 PM

## 2011-10-13 NOTE — Progress Notes (Signed)
Clinical Social Work  CSW submitted [employer] authorization via [employer]. Confirmation # [id number] W.  CSW attempted to meet with patient but RN in room. CSW will follow up at a later time.  [place], [place] [phone]

## 2011-10-13 NOTE — Progress Notes (Signed)
Clinical Social Work  CSW spoke with husband and updated family on [facility] offering a bed. Family to talk about options before making a final decision. CSW will continue to follow.  [place], [place] [phone]

## 2011-10-13 NOTE — Progress Notes (Signed)
Pt with limited IV access, only a 24g placed by IV team in hand. Pt needs 20 gauge to have CT angio of chest to r/o PE. Also need labs BUN/CR before giving more contrast. MD Vega aware. VS stable at this time, waiting on IV team to place 20 gauge. Will cont to monitor pt. Reeva Davern L

## 2011-10-13 NOTE — Progress Notes (Signed)
[facility] PROGRESS NOTE  [name]:[id number] DOB: 21-Jan-1950 DOA: 10/08/2011 PCP: No primary provider on file.  Assessment/Plan: 1. H/o subarachnoid hemorrhage with hydrocephalus: Dr. Phoebe Perch (neurosurgery) on board and will follow up with his recommendations.  CT of head on 10/08/11 reported no acute findings.  Did report however evolving encephalomalacia in the ACA territory.  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 Corliss Skains on 08/19/11.  Per Neurosurgery likely hood of VP shunt infection low. I agree with this at this juncture. 2. Seizure d/o: Recommendations per neurology.  I have reviewed their recommendations and we will continue the Keppra as mentioned in their note.  Should patient have any more seizure like activity will plan on contacting neurology for further recommendations. 3. UTI:  U/A on 10/05 showed hazy urine with large hgb and many bacteria.  Urine culture with no growth.  WBC elevated despite being on Levaquin will add Rocephin at this juncture and repeat urinalysis.  Vancomycin discontinued on 10/08.  Will recheck cbc next am.  Patient is afebrile within the last 24 hours. 4. DM: Blood sugars relatively well controlled at this point.  Will continue SSI and will advance diet to diabetic diet  5. Hypertension:  Home blood pressure medication was held initially most likely due to lower blood pressure readings on admission.  At this point patient has required hydralazine and labetalol prn.  Will go ahead and add patient's home blood pressure medication regimen.  Will continue the hydralazine prn elevated blood pressures.  Discussed with nursing.  Pt's BP improved on home regimen and last value reported 164/79 6. Decreased oral intake:  At this point will place on 1/2 MIVF.  Po intake improved.  Will f/u with dieticians recommendations. 7. Leukocytosis: Source? At this point suspected UTI but U/A showed no nitrites or leukocytes.  Patient's WBC has trended up  despite being on IV antibiotics.  CT scan of the chest did not show any sources of infection.  Neurosurg does not suspect neurological source based on their work up.  Will order CT scan of abdomen and pelvis looking for source and repeat urinalysis.  Also will expand antibiotic regimen to include Rocephin.  Will also order CRP, ESR, protein electrophoresis, ANA, rheumatoid factor, ldh  Code Status: Full Disposition Plan: Once Leukocytosis resolves patient may be transitioned to SNF.  At this juncture will look for source and obtain further work up (Ct abdomen/pelvis and U/A)    Consultants:  Neurosurgery: Dr. Phoebe Perch  Neurology: Dr. Otelia Limes  Procedures:  None  Antibiotics:  Levaquin started 10/5  Vancomycin started 10/5 d/c'd 10/08  ceftriaxone started 10/5 d/c'd 10/08 Restarted on 10/10  HPI/Subjective: Po intake much improved today and patient ate all of her breakfast.  No new complaints.  Patient denies any fever, chills, headaches, abdominal discomfort, dysuria, cough or sob   Objective: Filed Vitals:   10/13/11 0600 10/13/11 0700 10/13/11 0800 10/13/11 0900  BP: 184/75 146/70 164/75 144/73  Pulse:  73 68 81  Temp:   98.8 F (37.1 C)   TempSrc:   Oral   Resp:  26 24 24   Height:      Weight:      SpO2:  97% 98% 98%    Intake/Output Summary (Last 24 hours) at 10/13/11 0931 Last data filed at 10/13/11 0800  Gross per 24 hour  Intake   1630 ml  Output   2251 ml  Net   -621 ml   [employer]  10/11/11 0500 10/12/11 0429 10/13/11 0417  Weight: 102.9 kg (226 lb 13.7 oz) 99.9 kg (220 lb 3.8 oz) 101.1 kg (222 lb 14.2 oz)    Exam:   General:  Pt in NAD, resting supine in bed  Cardiovascular: RRR, No MRG  Respiratory: CTA BL, no increased work of breathing  Abdomen: soft, nt, nd  Data Reviewed: Basic Metabolic Panel:  Lab 10/10/11 9562 10/08/11 1856  NA 144 137  K 3.3* 3.3*  CL 109 101  CO2 23 23  GLUCOSE 111* 188*  BUN 24* 27*  CREATININE 1.08  1.27*  CALCIUM 9.1 9.6  MG -- --  PHOS -- --   Liver Function Tests:  Lab 10/08/11 1856  AST 42*  ALT 29  ALKPHOS 129*  BILITOT 1.1  PROT 7.5  ALBUMIN 2.7*   No results found for this basename: LIPASE:5,AMYLASE:5 in the last 168 hours No results found for this basename: AMMONIA:5 in the last 168 hours CBC:  Lab 10/13/11 0706 10/12/11 0600 10/10/11 0656 10/08/11 1856  WBC 12.4* 11.5* 9.9 7.7  NEUTROABS -- -- -- 7.0  HGB 9.1* 9.0* 9.7* 11.4*  HCT 28.6* 28.4* 30.8* 35.8*  MCV 81.3 81.6 82.1 83.1  PLT 106* 85* 85* 92*   Cardiac Enzymes: No results found for this basename: CKTOTAL:5,CKMB:5,CKMBINDEX:5,TROPONINI:5 in the last 168 hours BNP (last 3 results) No results found for this basename: PROBNP:3 in the last 8760 hours CBG:  Lab 10/13/11 0835 10/13/11 0403 10/13/11 0034 10/12/11 2010 10/12/11 1601  GLUCAP 114* 106* 97 143* 105*    Recent Results (from the past 240 hour(s))  CULTURE, BLOOD (ROUTINE X 2)     Status: Normal (Preliminary result)   Collection Time   10/08/11  6:56 PM      Component Value Range Status Comment   Specimen Description BLOOD LEFT ANTECUBITAL   Final    Special Requests BOTTLES DRAWN AEROBIC AND ANAEROBIC 6CC   Final    Culture NO GROWTH 4 DAYS   Final    Report Status PENDING   Incomplete   CULTURE, BLOOD (ROUTINE X 2)     Status: Normal (Preliminary result)   Collection Time   10/08/11  7:15 PM      Component Value Range Status Comment   Specimen Description BLOOD LEFT HAND   Final    Special Requests BOTTLES DRAWN AEROBIC AND ANAEROBIC 6CC   Final    Culture NO GROWTH 4 DAYS   Final    Report Status PENDING   Incomplete   URINE CULTURE     Status: Normal   Collection Time   10/08/11  8:04 PM      Component Value Range Status Comment   Specimen Description URINE, CATHETERIZED   Final    Special Requests NONE   Final    Culture  Setup Time 10/09/2011 21:43   Final    Colony Count NO GROWTH   Final    Culture NO GROWTH   Final    Report  Status 10/11/2011 FINAL   Final   MRSA PCR SCREENING     Status: Normal   Collection Time   10/09/11  1:55 AM      Component Value Range Status Comment   MRSA by PCR NEGATIVE  NEGATIVE Final      Studies: No results found.  Scheduled Meds:    . antiseptic oral rinse  15 mL Mouth Rinse q12n4p  . chlorhexidine  15 mL Mouth/Throat BID  . feeding supplement  1 Container Oral TID BM  . influenza  inactive virus vaccine  0.5 mL Intramuscular Tomorrow-1000  . insulin aspart  0-9 Units Subcutaneous Q4H  . labetalol  200 mg Oral TID  . levETIRAcetam  500 mg Oral BID  . levofloxacin  500 mg Oral Daily  . lisinopril  10 mg Oral Daily  . megestrol  40 mg Oral Daily  . multivitamin with minerals  1 tablet Oral Daily  . pantoprazole  40 mg Oral QHS  . pneumococcal 23 valent vaccine  0.5 mL Intramuscular Tomorrow-1000  . sodium chloride  1,000 mL Intravenous Once  . sodium chloride  500 mL Intravenous Once  . sodium chloride  3 mL Intravenous Q12H  . DISCONTD: feeding supplement  1 Container Oral TID BM  . DISCONTD: levetiracetam  500 mg Intravenous BID  . DISCONTD: pantoprazole (PROTONIX) IV  40 mg Intravenous Q24H   Continuous Infusions:    . dextrose 5 % and 0.45 % NaCl with KCl 20 mEq/L 50 mL/hr at 10/13/11 0038    Principal Problem:  *Fever Active Problems:  HTN (hypertension)  SAH (subarachnoid hemorrhage) 08/16/11  Seizure disorder, seizure 09/21/11 while in rehab  VP (ventriculoperitoneal) shunt status  Diabetes mellitus, type 2  UTI (lower urinary tract infection)  Decreased oral intake    Time spent: > 35 minutes    Penny Pia  [facility] Pager [phone] If 8PM-8AM, please contact night-coverage at [email or web address], password [facility] 10/13/2011, 9:31 AM  LOS: 5 days

## 2011-10-13 NOTE — Consult Note (Signed)
Reason for Consult:Leukocystosis, ?appendicitis Referring Physician: Dr Redmond Baseman is an 61 y.o. female.  HPI: She is here for [facility] and is s/p VP shunt.  Her WBC has been rising for the past few days, despite being on antibiotics (Cetriaxone currently).  She underwent a CT abd/pelvis today which showed some fluid in the pelvis and we were asked to evaluate for appendicitis.  She denies any recent fevers, abdominal pain, nausea, vomiting or diarrhea.  Past Medical History  Diagnosis Date  . Hypertension     Past Surgical History  Procedure Date  . Abdominal hysterectomy   . Ventriculoperitoneal shunt 09/02/2011    Procedure: SHUNT INSERTION VENTRICULAR-PERITONEAL;  Surgeon: Maeola Harman, MD;  Location: [facility];  Service: Neurosurgery;  Laterality: Right;    No family history on file.  Social History:  reports that she has never smoked. She does not have any smokeless tobacco history on file. She reports that she does not drink alcohol or use illicit drugs.  Allergies:  Allergies  Allergen Reactions  . Lactose Intolerance (Gi) Other (See Comments)    G.I. Upset    Medications: I have reviewed the patient's current medications.  Results for orders placed during the hospital encounter of 10/08/11 (from the past 48 hour(s))  GLUCOSE, CAPILLARY     Status: Abnormal   Collection Time   10/11/11  8:17 PM      Component Value Range Comment   Glucose-Capillary 108 (*) 70 - 99 mg/dL   GLUCOSE, CAPILLARY     Status: Abnormal   Collection Time   10/12/11 12:08 AM      Component Value Range Comment   Glucose-Capillary 114 (*) 70 - 99 mg/dL   GLUCOSE, CAPILLARY     Status: Abnormal   Collection Time   10/12/11  4:23 AM      Component Value Range Comment   Glucose-Capillary 111 (*) 70 - 99 mg/dL   CBC     Status: Abnormal   Collection Time   10/12/11  6:00 AM      Component Value Range Comment   WBC 11.5 (*) 4.0 - 10.5 K/uL    RBC 3.48 (*) 3.87 - 5.11 MIL/uL    Hemoglobin 9.0 (*) 12.0 - 15.0 g/dL    HCT 16.1 (*) 09.6 - 46.0 %    MCV 81.6  78.0 - 100.0 fL    MCH 25.9 (*) 26.0 - 34.0 pg    MCHC 31.7  30.0 - 36.0 g/dL    RDW 04.5 (*) 40.9 - 15.5 %    Platelets 85 (*) 150 - 400 K/uL CONSISTENT WITH PREVIOUS RESULT  GLUCOSE, CAPILLARY     Status: Abnormal   Collection Time   10/12/11  8:06 AM      Component Value Range Comment   Glucose-Capillary 119 (*) 70 - 99 mg/dL   GLUCOSE, CAPILLARY     Status: Abnormal   Collection Time   10/12/11 11:49 AM      Component Value Range Comment   Glucose-Capillary 136 (*) 70 - 99 mg/dL   GLUCOSE, CAPILLARY     Status: Abnormal   Collection Time   10/12/11  4:01 PM      Component Value Range Comment   Glucose-Capillary 105 (*) 70 - 99 mg/dL   GLUCOSE, CAPILLARY     Status: Abnormal   Collection Time   10/12/11  8:10 PM      Component Value Range Comment   Glucose-Capillary 143 (*) 70 -  99 mg/dL    Comment 1 Documented in Chart      Comment 2 Notify RN     GLUCOSE, CAPILLARY     Status: Normal   Collection Time   10/13/11 12:34 AM      Component Value Range Comment   Glucose-Capillary 97  70 - 99 mg/dL   GLUCOSE, CAPILLARY     Status: Abnormal   Collection Time   10/13/11  4:03 AM      Component Value Range Comment   Glucose-Capillary 106 (*) 70 - 99 mg/dL   CBC     Status: Abnormal   Collection Time   10/13/11  7:06 AM      Component Value Range Comment   WBC 12.4 (*) 4.0 - 10.5 K/uL    RBC 3.52 (*) 3.87 - 5.11 MIL/uL    Hemoglobin 9.1 (*) 12.0 - 15.0 g/dL    HCT 45.4 (*) 09.8 - 46.0 %    MCV 81.3  78.0 - 100.0 fL    MCH 25.9 (*) 26.0 - 34.0 pg    MCHC 31.8  30.0 - 36.0 g/dL    RDW 11.9 (*) 14.7 - 15.5 %    Platelets 106 (*) 150 - 400 K/uL CONSISTENT WITH PREVIOUS RESULT  GLUCOSE, CAPILLARY     Status: Abnormal   Collection Time   10/13/11  8:35 AM      Component Value Range Comment   Glucose-Capillary 114 (*) 70 - 99 mg/dL    Comment 1 Notify RN     URINALYSIS, ROUTINE W REFLEX  MICROSCOPIC     Status: Abnormal   Collection Time   10/13/11 10:00 AM      Component Value Range Comment   Color, Urine YELLOW  YELLOW    APPearance CLOUDY (*) CLEAR    Specific Gravity, Urine 1.013  1.005 - 1.030    pH 6.0  5.0 - 8.0    Glucose, UA NEGATIVE  NEGATIVE mg/dL    Hgb urine dipstick NEGATIVE  NEGATIVE    Bilirubin Urine NEGATIVE  NEGATIVE    Ketones, ur NEGATIVE  NEGATIVE mg/dL    Protein, ur 829 (*) NEGATIVE mg/dL    Urobilinogen, UA 0.2  0.0 - 1.0 mg/dL    Nitrite NEGATIVE  NEGATIVE    Leukocytes, UA TRACE (*) NEGATIVE   URINE MICROSCOPIC-ADD ON     Status: Abnormal   Collection Time   10/13/11 10:00 AM      Component Value Range Comment   Squamous Epithelial / LPF RARE  RARE    WBC, UA 3-6  <3 WBC/hpf CHECKED   RBC / HPF 0-2  <3 RBC/hpf    Bacteria, UA RARE  RARE    Casts HYALINE CASTS (*) NEGATIVE    Urine-Other LESS THAN 10 mL OF URINE SUBMITTED     GLUCOSE, CAPILLARY     Status: Abnormal   Collection Time   10/13/11 12:00 PM      Component Value Range Comment   Glucose-Capillary 125 (*) 70 - 99 mg/dL    Comment 1 Notify RN     GLUCOSE, CAPILLARY     Status: Abnormal   Collection Time   10/13/11  4:29 PM      Component Value Range Comment   Glucose-Capillary 131 (*) 70 - 99 mg/dL    Comment 1 Notify RN       Ct Abdomen Pelvis W Contrast  10/13/2011  *RADIOLOGY REPORT*  Clinical Data: Persistent leukocytosis, right lower abdominal  pain, hypertension, diabetes  CT ABDOMEN AND PELVIS WITH CONTRAST  Technique:  Multidetector CT imaging of the abdomen and pelvis was performed following the standard protocol during bolus administration of intravenous contrast. Sagittal and coronal MPR images reconstructed from axial data set.  Contrast: 80mL OMNIPAQUE IOHEXOL 300 MG/ML  SOLN Dilute oral contrast.  Comparison: None  Findings: Right basilar atelectasis and tiny pleural effusion. Questionable filling defect in left lower lobe pulmonary artery image #1, cannot  exclude pulmonary embolism. VP shunt tubing and abdominal cavity. Liver, spleen, pancreas, left kidney, and adrenal glands normal appearance. Minimally enlarged right kidney with impaired nephrogram, delayed contrast excretion, and ureteral dilatation suggesting distal obstruction. Periureteral edema is seen to urinary bladder. No obstructing ureteral calcification identified. Free intraperitoneal fluid is present though the patient has a VP shunt.  Nonspecific presacral edema extending to perirectal fat. Left inguinal hernia containing fat. Question slightly increased density within gallbladder focally cannot exclude gallstone. Stomach decompressed. Large and small bowel loops grossly unremarkable. A thickened tubular structure seen extending into the cecum into the right pelvis, cannot exclude acute appendicitis. The patient had a prior hysterectomy and suspect left ovarian excision. Additional soft tissue in the right pelvis which could represent an ovary or be related to an inflammatory process. Surgical status of the appendix and right ovary is uncertain. Air in urinary bladder. Remaining bowel loops. No adenopathy, mass, or free intraperitoneal air. Single tiny focus of gas identified in inferior right pelvis, uncertain etiology, image 76. No acute osseous findings.  IMPRESSION: Inflammatory process in the right pelvis where amorphous soft tissue and diffuse tissue infiltration is identified, though uncertain if the by portion of this represents the right ovary. A thickened tubular structure is extending inferiorly from the cecum, image 68, potentially enlarged appendix. Surgical status of the appendix and right ovary is uncertain. Acute appendicitis is not excluded. Differential diagnosis however includes other inflammatory processes of pelvis. Free pelvic fluid could be related to the above inflammatory process or potentially VP shunt. Nonspecific presacral edema. Likely secondary right ureteral obstruction  with impaired right renal function.  Questionable filling defect in left lower lobe pulmonary artery, cannot exclude pulmonary embolism; recommend dedicated CTA imaging of the chest or radionuclide ventilation perfusion lung scan for further assessment.  Cannot exclude cholelithiasis. Left inguinal hernia containing fat.  Findings called to Dr. Cena Benton on 10/13/2011 at 1630 hours.   Original Report Authenticated By: Lollie Marrow, M.D.     Review of Systems  Constitutional: Negative for fever, chills and diaphoresis.  Respiratory: Negative for cough and sputum production.   Cardiovascular: Negative for chest pain.  Gastrointestinal: Negative for heartburn, nausea, vomiting, abdominal pain, diarrhea and constipation.  Genitourinary: Negative for dysuria.  Musculoskeletal: Negative for joint pain.  Skin: Negative for rash.  Neurological: Negative for headaches.   Blood pressure 133/78, pulse 68, temperature 98.5 F (36.9 C), temperature source Oral, resp. rate 24, height 5\' 5"  (1.651 m), weight 222 lb 14.2 oz (101.1 kg), SpO2 100.00%. Physical Exam  Constitutional: She is oriented to person, place, and time. She appears well-developed and well-nourished. No distress.  HENT:  Head: Normocephalic and atraumatic.  Eyes: Pupils are equal, round, and reactive to light. No scleral icterus.  Neck: Normal range of motion.  Cardiovascular: Normal rate and regular rhythm.   Respiratory: Effort normal.  GI: Soft. She exhibits no distension and no mass. There is no tenderness. There is no rebound and no guarding.  Neurological: She is alert and oriented to person,  place, and time.  Skin: Skin is warm and dry.    Assessment/Plan: Given her lack of abdominal pain, I think there is low likelihood that this is appendicitis.  The CT does look slightly suspicious, but I feel that if the fluid was inflammatory, she would have some other symptoms or physical exam finding.  Right now, I would recommend repearing  WBC in the AM, continuing the antibiotics and I will perform a follow up exam in the am to see if any pain develops.  Thank you for this consult.  Please let me know if you have any questions or concerns.  Doshia Dalia C. 10/13/2011, 5:06 PM

## 2011-10-14 ENCOUNTER — Inpatient Hospital Stay (HOSPITAL_COMMUNITY): Payer: Medicaid Other

## 2011-10-14 DIAGNOSIS — I2699 Other pulmonary embolism without acute cor pulmonale: Secondary | ICD-10-CM

## 2011-10-14 LAB — RHEUMATOID FACTOR: Rhuematoid fact SerPl-aCnc: 17 IU/mL — ABNORMAL HIGH (ref <=14)

## 2011-10-14 LAB — BASIC METABOLIC PANEL
BUN: 8 mg/dL (ref 6–23)
Chloride: 106 mEq/L (ref 96–112)
Creatinine, Ser: 1.03 mg/dL (ref 0.50–1.10)
GFR calc Af Amer: 66 mL/min — ABNORMAL LOW (ref >90)
GFR calc non Af Amer: 57 mL/min — ABNORMAL LOW (ref >90)
Potassium: 3.3 mEq/L — ABNORMAL LOW (ref 3.5–5.1)
Sodium: 141 mEq/L (ref 135–145)

## 2011-10-14 LAB — CULTURE, BLOOD (ROUTINE X 2): Culture: NO GROWTH

## 2011-10-14 LAB — CBC
MCHC: 31.1 g/dL (ref 30.0–36.0)
RDW: 17.5 % — ABNORMAL HIGH (ref 11.5–15.5)

## 2011-10-14 LAB — GLUCOSE, CAPILLARY
Glucose-Capillary: 118 mg/dL — ABNORMAL HIGH (ref 70–99)
Glucose-Capillary: 100 mg/dL — ABNORMAL HIGH (ref 70–99)

## 2011-10-14 LAB — C-REACTIVE PROTEIN: CRP: 10.3 mg/dL — ABNORMAL HIGH (ref <0.60)

## 2011-10-14 LAB — ANA: Anti Nuclear Antibody(ANA): NEGATIVE

## 2011-10-14 MED ORDER — HEPARIN (PORCINE) IN NACL 100-0.45 UNIT/ML-% IJ SOLN
1700.0000 [IU]/h | INTRAMUSCULAR | Status: DC
  Administered 2011-10-14 – 2011-10-16 (×3): 1400 [IU]/h via INTRAVENOUS
  Administered 2011-10-16: 1550 [IU]/h via INTRAVENOUS
  Administered 2011-10-17: 1700 [IU]/h via INTRAVENOUS
  Filled 2011-10-14 (×5): qty 250

## 2011-10-14 MED ORDER — IOHEXOL 350 MG/ML SOLN: 80.0000 mL | Freq: Once | INTRAVENOUS | Status: AC | PRN

## 2011-10-14 MED ORDER — HEPARIN BOLUS VIA INFUSION
2500.0000 [IU] | Freq: Once | INTRAVENOUS | Status: AC
  Administered 2011-10-14: 2500 [IU] via INTRAVENOUS
  Filled 2011-10-14: qty 2500

## 2011-10-14 NOTE — Progress Notes (Signed)
VASCULAR LAB PRELIMINARY  PRELIMINARY  PRELIMINARY  PRELIMINARY  Bilateral lower extremity venous duplex  completed.    Preliminary report:  Right:  Non occlusive DVT noted in the CFV, PFV and proximal FV .  No evidence of superficial thrombosis.  No Baker's cyst.  Left:  No evidence of DVT, superficial thrombosis, or Baker's cyst.  Francesca Strome, RVT 10/14/2011, 6:44 PM

## 2011-10-14 NOTE — Progress Notes (Signed)
[facility] PROGRESS NOTE  [name] ZOX:[id number] DOB: [birth year] DOA: 10/08/2011 PCP: No primary provider on file.  Assessment/Plan:  1. Acute and chronic bilateral extensive PE :       Pt is clinically stable. Discussed case with Dr. Rayburn Go. We will consider heparin if ok with pulmonary and neurosurgery in current condition (given h/o aneurysm, shunt and coiling). Get [place] of BLE to r/o DVT.  2. H/o subarachnoid hemorrhage with hydrocephalus: Dr. Phoebe Perch (neurosurgery) on board and will follow up with his recommendations. CT of head on 10/08/11 reported no acute findings. Did report however evolving encephalomalacia in the ACA territory. 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 Corliss Skains on 08/19/11. Per Neurosurgery likely hood of VP shunt infection low. 3. Seizure d/o: Recommendations per neurology. continue Keppra. Should patient have any more seizure like activity will plan on contacting neurology for further recommendations. 4. UTI: U/A on 10/05 showed hazy urine with large hgb and many bacteria. Urine culture with no growth. WBC elevated despite being on Levaquin so continue Rocephin and follow UA. Vancomycin discontinued on 10/08. Will recheck cbc next am. Patient is afebrile within the last 48 hours. 5. DM: Blood sugars relatively well controlled at this point. Will continue SSI and will advance diet to diabetic diet  6. Hypertension: Home blood pressure medication was held initially most likely due to lower blood pressure readings on admission. At this point patient has required hydralazine and labetalol prn. continue home blood pressure medication regimen. Will continue the hydralazine prn elevated blood pressures. BP is high today.  7. Decreased oral intake: Po intake improved. Will f/u with dieticians recommendations. 8. Leukocytosis: Source? At this point suspected UTI but U/A showed no nitrites or leukocytes. Patient's WBC has trended up despite being on IV  antibiotics. CT scan of the chest did not show any sources of infection. Neurosurg does not suspect neurological source based on their work up. Follow labs and CT results.   Code Status: full Family Communication: daughter was present in the room.  Disposition Plan: pending w/u   Consultants:  Surgery, urology, pulmonary  Procedures:  CT angio  Antibiotics: Ceftriaxone and MTNZ  HPI/Subjective: Pt denies any CP, SOB or any other symptoms.   Objective: Filed Vitals:   10/13/11 2352 10/14/11 0046 10/14/11 0426 10/14/11 0820  BP: 153/67 150/74 157/65 153/73  Pulse: 73 65 72 79  Temp:  98.8 F (37.1 C) 98.4 F (36.9 C) 98.7 F (37.1 C)  TempSrc:  Oral Oral Oral  Resp:  21 19 28   Height:      Weight:   100.8 kg (222 lb 3.6 oz)   SpO2:  98% 100% 100%    Intake/Output Summary (Last 24 hours) at 10/14/11 1111 Last data filed at 10/14/11 0700  Gross per 24 hour  Intake   1275 ml  Output      0 ml  Net   1275 ml   Filed Weights   10/12/11 0429 10/13/11 0417 10/14/11 0426  Weight: 99.9 kg (220 lb 3.8 oz) 101.1 kg (222 lb 14.2 oz) 100.8 kg (222 lb 3.6 oz)    Exam:   General: Alert, sitting in a chair, oriented x 3, NAD, brushing her teeth  Cardiovascular: S1, S2 regular  Respiratory: good AE bil, no wheezing Abdomen: NT, ND, BS + Skin : rash or bruises.   Data Reviewed: Basic Metabolic Panel:  Lab 10/13/11 8119 10/10/11 0656 10/08/11 1856  NA 136 144 137  K 3.5  3.3* 3.3*  CL 104 109 101  CO2 21 23 23   GLUCOSE 94 111* 188*  BUN 12 24* 27*  CREATININE 1.14* 1.08 1.27*  CALCIUM 8.9 9.1 9.6  MG -- -- --  PHOS -- -- --   Liver Function Tests:  Lab 10/13/11 2138 10/08/11 1856  AST 31 42*  ALT 19 29  ALKPHOS 60 129*  BILITOT 0.5 1.1  PROT 6.1 7.5  ALBUMIN 2.4* 2.7*   No results found for this basename: LIPASE:5,AMYLASE:5 in the last 168 hours No results found for this basename: AMMONIA:5 in the last 168 hours CBC:  Lab 10/14/11 0422 10/13/11 2138  10/13/11 0706 10/12/11 0600 10/10/11 0656 10/08/11 1856  WBC 11.9* 12.9* 12.4* 11.5* 9.9 --  NEUTROABS -- -- -- -- -- 7.0  HGB 8.8* 9.4* 9.1* 9.0* 9.7* --  HCT 28.3* 29.4* 28.6* 28.4* 30.8* --  MCV 80.6 80.5 81.3 81.6 82.1 --  PLT 151 136* 106* 85* 85* --   Cardiac Enzymes: No results found for this basename: CKTOTAL:5,CKMB:5,CKMBINDEX:5,TROPONINI:5 in the last 168 hours BNP (last 3 results) No results found for this basename: PROBNP:3 in the last 8760 hours CBG:  Lab 10/14/11 0820 10/14/11 0111 10/13/11 2043 10/13/11 1629 10/13/11 1200  GLUCAP 97 118* 92 131* 125*    Recent Results (from the past 240 hour(s))  CULTURE, BLOOD (ROUTINE X 2)     Status: Normal   Collection Time   10/08/11  6:56 PM      Component Value Range Status Comment   Specimen Description BLOOD LEFT ANTECUBITAL   Final    Special Requests BOTTLES DRAWN AEROBIC AND ANAEROBIC 6CC   Final    Culture NO GROWTH 6 DAYS   Final    Report Status 10/14/2011 FINAL   Final   CULTURE, BLOOD (ROUTINE X 2)     Status: Normal   Collection Time   10/08/11  7:15 PM      Component Value Range Status Comment   Specimen Description BLOOD LEFT HAND   Final    Special Requests BOTTLES DRAWN AEROBIC AND ANAEROBIC 6CC   Final    Culture NO GROWTH 6 DAYS   Final    Report Status 10/14/2011 FINAL   Final   URINE CULTURE     Status: Normal   Collection Time   10/08/11  8:04 PM      Component Value Range Status Comment   Specimen Description URINE, CATHETERIZED   Final    Special Requests NONE   Final    Culture  Setup Time 10/09/2011 21:43   Final    Colony Count NO GROWTH   Final    Culture NO GROWTH   Final    Report Status 10/11/2011 FINAL   Final   MRSA PCR SCREENING     Status: Normal   Collection Time   10/09/11  1:55 AM      Component Value Range Status Comment   MRSA by PCR NEGATIVE  NEGATIVE Final      Studies: Ct Abdomen Pelvis W Contrast  10/13/2011  *RADIOLOGY REPORT*  Clinical Data: Persistent leukocytosis,  right lower abdominal pain, hypertension, diabetes  CT ABDOMEN AND PELVIS WITH CONTRAST  Technique:  Multidetector CT imaging of the abdomen and pelvis was performed following the standard protocol during bolus administration of intravenous contrast. Sagittal and coronal MPR images reconstructed from axial data set.  Contrast: 80mL OMNIPAQUE IOHEXOL 300 MG/ML  SOLN Dilute oral contrast.  Comparison: None  Findings: Right basilar  atelectasis and tiny pleural effusion. Questionable filling defect in left lower lobe pulmonary artery image #1, cannot exclude pulmonary embolism. VP shunt tubing and abdominal cavity. Liver, spleen, pancreas, left kidney, and adrenal glands normal appearance. Minimally enlarged right kidney with impaired nephrogram, delayed contrast excretion, and ureteral dilatation suggesting distal obstruction. Periureteral edema is seen to urinary bladder. No obstructing ureteral calcification identified. Free intraperitoneal fluid is present though the patient has a VP shunt.  Nonspecific presacral edema extending to perirectal fat. Left inguinal hernia containing fat. Question slightly increased density within gallbladder focally cannot exclude gallstone. Stomach decompressed. Large and small bowel loops grossly unremarkable. A thickened tubular structure seen extending into the cecum into the right pelvis, cannot exclude acute appendicitis. The patient had a prior hysterectomy and suspect left ovarian excision. Additional soft tissue in the right pelvis which could represent an ovary or be related to an inflammatory process. Surgical status of the appendix and right ovary is uncertain. Air in urinary bladder. Remaining bowel loops. No adenopathy, mass, or free intraperitoneal air. Single tiny focus of gas identified in inferior right pelvis, uncertain etiology, image 76. No acute osseous findings.  IMPRESSION: Inflammatory process in the right pelvis where amorphous soft tissue and diffuse tissue  infiltration is identified, though uncertain if the by portion of this represents the right ovary. A thickened tubular structure is extending inferiorly from the cecum, image 68, potentially enlarged appendix. Surgical status of the appendix and right ovary is uncertain. Acute appendicitis is not excluded. Differential diagnosis however includes other inflammatory processes of pelvis. Free pelvic fluid could be related to the above inflammatory process or potentially VP shunt. Nonspecific presacral edema. Likely secondary right ureteral obstruction with impaired right renal function.  Questionable filling defect in left lower lobe pulmonary artery, cannot exclude pulmonary embolism; recommend dedicated CTA imaging of the chest or radionuclide ventilation perfusion lung scan for further assessment.  Cannot exclude cholelithiasis. Left inguinal hernia containing fat.  Findings called to Dr. Cena Benton on 10/13/2011 at 1630 hours.   Original Report Authenticated By: Lollie Marrow, M.D.     Scheduled Meds:   . antiseptic oral rinse  15 mL Mouth Rinse q12n4p  . cefTRIAXone (ROCEPHIN)  IV  1 g Intravenous Q24H  . chlorhexidine  15 mL Mouth/Throat BID  . feeding supplement  1 Container Oral TID BM  . insulin aspart  0-9 Units Subcutaneous Q4H  . iohexol  20 mL Oral Q1 Hr x 2  . labetalol  200 mg Oral TID  . levETIRAcetam  500 mg Oral BID  . lisinopril  10 mg Oral Daily  . metronidazole  500 mg Intravenous Q8H  . multivitamin with minerals  1 tablet Oral Daily  . pantoprazole  40 mg Oral QHS  . sodium chloride  1,000 mL Intravenous Once  . sodium chloride  500 mL Intravenous Once  . sodium chloride  3 mL Intravenous Q12H  . DISCONTD: levofloxacin  500 mg Oral Daily   Continuous Infusions:   . dextrose 5 % and 0.45 % NaCl with KCl 20 mEq/L 50 mL/hr at 10/14/11 0030    Principal Problem:  *Fever Active Problems:  HTN (hypertension)  SAH (subarachnoid hemorrhage) 08/16/11  Seizure disorder, seizure  09/21/11 while in rehab  VP (ventriculoperitoneal) shunt status  Diabetes mellitus, type 2  UTI (lower urinary tract infection)  Decreased oral intake  Leukocytosis    Time spent: 45 minutes   Zarria Towell V.  [facility] Pager [phone]. If 8PM-8AM, please contact night-coverage  at [email or web address], password [facility] 10/14/2011, 11:11 AM  LOS: 6 days

## 2011-10-14 NOTE — Progress Notes (Signed)
Dr. Joneen Roach was text paged about CT angio of chest cannot be done due to creatinine is up.

## 2011-10-14 NOTE — Progress Notes (Signed)
Clinical Social Work  CSW attempted to meet with patient but patient out of room. RN reports husband went home to rest. CSW will follow up on Monday to continue to assist with dc planning.  [place], [place] [phone]

## 2011-10-14 NOTE — Progress Notes (Signed)
Pts. Lab. drawn resulted, V-Q scan will be not be done till am. Dr. Joneen Roach was notified, since pt. Has #20 gauge placed by IV team, CT of chest can be done per Dr. Joneen Roach. Radioly was called and the radiologist sated that scan cannot be done due to pts. Creatinine is up. Needs to hydrate pt. and plan to do it in am.

## 2011-10-14 NOTE — Progress Notes (Signed)
Fever/Leukocystosis  Subjective: This is a 61 y.o.Marland Kitchen female who I saw last night due to concern for appendicitis.  She still denies any abd pain, nausea or GI distress.  No fevers  Objective: Vital signs in last 24 hours: Temp:  [98.3 F (36.8 C)-98.8 F (37.1 C)] 98.4 F (36.9 C) (10/11 0426) Pulse Rate:  [65-81] 72  (10/11 0426) Resp:  [19-28] 19  (10/11 0426) BP: (133-164)/(65-78) 157/65 mmHg (10/11 0426) SpO2:  [98 %-100 %] 100 % (10/11 0426) Weight:  [222 lb 3.6 oz (100.8 kg)] 222 lb 3.6 oz (100.8 kg) (10/11 0426) Last BM Date: 10/13/11  Intake/Output from previous day: 10/10 0701 - 10/11 0700 In: 2090 [P.O.:890; I.V.:950; IV Piggyback:250] Out: 950 [Urine:950] Intake/Output this shift:    General appearance: alert, cooperative and no distress GI: soft, nontender to palpation  Lab Results:  Results for orders placed during the hospital encounter of 10/08/11 (from the past 24 hour(s))  GLUCOSE, CAPILLARY     Status: Abnormal   Collection Time   10/13/11  8:35 AM      Component Value Range   Glucose-Capillary 114 (*) 70 - 99 mg/dL   Comment 1 Notify RN    URINALYSIS, ROUTINE W REFLEX MICROSCOPIC     Status: Abnormal   Collection Time   10/13/11 10:00 AM      Component Value Range   Color, Urine YELLOW  YELLOW   APPearance CLOUDY (*) CLEAR   Specific Gravity, Urine 1.013  1.005 - 1.030   pH 6.0  5.0 - 8.0   Glucose, UA NEGATIVE  NEGATIVE mg/dL   Hgb urine dipstick NEGATIVE  NEGATIVE   Bilirubin Urine NEGATIVE  NEGATIVE   Ketones, ur NEGATIVE  NEGATIVE mg/dL   Protein, ur 409 (*) NEGATIVE mg/dL   Urobilinogen, UA 0.2  0.0 - 1.0 mg/dL   Nitrite NEGATIVE  NEGATIVE   Leukocytes, UA TRACE (*) NEGATIVE  URINE MICROSCOPIC-ADD ON     Status: Abnormal   Collection Time   10/13/11 10:00 AM      Component Value Range   Squamous Epithelial / LPF RARE  RARE   WBC, UA 3-6  <3 WBC/hpf   RBC / HPF 0-2  <3 RBC/hpf   Bacteria, UA RARE  RARE   Casts HYALINE CASTS (*)  NEGATIVE   Urine-Other LESS THAN 10 mL OF URINE SUBMITTED    GLUCOSE, CAPILLARY     Status: Abnormal   Collection Time   10/13/11 12:00 PM      Component Value Range   Glucose-Capillary 125 (*) 70 - 99 mg/dL   Comment 1 Notify RN    GLUCOSE, CAPILLARY     Status: Abnormal   Collection Time   10/13/11  4:29 PM      Component Value Range   Glucose-Capillary 131 (*) 70 - 99 mg/dL   Comment 1 Notify RN    SEDIMENTATION RATE     Status: Abnormal   Collection Time   10/13/11  6:49 PM      Component Value Range   Sed Rate 90 (*) 0 - 22 mm/hr  C-REACTIVE PROTEIN     Status: Abnormal   Collection Time   10/13/11  6:49 PM      Component Value Range   CRP 10.3 (*) <0.60 mg/dL  RHEUMATOID FACTOR     Status: Abnormal   Collection Time   10/13/11  6:49 PM      Component Value Range   Rheumatoid Factor 17 (*) <=14  IU/mL  LACTATE DEHYDROGENASE     Status: Abnormal   Collection Time   10/13/11  6:49 PM      Component Value Range   LDH 306 (*) 94 - 250 U/L  GLUCOSE, CAPILLARY     Status: Normal   Collection Time   10/13/11  8:43 PM      Component Value Range   Glucose-Capillary 92  70 - 99 mg/dL   Comment 1 Documented in Chart    D-DIMER, QUANTITATIVE     Status: Abnormal   Collection Time   10/13/11  9:38 PM      Component Value Range   D-Dimer, Quant 14.71 (*) 0.00 - 0.48 ug/mL-FEU  COMPREHENSIVE METABOLIC PANEL     Status: Abnormal   Collection Time   10/13/11  9:38 PM      Component Value Range   Sodium 136  135 - 145 mEq/L   Potassium 3.5  3.5 - 5.1 mEq/L   Chloride 104  96 - 112 mEq/L   CO2 21  19 - 32 mEq/L   Glucose, Bld 94  70 - 99 mg/dL   BUN 12  6 - 23 mg/dL   Creatinine, Ser 8.11 (*) 0.50 - 1.10 mg/dL   Calcium 8.9  8.4 - 91.4 mg/dL   Total Protein 6.1  6.0 - 8.3 g/dL   Albumin 2.4 (*) 3.5 - 5.2 g/dL   AST 31  0 - 37 U/L   ALT 19  0 - 35 U/L   Alkaline Phosphatase 60  39 - 117 U/L   Total Bilirubin 0.5  0.3 - 1.2 mg/dL   GFR calc non Af Amer 50 (*) >90 mL/min     GFR calc Af Amer 58 (*) >90 mL/min  PROTIME-INR     Status: Normal   Collection Time   10/13/11  9:38 PM      Component Value Range   Prothrombin Time 15.2  11.6 - 15.2 seconds   INR 1.22  0.00 - 1.49  APTT     Status: Normal   Collection Time   10/13/11  9:38 PM      Component Value Range   aPTT 35  24 - 37 seconds  CBC     Status: Abnormal   Collection Time   10/13/11  9:38 PM      Component Value Range   WBC 12.9 (*) 4.0 - 10.5 K/uL   RBC 3.65 (*) 3.87 - 5.11 MIL/uL   Hemoglobin 9.4 (*) 12.0 - 15.0 g/dL   HCT 78.2 (*) 95.6 - 21.3 %   MCV 80.5  78.0 - 100.0 fL   MCH 25.8 (*) 26.0 - 34.0 pg   MCHC 32.0  30.0 - 36.0 g/dL   RDW 08.6 (*) 57.8 - 46.9 %   Platelets 136 (*) 150 - 400 K/uL  GLUCOSE, CAPILLARY     Status: Abnormal   Collection Time   10/14/11  1:11 AM      Component Value Range   Glucose-Capillary 118 (*) 70 - 99 mg/dL  CBC     Status: Abnormal   Collection Time   10/14/11  4:22 AM      Component Value Range   WBC 11.9 (*) 4.0 - 10.5 K/uL   RBC 3.51 (*) 3.87 - 5.11 MIL/uL   Hemoglobin 8.8 (*) 12.0 - 15.0 g/dL   HCT 62.9 (*) 52.8 - 41.3 %   MCV 80.6  78.0 - 100.0 fL   MCH 25.1 (*) 26.0 -  34.0 pg   MCHC 31.1  30.0 - 36.0 g/dL   RDW 16.1 (*) 09.6 - 04.5 %   Platelets 151  150 - 400 K/uL     Studies/Results Radiology     MEDS, Scheduled    . antiseptic oral rinse  15 mL Mouth Rinse q12n4p  . cefTRIAXone (ROCEPHIN)  IV  1 g Intravenous Q24H  . chlorhexidine  15 mL Mouth/Throat BID  . feeding supplement  1 Container Oral TID BM  . insulin aspart  0-9 Units Subcutaneous Q4H  . iohexol  20 mL Oral Q1 Hr x 2  . labetalol  200 mg Oral TID  . levETIRAcetam  500 mg Oral BID  . lisinopril  10 mg Oral Daily  . metronidazole  500 mg Intravenous Q8H  . multivitamin with minerals  1 tablet Oral Daily  . pantoprazole  40 mg Oral QHS  . sodium chloride  1,000 mL Intravenous Once  . sodium chloride  500 mL Intravenous Once  . sodium chloride  3 mL  Intravenous Q12H  . DISCONTD: levofloxacin  500 mg Oral Daily  . DISCONTD: megestrol  40 mg Oral Daily     Assessment: Luekocytosis  Plan: With that CT scan, I would except significant pain and rising wbc to suspect appendicitis.  THe patient still has no abd pain and her wbc is decreasing.  I do not feel that she has appendicitis.  I will sign off.  Please call with any questions or concerns.  I appreciate you allowing [place] to participate in her care.   LOS: 6 days    Vanita Panda, MD [facility], [place] [phone]   10/14/2011 7:47 AM

## 2011-10-14 NOTE — Consult Note (Signed)
Pulmonary/Critical Care Medicine Consult  Reason for Consult: Acute on chronic bilateral extensive PE Referring Physician: Dr. Joeseph Amor  [name] is an 61 y.o. female.   Lines/tubes: Peripheral IV   Microbiology/Sepsis markers: Results for orders placed during the hospital encounter of 10/08/11  CULTURE, BLOOD (ROUTINE X 2)     Status: Normal   Collection Time   10/08/11  6:56 PM      Component Value Range Status Comment   Specimen Description BLOOD LEFT ANTECUBITAL   Final    Special Requests BOTTLES DRAWN AEROBIC AND ANAEROBIC 6CC   Final    Culture NO GROWTH 6 DAYS   Final    Report Status 10/14/2011 FINAL   Final   CULTURE, BLOOD (ROUTINE X 2)     Status: Normal   Collection Time   10/08/11  7:15 PM      Component Value Range Status Comment   Specimen Description BLOOD LEFT HAND   Final    Special Requests BOTTLES DRAWN AEROBIC AND ANAEROBIC 6CC   Final    Culture NO GROWTH 6 DAYS   Final    Report Status 10/14/2011 FINAL   Final   URINE CULTURE     Status: Normal   Collection Time   10/08/11  8:04 PM      Component Value Range Status Comment   Specimen Description URINE, CATHETERIZED   Final    Special Requests NONE   Final    Culture  Setup Time 10/09/2011 21:43   Final    Colony Count NO GROWTH   Final    Culture NO GROWTH   Final    Report Status 10/11/2011 FINAL   Final   MRSA PCR SCREENING     Status: Normal   Collection Time   10/09/11  1:55 AM      Component Value Range Status Comment   MRSA by PCR NEGATIVE  NEGATIVE Final     Anti-infectives:  Anti-infectives     Start     Dose/Rate Route Frequency Ordered Stop   10/13/11 1800   metroNIDAZOLE (FLAGYL) IVPB 500 mg        500 mg 100 mL/hr over 60 Minutes Intravenous Every 8 hours 10/13/11 1649     10/13/11 1100   cefTRIAXone (ROCEPHIN) 1 g in dextrose 5 % 50 mL IVPB        1 g 100 mL/hr over 30 Minutes Intravenous Every 24 hours 10/13/11 0934     10/11/11 1000   levofloxacin (LEVAQUIN) tablet  500 mg  Status:  Discontinued        500 mg Oral Daily 10/11/11 0910 10/13/11 1643   10/09/11 1000   cefTRIAXone (ROCEPHIN) 2 g in dextrose 5 % 50 mL IVPB  Status:  Discontinued        2 g 100 mL/hr over 30 Minutes Intravenous 2 times daily 10/08/11 2251 10/11/11 0909   10/09/11 0600   vancomycin (VANCOCIN) IVPB 1000 mg/200 mL premix  Status:  Discontinued        1,000 mg 200 mL/hr over 60 Minutes Intravenous Every 12 hours 10/09/11 0138 10/11/11 0909   10/09/11 0200   levofloxacin (LEVAQUIN) IVPB 500 mg  Status:  Discontinued        500 mg 100 mL/hr over 60 Minutes Intravenous Daily at bedtime 10/09/11 0130 10/11/11 0910   10/08/11 2350   dextrose 5 % with cefTRIAXone (ROCEPHIN) ADS Med     Comments: Corine Shelter, PENNY: cabinet override  10/08/11 2350 10/09/11 0020   10/08/11 2330   cefTRIAXone (ROCEPHIN) 2 g in dextrose 5 % 50 mL IVPB        2 g 100 mL/hr over 30 Minutes Intravenous  Once 10/08/11 2304 10/09/11 0019   10/08/11 2300   levofloxacin (LEVAQUIN) IVPB 500 mg  Status:  Discontinued        500 mg 100 mL/hr over 60 Minutes Intravenous Every 24 hours 10/08/11 2251 10/09/11 0130   10/08/11 1945   vancomycin (VANCOCIN) IVPB 1000 mg/200 mL premix        1,000 mg 200 mL/hr over 60 Minutes Intravenous  Once 10/08/11 1939 10/08/11 2122   10/08/11 1945  piperacillin-tazobactam (ZOSYN) IVPB 3.375 g       3.375 g 12.5 mL/hr over 240 Minutes Intravenous  Once 10/08/11 1939 10/08/11 2020   10/08/11 1945   clindamycin (CLEOCIN) 900 mg in dextrose 5 % 100 mL IVPB        900 mg 200 mL/hr over 30 Minutes Intravenous  Once 10/08/11 1939 10/08/11 2135         Best Practice/Protocols:  VTE Prophylaxis: Mechanical GI Prophylaxis: Proton Pump Inhibitor  Consults: Treatment Team:  Maeola Harman, MD   Studies: Ct Head Wo Contrast  10/08/2011  *RADIOLOGY REPORT*  Clinical Data: Status post embolization of anterior communicating artery aneurysm after subarachnoid hemorrhage.   CT HEAD WITHOUT CONTRAST  Technique:  Contiguous axial images were obtained from the base of the skull through the vertex without contrast.  Comparison: 09/21/2011  Findings: Stable coarse and positioning of ventriculostomy catheter.  No evidence of hydrocephalus or intraventricular blood. Evolving encephalomalacia in the left ACA territory and inferior right frontal lobe without progressive infarction since the prior study.  No evidence of acute hemorrhage, extra-axial fluid collections or mass effect.  The skull is unremarkable.  Aneurysm coils again identified.  IMPRESSION: No acute findings.  Evolving encephalomalacia in the ACA territory of the brain.   Original Report Authenticated By: Reola Calkins, M.D.    Ct Head Wo Contrast  09/21/2011  *RADIOLOGY REPORT*  Clinical Data: Seizure  CT HEAD WITHOUT CONTRAST  Technique:  Contiguous axial images were obtained from the base of the skull through the vertex without contrast.  Comparison: 09/12/2011  Findings: There is a right frontal ventriculostomy catheter with tip terminating in the region of the third ventricle.  The ventricular volumes are within normal limits.  Subacute infarct involving bilateral frontal lobes, left corpus callosum and left basal ganglia are identified.  The appearance is unchanged from previous examination.  No evidence for acute infarct or intracranial hemorrhage. Aneurysm coil mass is unchanged. The mastoid air cells and the paranasal sinuses are clear.  The skull appears intact.  IMPRESSION:  1.  Stable exam.  No evidence for worsening infarction, new hemorrhage or hydrocephalus.   Original Report Authenticated By: Rosealee Albee, M.D.    Ct Chest Wo Contrast  10/08/2011  *RADIOLOGY REPORT*  Clinical Data: Fever.  CT CHEST WITHOUT CONTRAST  Technique:  Multidetector CT imaging of the chest was performed following the standard protocol without IV contrast.  Comparison: Chest x-ray earlier today.  Findings: There is  cardiomegaly.  Bibasilar atelectasis present. No pleural effusions.  Aorta is tortuous, non-aneurysmal. No mediastinal, hilar, or axillary adenopathy.  Visualized thyroid and chest wall soft tissues unremarkable. Imaging into the upper abdomen shows no acute findings.   No acute bony abnormality.  IMPRESSION: Cardiomegaly, bibasilar atelectasis.   Original Report Authenticated By: Caryn Bee  G. Kearney Hard, M.D.    Ct Angio Chest Pe W/cm &/or Wo Cm  10/14/2011  *RADIOLOGY REPORT*  Clinical Data: Evaluate for possible pulmonary embolism.  Suspected filling defect in the pulmonary artery noted on recent CT of the abdomen and pelvis.  CT ANGIOGRAPHY CHEST  Technique:  Multidetector CT imaging of the chest using the standard protocol during bolus administration of intravenous contrast. Multiplanar reconstructed images including MIPs were obtained and reviewed to evaluate the vascular anatomy.  Contrast:  80 ml of Omnipaque 350.  Comparison: CT of the abdomen and pelvis 10/13/2011.  Chest CT 10/08/2011.  Findings:  Mediastinum: There are numerous filling defects throughout the pulmonary arterial tree bilaterally.  The largest defect is in the distal right main pulmonary artery, however, defects are present within the lobar, segmental and subsegmental sized branches bilaterally.  Many of these are eccentrically located and intimately associated with the wall of the vessels, suggesting chronicity, while other defects are more centrally located. Defects do not appear to be nonocclusive at this time.  Heart size is enlarged.  The pulmonic trunk does not appear to be dilated at this time, suggesting against pulmonary arterial hypertension. However, there is some right ventricular dilatation. There is no significant pericardial fluid, thickening or pericardial calcification. No pathologically enlarged mediastinal or hilar lymph nodes. Esophagus is unremarkable in appearance.  Lungs/Pleura: A small amount of dependent atelectasis is  noted in the lower lobes of the lungs bilaterally.  No acute consolidative airspace disease.  No pleural effusions.  No definite suspicious appearing pulmonary nodules or masses are identified.  Upper Abdomen: Unremarkable.  Musculoskeletal: There are no aggressive appearing lytic or blastic lesions noted in the visualized portions of the skeleton.  IMPRESSION: 1.  Study is positive for a large volume of pulmonary emboli throughout the lungs bilaterally, as detailed above.  Notably, there appears to be a combination of acute and chronic emboli present. 2.  Cardiomegaly with some mild right ventricular dilatation, but no dilatation of the pulmonic trunk at this time. Given the dilatation of the right ventricle, correlation for elevated right- sided heart pressures may be warranted.  These results were called by telephone on 10/14/2011 at 05:00 p.m. to Dr. Allena Katz, who verbally acknowledged these results.   Original Report Authenticated By: Florencia Reasons, M.D.    Ct Abdomen Pelvis W Contrast  10/13/2011  *RADIOLOGY REPORT*  Clinical Data: Persistent leukocytosis, right lower abdominal pain, hypertension, diabetes  CT ABDOMEN AND PELVIS WITH CONTRAST  Technique:  Multidetector CT imaging of the abdomen and pelvis was performed following the standard protocol during bolus administration of intravenous contrast. Sagittal and coronal MPR images reconstructed from axial data set.  Contrast: 80mL OMNIPAQUE IOHEXOL 300 MG/ML  SOLN Dilute oral contrast.  Comparison: None  Findings: Right basilar atelectasis and tiny pleural effusion. Questionable filling defect in left lower lobe pulmonary artery image #1, cannot exclude pulmonary embolism. VP shunt tubing and abdominal cavity. Liver, spleen, pancreas, left kidney, and adrenal glands normal appearance. Minimally enlarged right kidney with impaired nephrogram, delayed contrast excretion, and ureteral dilatation suggesting distal obstruction. Periureteral edema is seen  to urinary bladder. No obstructing ureteral calcification identified. Free intraperitoneal fluid is present though the patient has a VP shunt.  Nonspecific presacral edema extending to perirectal fat. Left inguinal hernia containing fat. Question slightly increased density within gallbladder focally cannot exclude gallstone. Stomach decompressed. Large and small bowel loops grossly unremarkable. A thickened tubular structure seen extending into the cecum into the right pelvis, cannot  exclude acute appendicitis. The patient had a prior hysterectomy and suspect left ovarian excision. Additional soft tissue in the right pelvis which could represent an ovary or be related to an inflammatory process. Surgical status of the appendix and right ovary is uncertain. Air in urinary bladder. Remaining bowel loops. No adenopathy, mass, or free intraperitoneal air. Single tiny focus of gas identified in inferior right pelvis, uncertain etiology, image 76. No acute osseous findings.  IMPRESSION: Inflammatory process in the right pelvis where amorphous soft tissue and diffuse tissue infiltration is identified, though uncertain if the by portion of this represents the right ovary. A thickened tubular structure is extending inferiorly from the cecum, image 68, potentially enlarged appendix. Surgical status of the appendix and right ovary is uncertain. Acute appendicitis is not excluded. Differential diagnosis however includes other inflammatory processes of pelvis. Free pelvic fluid could be related to the above inflammatory process or potentially VP shunt. Nonspecific presacral edema. Likely secondary right ureteral obstruction with impaired right renal function.  Questionable filling defect in left lower lobe pulmonary artery, cannot exclude pulmonary embolism; recommend dedicated CTA imaging of the chest or radionuclide ventilation perfusion lung scan for further assessment.  Cannot exclude cholelithiasis. Left inguinal hernia  containing fat.  Findings called to Dr. Cena Benton on 10/13/2011 at 1630 hours.   Original Report Authenticated By: Lollie Marrow, M.D.    Nm Myocar Multi W/spect W/wall Motion / Ef  09/26/2011  *RADIOLOGY REPORT*  Clinical Data:  61 year old female with coronary artery disease. The the a the  MYOCARDIAL IMAGING WITH SPECT (REST AND PHARMACOLOGIC-STRESS) GATED LEFT VENTRICULAR WALL MOTION STUDY LEFT VENTRICULAR EJECTION FRACTION  Technique:  Resting myocardial SPECT imaging was initially performed after intravenous administration of radiopharmaceutical. Myocardial SPECT was subsequently performed after additional radiopharmaceutical injection during pharmacologic-stress supervised by the Cardiology staff.  Quantitative gated imaging was also performed to evaluate left ventricular wall motion, and estimate left ventricular ejection fraction.  Radiopharmaceutical:  Tc-54m Myoview at rest and during stress.  Comparison: none  Findings:  Technique: Study is adequate.  Perfusion:  There are no relative decreased counts on stress or rest to suggest reversible ischemia or infarction.  Wall motion:   Hypokinesia of the lateral wall.  Left ventricular ejection fraction:  Calculated left ventricular ejection fraction =  15%  IMPRESSION:  1. No reversible ischemia or infarction. 2.  Lateral wall hypokinesia.  3.  Left ventricular ejection fraction equal 15%   Original Report Authenticated By: Genevive Bi, M.D.    Dg Chest Portable 1 View  10/08/2011  *RADIOLOGY REPORT*  Clinical Data: Seizure.  Fever.  PORTABLE CHEST - 1 VIEW  Comparison: 09/03/2011  Findings: Artifact overlies chest.  Shunt tube overlies the chest. There is a poor inspiration.  Basilar atelectasis or infiltrate is not excluded.  Upper lungs are clear.  No bony abnormalities seen.  IMPRESSION: Poor inspiration.  Cannot rule out atelectasis or infiltrate at the lung bases.  Otherwise negative.   Original Report Authenticated By: Thomasenia Sales,  M.D.    Dg Swallowing Func-speech Pathology  09/21/2011  FLUOROSCOPY FOR SWALLOWING FUNCTION STUDY  Fluoroscopy was provided for swallowing function study, which was a dministered by a speech pathologist.  Final results and recommendat ions from this study are contained within the speech pathology repo rt.   Original Report Authenticated By: Sharyn Lull    Events:  HPI: Patient is a 61 yo F with recent PMH of HTN, SAH with hydrocephalus requiring VP shunt in August 2013,  ACA aneurysm requiring coiling (also in August 2013) who was readmitted on 10/08/11 for increasing AMS and fever at [facility]. Throughout her hospitalization, she has been evaluated for source of fever, which included an abdominal CT scan. On that exam, it was noted that she may have PE therefore CTA was performed. That exam revealed bilateral acute on chronic non-obstructing pulmonary embolism. She had dopplers of her lower extremities that reveal DVT of CFV, PFV and proximal FV. Patient states she has never had a blood clot before. Pulmonology was consulted for further recommendations of treatment of PE.  Past Medical History  Diagnosis Date  . Hypertension     Past Surgical History  Procedure Date  . Abdominal hysterectomy   . Ventriculoperitoneal shunt 09/02/2011    Procedure: SHUNT INSERTION VENTRICULAR-PERITONEAL;  Surgeon: Maeola Harman, MD;  Location: [facility];  Service: Neurosurgery;  Laterality: Right;    No family history on file.  Social History:  reports that she has never smoked. She does not have any smokeless tobacco history on file. She reports that she does not drink alcohol or use illicit drugs.  Allergies:  Allergies  Allergen Reactions  . Lactose Intolerance (Gi) Other (See Comments)    G.I. Upset   Medications: I have reviewed the patient's current medications.  LABS Results for orders placed during the hospital encounter of 10/08/11 (from the past 24 hour(s))  GLUCOSE, CAPILLARY     Status: Normal    Collection Time   10/13/11  8:43 PM      Component Value Range   Glucose-Capillary 92  70 - 99 mg/dL   Comment 1 Documented in Chart    D-DIMER, QUANTITATIVE     Status: Abnormal   Collection Time   10/13/11  9:38 PM      Component Value Range   D-Dimer, Quant 14.71 (*) 0.00 - 0.48 ug/mL-FEU  COMPREHENSIVE METABOLIC PANEL     Status: Abnormal   Collection Time   10/13/11  9:38 PM      Component Value Range   Sodium 136  135 - 145 mEq/L   Potassium 3.5  3.5 - 5.1 mEq/L   Chloride 104  96 - 112 mEq/L   CO2 21  19 - 32 mEq/L   Glucose, Bld 94  70 - 99 mg/dL   BUN 12  6 - 23 mg/dL   Creatinine, Ser 1.61 (*) 0.50 - 1.10 mg/dL   Calcium 8.9  8.4 - 09.6 mg/dL   Total Protein 6.1  6.0 - 8.3 g/dL   Albumin 2.4 (*) 3.5 - 5.2 g/dL   AST 31  0 - 37 U/L   ALT 19  0 - 35 U/L   Alkaline Phosphatase 60  39 - 117 U/L   Total Bilirubin 0.5  0.3 - 1.2 mg/dL   GFR calc non Af Amer 50 (*) >90 mL/min   GFR calc Af Amer 58 (*) >90 mL/min  PROTIME-INR     Status: Normal   Collection Time   10/13/11  9:38 PM      Component Value Range   Prothrombin Time 15.2  11.6 - 15.2 seconds   INR 1.22  0.00 - 1.49  APTT     Status: Normal   Collection Time   10/13/11  9:38 PM      Component Value Range   aPTT 35  24 - 37 seconds  CBC     Status: Abnormal   Collection Time   10/13/11  9:38 PM  Component Value Range   WBC 12.9 (*) 4.0 - 10.5 K/uL   RBC 3.65 (*) 3.87 - 5.11 MIL/uL   Hemoglobin 9.4 (*) 12.0 - 15.0 g/dL   HCT 40.9 (*) 81.1 - 91.4 %   MCV 80.5  78.0 - 100.0 fL   MCH 25.8 (*) 26.0 - 34.0 pg   MCHC 32.0  30.0 - 36.0 g/dL   RDW 78.2 (*) 95.6 - 21.3 %   Platelets 136 (*) 150 - 400 K/uL  GLUCOSE, CAPILLARY     Status: Abnormal   Collection Time   10/14/11  1:11 AM      Component Value Range   Glucose-Capillary 118 (*) 70 - 99 mg/dL  CBC     Status: Abnormal   Collection Time   10/14/11  4:22 AM      Component Value Range   WBC 11.9 (*) 4.0 - 10.5 K/uL   RBC 3.51 (*) 3.87 - 5.11  MIL/uL   Hemoglobin 8.8 (*) 12.0 - 15.0 g/dL   HCT 08.6 (*) 57.8 - 46.9 %   MCV 80.6  78.0 - 100.0 fL   MCH 25.1 (*) 26.0 - 34.0 pg   MCHC 31.1  30.0 - 36.0 g/dL   RDW 62.9 (*) 52.8 - 41.3 %   Platelets 151  150 - 400 K/uL  GLUCOSE, CAPILLARY     Status: Normal   Collection Time   10/14/11  8:20 AM      Component Value Range   Glucose-Capillary 97  70 - 99 mg/dL   Comment 1 Documented in Chart     Comment 2 Notify RN    GLUCOSE, CAPILLARY     Status: Normal   Collection Time   10/14/11 12:17 PM      Component Value Range   Glucose-Capillary 98  70 - 99 mg/dL   Comment 1 Documented in Chart     Comment 2 Notify RN    BASIC METABOLIC PANEL     Status: Abnormal   Collection Time   10/14/11  1:54 PM      Component Value Range   Sodium 141  135 - 145 mEq/L   Potassium 3.3 (*) 3.5 - 5.1 mEq/L   Chloride 106  96 - 112 mEq/L   CO2 25  19 - 32 mEq/L   Glucose, Bld 102 (*) 70 - 99 mg/dL   BUN 8  6 - 23 mg/dL   Creatinine, Ser 2.44  0.50 - 1.10 mg/dL   Calcium 8.9  8.4 - 01.0 mg/dL   GFR calc non Af Amer 57 (*) >90 mL/min   GFR calc Af Amer 66 (*) >90 mL/min  GLUCOSE, CAPILLARY     Status: Abnormal   Collection Time   10/14/11  5:30 PM      Component Value Range   Glucose-Capillary 100 (*) 70 - 99 mg/dL   Diagnostics Review of Systems - Denies HA, CP, SOB, pain with inspiration, abd pain, Leg pain.   Blood pressure 168/78, pulse 79, temperature 98.4 F (36.9 C), temperature source Oral, resp. rate 18, height 5\' 5"  (1.651 m), weight 222 lb 3.6 oz (100.8 kg), SpO2 100.00%.  Physical Examination: General appearance - alert, well appearing, and in no distress and does not seem interested in talking Mental status - alert, oriented to person, place, and time HEENT - AT, [place]. MMM Neck - supple, no significant adenopathy Chest - clear to auscultation, no wheezes, rales or rhonchi, symmetric air entry, Good effort with no  difficulty. On room air. Heart - normal rate, regular rhythm,  normal S1, S2, no murmurs, rubs, clicks or gallops Abdomen - soft, nontender, nondistended, no masses or organomegaly Neurological - alert, oriented, normal speech, no focal findings or movement disorder noted Musculoskeletal - no joint tenderness, deformity or swelling, no muscular tenderness noted Extremities - no pedal edema noted, no TTP of lower extremities/calves Skin - normal coloration and turgor, no rashes, no suspicious skin lesions noted  Assessment and Plan   NEURO  Altered Mental Status:  change in mental status at admission   Plan:  - Known seizure d/o. Con't Keppra per Neuro  PULM  Pulmonary Embolism (radiographically demonstrated, chest CTA)   Plan: Bilateral extensive non-occlusive PE. No known history. Was recently d/c from hospital, but SCD documented throughout hospital stay. No vital sign instability; On RA, no tachycardia - Given known SAH and aneurysm, neuro consulted as well. Patient is more than 2 months out from surgery and should be safe for anticoagulation.  - Patient has history of gait instability and endorses recent falls - Will start heparin gtt. If patient tolerates well, we will transition to Lovenox  CARDIO  History of HTN. No tachycardia a this time   Plan:  - Monitor on tele. Vitals per floor protocol - Continue medications per primary team  RENAL  No acute issues   Plan:  - Creatinine stable - Replete K+ per primary team  GI  Decreased PO intake. R/O appendicitis   Plan:  - Dietary consult - Surgery signed off; fever not likely due to appendicitis   ID  Fevers, leukocytosis   Plan:  - Source unknown - Panculture - Continue IV abx - Monitor labs  HEME  No active issues at this time   Plan:  - Plan to check coags based on anticoagulation  ENDO Diabetes Mellitus (Type 2)   Plan:  - Con't SSI - Carb modified diet  Global Issues   Patient with bilateral acute on chronic PE as well as DVT evidenced on doppler. Although she has had  recent [facility] and is a fall risk, will start Heparin for anticoagulation given the risks vs benefits.    Amber M. Hairford, M.D.  Family Medicine PGY-2 10/14/2011, 6:58 PM   I have fully examined this patient and agree with above findings. Briefly 61 yo F with SAH complicated by hydrocephalus in 08/2011.  ACA aneurysm was coiled and VP shunt placed for hydrocephalus.  This admission she was found to have PE and DVT.  Patient asymptomatic, without O2 requirement and hemodynamically stable.  SAH > 2 months.  Feel that given distance from bleed feel that anticoagulation benefit outweighs risk.  Neuro aware and in agreement.  Will start heparin and if without issue over 24-48 hours can transition to Lovenox/Coumadin.  Tereka Thorley, M.D.  Pulmonary and Critical Care Medicine  [facility]  Pager: [phone]

## 2011-10-14 NOTE — Progress Notes (Signed)
ANTICOAGULATION CONSULT NOTE - Initial Consult  Pharmacy Consult for heparin Indication: pulmonary embolus/DVT  Allergies  Allergen Reactions  . Lactose Intolerance (Gi) Other (See Comments)    G.I. Upset    Patient Measurements: Height: 5\' 5"  (165.1 cm) Weight: 222 lb 3.6 oz (100.8 kg) IBW/kg (Calculated) : 57  Heparin Dosing Weight: 80  Vital Signs: Temp: 98.4 F (36.9 C) (10/11 1700) Temp src: Oral (10/11 1700) BP: 136/69 mmHg (10/11 2006) Pulse Rate: 72  (10/11 2006)  Labs:  Basename 10/14/11 1354 10/14/11 0422 10/13/11 2138 10/13/11 0706  HGB -- 8.8* 9.4* --  HCT -- 28.3* 29.4* 28.6*  PLT -- 151 136* 106*  APTT -- -- 35 --  LABPROT -- -- 15.2 --  INR -- -- 1.22 --  HEPARINUNFRC -- -- -- --  CREATININE 1.03 -- 1.14* --  CKTOTAL -- -- -- --  CKMB -- -- -- --  TROPONINI -- -- -- --    Estimated Creatinine Clearance: 65.8 ml/min (by C-G formula based on Cr of 1.03).   Medical History: Past Medical History  Diagnosis Date  . Hypertension     Medications:  Scheduled:    . antiseptic oral rinse  15 mL Mouth Rinse q12n4p  . cefTRIAXone (ROCEPHIN)  IV  1 g Intravenous Q24H  . chlorhexidine  15 mL Mouth/Throat BID  . feeding supplement  1 Container Oral TID BM  . insulin aspart  0-9 Units Subcutaneous Q4H  . labetalol  200 mg Oral TID  . levETIRAcetam  500 mg Oral BID  . lisinopril  10 mg Oral Daily  . metronidazole  500 mg Intravenous Q8H  . multivitamin with minerals  1 tablet Oral Daily  . pantoprazole  40 mg Oral QHS  . sodium chloride  1,000 mL Intravenous Once  . sodium chloride  500 mL Intravenous Once  . sodium chloride  3 mL Intravenous Q12H      [name] 10/14/2011,8:12 PM

## 2011-10-15 LAB — HEPARIN LEVEL (UNFRACTIONATED): Heparin Unfractionated: 0.35 IU/mL (ref 0.30–0.70)

## 2011-10-15 LAB — CBC
HCT: 26.3 % — ABNORMAL LOW (ref 36.0–46.0)
MCHC: 31.9 g/dL (ref 30.0–36.0)
Platelets: 188 10*3/uL (ref 150–400)
RDW: 17.7 % — ABNORMAL HIGH (ref 11.5–15.5)
WBC: 13.1 10*3/uL — ABNORMAL HIGH (ref 4.0–10.5)

## 2011-10-15 LAB — GLUCOSE, CAPILLARY
Glucose-Capillary: 119 mg/dL — ABNORMAL HIGH (ref 70–99)
Glucose-Capillary: 132 mg/dL — ABNORMAL HIGH (ref 70–99)

## 2011-10-15 MED ORDER — INSULIN GLARGINE 100 UNIT/ML ~~LOC~~ SOLN
4.0000 [IU] | Freq: Every day | SUBCUTANEOUS | Status: DC
  Administered 2011-10-15 – 2011-10-18 (×4): 4 [IU] via SUBCUTANEOUS

## 2011-10-15 NOTE — Progress Notes (Signed)
ANTICOAGULATION CONSULT NOTE - Follow Up Consult  Pharmacy Consult for heparin Indication: pulmonary embolus and DVT  Labs:  Basename 10/15/11 0301 10/14/11 1354 10/14/11 0422 10/13/11 2138  HGB 8.4* -- 8.8* --  HCT 26.3* -- 28.3* 29.4*  PLT 188 -- 151 136*  APTT -- -- -- 35  LABPROT -- -- -- 15.2  INR -- -- -- 1.22  HEPARINUNFRC 0.35 -- -- --  CREATININE -- 1.03 -- 1.14*  CKTOTAL -- -- -- --  CKMB -- -- -- --  TROPONINI -- -- -- --    Assessment/Plan:  61yo female therapeutic on heparin for PE/DVT though at low end of goal; will continue gtt at current rate for now and confirm stable with additional level.  Colleen Can PharmD BCPS 10/15/2011,4:15 AM

## 2011-10-15 NOTE — Progress Notes (Addendum)
[facility] PROGRESS NOTE  [name] ZOX:[id number] DOB: 1950-07-24 DOA: 10/08/2011 PCP: No primary provider on file.  Assessment/Plan: 1. Acute and chronic bilateral extensive PE : Pt is clinically stable. Continue heparin and monitor carefully due to  h/o aneurysm, shunt and coiling, USBLE showed VT in LE also. Consider coumadin (reversible if needed in future) for outpatient use. Pharmacy to manage anticoagulation 2.   Hypokalemia : replacing with IV fluids  2.  H/o subarachnoid hemorrhage with hydrocephalus: Dr. Phoebe Perch (neurosurgery) on board and will follow up with his recommendations. CT of head on 10/08/11 reported no acute findings. Did report however evolving encephalomalacia in the ACA territory. 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 Corliss Skains on 08/19/11. Per Neurosurgery likely hood of VP shunt infection low. 3. Seizure d/o: Recommendations per neurology. continue Keppra. Should patient have any more seizure like activity will plan on contacting neurology for further recommendations. 4. UTI: U/A on 10/05 showed hazy urine with large hgb and many bacteria. Urine culture with no growth. WBC elevated despite being on Levaquin. Now ceftriaxone and MTNZ stopped today as pt is stable and no further infection identified. Vancomycin discontinued on 10/08. Will recheck cbc next am. Patient is afebrile within the last 72 hours. 5.  DM: Blood sugars relatively well controlled at this point. Diabetic diet and home lantus started. Other insulin orders stopped due to good levels. CBG (last 3)   Basename 10/15/11 1310 10/15/11 0818 10/15/11 0400  GLUCAP 132* 100* 107*  Hypertension: Home blood pressure medication was held initially most likely due to lower blood pressure readings on admission. At this point patient has required hydralazine and labetalol prn. continue home blood pressure medication regimen. Will continue the hydralazine prn elevated blood pressures. BP is high  today.  6. Decreased oral intake: Po intake improved. Will f/u with dieticians recommendations. 7. Leukocytosis: Source? At this point suspected PE, DVT. Monitor the trend.  CT scan of the chest did not show any sources of infection. Neurosurg does not suspect neurological source based on their work up.   Code Status: full  Family Communication: relatives present in the room and discussed with them Disposition Plan: pending w/u   Consultants:  Surgery, uro, pulm  Procedures:  CT angio  Antibiotics:  Ceftriaxone and metro d/c on 10/12  HPI/Subjective: Pt is feeling stable. Using urinal at bedside if needed. No SOB or CP  Objective: Filed Vitals:   10/15/11 0000 10/15/11 0400 10/15/11 0500 10/15/11 0830  BP: 145/66 156/71  173/76  Pulse: 72 64  65  Temp:  98.3 F (36.8 C)  98.8 F (37.1 C)  TempSrc:  Oral  Oral  Resp: 22 21  23   Height:      Weight:   97 kg (213 lb 13.5 oz)   SpO2: 99% 99%  100%    Intake/Output Summary (Last 24 hours) at 10/15/11 0852 Last data filed at 10/15/11 0600  Gross per 24 hour  Intake 1556.4 ml  Output      0 ml  Net 1556.4 ml   Filed Weights   10/13/11 0417 10/14/11 0426 10/15/11 0500  Weight: 101.1 kg (222 lb 14.2 oz) 100.8 kg (222 lb 3.6 oz) 97 kg (213 lb 13.5 oz)    Exam: General: Alert, sitting in bed and eating dinner, has flat affect, oriented x 3, NAD Cardiovascular: S1, S2 regular, murmur present Respiratory: good AE bil, no wheezing Abdomen: NT, ND, BS +  Skin : rash or bruises.  Data Reviewed: Basic Metabolic Panel:  Lab 10/14/11 4540 10/13/11 2138 10/10/11 0656 10/08/11 1856  NA 141 136 144 137  K 3.3* 3.5 3.3* 3.3*  CL 106 104 109 101  CO2 25 21 23 23   GLUCOSE 102* 94 111* 188*  BUN 8 12 24* 27*  CREATININE 1.03 1.14* 1.08 1.27*  CALCIUM 8.9 8.9 9.1 9.6  MG -- -- -- --  PHOS -- -- -- --   Liver Function Tests:  Lab 10/13/11 2138 10/08/11 1856  AST 31 42*  ALT 19 29  ALKPHOS 60 129*  BILITOT 0.5  1.1  PROT 6.1 7.5  ALBUMIN 2.4* 2.7*   No results found for this basename: LIPASE:5,AMYLASE:5 in the last 168 hours No results found for this basename: AMMONIA:5 in the last 168 hours CBC:  Lab 10/15/11 0301 10/14/11 0422 10/13/11 2138 10/13/11 0706 10/12/11 0600 10/08/11 1856  WBC 13.1* 11.9* 12.9* 12.4* 11.5* --  NEUTROABS -- -- -- -- -- 7.0  HGB 8.4* 8.8* 9.4* 9.1* 9.0* --  HCT 26.3* 28.3* 29.4* 28.6* 28.4* --  MCV 81.2 80.6 80.5 81.3 81.6 --  PLT 188 151 136* 106* 85* --   Cardiac Enzymes: No results found for this basename: CKTOTAL:5,CKMB:5,CKMBINDEX:5,TROPONINI:5 in the last 168 hours BNP (last 3 results) No results found for this basename: PROBNP:3 in the last 8760 hours CBG:  Lab 10/15/11 0818 10/15/11 0400 10/15/11 0116 10/14/11 2117 10/14/11 1730  GLUCAP 100* 107* 119* 138* 100*    Recent Results (from the past 240 hour(s))  CULTURE, BLOOD (ROUTINE X 2)     Status: Normal   Collection Time   10/08/11  6:56 PM      Component Value Range Status Comment   Specimen Description BLOOD LEFT ANTECUBITAL   Final    Special Requests BOTTLES DRAWN AEROBIC AND ANAEROBIC 6CC   Final    Culture NO GROWTH 6 DAYS   Final    Report Status 10/14/2011 FINAL   Final   CULTURE, BLOOD (ROUTINE X 2)     Status: Normal   Collection Time   10/08/11  7:15 PM      Component Value Range Status Comment   Specimen Description BLOOD LEFT HAND   Final    Special Requests BOTTLES DRAWN AEROBIC AND ANAEROBIC 6CC   Final    Culture NO GROWTH 6 DAYS   Final    Report Status 10/14/2011 FINAL   Final   URINE CULTURE     Status: Normal   Collection Time   10/08/11  8:04 PM      Component Value Range Status Comment   Specimen Description URINE, CATHETERIZED   Final    Special Requests NONE   Final    Culture  Setup Time 10/09/2011 21:43   Final    Colony Count NO GROWTH   Final    Culture NO GROWTH   Final    Report Status 10/11/2011 FINAL   Final   MRSA PCR SCREENING     Status: Normal    Collection Time   10/09/11  1:55 AM      Component Value Range Status Comment   MRSA by PCR NEGATIVE  NEGATIVE Final      Studies: Ct Angio Chest Pe W/cm &/or Wo Cm  10/14/2011  *RADIOLOGY REPORT*  Clinical Data: Evaluate for possible pulmonary embolism.  Suspected filling defect in the pulmonary artery noted on recent CT of the abdomen and pelvis.  CT ANGIOGRAPHY CHEST  Technique:  Multidetector CT  imaging of the chest using the standard protocol during bolus administration of intravenous contrast. Multiplanar reconstructed images including MIPs were obtained and reviewed to evaluate the vascular anatomy.  Contrast:  80 ml of Omnipaque 350.  Comparison: CT of the abdomen and pelvis 10/13/2011.  Chest CT 10/08/2011.  Findings:  Mediastinum: There are numerous filling defects throughout the pulmonary arterial tree bilaterally.  The largest defect is in the distal right main pulmonary artery, however, defects are present within the lobar, segmental and subsegmental sized branches bilaterally.  Many of these are eccentrically located and intimately associated with the wall of the vessels, suggesting chronicity, while other defects are more centrally located. Defects do not appear to be nonocclusive at this time.  Heart size is enlarged.  The pulmonic trunk does not appear to be dilated at this time, suggesting against pulmonary arterial hypertension. However, there is some right ventricular dilatation. There is no significant pericardial fluid, thickening or pericardial calcification. No pathologically enlarged mediastinal or hilar lymph nodes. Esophagus is unremarkable in appearance.  Lungs/Pleura: A small amount of dependent atelectasis is noted in the lower lobes of the lungs bilaterally.  No acute consolidative airspace disease.  No pleural effusions.  No definite suspicious appearing pulmonary nodules or masses are identified.  Upper Abdomen: Unremarkable.  Musculoskeletal: There are no aggressive  appearing lytic or blastic lesions noted in the visualized portions of the skeleton.  IMPRESSION: 1.  Study is positive for a large volume of pulmonary emboli throughout the lungs bilaterally, as detailed above.  Notably, there appears to be a combination of acute and chronic emboli present. 2.  Cardiomegaly with some mild right ventricular dilatation, but no dilatation of the pulmonic trunk at this time. Given the dilatation of the right ventricle, correlation for elevated right- sided heart pressures may be warranted.  These results were called by telephone on 10/14/2011 at 05:00 p.m. to Dr. Allena Katz, who verbally acknowledged these results.   Original Report Authenticated By: Florencia Reasons, M.D.    Ct Abdomen Pelvis W Contrast  10/13/2011  *RADIOLOGY REPORT*  Clinical Data: Persistent leukocytosis, right lower abdominal pain, hypertension, diabetes  CT ABDOMEN AND PELVIS WITH CONTRAST  Technique:  Multidetector CT imaging of the abdomen and pelvis was performed following the standard protocol during bolus administration of intravenous contrast. Sagittal and coronal MPR images reconstructed from axial data set.  Contrast: 80mL OMNIPAQUE IOHEXOL 300 MG/ML  SOLN Dilute oral contrast.  Comparison: None  Findings: Right basilar atelectasis and tiny pleural effusion. Questionable filling defect in left lower lobe pulmonary artery image #1, cannot exclude pulmonary embolism. VP shunt tubing and abdominal cavity. Liver, spleen, pancreas, left kidney, and adrenal glands normal appearance. Minimally enlarged right kidney with impaired nephrogram, delayed contrast excretion, and ureteral dilatation suggesting distal obstruction. Periureteral edema is seen to urinary bladder. No obstructing ureteral calcification identified. Free intraperitoneal fluid is present though the patient has a VP shunt.  Nonspecific presacral edema extending to perirectal fat. Left inguinal hernia containing fat. Question slightly increased  density within gallbladder focally cannot exclude gallstone. Stomach decompressed. Large and small bowel loops grossly unremarkable. A thickened tubular structure seen extending into the cecum into the right pelvis, cannot exclude acute appendicitis. The patient had a prior hysterectomy and suspect left ovarian excision. Additional soft tissue in the right pelvis which could represent an ovary or be related to an inflammatory process. Surgical status of the appendix and right ovary is uncertain. Air in urinary bladder. Remaining bowel loops. No adenopathy, mass,  or free intraperitoneal air. Single tiny focus of gas identified in inferior right pelvis, uncertain etiology, image 76. No acute osseous findings.  IMPRESSION: Inflammatory process in the right pelvis where amorphous soft tissue and diffuse tissue infiltration is identified, though uncertain if the by portion of this represents the right ovary. A thickened tubular structure is extending inferiorly from the cecum, image 68, potentially enlarged appendix. Surgical status of the appendix and right ovary is uncertain. Acute appendicitis is not excluded. Differential diagnosis however includes other inflammatory processes of pelvis. Free pelvic fluid could be related to the above inflammatory process or potentially VP shunt. Nonspecific presacral edema. Likely secondary right ureteral obstruction with impaired right renal function.  Questionable filling defect in left lower lobe pulmonary artery, cannot exclude pulmonary embolism; recommend dedicated CTA imaging of the chest or radionuclide ventilation perfusion lung scan for further assessment.  Cannot exclude cholelithiasis. Left inguinal hernia containing fat.  Findings called to Dr. Cena Benton on 10/13/2011 at 1630 hours.   Original Report Authenticated By: Lollie Marrow, M.D.       . antiseptic oral rinse  15 mL Mouth Rinse q12n4p  . chlorhexidine  15 mL Mouth/Throat BID  . feeding supplement  1 Container  Oral TID BM  . heparin  2,500 Units Intravenous Once  . labetalol  200 mg Oral TID  . levETIRAcetam  500 mg Oral BID  . lisinopril  10 mg Oral Daily  . multivitamin with minerals  1 tablet Oral Daily  . pantoprazole  40 mg Oral QHS  . sodium chloride  1,000 mL Intravenous Once  . sodium chloride  500 mL Intravenous Once  . sodium chloride  3 mL Intravenous Q12H  . DISCONTD: cefTRIAXone (ROCEPHIN)  IV  1 g Intravenous Q24H  . DISCONTD: insulin aspart  0-9 Units Subcutaneous Q4H  . DISCONTD: metronidazole  500 mg Intravenous Q8H   acetaminophen, bisacodyl, cloNIDine, hydrALAZINE, HYDROmorphone (DILAUDID) injection, iohexol, ondansetron (ZOFRAN) IV    . dextrose 5 % and 0.45 % NaCl with KCl 20 mEq/L 50 mL/hr at 10/14/11 2105  . heparin 1,400 Units/hr (10/15/11 1112)     Principal Problem:  *Fever Active Problems:  Pulmonary embolism  HTN (hypertension)  SAH (subarachnoid hemorrhage) 08/16/11  Seizure disorder, seizure 09/21/11 while in rehab  VP (ventriculoperitoneal) shunt status  Diabetes mellitus, type 2  UTI (lower urinary tract infection)  Decreased oral intake  Leukocytosis    Time spent: 30 min   Sherman Lipuma V.  [facility] Pager [phone]. If 8PM-8AM, please contact night-coverage at [email or web address], password [facility] 10/15/2011, 8:52 AM  LOS: 7 days

## 2011-10-15 NOTE — Progress Notes (Signed)
ANTICOAGULATION CONSULT NOTE - Follow Up Consult  Pharmacy Consult for Heparin Indication: pulmonary embolus and DVT  Patient Measurements:  Height: 5\' 5"  (165.1 cm)  Weight: 222 lb 3.6 oz (100.8 kg)  IBW/kg (Calculated) : 57  Heparin Dosing Weight: 80  Estimated Creatinine Clearance: 65.8 ml/min (by C-G formula based on Cr of 1.03).   Labs:  Basename 10/15/11 1139 10/15/11 0301 10/14/11 1354 10/14/11 0422 10/13/11 2138  HGB -- 8.4* -- 8.8* --  HCT -- 26.3* -- 28.3* 29.4*  PLT -- 188 -- 151 136*  APTT -- -- -- -- 35  LABPROT -- -- -- -- 15.2  INR -- -- -- -- 1.22  HEPARINUNFRC 0.44 0.35 -- -- --  CREATININE -- -- 1.03 -- 1.14*  CKTOTAL -- -- -- -- --  CKMB -- -- -- -- --  TROPONINI -- -- -- -- --   Assessment/Plan:  61yo female on IV Heparin for PE/DVT found by CT and notable for a combination of acute and chronic emboli present.  She was recently hospitalized due to [facility] that required VP shunt and coiling of aneurysm.  She was discharged to a SNF in [place] where she began having seizures.  She spiked a temperature and was taken to [facility] ED and subsequently transferred here.  Today her heparin level is 0.44 on IV rate of 1400 units/hr.  She is without noted bleeding complications.  Her CBC has trended down some and today is 8.4/26.3.  Her platelets remain stable.  Her WBC is trending higher (currently receiving Rocephin for ABX. Coverage).  Goal: Heparin level 0.3-0.7 Monitor platelets by anticoagulation protocol: Yes   Plan:   Continue IV Heparin at 1400 units/hr  Monitor daily heparin level and CBC  F/U for s/s of bleeding  F/U for plans for Warfarin therapy  Nadara Mustard, PharmD., MS Clinical Pharmacist Pager:  [phone] Thank you for allowing pharmacy to be part of this patients care team. 10/15/2011,1:35 PM

## 2011-10-16 ENCOUNTER — Inpatient Hospital Stay (HOSPITAL_COMMUNITY): Payer: Medicaid Other

## 2011-10-16 DIAGNOSIS — Z982 Presence of cerebrospinal fluid drainage device: Secondary | ICD-10-CM

## 2011-10-16 LAB — CBC
HCT: 30.3 % — ABNORMAL LOW (ref 36.0–46.0)
Hemoglobin: 9.8 g/dL — ABNORMAL LOW (ref 12.0–15.0)
MCHC: 32.3 g/dL (ref 30.0–36.0)
MCV: 80.4 fL (ref 78.0–100.0)

## 2011-10-16 LAB — HEPARIN LEVEL (UNFRACTIONATED)
Heparin Unfractionated: 0.18 IU/mL — ABNORMAL LOW (ref 0.30–0.70)
Heparin Unfractionated: 0.35 IU/mL (ref 0.30–0.70)

## 2011-10-16 MED ORDER — CLONIDINE HCL 0.2 MG PO TABS
0.2000 mg | ORAL_TABLET | ORAL | Status: DC | PRN
  Filled 2011-10-16: qty 1

## 2011-10-16 NOTE — Progress Notes (Signed)
Nursing Note: Patient had 1 episode of vomiting last night approximately 45 minutes after she took her medication, no visible pills noted in emesis. Patient's SBP has been 160-200's overnight. Hydralazine given x2 prn. Patient refusing PO meds for BP control at this time - states epigastric pain and stomach ache at times. Will continue to monitor patient closely. No complaints at this time.

## 2011-10-16 NOTE — Progress Notes (Signed)
ANTICOAGULATION CONSULT NOTE - Follow Up Consult  Pharmacy Consult for Heparin Indication: pulmonary embolus and DVT  Patient Measurements:  Height: 5\' 5"  (165.1 cm)  Weight: 222 lb 3.6 oz (100.8 kg)  IBW/kg (Calculated) : 57  Heparin Dosing Weight: 80  Estimated Creatinine Clearance: 65.8 ml/min (by C-G formula based on Cr of 1.03).   Labs:  Basename 10/16/11 1120 10/16/11 1012 10/15/11 1139 10/15/11 0301 10/14/11 1354 10/14/11 0422 10/13/11 2138  HGB 9.8* -- -- 8.4* -- -- --  HCT 30.3* -- -- 26.3* -- 28.3* --  PLT 241 -- -- 188 -- 151 --  APTT -- -- -- -- -- -- 35  LABPROT -- -- -- -- -- -- 15.2  INR -- -- -- -- -- -- 1.22  HEPARINUNFRC -- 0.18* 0.44 0.35 -- -- --  CREATININE -- -- -- -- 1.03 -- 1.14*  CKTOTAL -- -- -- -- -- -- --  CKMB -- -- -- -- -- -- --  TROPONINI -- -- -- -- -- -- --   Assessment/Plan:  61yo female on IV Heparin for PE/DVT found by CT and notable for a combination of acute and chronic emboli present.  She was recently hospitalized due to [facility] that required VP shunt and coiling of aneurysm.  She was discharged to a SNF in [place] where she began having seizures.  She spiked a temperature and was taken to [facility] ED and subsequently transferred here.  Today her heparin level is 0.18 on IV rate of 1400 units/hr.  There was some lab issues overnight, not sure this level is truly reflective of her response.   She is without noted bleeding complications.  Her CBC is up today and no noted bleeding.  Her platelets remain stable.  Her WBC is trending higher (currently receiving Rocephin for ABX. Coverage).  Goal: Heparin level 0.3-0.7 Monitor platelets by anticoagulation protocol: Yes   Plan:   Will increase IV Heparin rate to 1550 units/hr  Monitor daily heparin level and CBC  F/U for s/s of bleeding  F/U for plans for Warfarin therapy  Nadara Mustard, PharmD., MS Clinical Pharmacist Pager:  [phone] Thank you for allowing pharmacy to be part of  this patients care team. 10/16/2011,1:40 PM

## 2011-10-16 NOTE — Progress Notes (Signed)
Pulmonary/Critical Care Medicine  Reason for Consult: Acute on chronic bilateral extensive PE Referring Physician: Dr. Joeseph Amor  [name] is an 61 y.o. female.   Lines/tubes: Peripheral IV   Microbiology/Sepsis markers: Results for orders placed during the hospital encounter of 10/08/11  CULTURE, BLOOD (ROUTINE X 2)     Status: Normal   Collection Time   10/08/11  6:56 PM      Component Value Range Status Comment   Specimen Description BLOOD LEFT ANTECUBITAL   Final    Special Requests BOTTLES DRAWN AEROBIC AND ANAEROBIC 6CC   Final    Culture NO GROWTH 6 DAYS   Final    Report Status 10/14/2011 FINAL   Final   CULTURE, BLOOD (ROUTINE X 2)     Status: Normal   Collection Time   10/08/11  7:15 PM      Component Value Range Status Comment   Specimen Description BLOOD LEFT HAND   Final    Special Requests BOTTLES DRAWN AEROBIC AND ANAEROBIC 6CC   Final    Culture NO GROWTH 6 DAYS   Final    Report Status 10/14/2011 FINAL   Final   URINE CULTURE     Status: Normal   Collection Time   10/08/11  8:04 PM      Component Value Range Status Comment   Specimen Description URINE, CATHETERIZED   Final    Special Requests NONE   Final    Culture  Setup Time 10/09/2011 21:43   Final    Colony Count NO GROWTH   Final    Culture NO GROWTH   Final    Report Status 10/11/2011 FINAL   Final   MRSA PCR SCREENING     Status: Normal   Collection Time   10/09/11  1:55 AM      Component Value Range Status Comment   MRSA by PCR NEGATIVE  NEGATIVE Final     Anti-infectives:  Anti-infectives     Start     Dose/Rate Route Frequency Ordered Stop   10/13/11 1800   metroNIDAZOLE (FLAGYL) IVPB 500 mg  Status:  Discontinued        500 mg 100 mL/hr over 60 Minutes Intravenous Every 8 hours 10/13/11 1649 10/15/11 1341   10/13/11 1100   cefTRIAXone (ROCEPHIN) 1 g in dextrose 5 % 50 mL IVPB  Status:  Discontinued        1 g 100 mL/hr over 30 Minutes Intravenous Every 24 hours 10/13/11 0934  10/15/11 1341   10/11/11 1000   levofloxacin (LEVAQUIN) tablet 500 mg  Status:  Discontinued        500 mg Oral Daily 10/11/11 0910 10/13/11 1643   10/09/11 1000   cefTRIAXone (ROCEPHIN) 2 g in dextrose 5 % 50 mL IVPB  Status:  Discontinued        2 g 100 mL/hr over 30 Minutes Intravenous 2 times daily 10/08/11 2251 10/11/11 0909   10/09/11 0600   vancomycin (VANCOCIN) IVPB 1000 mg/200 mL premix  Status:  Discontinued        1,000 mg 200 mL/hr over 60 Minutes Intravenous Every 12 hours 10/09/11 0138 10/11/11 0909   10/09/11 0200   levofloxacin (LEVAQUIN) IVPB 500 mg  Status:  Discontinued        500 mg 100 mL/hr over 60 Minutes Intravenous Daily at bedtime 10/09/11 0130 10/11/11 0910   10/08/11 2350   dextrose 5 % with cefTRIAXone (ROCEPHIN) ADS Med     Comments:  WATKINS, PENNY: cabinet override         10/08/11 2350 10/09/11 0020   10/08/11 2330   cefTRIAXone (ROCEPHIN) 2 g in dextrose 5 % 50 mL IVPB        2 g 100 mL/hr over 30 Minutes Intravenous  Once 10/08/11 2304 10/09/11 0019   10/08/11 2300   levofloxacin (LEVAQUIN) IVPB 500 mg  Status:  Discontinued        500 mg 100 mL/hr over 60 Minutes Intravenous Every 24 hours 10/08/11 2251 10/09/11 0130   10/08/11 1945   vancomycin (VANCOCIN) IVPB 1000 mg/200 mL premix        1,000 mg 200 mL/hr over 60 Minutes Intravenous  Once 10/08/11 1939 10/08/11 2122   10/08/11 1945   piperacillin-tazobactam (ZOSYN) IVPB 3.375 g        3.375 g 12.5 mL/hr over 240 Minutes Intravenous  Once 10/08/11 1939 10/08/11 2020   10/08/11 1945   clindamycin (CLEOCIN) 900 mg in dextrose 5 % 100 mL IVPB        900 mg 200 mL/hr over 30 Minutes Intravenous  Once 10/08/11 1939 10/08/11 2135         Best Practice/Protocols:  VTE Prophylaxis: Mechanical GI Prophylaxis: Proton Pump Inhibitor  Consults: Treatment Team:  Maeola Harman, MD   Studies: Ct Head Wo Contrast  10/08/2011  *RADIOLOGY REPORT*  Clinical Data: Status post embolization of  anterior communicating artery aneurysm after subarachnoid hemorrhage.  CT HEAD WITHOUT CONTRAST  Technique:  Contiguous axial images were obtained from the base of the skull through the vertex without contrast.  Comparison: 09/21/2011  Findings: Stable coarse and positioning of ventriculostomy catheter.  No evidence of hydrocephalus or intraventricular blood. Evolving encephalomalacia in the left ACA territory and inferior right frontal lobe without progressive infarction since the prior study.  No evidence of acute hemorrhage, extra-axial fluid collections or mass effect.  The skull is unremarkable.  Aneurysm coils again identified.  IMPRESSION: No acute findings.  Evolving encephalomalacia in the ACA territory of the brain.   Original Report Authenticated By: Reola Calkins, M.D.    Ct Head Wo Contrast  09/21/2011  *RADIOLOGY REPORT*  Clinical Data: Seizure  CT HEAD WITHOUT CONTRAST  Technique:  Contiguous axial images were obtained from the base of the skull through the vertex without contrast.  Comparison: 09/12/2011  Findings: There is a right frontal ventriculostomy catheter with tip terminating in the region of the third ventricle.  The ventricular volumes are within normal limits.  Subacute infarct involving bilateral frontal lobes, left corpus callosum and left basal ganglia are identified.  The appearance is unchanged from previous examination.  No evidence for acute infarct or intracranial hemorrhage. Aneurysm coil mass is unchanged. The mastoid air cells and the paranasal sinuses are clear.  The skull appears intact.  IMPRESSION:  1.  Stable exam.  No evidence for worsening infarction, new hemorrhage or hydrocephalus.   Original Report Authenticated By: Rosealee Albee, M.D.    Ct Chest Wo Contrast  10/08/2011  *RADIOLOGY REPORT*  Clinical Data: Fever.  CT CHEST WITHOUT CONTRAST  Technique:  Multidetector CT imaging of the chest was performed following the standard protocol without IV contrast.   Comparison: Chest x-ray earlier today.  Findings: There is cardiomegaly.  Bibasilar atelectasis present. No pleural effusions.  Aorta is tortuous, non-aneurysmal. No mediastinal, hilar, or axillary adenopathy.  Visualized thyroid and chest wall soft tissues unremarkable. Imaging into the upper abdomen shows no acute findings.   No acute  bony abnormality.  IMPRESSION: Cardiomegaly, bibasilar atelectasis.   Original Report Authenticated By: Cyndie Chime, M.D.    Ct Angio Chest Pe W/cm &/or Wo Cm  10/14/2011  *RADIOLOGY REPORT*  Clinical Data: Evaluate for possible pulmonary embolism.  Suspected filling defect in the pulmonary artery noted on recent CT of the abdomen and pelvis.  CT ANGIOGRAPHY CHEST  Technique:  Multidetector CT imaging of the chest using the standard protocol during bolus administration of intravenous contrast. Multiplanar reconstructed images including MIPs were obtained and reviewed to evaluate the vascular anatomy.  Contrast:  80 ml of Omnipaque 350.  Comparison: CT of the abdomen and pelvis 10/13/2011.  Chest CT 10/08/2011.  Findings:  Mediastinum: There are numerous filling defects throughout the pulmonary arterial tree bilaterally.  The largest defect is in the distal right main pulmonary artery, however, defects are present within the lobar, segmental and subsegmental sized branches bilaterally.  Many of these are eccentrically located and intimately associated with the wall of the vessels, suggesting chronicity, while other defects are more centrally located. Defects do not appear to be nonocclusive at this time.  Heart size is enlarged.  The pulmonic trunk does not appear to be dilated at this time, suggesting against pulmonary arterial hypertension. However, there is some right ventricular dilatation. There is no significant pericardial fluid, thickening or pericardial calcification. No pathologically enlarged mediastinal or hilar lymph nodes. Esophagus is unremarkable in appearance.   Lungs/Pleura: A small amount of dependent atelectasis is noted in the lower lobes of the lungs bilaterally.  No acute consolidative airspace disease.  No pleural effusions.  No definite suspicious appearing pulmonary nodules or masses are identified.  Upper Abdomen: Unremarkable.  Musculoskeletal: There are no aggressive appearing lytic or blastic lesions noted in the visualized portions of the skeleton.  IMPRESSION: 1.  Study is positive for a large volume of pulmonary emboli throughout the lungs bilaterally, as detailed above.  Notably, there appears to be a combination of acute and chronic emboli present. 2.  Cardiomegaly with some mild right ventricular dilatation, but no dilatation of the pulmonic trunk at this time. Given the dilatation of the right ventricle, correlation for elevated right- sided heart pressures may be warranted.  These results were called by telephone on 10/14/2011 at 05:00 p.m. to Dr. Allena Katz, who verbally acknowledged these results.   Original Report Authenticated By: Florencia Reasons, M.D.    Ct Abdomen Pelvis W Contrast  10/13/2011  *RADIOLOGY REPORT*  Clinical Data: Persistent leukocytosis, right lower abdominal pain, hypertension, diabetes  CT ABDOMEN AND PELVIS WITH CONTRAST  Technique:  Multidetector CT imaging of the abdomen and pelvis was performed following the standard protocol during bolus administration of intravenous contrast. Sagittal and coronal MPR images reconstructed from axial data set.  Contrast: 80mL OMNIPAQUE IOHEXOL 300 MG/ML  SOLN Dilute oral contrast.  Comparison: None  Findings: Right basilar atelectasis and tiny pleural effusion. Questionable filling defect in left lower lobe pulmonary artery image #1, cannot exclude pulmonary embolism. VP shunt tubing and abdominal cavity. Liver, spleen, pancreas, left kidney, and adrenal glands normal appearance. Minimally enlarged right kidney with impaired nephrogram, delayed contrast excretion, and ureteral dilatation  suggesting distal obstruction. Periureteral edema is seen to urinary bladder. No obstructing ureteral calcification identified. Free intraperitoneal fluid is present though the patient has a VP shunt.  Nonspecific presacral edema extending to perirectal fat. Left inguinal hernia containing fat. Question slightly increased density within gallbladder focally cannot exclude gallstone. Stomach decompressed. Large and small bowel loops grossly unremarkable.  A thickened tubular structure seen extending into the cecum into the right pelvis, cannot exclude acute appendicitis. The patient had a prior hysterectomy and suspect left ovarian excision. Additional soft tissue in the right pelvis which could represent an ovary or be related to an inflammatory process. Surgical status of the appendix and right ovary is uncertain. Air in urinary bladder. Remaining bowel loops. No adenopathy, mass, or free intraperitoneal air. Single tiny focus of gas identified in inferior right pelvis, uncertain etiology, image 76. No acute osseous findings.  IMPRESSION: Inflammatory process in the right pelvis where amorphous soft tissue and diffuse tissue infiltration is identified, though uncertain if the by portion of this represents the right ovary. A thickened tubular structure is extending inferiorly from the cecum, image 68, potentially enlarged appendix. Surgical status of the appendix and right ovary is uncertain. Acute appendicitis is not excluded. Differential diagnosis however includes other inflammatory processes of pelvis. Free pelvic fluid could be related to the above inflammatory process or potentially VP shunt. Nonspecific presacral edema. Likely secondary right ureteral obstruction with impaired right renal function.  Questionable filling defect in left lower lobe pulmonary artery, cannot exclude pulmonary embolism; recommend dedicated CTA imaging of the chest or radionuclide ventilation perfusion lung scan for further  assessment.  Cannot exclude cholelithiasis. Left inguinal hernia containing fat.  Findings called to Dr. Cena Benton on 10/13/2011 at 1630 hours.   Original Report Authenticated By: Lollie Marrow, M.D.    Nm Myocar Multi W/spect W/wall Motion / Ef  09/26/2011  *RADIOLOGY REPORT*  Clinical Data:  61 year old female with coronary artery disease. The the a the  MYOCARDIAL IMAGING WITH SPECT (REST AND PHARMACOLOGIC-STRESS) GATED LEFT VENTRICULAR WALL MOTION STUDY LEFT VENTRICULAR EJECTION FRACTION  Technique:  Resting myocardial SPECT imaging was initially performed after intravenous administration of radiopharmaceutical. Myocardial SPECT was subsequently performed after additional radiopharmaceutical injection during pharmacologic-stress supervised by the Cardiology staff.  Quantitative gated imaging was also performed to evaluate left ventricular wall motion, and estimate left ventricular ejection fraction.  Radiopharmaceutical:  Tc-50m Myoview at rest and during stress.  Comparison: none  Findings:  Technique: Study is adequate.  Perfusion:  There are no relative decreased counts on stress or rest to suggest reversible ischemia or infarction.  Wall motion:   Hypokinesia of the lateral wall.  Left ventricular ejection fraction:  Calculated left ventricular ejection fraction =  15%  IMPRESSION:  1. No reversible ischemia or infarction. 2.  Lateral wall hypokinesia.  3.  Left ventricular ejection fraction equal 15%   Original Report Authenticated By: Genevive Bi, M.D.    Dg Chest Portable 1 View  10/08/2011  *RADIOLOGY REPORT*  Clinical Data: Seizure.  Fever.  PORTABLE CHEST - 1 VIEW  Comparison: 09/03/2011  Findings: Artifact overlies chest.  Shunt tube overlies the chest. There is a poor inspiration.  Basilar atelectasis or infiltrate is not excluded.  Upper lungs are clear.  No bony abnormalities seen.  IMPRESSION: Poor inspiration.  Cannot rule out atelectasis or infiltrate at the lung bases.   Otherwise negative.   Original Report Authenticated By: Thomasenia Sales, M.D.    Dg Swallowing Func-speech Pathology  09/21/2011  FLUOROSCOPY FOR SWALLOWING FUNCTION STUDY  Fluoroscopy was provided for swallowing function study, which was a dministered by a speech pathologist.  Final results and recommendat ions from this study are contained within the speech pathology repo rt.   Original Report Authenticated By: Sharyn Lull    Events:  HPI: Patient is a 61 yo F  with recent PMH of HTN, SAH with hydrocephalus requiring VP shunt in August 2013, ACA aneurysm requiring coiling (also in August 2013) who was readmitted on 10/08/11 for increasing AMS and fever at [facility]. Throughout her hospitalization, she has been evaluated for source of fever, which included an abdominal CT scan. On that exam, it was noted that she may have PE therefore CTA was performed. That exam revealed bilateral acute on chronic non-obstructing pulmonary embolism. She had dopplers of her lower extremities that reveal DVT of CFV, PFV and proximal FV. Patient states she has never had a blood clot before. Pulmonology was consulted for further recommendations of treatment of PE.  Past Medical History  Diagnosis Date  . Hypertension     Past Surgical History  Procedure Date  . Abdominal hysterectomy   . Ventriculoperitoneal shunt 09/02/2011    Procedure: SHUNT INSERTION VENTRICULAR-PERITONEAL;  Surgeon: Maeola Harman, MD;  Location: [facility];  Service: Neurosurgery;  Laterality: Right;   Allergies:  Allergies  Allergen Reactions  . Lactose Intolerance (Gi) Other (See Comments)    G.I. Upset   Medications: I have reviewed the patient's current medications.  LABS Results for orders placed during the hospital encounter of 10/08/11 (from the past 24 hour(s))  HEPARIN LEVEL (UNFRACTIONATED)     Status: Normal   Collection Time   10/15/11 11:39 AM      Component Value Range   Heparin Unfractionated 0.44  0.30 - 0.70 IU/mL  GLUCOSE,  CAPILLARY     Status: Abnormal   Collection Time   10/15/11  1:10 PM      Component Value Range   Glucose-Capillary 132 (*) 70 - 99 mg/dL  GLUCOSE, CAPILLARY     Status: Abnormal   Collection Time   10/16/11  8:26 AM      Component Value Range   Glucose-Capillary 156 (*) 70 - 99 mg/dL    Blood pressure 952/84, pulse 81, temperature 98.4 F (36.9 C), temperature source Axillary, resp. rate 21, height 5\' 5"  (1.651 m), weight 97.3 kg (214 lb 8.1 oz), SpO2 98.00%.  Physical Examination: General appearance - alert, well appearing, and in no distress and does not seem interested in talking Mental status - alert, oriented to person, place, and time HEENT - AT, [place]. MMM Neck - supple, no significant adenopathy Chest - clear to auscultation, no wheezes, rales or rhonchi, symmetric air entry, Good effort with no difficulty. On room air. Heart - normal rate, regular rhythm, normal S1, S2, no murmurs, rubs, clicks or gallops Abdomen - soft, nontender, nondistended, no masses or organomegaly Neurological - alert, oriented, normal speech, no focal findings or movement disorder noted Musculoskeletal - no joint tenderness, deformity or swelling, no muscular tenderness noted Extremities - no pedal edema noted, no TTP of lower extremities/calves Skin - normal coloration and turgor, no rashes, no suspicious skin lesions noted   Assessment and Plan   NEURO  Altered Mental Status:  change in mental status at admission   Plan:  - Known seizure d/o. Con't Keppra per Neuro  PULM  Pulmonary Embolism (radiographically demonstrated, chest CTA)   Plan: Bilateral extensive non-occlusive PE. No known history. Was recently d/c from hospital, but SCD documented throughout hospital stay. No vital sign instability; On RA, no tachycardia - Given known SAH and aneurysm, neuro consulted as well. Patient is more than 2 months out from surgery and should be safe for anticoagulation.  - Patient has history of gait  instability and endorses recent falls - Continue  heparin gtt. If patient tolerates well, we will transition to Lovenox  CARDIO  History of HTN. No tachycardia a this time   Plan:  - Monitor on tele. Vitals per floor protocol - Continue medications per primary team  RENAL  No acute issues   Plan:  - Creatinine stable - Replete K+ per primary team  GI  Decreased PO intake. R/O appendicitis   Plan:  - Dietary consult - Surgery signed off; fever not likely due to appendicitis   ID  Fevers, leukocytosis   Plan:  - Source unknown - Panculture - Continue IV abx - Monitor labs  HEME  No active issues at this time   Plan:  - Plan to check coags based on anticoagulation  ENDO Diabetes Mellitus (Type 2)   Plan:  - Con't SSI - Carb modified diet  Global Issues   Patient with bilateral acute on chronic PE as well as DVT evidenced on doppler. Although she has had recent [facility] and is a fall risk, will start Heparin for anticoagulation given the risks vs benefits.     Lonzo Cloud. Kriste Basque, MD 10/16/2011, 9:44 AM

## 2011-10-16 NOTE — Progress Notes (Signed)
ANTICOAGULATION CONSULT NOTE - Follow Up Consult  Pharmacy Consult for Heparin Indication: pulmonary embolus and DVT  Patient Measurements:  Height: 5\' 5"  (165.1 cm)  Weight: 222 lb 3.6 oz (100.8 kg)  IBW/kg (Calculated) : 57  Heparin Dosing Weight: 80  Estimated Creatinine Clearance: 65.8 ml/min (by C-G formula based on Cr of 1.03).   Labs:  Basename 10/16/11 1938 10/16/11 1120 10/16/11 1012 10/15/11 1139 10/15/11 0301 10/14/11 1354 10/14/11 0422  HGB -- 9.8* -- -- 8.4* -- --  HCT -- 30.3* -- -- 26.3* -- 28.3*  PLT -- 241 -- -- 188 -- 151  APTT -- -- -- -- -- -- --  LABPROT -- -- -- -- -- -- --  INR -- -- -- -- -- -- --  HEPARINUNFRC 0.35 -- 0.18* 0.44 -- -- --  CREATININE -- -- -- -- -- 1.03 --  CKTOTAL -- -- -- -- -- -- --  CKMB -- -- -- -- -- -- --  TROPONINI -- -- -- -- -- -- --   Assessment/Plan:  61yo female on IV Heparin for PE/DVT found by CT and notable for a combination of acute and chronic emboli present.  She was recently hospitalized due to [facility] that required VP shunt and coiling of aneurysm.  She was discharged to a SNF in [place] where she began having seizures.  She spiked a temperature and was taken to [facility] ED and subsequently transferred here.  Xa level now therapeutic.  Goal: Heparin level 0.3-0.7 Monitor platelets by anticoagulation protocol: Yes   Plan:  Continue gtt at 1550 units/hr and f/u in am.  Verlene Mayer, PharmD, BCPS Pager [phone] 10/16/2011,10:08 PM

## 2011-10-16 NOTE — Progress Notes (Signed)
[facility] PROGRESS NOTE  April RUBIANO [name]:[id number] DOB: 06/21/1950 DOA: 10/08/2011 PCP: No primary provider on file.  Assessment/Plan: 1. Acute and chronic bilateral extensive PE : Pt is clinically stable. Continue heparin and monitor carefully due to h/o aneurysm, shunt and coiling, USBLE showed VT in LE also. Consider coumadin (reversible if needed in future) for outpatient use. Pharmacy to manage anticoagulation   2. Hypokalemia : replacing, repeat BMP    3. H/o subarachnoid hemorrhage with hydrocephalus: Dr. Phoebe Perch (neurosurgery) on board and will follow up with his recommendations. CT of head on 10/08/11 reported no acute findings. Did report however evolving encephalomalacia in the ACA territory. 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 Corliss Skains on 08/19/11. Per Neurosurgery likely hood of VP shunt infection low.   4. Seizure d/o: Recommendations per neurology. continue Keppra. Should patient have any more seizure like activity will plan on contacting neurology for further recommendations.   5. UTI: U/A on 10/05 showed hazy urine with large hgb and many bacteria. Urine culture with no growth. WBC elevated despite being on Levaquin. Now ceftriaxone and MTNZ stopped today as pt is stable and no further infection identified. Vancomycin discontinued on 10/08. Will recheck cbc in am. Patient is afebrile within the last 72 hours.   6. DM: Blood sugars relatively well controlled at this point. Diabetic diet and home lantus started. Other insulin orders stopped due to good levels.   7. Hypertension, uncontrolled: We increased the frequency of clonidine to q2 prn due to elevated BP at night. Home blood pressure medication was held initially most likely due to lower blood pressure readings on admission. At this point patient has required hydralazine and labetalol prn. continue home blood pressure medication regimen. Will continue the hydralazine prn elevated blood  pressures. BP is high today.    6. Decreased oral intake: Po intake improved. Will f/u with dieticians recommendations.   7. Leukocytosis: At this point suspected PE, DVT. Monitor the trend. CT scan of the chest did not show any sources of infection. Neurosurg does not suspect neurological source based on their work up. Follow CBC with diff and consider WBC scan.    Code Status: full  Family Communication: relatives present in the room and discussed with them  Disposition Plan: pending w/u    Consultants:  Surgery, uro, pulm   Procedures:  CT angio   Antibiotics:  Ceftriaxone and metro d/c on 10/12  HPI/Subjective: Per family, she had pleuritic CP at night and back pain which woke her up several times. She had elevated BP due to stress also and received treatment for it. She has no SOB or CP at this time and tired due to poor sleep at night.   Objective: Filed Vitals:   10/16/11 0300 10/16/11 0403 10/16/11 0500 10/16/11 0800  BP: 190/100 174/85 167/84 177/77  Pulse: 84 83 82 81  Temp:   98.2 F (36.8 C) 98.4 F (36.9 C)  TempSrc:   Oral Axillary  Resp: 22 24 24 21   Height:      Weight:   97.3 kg (214 lb 8.1 oz)   SpO2: 97% 98% 98% 98%    Intake/Output Summary (Last 24 hours) at 10/16/11 0920 Last data filed at 10/16/11 0800  Gross per 24 hour  Intake   1804 ml  Output   1600 ml  Net    204 ml   Filed Weights   10/14/11 0426 10/15/11 0500 10/16/11 0500  Weight: 100.8 kg (222 lb  3.6 oz) 97 kg (213 lb 13.5 oz) 97.3 kg (214 lb 8.1 oz)    Exam: General: sleepy, responds to commands. flat affect, oriented x 3, NAD, tachypneic Cardiovascular: S1, S2 regular, murmur present  Respiratory: good AE bil, no wheezing  Abdomen: NT, ND, BS +  Skin : rash or bruises.    Data Reviewed: Basic Metabolic Panel:  Lab 10/14/11 1610 10/13/11 2138 10/10/11 0656  NA 141 136 144  K 3.3* 3.5 3.3*  CL 106 104 109  CO2 25 21 23   GLUCOSE 102* 94 111*  BUN 8 12 24*    CREATININE 1.03 1.14* 1.08  CALCIUM 8.9 8.9 9.1  MG -- -- --  PHOS -- -- --   Liver Function Tests:  Lab 10/13/11 2138  AST 31  ALT 19  ALKPHOS 60  BILITOT 0.5  PROT 6.1  ALBUMIN 2.4*   No results found for this basename: LIPASE:5,AMYLASE:5 in the last 168 hours No results found for this basename: AMMONIA:5 in the last 168 hours CBC:  Lab 10/15/11 0301 10/14/11 0422 10/13/11 2138 10/13/11 0706 10/12/11 0600  WBC 13.1* 11.9* 12.9* 12.4* 11.5*  NEUTROABS -- -- -- -- --  HGB 8.4* 8.8* 9.4* 9.1* 9.0*  HCT 26.3* 28.3* 29.4* 28.6* 28.4*  MCV 81.2 80.6 80.5 81.3 81.6  PLT 188 151 136* 106* 85*   Cardiac Enzymes: No results found for this basename: CKTOTAL:5,CKMB:5,CKMBINDEX:5,TROPONINI:5 in the last 168 hours BNP (last 3 results) No results found for this basename: PROBNP:3 in the last 8760 hours CBG:  Lab 10/16/11 0826 10/15/11 1310 10/15/11 0818 10/15/11 0400 10/15/11 0116  GLUCAP 156* 132* 100* 107* 119*    Recent Results (from the past 240 hour(s))  CULTURE, BLOOD (ROUTINE X 2)     Status: Normal   Collection Time   10/08/11  6:56 PM      Component Value Range Status Comment   Specimen Description BLOOD LEFT ANTECUBITAL   Final    Special Requests BOTTLES DRAWN AEROBIC AND ANAEROBIC 6CC   Final    Culture NO GROWTH 6 DAYS   Final    Report Status 10/14/2011 FINAL   Final   CULTURE, BLOOD (ROUTINE X 2)     Status: Normal   Collection Time   10/08/11  7:15 PM      Component Value Range Status Comment   Specimen Description BLOOD LEFT HAND   Final    Special Requests BOTTLES DRAWN AEROBIC AND ANAEROBIC 6CC   Final    Culture NO GROWTH 6 DAYS   Final    Report Status 10/14/2011 FINAL   Final   URINE CULTURE     Status: Normal   Collection Time   10/08/11  8:04 PM      Component Value Range Status Comment   Specimen Description URINE, CATHETERIZED   Final    Special Requests NONE   Final    Culture  Setup Time 10/09/2011 21:43   Final    Colony Count NO GROWTH    Final    Culture NO GROWTH   Final    Report Status 10/11/2011 FINAL   Final   MRSA PCR SCREENING     Status: Normal   Collection Time   10/09/11  1:55 AM      Component Value Range Status Comment   MRSA by PCR NEGATIVE  NEGATIVE Final      Studies: Ct Angio Chest Pe W/cm &/or Wo Cm  10/14/2011  *RADIOLOGY REPORT*  Clinical Data:  Evaluate for possible pulmonary embolism.  Suspected filling defect in the pulmonary artery noted on recent CT of the abdomen and pelvis.  CT ANGIOGRAPHY CHEST  Technique:  Multidetector CT imaging of the chest using the standard protocol during bolus administration of intravenous contrast. Multiplanar reconstructed images including MIPs were obtained and reviewed to evaluate the vascular anatomy.  Contrast:  80 ml of Omnipaque 350.  Comparison: CT of the abdomen and pelvis 10/13/2011.  Chest CT 10/08/2011.  Findings:  Mediastinum: There are numerous filling defects throughout the pulmonary arterial tree bilaterally.  The largest defect is in the distal right main pulmonary artery, however, defects are present within the lobar, segmental and subsegmental sized branches bilaterally.  Many of these are eccentrically located and intimately associated with the wall of the vessels, suggesting chronicity, while other defects are more centrally located. Defects do not appear to be nonocclusive at this time.  Heart size is enlarged.  The pulmonic trunk does not appear to be dilated at this time, suggesting against pulmonary arterial hypertension. However, there is some right ventricular dilatation. There is no significant pericardial fluid, thickening or pericardial calcification. No pathologically enlarged mediastinal or hilar lymph nodes. Esophagus is unremarkable in appearance.  Lungs/Pleura: A small amount of dependent atelectasis is noted in the lower lobes of the lungs bilaterally.  No acute consolidative airspace disease.  No pleural effusions.  No definite suspicious appearing  pulmonary nodules or masses are identified.  Upper Abdomen: Unremarkable.  Musculoskeletal: There are no aggressive appearing lytic or blastic lesions noted in the visualized portions of the skeleton.  IMPRESSION: 1.  Study is positive for a large volume of pulmonary emboli throughout the lungs bilaterally, as detailed above.  Notably, there appears to be a combination of acute and chronic emboli present. 2.  Cardiomegaly with some mild right ventricular dilatation, but no dilatation of the pulmonic trunk at this time. Given the dilatation of the right ventricle, correlation for elevated right- sided heart pressures may be warranted.  These results were called by telephone on 10/14/2011 at 05:00 p.m. to Dr. Allena Katz, who verbally acknowledged these results.   Original Report Authenticated By: Florencia Reasons, M.D.     Scheduled Meds:   . feeding supplement  1 Container Oral TID BM  . insulin glargine  4 Units Subcutaneous Daily  . labetalol  200 mg Oral TID  . levETIRAcetam  500 mg Oral BID  . lisinopril  10 mg Oral Daily  . multivitamin with minerals  1 tablet Oral Daily  . pantoprazole  40 mg Oral QHS  . sodium chloride  1,000 mL Intravenous Once  . sodium chloride  500 mL Intravenous Once  . sodium chloride  3 mL Intravenous Q12H  . DISCONTD: antiseptic oral rinse  15 mL Mouth Rinse q12n4p  . DISCONTD: cefTRIAXone (ROCEPHIN)  IV  1 g Intravenous Q24H  . DISCONTD: chlorhexidine  15 mL Mouth/Throat BID  . DISCONTD: insulin aspart  0-9 Units Subcutaneous Q4H  . DISCONTD: metronidazole  500 mg Intravenous Q8H   Continuous Infusions:   . dextrose 5 % and 0.45 % NaCl with KCl 20 mEq/L 50 mL/hr at 10/15/11 2117  . heparin 1,400 Units/hr (10/16/11 0800)    Principal Problem:  *Fever Active Problems:  Pulmonary embolism  HTN (hypertension)  SAH (subarachnoid hemorrhage) 08/16/11  Seizure disorder, seizure 09/21/11 while in rehab  VP (ventriculoperitoneal) shunt status  Diabetes mellitus,  type 2  UTI (lower urinary tract infection)  Decreased oral intake  Leukocytosis  Time spent: 30 min    Raidyn Breiner V.  [facility] Pager [phone]. If 8PM-8AM, please contact night-coverage at [email or web address], password [facility] 10/16/2011, 9:20 AM  LOS: 8 days

## 2011-10-17 DIAGNOSIS — I2699 Other pulmonary embolism without acute cor pulmonale: Secondary | ICD-10-CM

## 2011-10-17 LAB — CBC WITH DIFFERENTIAL/PLATELET
Eosinophils Absolute: 0.2 10*3/uL (ref 0.0–0.7)
Eosinophils Relative: 1 % (ref 0–5)
HCT: 27.7 % — ABNORMAL LOW (ref 36.0–46.0)
Hemoglobin: 8.7 g/dL — ABNORMAL LOW (ref 12.0–15.0)
Lymphocytes Relative: 14 % (ref 12–46)
Lymphs Abs: 2.4 10*3/uL (ref 0.7–4.0)
MCH: 25.4 pg — ABNORMAL LOW (ref 26.0–34.0)
MCV: 81 fL (ref 78.0–100.0)
Monocytes Absolute: 0.7 10*3/uL (ref 0.1–1.0)
Monocytes Relative: 4 % (ref 3–12)
RBC: 3.42 MIL/uL — ABNORMAL LOW (ref 3.87–5.11)
WBC: 17.1 10*3/uL — ABNORMAL HIGH (ref 4.0–10.5)

## 2011-10-17 LAB — COMPREHENSIVE METABOLIC PANEL
ALT: 10 U/L (ref 0–35)
BUN: 8 mg/dL (ref 6–23)
CO2: 25 mEq/L (ref 19–32)
Calcium: 8.2 mg/dL — ABNORMAL LOW (ref 8.4–10.5)
Creatinine, Ser: 1.33 mg/dL — ABNORMAL HIGH (ref 0.50–1.10)
GFR calc Af Amer: 48 mL/min — ABNORMAL LOW (ref >90)
GFR calc non Af Amer: 42 mL/min — ABNORMAL LOW (ref >90)
Glucose, Bld: 101 mg/dL — ABNORMAL HIGH (ref 70–99)
Sodium: 138 mEq/L (ref 135–145)

## 2011-10-17 LAB — PROTEIN ELECTROPH W RFLX QUANT IMMUNOGLOBULINS
Albumin ELP: 44.9 % — ABNORMAL LOW (ref 55.8–66.1)
Alpha-1-Globulin: 7.9 % — ABNORMAL HIGH (ref 2.9–4.9)
Alpha-2-Globulin: 18 % — ABNORMAL HIGH (ref 7.1–11.8)

## 2011-10-17 LAB — GLUCOSE, CAPILLARY

## 2011-10-17 LAB — HEPARIN LEVEL (UNFRACTIONATED): Heparin Unfractionated: 0.19 IU/mL — ABNORMAL LOW (ref 0.30–0.70)

## 2011-10-17 MED ORDER — INSULIN ASPART 100 UNIT/ML ~~LOC~~ SOLN: 0.0000 [IU] | Freq: Every day | SUBCUTANEOUS | Status: DC

## 2011-10-17 MED ORDER — POTASSIUM CHLORIDE CRYS ER 20 MEQ PO TBCR
40.0000 meq | EXTENDED_RELEASE_TABLET | Freq: Once | ORAL | Status: AC
  Administered 2011-10-17: 40 meq via ORAL
  Filled 2011-10-17: qty 2

## 2011-10-17 MED ORDER — ENOXAPARIN SODIUM 100 MG/ML ~~LOC~~ SOLN
100.0000 mg | Freq: Two times a day (BID) | SUBCUTANEOUS | Status: DC
  Administered 2011-10-17 – 2011-10-18 (×3): 100 mg via SUBCUTANEOUS
  Filled 2011-10-17 (×4): qty 1

## 2011-10-17 MED ORDER — INSULIN ASPART 100 UNIT/ML ~~LOC~~ SOLN
0.0000 [IU] | Freq: Three times a day (TID) | SUBCUTANEOUS | Status: DC
  Administered 2011-10-17: 2 [IU] via SUBCUTANEOUS

## 2011-10-17 NOTE — Progress Notes (Signed)
Nutrition Follow-up  Intervention:   1. Discontinue Resource Breeze po TID - pt refusing 2. Continue to encourage meals as able  3. RD to continue to follow nutrition care plan  Assessment:   Noted that pt with acute on chronic bilateral extensive PE, pulmonology consulted. Pt had one episode of vomiting on 10/12. Husband confirms that other than this episode of vomiting, intake has been much improved, pt consuming up to 90% of meals. Pt does not like [employer] - will discontinue.  Skin: No skin breakdown noted. Notable labs: CBG's 84-156; potassium low @ 3.3 Pertinent meds: megace discontinued 10/10; novolog; lantus; MVI; protonix, KCl Last BM: 10/12  Diet Order:  Carbohydrate Modified Medium (1600 - 2000) Supplements: Ensure Pudding PO TID  Meds: Scheduled Meds:    . feeding supplement  1 Container Oral TID BM  . insulin glargine  4 Units Subcutaneous Daily  . labetalol  200 mg Oral TID  . levETIRAcetam  500 mg Oral BID  . lisinopril  10 mg Oral Daily  . multivitamin with minerals  1 tablet Oral Daily  . pantoprazole  40 mg Oral QHS  . sodium chloride  3 mL Intravenous Q12H  . DISCONTD: sodium chloride  1,000 mL Intravenous Once  . DISCONTD: sodium chloride  500 mL Intravenous Once   Continuous Infusions:    . heparin 1,550 Units/hr (10/17/11 0400)  . DISCONTD: dextrose 5 % and 0.45 % NaCl with KCl 20 mEq/L 50 mL/hr at 10/15/11 2117   PRN Meds:.acetaminophen, bisacodyl, cloNIDine, hydrALAZINE, HYDROmorphone (DILAUDID) injection, ondansetron (ZOFRAN) IV, DISCONTD: cloNIDine  Labs:  CMP     Component Value Date/Time   NA 138 10/17/2011 0416   K 3.3* 10/17/2011 0416   CL 102 10/17/2011 0416   CO2 25 10/17/2011 0416   GLUCOSE 101* 10/17/2011 0416   BUN 8 10/17/2011 0416   CREATININE 1.33* 10/17/2011 0416   CALCIUM 8.2* 10/17/2011 0416   PROT 6.0 10/17/2011 0416   ALBUMIN 2.4* 10/17/2011 0416   AST 23 10/17/2011 0416   ALT 10 10/17/2011 0416   ALKPHOS 54  10/17/2011 0416   BILITOT 0.5 10/17/2011 0416   GFRNONAA 42* 10/17/2011 0416   GFRAA 48* 10/17/2011 0416   CBG (last 3)   Basename 10/17/11 0854 10/16/11 0826 10/15/11 1310  GLUCAP 84 156* 132*    No recent magnesium or phosphorus.   Intake/Output Summary (Last 24 hours) at 10/17/11 0957 Last data filed at 10/17/11 0400  Gross per 24 hour  Intake  788.5 ml  Output    250 ml  Net  538.5 ml   Weight Status:  95.5 kg/210 lb; wt currently consistent with admit wt of 210 lb  Body mass index is 35.04 kg/(m^2). Obese class II  Estimated needs:  1600 - 1800 kcal, 115 - 130 grams protein  Nutrition Dx:  Inadequate oral intake r/t AMS AEB chart review and RN report. Resolved  Goal:  Pt to meet >/= 90% of their estimated nutrition needs - met  Monitor:  weight trends, lab trends, I/O's, PO intake, supplement tolerance  Jarold Motto MS, RD, LDN Pager: [phone] After-hours pager: [phone]

## 2011-10-17 NOTE — Progress Notes (Signed)
Pulmonary/Critical Care Medicine  Reason for Consult: Acute on chronic bilateral extensive PE Referring Physician: Dr. Joeseph Amor  Consults: Treatment Team:  Maeola Harman, MD     Events:  HPI: Patient is a 61 yo F with recent PMH of HTN, SAH with hydrocephalus requiring VP shunt in August 2013, ACA aneurysm requiring coiling (also in August 2013) who was readmitted on 10/08/11 for increasing AMS and fever at [facility]. Throughout her hospitalization, she has been evaluated for source of fever, which included an abdominal CT scan. On that exam, it was noted that she may have PE therefore CTA was performed. That exam revealed bilateral acute on chronic non-obstructing pulmonary embolism.There was cardiomegaly with some mild right ventricular dilatation, but no dilatation of the pulmonic trunk at this time.  She had dopplers of her lower extremities that reveal DVT of CFV, PFV and proximal FV. Patient states she has never had a blood clot before. Pulmonology was consulted for further recommendations of treatment of PE. Of note echo 9/13 showed RVSP of 57  Past Medical History  Diagnosis Date  . Hypertension     Past Surgical History  Procedure Date  . Abdominal hysterectomy   . Ventriculoperitoneal shunt 09/02/2011    Procedure: SHUNT INSERTION VENTRICULAR-PERITONEAL;  Surgeon: Maeola Harman, MD;  Location: [facility];  Service: Neurosurgery;  Laterality: Right;   Allergies:  Allergies  Allergen Reactions  . Lactose Intolerance (Gi) Other (See Comments)    G.I. Upset   Medications: I have reviewed the patient's current medications.  LABS Results for orders placed during the hospital encounter of 10/08/11 (from the past 24 hour(s))  HEPARIN LEVEL (UNFRACTIONATED)     Status: Normal   Collection Time   10/16/11  7:38 PM      Component Value Range   Heparin Unfractionated 0.35  0.30 - 0.70 IU/mL  CBC WITH DIFFERENTIAL     Status: Abnormal   Collection Time   10/17/11  4:16 AM   Component Value Range   WBC 17.1 (*) 4.0 - 10.5 K/uL   RBC 3.42 (*) 3.87 - 5.11 MIL/uL   Hemoglobin 8.7 (*) 12.0 - 15.0 g/dL   HCT 16.1 (*) 09.6 - 04.5 %   MCV 81.0  78.0 - 100.0 fL   MCH 25.4 (*) 26.0 - 34.0 pg   MCHC 31.4  30.0 - 36.0 g/dL   RDW 40.9 (*) 81.1 - 91.4 %   Platelets 239  150 - 400 K/uL   Neutrophils Relative 81 (*) 43 - 77 %   Neutro Abs 13.9 (*) 1.7 - 7.7 K/uL   Lymphocytes Relative 14  12 - 46 %   Lymphs Abs 2.4  0.7 - 4.0 K/uL   Monocytes Relative 4  3 - 12 %   Monocytes Absolute 0.7  0.1 - 1.0 K/uL   Eosinophils Relative 1  0 - 5 %   Eosinophils Absolute 0.2  0.0 - 0.7 K/uL   Basophils Relative 0  0 - 1 %   Basophils Absolute 0.0  0.0 - 0.1 K/uL  COMPREHENSIVE METABOLIC PANEL     Status: Abnormal   Collection Time   10/17/11  4:16 AM      Component Value Range   Sodium 138  135 - 145 mEq/L   Potassium 3.3 (*) 3.5 - 5.1 mEq/L   Chloride 102  96 - 112 mEq/L   CO2 25  19 - 32 mEq/L   Glucose, Bld 101 (*) 70 - 99 mg/dL   BUN  8  6 - 23 mg/dL   Creatinine, Ser 4.09 (*) 0.50 - 1.10 mg/dL   Calcium 8.2 (*) 8.4 - 10.5 mg/dL   Total Protein 6.0  6.0 - 8.3 g/dL   Albumin 2.4 (*) 3.5 - 5.2 g/dL   AST 23  0 - 37 U/L   ALT 10  0 - 35 U/L   Alkaline Phosphatase 54  39 - 117 U/L   Total Bilirubin 0.5  0.3 - 1.2 mg/dL   GFR calc non Af Amer 42 (*) >90 mL/min   GFR calc Af Amer 48 (*) >90 mL/min  HEPARIN LEVEL (UNFRACTIONATED)     Status: Abnormal   Collection Time   10/17/11  8:49 AM      Component Value Range   Heparin Unfractionated 0.19 (*) 0.30 - 0.70 IU/mL  GLUCOSE, CAPILLARY     Status: Normal   Collection Time   10/17/11  8:54 AM      Component Value Range   Glucose-Capillary 84  70 - 99 mg/dL    Blood pressure 811/91, pulse 85, temperature 98.9 F (37.2 C), temperature source Oral, resp. rate 21, height 5\' 5"  (1.651 m), weight 95.5 kg (210 lb 8.6 oz), SpO2 97.00%.  Physical Examination: General appearance - alert, well appearing, and in no distress  and does not seem interested in talking Mental status - alert, oriented to person, place, and time HEENT - AT, [place]. MMM Neck - supple, no significant adenopathy Chest - clear to auscultation, no wheezes, rales or rhonchi, symmetric air entry, Good effort with no difficulty. On room air. Heart - normal rate, regular rhythm, normal S1, S2, no murmurs, rubs, clicks or gallops Abdomen - soft, nontender, nondistended, no masses or organomegaly Neurological - alert, oriented, normal speech, no focal findings or movement disorder noted Musculoskeletal - no joint tenderness, deformity or swelling, no muscular tenderness noted Extremities - no pedal edema noted, no TTP of lower extremities/calves Skin - normal coloration and turgor, no rashes, no suspicious skin lesions noted   Assessment and Plan   PULM  Pulmonary Embolism (radiographically demonstrated, chest CTA)   Plan: Bilateral extensive non-occlusive PE. No known history. Was recently d/c from hospital, but SCD documented throughout hospital stay. No vital sign instability; On RA, no tachycardia - Given known SAH and aneurysm, neuro consulted as well. Patient is more than 2 months out from surgery - Patient has history of gait instability and endorses recent falls -can  transition to Lovenox,  - defer safety of long term anticoagulation to neurosurgery, If deemed unsafe can place IVC filter which would reduce short ter risk but would be a long term problem in itself without anticoagulation. I presented risks & benefits to pt & husband   Pulmonary hypertension - RVSP of 57 in 9/13 suggests chronic component   PCCM to sign off  Cyril Mourning MD. Tonny Bollman. [facility] Pager [phone] If no response call [phone]   10/17/2011, 11:38 AM

## 2011-10-17 NOTE — Progress Notes (Signed)
Occupational Therapy Treatment Patient Details Name: PHYLLIS ABELSON MRN: [id number] DOB: 08/03/1950 Today's Date: 10/17/2011 Time: 4098-1191 OT Time Calculation (min): 31 min  OT Assessment / Plan / Recommendation Comments on Treatment Session This 61 yo today seems more aware of items on her left today, but did show some inconsistency.    Follow Up Recommendations  Skilled nursing facility       Equipment Recommendations  None recommended by OT       Frequency Min 2X/week   Plan Discharge plan remains appropriate    Precautions / Restrictions Precautions Precautions: Fall Restrictions Weight Bearing Restrictions: No       ADL  Eating/Feeding: Performed;Set up Where Assessed - Eating/Feeding: Bed level Toilet Transfer: Simulated;Minimal assistance Toilet Transfer Method: Sit to stand Toilet Transfer Equipment:  (bed, out into hallway, back to recliner in room) Equipment Used: Rolling walker;Gait belt Transfers/Ambulation Related to ADLs: Min A due to hyperextension at times of RLE. Only once did pt have to be cued to not run into objects on the right, otherwise she navigated around them. Had her stand at board in the hallway and find objects when verbally asked--pt found all items within normal time whether they were on the right or left.     OT Goals ADL Goals ADL Goal: Toilet Transfer - Progress: Progressing toward goals Miscellaneous OT Goals OT Goal: Miscellaneous Goal #1 - Progress: Progressing toward goals  Visit Information  Last OT Received On: 10/17/11 Assistance Needed: +1 PT/OT Co-Evaluation/Treatment: Yes (working on walking/balance/and vision)          Cognition  Overall Cognitive Status: Impaired Arousal/Alertness: Awake/alert Behavior During Session: Flat affect (ocassional smiling) Current Attention Level: Selective Following Commands: Follows one step commands consistently Safety/Judgement: Decreased safety judgement for tasks assessed (the  one object on right she had to be made aware of) Safety/Judgement - Other Comments: Only min A today for safety with RW and avoiding obstacles.    Mobility   Bed Mobility Bed Mobility: Rolling Left;Left Sidelying to Sit;Sitting - Scoot to Edge of Bed Rolling Left: 4: Min assist;With rail Left Sidelying to Sit: 4: Min assist;HOB flat;With rails Sitting - Scoot to Edge of Bed: 4: Min guard Transfers Transfers: Sit to Stand;Stand to Sit Sit to Stand: 4: Min assist;With upper extremity assist;From bed Stand to Sit: 4: Min assist;With upper extremity assist;With armrests;To chair/3-in-1 Details for Transfer Assistance: Verbal cues for hand placement             End of Session OT - End of Session Equipment Utilized During Treatment: Gait belt Activity Tolerance: Patient tolerated treatment well Patient left: in chair;with call bell/phone within reach;with family/visitor present (husband)       Evette Georges [phone] 10/17/2011, 11:42 AM

## 2011-10-17 NOTE — Progress Notes (Signed)
Physical Therapy Treatment Patient Details Name: MERYN SARRACINO MRN: [id number] DOB: [birth year] Today's Date: 10/17/2011 Time: 1308-6578 PT Time Calculation (min): 27 min  PT Assessment / Plan / Recommendation Comments on Treatment Session  Patient s/p fever with decr mobility secondary to decr endurance, decr safety and decr balance.  Pt continues to have difficulty with problem solving safe techniques.  Right knee weakness noted as well.      Follow Up Recommendations  Post acute inpatient;Supervision/Assistance - 24 hour     Does the patient have the potential to tolerate intense rehabilitation  No, Recommend SNF  Barriers to Discharge        Equipment Recommendations  None recommended by PT    Recommendations for Other Services    Frequency Min 3X/week   Plan Discharge plan remains appropriate;Frequency remains appropriate    Precautions / Restrictions Precautions Precautions: Fall Restrictions Weight Bearing Restrictions: No   Pertinent Vitals/Pain VSS, No pain    Mobility  Bed Mobility Bed Mobility: Rolling Left;Left Sidelying to Sit;Sitting - Scoot to Edge of Bed Rolling Right: Not tested (comment) Rolling Left: 4: Min assist;With rail Left Sidelying to Sit: 4: Min assist;HOB flat;With rails Supine to Sit: Not tested (comment) Sitting - Scoot to Edge of Bed: 4: Min guard Sit to Supine: Not Tested (comment) Scooting to [facility]: Not tested (comment) Transfers Transfers: Sit to Stand;Stand to Sit Sit to Stand: 4: Min assist;With upper extremity assist;From bed Stand to Sit: 4: Min assist;With upper extremity assist;With armrests;To chair/3-in-1 Squat Pivot Transfers: Not tested (comment) Details for Transfer Assistance: needed cues for hand placement Ambulation/Gait Ambulation/Gait Assistance: 4: Min assist Ambulation Distance (Feet): 250 Feet Assistive device: Rolling walker Ambulation/Gait Assistance Details: consistent min assist for balance and safe RW  management.  Continues to have poor safety awareness of environment in enclosed spaces specifically.  Noted right knee hyperextension as patient fatigued today.  Pt. and husband state that they have not noticed this before.   Gait Pattern: Step-through pattern;Decreased stride length;Decreased hip/knee flexion - right;Decreased stance time - left;Right genu recurvatum Gait velocity: slow gait speed Stairs: No Wheelchair Mobility Wheelchair Mobility: No Modified Rankin (Stroke Patients Only) Modified Rankin: Severe disability    PT Goals Acute Rehab PT Goals PT Goal: Supine/Side to Sit - Progress: Progressing toward goal PT Goal: Sit to Stand - Progress: Progressing toward goal PT Goal: Stand to Sit - Progress: Progressing toward goal PT Goal: Ambulate - Progress: Progressing toward goal  Visit Information  Last PT Received On: 10/17/11 Assistance Needed: +1 PT/OT Co-Evaluation/Treatment: Yes    Subjective Data  Subjective: "I will try."  Pt. does not talk alot.   Cognition  Overall Cognitive Status: Impaired Area of Impairment: Safety/judgement;Awareness of errors;Awareness of deficits;Problem solving;Following commands;Attention Difficult to assess due to: Impaired communication Arousal/Alertness: Awake/alert Orientation Level:  (minimal verbalization) Behavior During Session: Flat affect Current Attention Level: Selective Following Commands: Follows one step commands consistently Safety/Judgement: Decreased safety judgement for tasks assessed Safety/Judgement - Other Comments: only min assist today for safety with RW and avoiding obstacles Awareness of Errors: Assistance required to identify errors made;Assistance required to correct errors made    Balance  Static Sitting Balance Static Sitting - Level of Assistance: Not tested (comment) Dynamic Sitting Balance Dynamic Sitting - Level of Assistance: Not tested (comment) Static Standing Balance Static Standing - Balance  Support: Bilateral upper extremity supported;During functional activity Static Standing - Level of Assistance: 5: Stand by assistance Static Standing - Comment/# of Minutes: with RW x  1 minute Dynamic Standing Balance Dynamic Standing - Level of Assistance: 5: Stand by assistance  End of Session PT - End of Session Equipment Utilized During Treatment: Gait belt Activity Tolerance: Patient tolerated treatment well;Patient limited by fatigue Patient left: in chair;with call bell/phone within reach;with family/visitor present Nurse Communication: Mobility status       INGOLD,Hanzel Pizzo 10/17/2011, 1:33 PM [facility] [phone] (pager)

## 2011-10-17 NOTE — Progress Notes (Signed)
ANTICOAGULATION CONSULT NOTE - Follow Up Consult  Pharmacy Consult for Heparin Indication: pulmonary embolus and DVT  Patient Measurements:  Height: 5\' 5"  (165.1 cm)  Weight: 222 lb 3.6 oz (100.8 kg)  IBW/kg (Calculated) : 57  Heparin Dosing Weight: 80  Estimated Creatinine Clearance: 65.8 ml/min (by C-G formula based on Cr of 1.03).   Labs:  [facility] 10/17/11 0849 10/17/11 0416 10/16/11 1938 10/16/11 1120 10/16/11 1012 10/15/11 0301 10/14/11 1354  HGB -- 8.7* -- 9.8* -- -- --  HCT -- 27.7* -- 30.3* -- 26.3* --  PLT -- 239 -- 241 -- 188 --  APTT -- -- -- -- -- -- --  LABPROT -- -- -- -- -- -- --  INR -- -- -- -- -- -- --  HEPARINUNFRC 0.19* -- 0.35 -- 0.18* -- --  CREATININE -- 1.33* -- -- -- -- 1.03  CKTOTAL -- -- -- -- -- -- --  CKMB -- -- -- -- -- -- --  TROPONINI -- -- -- -- -- -- --   Assessment/Plan:  61 y.o. F on heparin for PE/DVT found by CT and notable for a combination of acute and chronic emboli present. She was recently hospitalized due to [facility] that required VP shunt and coiling of aneurysm.  She was discharged to a SNF in [place] where she began having seizures.  She spiked a temperature and was taken to [facility] ED and subsequently transferred here.    Heparin level this morning is SUBtherapeutic (HL 0.19 << 0.35). Hgb/Hct drop (Hgb 8.7 << 9.8), Plts stable. No active s/sx of bleeding noted.   Goal: Heparin level 0.3-0.7 Monitor platelets by anticoagulation protocol: Yes   Plan:  1. Increase heparin drip rate to 1700 units/hr (17 ml/hr) 2. Will continue to monitor for any signs/symptoms of bleeding and will follow up with heparin level in 6 hours   Georgina Pillion, PharmD, BCPS Clinical Pharmacist Pager: [phone] 10/17/2011 10:31 AM

## 2011-10-17 NOTE — Progress Notes (Signed)
Physical Therapy Discharge Summary  Patient Details  Name: [name] MRN: [id number] Date of Birth: May 30, 1950  Today's Date: 10/17/2011  Patient has met 5 of 8 long term goals due to improved activity tolerance, improved balance, improved postural control, increased strength, ability to compensate for deficits, improved attention and improved awareness.  Patient to discharge at an ambulatory level Min Assist.     Reasons goals not met: pt continues with impaired attention and awareness limiting progress with functional mobility  Recommendation:  Patient will benefit from ongoing skilled PT services in skilled nursing facility setting to continue to advance safe functional mobility, address ongoing impairments in balance, gait, mobility, awareness, attention, and minimize fall risk.  Equipment: No equipment provided To be provided by next venue of care  Reasons for discharge: treatment goals met and discharge from hospital  Patient/family agrees with progress made and goals achieved: Yes  See FIM for current functional status  Klye Besecker 10/17/2011, 3:47 PM

## 2011-10-17 NOTE — Progress Notes (Signed)
ANTICOAGULATION CONSULT NOTE - Follow Up Consult  Pharmacy Consult for Heparin Indication: pulmonary embolus and DVT  Patient Measurements:  Height: 5\' 5"  (165.1 cm)  Weight: 222 lb 3.6 oz (100.8 kg)  IBW/kg (Calculated) : 57  Heparin Dosing Weight: 80  Estimated Creatinine Clearance: 65.8 ml/min (by C-G formula based on Cr of 1.03).   Labs:  [facility] 10/17/11 0849 10/17/11 0416 10/16/11 1938 10/16/11 1120 10/16/11 1012 10/15/11 0301 10/14/11 1354  HGB -- 8.7* -- 9.8* -- -- --  HCT -- 27.7* -- 30.3* -- 26.3* --  PLT -- 239 -- 241 -- 188 --  APTT -- -- -- -- -- -- --  LABPROT -- -- -- -- -- -- --  INR -- -- -- -- -- -- --  HEPARINUNFRC 0.19* -- 0.35 -- 0.18* -- --  CREATININE -- 1.33* -- -- -- -- 1.03  CKTOTAL -- -- -- -- -- -- --  CKMB -- -- -- -- -- -- --  TROPONINI -- -- -- -- -- -- --   Assessment/Plan:  61 y.o. F on heparin for PE/DVT found by CT and notable for a combination of acute and chronic emboli present. She was recently hospitalized due to [facility] that required VP shunt and coiling of aneurysm. Noted order to change heparin to LMWH and leave decision for long-term anticoag to neurology  Goal: AntiXa level 0.6-1.2 (4hr post-dose) Monitor platelets by anticoagulation protocol: Yes   Plan:  Change heparin to Lovenox 100mg /12h sq D/c IV heparin one hour after Lovenox administered.  Laylani Pudwill S. Merilynn Finland, PharmD, BCPS Clinical Staff Pharmacist Pager [phone]  10/17/2011 12:22 PM

## 2011-10-17 NOTE — Progress Notes (Signed)
[facility] PROGRESS NOTE  ASHELEY HELLBERG [name]:[id number] DOB: [birth year] DOA: 10/08/2011 PCP: No primary provider on file.  Assessment/Plan:  Acute and chronic bilateral extensive PE : Pt is clinically stable. Continue heparin and monitor carefully due to h/o aneurysm, shunt and coiling, USBLE showed VT in LE also. Pulm to change over to lovenox for d/c  Hypokalemia : replacing, repeat BMP   H/o subarachnoid hemorrhage with hydrocephalus: Dr. Phoebe Perch (neurosurgery) on board and will follow up with his recommendations. CT of head on 10/08/11 reported no acute findings. Did report however evolving encephalomalacia in the ACA territory. 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 Corliss Skains on 08/19/11. Per Neurosurgery likely hood of VP shunt infection low.  Seizure d/o: Recommendations per neurology. continue Keppra. Should patient have any more seizure like activity will plan on contacting neurology for further recommendations.  UTI: U/A on 10/05 showed hazy urine with large hgb and many bacteria. Urine culture with no growth. WBC elevated despite being on Levaquin. Now ceftriaxone stopped as pt is stable and no further infection identified. Vancomycin discontinued on 10/08.  Patient is afebrile   DM: Blood sugars relatively well controlled at this point. Diabetic diet and home lantus started.   Hypertension, uncontrolled: We increased the frequency of clonidine to q2 prn due to elevated BP at night. Home blood pressure medication was held initially most likely due to lower blood pressure readings on admission. At this point patient has required hydralazine and labetalol prn. continue home blood pressure medication regimen. Will continue the hydralazine prn elevated blood pressures.   Decreased oral intake: Po intake improved.  Leukocytosis: At this point suspected PE, DVT. Monitor the trend. CT scan of the chest did not show any sources of infection. Neurosurg does not suspect  neurological source based on their work up. If patient develops diarrhea- get c- diff sample, chest x ray did not show any PNA, ?appendcitis on CT scan- seen by surgery- may need to reconsult if abd pain returns  AKI- continue to monitor, resend U/A    Code Status: full Family Communication: husband at bedside Disposition Plan: rehab?  tx to [facility]  Consultants:  Surgery  Neurosurgery  pulm  uro   HPI/Subjective: Had a better night, no new c/o No fever, no chills. No SOB Mild right sided abdom pain with movement  Objective: Filed Vitals:   10/17/11 0435 10/17/11 0500 10/17/11 0840 10/17/11 0932  BP: 133/61 122/58 116/85 134/78  Pulse: 84 79 85   Temp:   98.9 F (37.2 C)   TempSrc:   Oral   Resp: 25 25 21    Height:      Weight:      SpO2: 100% 98% 97%     Intake/Output Summary (Last 24 hours) at 10/17/11 1004 Last data filed at 10/17/11 0400  Gross per 24 hour  Intake  724.5 ml  Output    250 ml  Net  474.5 ml   Filed Weights   10/15/11 0500 10/16/11 0500 10/17/11 0400  Weight: 97 kg (213 lb 13.5 oz) 97.3 kg (214 lb 8.1 oz) 95.5 kg (210 lb 8.6 oz)    Exam:   General:  Pleasant/cooperative, NAD  Cardiovascular: rrr  Respiratory: clear anterior  Abdomen: +BS, soft, NT/ND  Data Reviewed: Basic Metabolic Panel:  Lab 10/17/11 8119 10/14/11 1354 10/13/11 2138  NA 138 141 136  K 3.3* 3.3* 3.5  CL 102 106 104  CO2 25 25 21   GLUCOSE 101* 102* 94  BUN 8 8 12   CREATININE 1.33* 1.03 1.14*  CALCIUM 8.2* 8.9 8.9  MG -- -- --  PHOS -- -- --   Liver Function Tests:  Lab 10/17/11 0416 10/13/11 2138  AST 23 31  ALT 10 19  ALKPHOS 54 60  BILITOT 0.5 0.5  PROT 6.0 6.1  ALBUMIN 2.4* 2.4*   No results found for this basename: LIPASE:5,AMYLASE:5 in the last 168 hours No results found for this basename: AMMONIA:5 in the last 168 hours CBC:  Lab 10/17/11 0416 10/16/11 1120 10/15/11 0301 10/14/11 0422 10/13/11 2138  WBC 17.1* 16.2* 13.1* 11.9* 12.9*    NEUTROABS 13.9* -- -- -- --  HGB 8.7* 9.8* 8.4* 8.8* 9.4*  HCT 27.7* 30.3* 26.3* 28.3* 29.4*  MCV 81.0 80.4 81.2 80.6 80.5  PLT 239 241 188 151 136*   Cardiac Enzymes: No results found for this basename: CKTOTAL:5,CKMB:5,CKMBINDEX:5,TROPONINI:5 in the last 168 hours BNP (last 3 results) No results found for this basename: PROBNP:3 in the last 8760 hours CBG:  Lab 10/17/11 0854 10/16/11 0826 10/15/11 1310 10/15/11 0818 10/15/11 0400  GLUCAP 84 156* 132* 100* 107*    Recent Results (from the past 240 hour(s))  CULTURE, BLOOD (ROUTINE X 2)     Status: Normal   Collection Time   10/08/11  6:56 PM      Component Value Range Status Comment   Specimen Description BLOOD LEFT ANTECUBITAL   Final    Special Requests BOTTLES DRAWN AEROBIC AND ANAEROBIC 6CC   Final    Culture NO GROWTH 6 DAYS   Final    Report Status 10/14/2011 FINAL   Final   CULTURE, BLOOD (ROUTINE X 2)     Status: Normal   Collection Time   10/08/11  7:15 PM      Component Value Range Status Comment   Specimen Description BLOOD LEFT HAND   Final    Special Requests BOTTLES DRAWN AEROBIC AND ANAEROBIC 6CC   Final    Culture NO GROWTH 6 DAYS   Final    Report Status 10/14/2011 FINAL   Final   URINE CULTURE     Status: Normal   Collection Time   10/08/11  8:04 PM      Component Value Range Status Comment   Specimen Description URINE, CATHETERIZED   Final    Special Requests NONE   Final    Culture  Setup Time 10/09/2011 21:43   Final    Colony Count NO GROWTH   Final    Culture NO GROWTH   Final    Report Status 10/11/2011 FINAL   Final   MRSA PCR SCREENING     Status: Normal   Collection Time   10/09/11  1:55 AM      Component Value Range Status Comment   MRSA by PCR NEGATIVE  NEGATIVE Final      Studies: Dg Chest 2 View  10/16/2011  *RADIOLOGY REPORT*  Clinical Data: Acute pulmonary embolism.  Shortness of breath. Evaluate pleural effusion.  CHEST - 2 VIEW  Comparison: 10/08/2011  Findings: Low lung volumes  are again noted, however there is no evidence of pulmonary infiltrate or pleural effusion.  Mild cardiomegaly is stable.  Ventriculoperitoneal shunt tubing again seen in the anterior chest wall.  IMPRESSION: Stable cardiomegaly and low lung volumes.  No acute findings.   Original Report Authenticated By: Danae Orleans, M.D.     Scheduled Meds:   . feeding supplement  1 Container Oral TID BM  .  insulin aspart  0-15 Units Subcutaneous TID WC  . insulin aspart  0-5 Units Subcutaneous QHS  . insulin glargine  4 Units Subcutaneous Daily  . labetalol  200 mg Oral TID  . levETIRAcetam  500 mg Oral BID  . lisinopril  10 mg Oral Daily  . multivitamin with minerals  1 tablet Oral Daily  . pantoprazole  40 mg Oral QHS  . potassium chloride  40 mEq Oral Once  . sodium chloride  3 mL Intravenous Q12H  . DISCONTD: sodium chloride  1,000 mL Intravenous Once  . DISCONTD: sodium chloride  500 mL Intravenous Once   Continuous Infusions:   . heparin 1,550 Units/hr (10/17/11 0400)  . DISCONTD: dextrose 5 % and 0.45 % NaCl with KCl 20 mEq/L 50 mL/hr at 10/15/11 2117    Principal Problem:  *Fever Active Problems:  HTN (hypertension)  SAH (subarachnoid hemorrhage) 08/16/11  Seizure disorder, seizure 09/21/11 while in rehab  VP (ventriculoperitoneal) shunt status  Diabetes mellitus, type 2  UTI (lower urinary tract infection)  Decreased oral intake  Leukocytosis  Pulmonary embolism    Time spent: 35 min    Azzie Thiem  [facility] Pager [phone]. If 8PM-8AM, please contact night-coverage at [email or web address], password [facility] 10/17/2011, 10:04 AM  LOS: 9 days

## 2011-10-17 NOTE — Progress Notes (Addendum)
Clinical Social Work  CSW met with patient and husband at bedside. CSW spoke with patient and family regarding dc plans. Husband reports that he toured [place] and [place] this weekend. Husband prefers [place] due to it being closer to home and he can visit patient. CSW called [facility] and left a message to update facility that patient chose their facility.  CSW checked Medicaid authorization in [place] which stated authorization is still pending. CSW will continue to follow.  [place], [place] [phone]   [address] called back and reported they would be able to accept patient. SNF requests updates regarding dc date and plans. CSW will continue to follow.  [place], [place] [phone]

## 2011-10-18 LAB — URINE MICROSCOPIC-ADD ON

## 2011-10-18 LAB — URINALYSIS, ROUTINE W REFLEX MICROSCOPIC
Glucose, UA: NEGATIVE mg/dL
Hgb urine dipstick: NEGATIVE
Ketones, ur: NEGATIVE mg/dL
Protein, ur: NEGATIVE mg/dL
Urobilinogen, UA: 0.2 mg/dL (ref 0.0–1.0)

## 2011-10-18 LAB — CBC
HCT: 25.3 % — ABNORMAL LOW (ref 36.0–46.0)
Hemoglobin: 8.2 g/dL — ABNORMAL LOW (ref 12.0–15.0)
MCH: 26.6 pg (ref 26.0–34.0)
MCHC: 32.4 g/dL (ref 30.0–36.0)
MCV: 82.1 fL (ref 78.0–100.0)

## 2011-10-18 LAB — BASIC METABOLIC PANEL
BUN: 10 mg/dL (ref 6–23)
CO2: 24 mEq/L (ref 19–32)
Chloride: 105 mEq/L (ref 96–112)
Creatinine, Ser: 1.13 mg/dL — ABNORMAL HIGH (ref 0.50–1.10)

## 2011-10-18 MED ORDER — INSULIN ASPART 100 UNIT/ML ~~LOC~~ SOLN: 0.0000 [IU] | Freq: Three times a day (TID) | SUBCUTANEOUS | Status: DC

## 2011-10-18 MED ORDER — ENOXAPARIN SODIUM 100 MG/ML ~~LOC~~ SOLN: 100.0000 mg | Freq: Two times a day (BID) | SUBCUTANEOUS | Status: DC

## 2011-10-18 MED ORDER — LISINOPRIL 20 MG PO TABS
20.0000 mg | ORAL_TABLET | Freq: Every day | ORAL | Status: DC
  Administered 2011-10-18: 20 mg via ORAL
  Filled 2011-10-18: qty 1

## 2011-10-18 MED ORDER — POTASSIUM CHLORIDE CRYS ER 20 MEQ PO TBCR
40.0000 meq | EXTENDED_RELEASE_TABLET | Freq: Once | ORAL | Status: AC
  Administered 2011-10-18: 40 meq via ORAL
  Filled 2011-10-18: qty 2

## 2011-10-18 MED ORDER — LEVETIRACETAM 500 MG PO TABS: 500.0000 mg | ORAL_TABLET | Freq: Two times a day (BID) | ORAL | Status: DC

## 2011-10-18 MED ORDER — BISACODYL 10 MG RE SUPP: 10.0000 mg | Freq: Every day | RECTAL | Status: DC | PRN

## 2011-10-18 MED ORDER — POTASSIUM CHLORIDE CRYS ER 20 MEQ PO TBCR: 40.0000 meq | EXTENDED_RELEASE_TABLET | Freq: Once | ORAL | Status: DC

## 2011-10-18 MED ORDER — LISINOPRIL 20 MG PO TABS: 20.0000 mg | ORAL_TABLET | Freq: Every day | ORAL | Status: DC

## 2011-10-18 MED ORDER — INSULIN ASPART 100 UNIT/ML ~~LOC~~ SOLN: 0.0000 [IU] | Freq: Every day | SUBCUTANEOUS | Status: DC

## 2011-10-18 MED ORDER — CLONIDINE HCL 0.2 MG PO TABS: 0.2000 mg | ORAL_TABLET | ORAL | Status: DC | PRN

## 2011-10-18 NOTE — Clinical Social Work Note (Signed)
Clinical Social Work  Pt is ready for discharge today to [facility]. Facility has received discharge summary and is ready to accept pt. Pt and family are agreeable to discharge plan. [facility] will provide transportation. CSW is signing off as no further needs identified.   Dede Query, MSW, Theresia Majors [phone]

## 2011-10-18 NOTE — Discharge Summary (Signed)
Physician Discharge Summary  [name] GEX:[id number] DOB: 1950/01/27 DOA: 10/08/2011  PCP: No primary provider on file.  Admit date: 10/08/2011 Discharge date: 10/18/2011  Recommendations for Outpatient Follow-up:  1. CBC on Friday- monitor Hgb and WBCs, watch for sign of infection 2. BMP 1 week 3. Watch for re-bleeding while on lovenox 4. Patient need motivation to get up and move for rehab 5. SSI for DM  Discharge Diagnoses:  Principal Problem:  *Fever Active Problems:  HTN (hypertension)  SAH (subarachnoid hemorrhage) 08/16/11  Seizure disorder, seizure 09/21/11 while in rehab  VP (ventriculoperitoneal) shunt status  Diabetes mellitus, type 2  UTI (lower urinary tract infection)  Decreased oral intake  Leukocytosis  Pulmonary embolism   Discharge Condition: improved  Diet recommendation: diabetic/cardiac  Filed Weights   10/16/11 0500 10/17/11 0400 10/17/11 1205  Weight: 97.3 kg (214 lb 8.1 oz) 95.5 kg (210 lb 8.6 oz) 94.5 kg (208 lb 5.4 oz)    History of present illness:  [name] is an 61 y.o. female. African American lady managed by the neurosurgical service in mid August for a subarachnoid hemorrhage with hydrocephalus, required VP shunt placement by Dr. Venetia Maxon, and coiling of anterior communicating artery aneurysm by Dr. Corliss Skains. She received inpatient rehabilitation at [facility] until a few days ago when she was discharged to [facility] and [facility]. Patient apparently did have a seizure while she was in inpatient rehabilitation and was started on Keppra.  Family report a change in her status since arriving to [facility] in that she is no longer walking, not eating well, and barely speaks. A she reportedly had an episode of seizure the details of which are unclear but involve clenching of the teeth and foaming at her mouth. She was also noted to have a temperature of 105.6 rectally. At [facility] emergency room she was noted  to have a temperature of 103.6, but no clear focus of infection could be identified and hospitalist service was called to assist.  Although patient appears to understand, she mostly does not respond to them although she tracks with her eyes.  Family report that she's been breathing more heavily today and reports a cough. There has been no complaints of headache.      Hospital Course:  Acute and chronic bilateral extensive PE : Pt is clinically stable. Continue heparin and monitor carefully due to h/o aneurysm, shunt and coiling, USBLE showed VT in LE also. Pulm to change over to lovenox for d/c -- defer long term anticoagulation until patient follows up with neurosurgery- monitor carefully while patient is on anticoagulation  Hypokalemia : replacing, repeat BMP   H/o subarachnoid hemorrhage with hydrocephalus: Dr. Phoebe Perch (neurosurgery) saw patient. CT of head on 10/08/11 reported no acute findings. Did report however evolving encephalomalacia in the ACA territory. 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 Corliss Skains on 08/19/11. Per Neurosurgery likely hood of VP shunt infection low.  Seizure d/o: Recommendations per neurology. continue Keppra.   UTI: U/A on 10/05 showed hazy urine with large hgb and many bacteria. Urine culture with no growth. WBC elevated despite being on Levaquin. Now ceftriaxone stopped as pt is stable and no further infection identified. Vancomycin discontinued on 10/08. Patient is afebrile   DM: Blood sugars relatively well controlled at this point. Diabetic diet and home lantus started.   Hypertension, uncontrolled: We increased the frequency of clonidine to q2 prn due to elevated BP at night. Continue home BP medications  Decreased oral intake: Po intake improved.   Leukocytosis: At this point suspected from PE, DVT. Monitor the trend. Chest x ray showed no PNA, U/A was negative for UTI,  ?appendcitis on CT scan- seen by surgery- may need to reconsult if abd  pain returns, also if diarrhea develops could start flagyl and check for c-diff     Consultations:  pulm  Surgery  Neurology  neurosurgery  Discharge Exam: Filed Vitals:   10/18/11 0100 10/18/11 0512 10/18/11 1000 10/18/11 1027  BP: 161/68 164/74 194/95 167/82  Pulse: 77 84 87   Temp: 98.3 F (36.8 C) 99.3 F (37.4 C) 98.8 F (37.1 C)   TempSrc: Oral Oral Oral   Resp: 18 20 18    Height:      Weight:      SpO2: 100% 97% 100%     General: pleasant/cooperative Cardiovascular: rrr Respiratory: clear anterior  Discharge Instructions      Discharge Orders    Future Appointments: Provider: Department: Dept Phone: Center:   10/25/2011 10:40 AM Ranelle Oyster, MD [facility] [phone] [facility]   11/24/2011 8:30 AM [facility] [phone] [facility]     Future Orders Please Complete By Expires   Diet - low sodium heart healthy      Increase activity slowly      Discharge instructions      Comments:   Monitor CBC and WBCs   Call MD for:  persistant nausea and vomiting          Medication List     As of 10/18/2011 10:37 AM    STOP taking these medications         levofloxacin 500 MG tablet   Commonly known as: LEVAQUIN      TAKE these medications         bisacodyl 10 MG suppository   Commonly known as: DULCOLAX   Place 1 suppository (10 mg total) rectally daily as needed.      cloNIDine 0.2 MG tablet   Commonly known as: CATAPRES   Take 1 tablet (0.2 mg total) by mouth every 2 (two) hours as needed (systolic BP > 160 or diastolic > 110).      docusate sodium 100 MG capsule   Commonly known as: COLACE   Take 100 mg by mouth 2 (two) times daily.      enoxaparin 100 MG/ML injection   Commonly known as: LOVENOX   Inject 1 mL (100 mg total) into the skin every 12 (twelve) hours.      insulin aspart 100 UNIT/ML injection   Commonly known as: novoLOG   Inject 0-15 Units into the skin 3 (three) times daily with meals.      insulin  aspart 100 UNIT/ML injection   Commonly known as: novoLOG   Inject 0-5 Units into the skin at bedtime.      insulin glargine 100 UNIT/ML injection   Commonly known as: LANTUS   Inject 4 Units into the skin daily.      labetalol 200 MG tablet   Commonly known as: NORMODYNE   Take 200 mg by mouth 3 (three) times daily.      levETIRAcetam 500 MG tablet   Commonly known as: KEPPRA   Take 1 tablet (500 mg total) by mouth 2 (two) times daily.      lisinopril 20 MG tablet   Commonly known as: PRINIVIL,ZESTRIL   Take 1 tablet (20 mg total) by mouth daily.  methylphenidate 10 MG tablet   Commonly known as: RITALIN   Take 10 mg by mouth 2 (two) times daily.      pantoprazole 40 MG tablet   Commonly known as: PROTONIX   Take 40 mg by mouth daily.      potassium chloride SA 20 MEQ tablet   Commonly known as: K-DUR,KLOR-CON   Take 2 tablets (40 mEq total) by mouth once.            The results of significant diagnostics from this hospitalization (including imaging, microbiology, ancillary and laboratory) are listed below for reference.    Significant Diagnostic Studies: Dg Chest 2 View  10/16/2011  *RADIOLOGY REPORT*  Clinical Data: Acute pulmonary embolism.  Shortness of breath. Evaluate pleural effusion.  CHEST - 2 VIEW  Comparison: 10/08/2011  Findings: Low lung volumes are again noted, however there is no evidence of pulmonary infiltrate or pleural effusion.  Mild cardiomegaly is stable.  Ventriculoperitoneal shunt tubing again seen in the anterior chest wall.  IMPRESSION: Stable cardiomegaly and low lung volumes.  No acute findings.   Original Report Authenticated By: Danae Orleans, M.D.    Ct Head Wo Contrast  10/08/2011  *RADIOLOGY REPORT*  Clinical Data: Status post embolization of anterior communicating artery aneurysm after subarachnoid hemorrhage.  CT HEAD WITHOUT CONTRAST  Technique:  Contiguous axial images were obtained from the base of the skull through the vertex  without contrast.  Comparison: 09/21/2011  Findings: Stable coarse and positioning of ventriculostomy catheter.  No evidence of hydrocephalus or intraventricular blood. Evolving encephalomalacia in the left ACA territory and inferior right frontal lobe without progressive infarction since the prior study.  No evidence of acute hemorrhage, extra-axial fluid collections or mass effect.  The skull is unremarkable.  Aneurysm coils again identified.  IMPRESSION: No acute findings.  Evolving encephalomalacia in the ACA territory of the brain.   Original Report Authenticated By: Reola Calkins, M.D.    Ct Head Wo Contrast  09/21/2011  *RADIOLOGY REPORT*  Clinical Data: Seizure  CT HEAD WITHOUT CONTRAST  Technique:  Contiguous axial images were obtained from the base of the skull through the vertex without contrast.  Comparison: 09/12/2011  Findings: There is a right frontal ventriculostomy catheter with tip terminating in the region of the third ventricle.  The ventricular volumes are within normal limits.  Subacute infarct involving bilateral frontal lobes, left corpus callosum and left basal ganglia are identified.  The appearance is unchanged from previous examination.  No evidence for acute infarct or intracranial hemorrhage. Aneurysm coil mass is unchanged. The mastoid air cells and the paranasal sinuses are clear.  The skull appears intact.  IMPRESSION:  1.  Stable exam.  No evidence for worsening infarction, new hemorrhage or hydrocephalus.   Original Report Authenticated By: Rosealee Albee, M.D.    Ct Chest Wo Contrast  10/08/2011  *RADIOLOGY REPORT*  Clinical Data: Fever.  CT CHEST WITHOUT CONTRAST  Technique:  Multidetector CT imaging of the chest was performed following the standard protocol without IV contrast.  Comparison: Chest x-ray earlier today.  Findings: There is cardiomegaly.  Bibasilar atelectasis present. No pleural effusions.  Aorta is tortuous, non-aneurysmal. No mediastinal, hilar, or  axillary adenopathy.  Visualized thyroid and chest wall soft tissues unremarkable. Imaging into the upper abdomen shows no acute findings.   No acute bony abnormality.  IMPRESSION: Cardiomegaly, bibasilar atelectasis.   Original Report Authenticated By: Cyndie Chime, M.D.    Ct Angio Chest Pe W/cm &/or Wo  Cm  10/14/2011  *RADIOLOGY REPORT*  Clinical Data: Evaluate for possible pulmonary embolism.  Suspected filling defect in the pulmonary artery noted on recent CT of the abdomen and pelvis.  CT ANGIOGRAPHY CHEST  Technique:  Multidetector CT imaging of the chest using the standard protocol during bolus administration of intravenous contrast. Multiplanar reconstructed images including MIPs were obtained and reviewed to evaluate the vascular anatomy.  Contrast:  80 ml of Omnipaque 350.  Comparison: CT of the abdomen and pelvis 10/13/2011.  Chest CT 10/08/2011.  Findings:  Mediastinum: There are numerous filling defects throughout the pulmonary arterial tree bilaterally.  The largest defect is in the distal right main pulmonary artery, however, defects are present within the lobar, segmental and subsegmental sized branches bilaterally.  Many of these are eccentrically located and intimately associated with the wall of the vessels, suggesting chronicity, while other defects are more centrally located. Defects do not appear to be nonocclusive at this time.  Heart size is enlarged.  The pulmonic trunk does not appear to be dilated at this time, suggesting against pulmonary arterial hypertension. However, there is some right ventricular dilatation. There is no significant pericardial fluid, thickening or pericardial calcification. No pathologically enlarged mediastinal or hilar lymph nodes. Esophagus is unremarkable in appearance.  Lungs/Pleura: A small amount of dependent atelectasis is noted in the lower lobes of the lungs bilaterally.  No acute consolidative airspace disease.  No pleural effusions.  No definite  suspicious appearing pulmonary nodules or masses are identified.  Upper Abdomen: Unremarkable.  Musculoskeletal: There are no aggressive appearing lytic or blastic lesions noted in the visualized portions of the skeleton.  IMPRESSION: 1.  Study is positive for a large volume of pulmonary emboli throughout the lungs bilaterally, as detailed above.  Notably, there appears to be a combination of acute and chronic emboli present. 2.  Cardiomegaly with some mild right ventricular dilatation, but no dilatation of the pulmonic trunk at this time. Given the dilatation of the right ventricle, correlation for elevated right- sided heart pressures may be warranted.  These results were called by telephone on 10/14/2011 at 05:00 p.m. to Dr. Allena Katz, who verbally acknowledged these results.   Original Report Authenticated By: Florencia Reasons, M.D.    Ct Abdomen Pelvis W Contrast  10/13/2011  *RADIOLOGY REPORT*  Clinical Data: Persistent leukocytosis, right lower abdominal pain, hypertension, diabetes  CT ABDOMEN AND PELVIS WITH CONTRAST  Technique:  Multidetector CT imaging of the abdomen and pelvis was performed following the standard protocol during bolus administration of intravenous contrast. Sagittal and coronal MPR images reconstructed from axial data set.  Contrast: 80mL OMNIPAQUE IOHEXOL 300 MG/ML  SOLN Dilute oral contrast.  Comparison: None  Findings: Right basilar atelectasis and tiny pleural effusion. Questionable filling defect in left lower lobe pulmonary artery image #1, cannot exclude pulmonary embolism. VP shunt tubing and abdominal cavity. Liver, spleen, pancreas, left kidney, and adrenal glands normal appearance. Minimally enlarged right kidney with impaired nephrogram, delayed contrast excretion, and ureteral dilatation suggesting distal obstruction. Periureteral edema is seen to urinary bladder. No obstructing ureteral calcification identified. Free intraperitoneal fluid is present though the patient has  a VP shunt.  Nonspecific presacral edema extending to perirectal fat. Left inguinal hernia containing fat. Question slightly increased density within gallbladder focally cannot exclude gallstone. Stomach decompressed. Large and small bowel loops grossly unremarkable. A thickened tubular structure seen extending into the cecum into the right pelvis, cannot exclude acute appendicitis. The patient had a prior hysterectomy and suspect left ovarian  excision. Additional soft tissue in the right pelvis which could represent an ovary or be related to an inflammatory process. Surgical status of the appendix and right ovary is uncertain. Air in urinary bladder. Remaining bowel loops. No adenopathy, mass, or free intraperitoneal air. Single tiny focus of gas identified in inferior right pelvis, uncertain etiology, image 76. No acute osseous findings.  IMPRESSION: Inflammatory process in the right pelvis where amorphous soft tissue and diffuse tissue infiltration is identified, though uncertain if the by portion of this represents the right ovary. A thickened tubular structure is extending inferiorly from the cecum, image 68, potentially enlarged appendix. Surgical status of the appendix and right ovary is uncertain. Acute appendicitis is not excluded. Differential diagnosis however includes other inflammatory processes of pelvis. Free pelvic fluid could be related to the above inflammatory process or potentially VP shunt. Nonspecific presacral edema. Likely secondary right ureteral obstruction with impaired right renal function.  Questionable filling defect in left lower lobe pulmonary artery, cannot exclude pulmonary embolism; recommend dedicated CTA imaging of the chest or radionuclide ventilation perfusion lung scan for further assessment.  Cannot exclude cholelithiasis. Left inguinal hernia containing fat.  Findings called to Dr. Cena Benton on 10/13/2011 at 1630 hours.   Original Report Authenticated By: Lollie Marrow, M.D.     Nm Myocar Multi W/spect W/wall Motion / Ef  09/26/2011  *RADIOLOGY REPORT*  Clinical Data:  61 year old female with coronary artery disease. The the a the  MYOCARDIAL IMAGING WITH SPECT (REST AND PHARMACOLOGIC-STRESS) GATED LEFT VENTRICULAR WALL MOTION STUDY LEFT VENTRICULAR EJECTION FRACTION  Technique:  Resting myocardial SPECT imaging was initially performed after intravenous administration of radiopharmaceutical. Myocardial SPECT was subsequently performed after additional radiopharmaceutical injection during pharmacologic-stress supervised by the Cardiology staff.  Quantitative gated imaging was also performed to evaluate left ventricular wall motion, and estimate left ventricular ejection fraction.  Radiopharmaceutical:  Tc-6m Myoview at rest and during stress.  Comparison: none  Findings:  Technique: Study is adequate.  Perfusion:  There are no relative decreased counts on stress or rest to suggest reversible ischemia or infarction.  Wall motion:   Hypokinesia of the lateral wall.  Left ventricular ejection fraction:  Calculated left ventricular ejection fraction =  15%  IMPRESSION:  1. No reversible ischemia or infarction. 2.  Lateral wall hypokinesia.  3.  Left ventricular ejection fraction equal 15%   Original Report Authenticated By: Genevive Bi, M.D.    Dg Chest Portable 1 View  10/08/2011  *RADIOLOGY REPORT*  Clinical Data: Seizure.  Fever.  PORTABLE CHEST - 1 VIEW  Comparison: 09/03/2011  Findings: Artifact overlies chest.  Shunt tube overlies the chest. There is a poor inspiration.  Basilar atelectasis or infiltrate is not excluded.  Upper lungs are clear.  No bony abnormalities seen.  IMPRESSION: Poor inspiration.  Cannot rule out atelectasis or infiltrate at the lung bases.  Otherwise negative.   Original Report Authenticated By: Thomasenia Sales, M.D.     Microbiology: Recent Results (from the past 240 hour(s))  CULTURE, BLOOD (ROUTINE X 2)     Status: Normal    Collection Time   10/08/11  6:56 PM      Component Value Range Status Comment   Specimen Description BLOOD LEFT ANTECUBITAL   Final    Special Requests BOTTLES DRAWN AEROBIC AND ANAEROBIC 6CC   Final    Culture NO GROWTH 6 DAYS   Final    Report Status 10/14/2011 FINAL   Final   CULTURE, BLOOD (ROUTINE  X 2)     Status: Normal   Collection Time   10/08/11  7:15 PM      Component Value Range Status Comment   Specimen Description BLOOD LEFT HAND   Final    Special Requests BOTTLES DRAWN AEROBIC AND ANAEROBIC 6CC   Final    Culture NO GROWTH 6 DAYS   Final    Report Status 10/14/2011 FINAL   Final   URINE CULTURE     Status: Normal   Collection Time   10/08/11  8:04 PM      Component Value Range Status Comment   Specimen Description URINE, CATHETERIZED   Final    Special Requests NONE   Final    Culture  Setup Time 10/09/2011 21:43   Final    Colony Count NO GROWTH   Final    Culture NO GROWTH   Final    Report Status 10/11/2011 FINAL   Final   MRSA PCR SCREENING     Status: Normal   Collection Time   10/09/11  1:55 AM      Component Value Range Status Comment   MRSA by PCR NEGATIVE  NEGATIVE Final      Labs: Basic Metabolic Panel:  Lab 10/18/11 1610 10/17/11 0416 10/14/11 1354 10/13/11 2138  NA 139 138 141 136  K 3.4* 3.3* 3.3* 3.5  CL 105 102 106 104  CO2 24 25 25 21   GLUCOSE 81 101* 102* 94  BUN 10 8 8 12   CREATININE 1.13* 1.33* 1.03 1.14*  CALCIUM 8.5 8.2* 8.9 8.9  MG -- -- -- --  PHOS -- -- -- --   Liver Function Tests:  Lab 10/17/11 0416 10/13/11 2138  AST 23 31  ALT 10 19  ALKPHOS 54 60  BILITOT 0.5 0.5  PROT 6.0 6.1  ALBUMIN 2.4* 2.4*   No results found for this basename: LIPASE:5,AMYLASE:5 in the last 168 hours No results found for this basename: AMMONIA:5 in the last 168 hours CBC:  Lab 10/17/11 0416 10/16/11 1120 10/15/11 0301 10/14/11 0422 10/13/11 2138  WBC 17.1* 16.2* 13.1* 11.9* 12.9*  NEUTROABS 13.9* -- -- -- --  HGB 8.7* 9.8* 8.4* 8.8* 9.4*   HCT 27.7* 30.3* 26.3* 28.3* 29.4*  MCV 81.0 80.4 81.2 80.6 80.5  PLT 239 241 188 151 136*   Cardiac Enzymes: No results found for this basename: CKTOTAL:5,CKMB:5,CKMBINDEX:5,TROPONINI:5 in the last 168 hours BNP: BNP (last 3 results) No results found for this basename: PROBNP:3 in the last 8760 hours CBG:  Lab 10/18/11 0641 10/17/11 2114 10/17/11 1709 10/17/11 1228 10/17/11 0854  GLUCAP 84 96 95 135* 84    Time coordinating discharge: 35 minutes  Signed:  Benjamine Mola, Eaden Hettinger  [facility] 10/18/2011, 10:37 AM

## 2011-10-25 ENCOUNTER — Encounter: Payer: Medicaid Other | Attending: Physical Medicine & Rehabilitation | Admitting: Physical Medicine & Rehabilitation

## 2011-10-25 ENCOUNTER — Encounter: Payer: Self-pay | Admitting: Physical Medicine & Rehabilitation

## 2011-10-25 VITALS — BP 152/85 | HR 65 | Resp 14 | Ht 65.0 in | Wt 208.0 lb

## 2011-10-25 DIAGNOSIS — F341 Dysthymic disorder: Secondary | ICD-10-CM | POA: Insufficient documentation

## 2011-10-25 DIAGNOSIS — Z9181 History of falling: Secondary | ICD-10-CM | POA: Insufficient documentation

## 2011-10-25 DIAGNOSIS — I609 Nontraumatic subarachnoid hemorrhage, unspecified: Secondary | ICD-10-CM

## 2011-10-25 DIAGNOSIS — F32A Depression, unspecified: Secondary | ICD-10-CM

## 2011-10-25 DIAGNOSIS — N390 Urinary tract infection, site not specified: Secondary | ICD-10-CM

## 2011-10-25 DIAGNOSIS — F329 Major depressive disorder, single episode, unspecified: Secondary | ICD-10-CM

## 2011-10-25 DIAGNOSIS — Z982 Presence of cerebrospinal fluid drainage device: Secondary | ICD-10-CM | POA: Insufficient documentation

## 2011-10-25 DIAGNOSIS — I69998 Other sequelae following unspecified cerebrovascular disease: Secondary | ICD-10-CM | POA: Insufficient documentation

## 2011-10-25 DIAGNOSIS — G40909 Epilepsy, unspecified, not intractable, without status epilepticus: Secondary | ICD-10-CM

## 2011-10-25 NOTE — Patient Instructions (Signed)
1. Continue/resume therapies at the SNF. Needs to work on standing balance, gait, trunk and pelvic girdle strength. 2. Begin low dose antidepressant: celexa 10mg  at bedtime. Consider increase to 20mg  in 2-3 weeks time depending on presentation. 3. Continue on ritalin for initiation and arousal for now.  4. Follow up with me in about 2 months.

## 2011-10-25 NOTE — Progress Notes (Signed)
Subjective:    Patient ID: [name], female    DOB: 1950-06-02, 61 y.o.   MRN: [id number]  HPI  Mrs. Schlotzhauer is back regarding her SAH and left internal capsule infarct. She was back in the hospital for a seizure, elevated BP and elevated temp. It appears she had a UTI.Therapy is just now getting back involved again at the SNF. She has fallen twice after getting up late at night to go to the bathroom. Appetite is hit or miss and patient doesn't initiate much on her own.   She denies any pain currently. She doesn't disclose much at all really upon ROS.   Pain Inventory Average Pain 0 Pain Right Now 0 My pain is n/a  In the last 24 hours, has pain interfered with the following? General activity 5 Relation with others 5 Enjoyment of life 5 What TIME of day is your pain at its worst? n/a Sleep (in general) Fair  Pain is worse with: some activites Pain improves with: rest Relief from Meds: n/a  Mobility walk with assistance ability to climb steps?  no do you drive?  no  Function employed # of hrs/week [employer] I need assistance with the following:  feeding, dressing, bathing, toileting, meal prep, household duties and shopping  Neuro/Psych weakness trouble walking  Prior Studies Any changes since last visit?  no  Physicians involved in your care Any changes since last visit?  no   History reviewed. No pertinent family history. History   Social History  . Marital Status: Married    Spouse Name: N/A    Number of Children: N/A  . Years of Education: N/A   Social History Main Topics  . Smoking status: Never Smoker   . Smokeless tobacco: None  . Alcohol Use: No  . Drug Use: No  . Sexually Active:    Other Topics Concern  . None   Social History Narrative  . None   Past Surgical History  Procedure Date  . Abdominal hysterectomy   . Ventriculoperitoneal shunt 09/02/2011    Procedure: SHUNT INSERTION VENTRICULAR-PERITONEAL;  Surgeon: Maeola Harman, MD;  Location: [facility];  Service: Neurosurgery;  Laterality: Right;   Past Medical History  Diagnosis Date  . Hypertension   . Stroke    BP 152/85  Pulse 65  Resp 14  Ht 5\' 5"  (1.651 m)  Wt 208 lb (94.348 kg)  BMI 34.61 kg/m2  SpO2 100%     Review of Systems  Musculoskeletal: Positive for gait problem.  Neurological: Positive for weakness.  All other systems reviewed and are negative.       Objective:   Physical Exam   General: Alert and oriented to name, date with extra time and some cueing.  HEENT: Head is normocephalic, atraumatic, PERRLA, EOMI, sclera anicteric, oral mucosa pink and moist, dentition intact, ext ear canals clear,  Neck: Supple without JVD or lymphadenopathy Heart: Reg rate and rhythm. No murmurs rubs or gallops Chest: CTA bilaterally without wheezes, rales, or rhonchi; no distress Abdomen: Soft, non-tender, non-distended, bowel sounds positive. Extremities: No clubbing, cyanosis, or edema. Pulses are 2+ Skin: Clean and intact without signs of breakdown Neuro: Pt follow simple commands and occasionally answers questions but lacks insight, awareness and initiation. Cranial nerves 2-12 are grossly intact. Sensory exam is normal. Reflexes are 2+ in all 4's. Fine motor coordination is fair. No tremors. Motor function is grossly 4/5 and 3-4/5 in the LE's today. She has fair sitting balance but  poor posture. She stood with min assist but had a difficult time maintaining standing posture. Musculoskeletal: Full ROM, No pain with ROM or PROM in the neck, trunk, or extremities. Posture appropriate Psych: Pt's affect is rather flat. She occasionally laughs.        Assessment & Plan:   1. subarachnoid hemorrhage with small infarct left internal capsule: Status post IVC placement 08/18/2011 with coiling of aneurysm 08/19/2011 and VP shunt placement 09/02/2011  2. Depression- reactive 3. Seizure disorder related to the above.  4. Recurrent  UTI   Plan: 1. Continue/resume therapies at the SNF. Needs to work on standing balance, gait, trunk and pelvic girdle strength, initiation. 2. Begin low dose antidepressant: celexa 10mg  at bedtime. Consider increase to 20mg  in 2-3 weeks time depending on presentation. Certainly her presentation may be related to her significant brain insults as well, however I am concerned enough over her history and presentation that I think this should be treated. Husband agrees. 3. Continue on ritalin for initiation and arousal for now.  4. Follow up with me in about 2 months. 30 minutes of face to face patient care time were spent during this visit. All questions were encouraged and answered.

## 2011-11-17 ENCOUNTER — Other Ambulatory Visit: Payer: Self-pay | Admitting: Radiology

## 2011-11-20 ENCOUNTER — Other Ambulatory Visit: Payer: Self-pay | Admitting: Physician Assistant

## 2011-11-24 ENCOUNTER — Ambulatory Visit (HOSPITAL_COMMUNITY)
Admit: 2011-11-24 | Discharge: 2011-11-24 | Disposition: A | Discharge status: Home / Self Care | Payer: Medicaid Other | Source: Ambulatory Visit | Attending: Interventional Radiology | Admitting: Interventional Radiology

## 2011-11-24 ENCOUNTER — Encounter (HOSPITAL_COMMUNITY): Payer: Self-pay | Admitting: Pharmacy Technician

## 2011-11-24 VITALS — BP 158/91 | HR 56 | Temp 97.6°F | Resp 18 | Ht 65.0 in | Wt 195.0 lb

## 2011-11-24 DIAGNOSIS — I609 Nontraumatic subarachnoid hemorrhage, unspecified: Secondary | ICD-10-CM

## 2011-11-24 DIAGNOSIS — I671 Cerebral aneurysm, nonruptured: Secondary | ICD-10-CM | POA: Insufficient documentation

## 2011-11-24 DIAGNOSIS — Z538 Procedure and treatment not carried out for other reasons: Secondary | ICD-10-CM | POA: Insufficient documentation

## 2011-11-24 DIAGNOSIS — IMO0001 Reserved for inherently not codable concepts without codable children: Secondary | ICD-10-CM

## 2011-11-24 LAB — BASIC METABOLIC PANEL
BUN: 11 mg/dL (ref 6–23)
CO2: 25 mEq/L (ref 19–32)
Calcium: 9.9 mg/dL (ref 8.4–10.5)
Creatinine, Ser: 1.15 mg/dL — ABNORMAL HIGH (ref 0.50–1.10)
GFR calc non Af Amer: 50 mL/min — ABNORMAL LOW (ref >90)
Glucose, Bld: 93 mg/dL (ref 70–99)
Sodium: 140 mEq/L (ref 135–145)

## 2011-11-24 LAB — CBC
MCH: 26.4 pg (ref 26.0–34.0)
MCHC: 31.2 g/dL (ref 30.0–36.0)
MCV: 84.6 fL (ref 78.0–100.0)
Platelets: 244 10*3/uL (ref 150–400)
RDW: 14.6 % (ref 11.5–15.5)

## 2011-11-24 LAB — PROTIME-INR: Prothrombin Time: 23.2 seconds — ABNORMAL HIGH (ref 11.6–15.2)

## 2011-11-24 MED ORDER — SODIUM CHLORIDE 0.45 % IV SOLN
INTRAVENOUS | Status: DC
  Administered 2011-11-24: 07:00:00 via INTRAVENOUS

## 2011-12-02 ENCOUNTER — Encounter (HOSPITAL_COMMUNITY): Payer: Self-pay | Admitting: Respiratory Therapy

## 2011-12-05 ENCOUNTER — Other Ambulatory Visit: Payer: Self-pay | Admitting: Radiology

## 2011-12-06 ENCOUNTER — Encounter (HOSPITAL_COMMUNITY): Payer: Self-pay

## 2011-12-06 ENCOUNTER — Ambulatory Visit (HOSPITAL_COMMUNITY)
Admission: RE | Admit: 2011-12-06 | Discharge: 2011-12-06 | Disposition: A | Discharge status: Home / Self Care | Payer: Medicaid Other | Source: Ambulatory Visit | Attending: Interventional Radiology | Admitting: Interventional Radiology

## 2011-12-06 VITALS — BP 145/93 | HR 76 | Temp 98.7°F | Resp 20 | Ht 65.0 in | Wt 195.0 lb

## 2011-12-06 DIAGNOSIS — I10 Essential (primary) hypertension: Secondary | ICD-10-CM | POA: Insufficient documentation

## 2011-12-06 DIAGNOSIS — Z8673 Personal history of transient ischemic attack (TIA), and cerebral infarction without residual deficits: Secondary | ICD-10-CM | POA: Insufficient documentation

## 2011-12-06 DIAGNOSIS — I609 Nontraumatic subarachnoid hemorrhage, unspecified: Secondary | ICD-10-CM

## 2011-12-06 DIAGNOSIS — Z9889 Other specified postprocedural states: Secondary | ICD-10-CM | POA: Insufficient documentation

## 2011-12-06 DIAGNOSIS — IMO0001 Reserved for inherently not codable concepts without codable children: Secondary | ICD-10-CM

## 2011-12-06 DIAGNOSIS — I671 Cerebral aneurysm, nonruptured: Secondary | ICD-10-CM | POA: Insufficient documentation

## 2011-12-06 DIAGNOSIS — Z7901 Long term (current) use of anticoagulants: Secondary | ICD-10-CM | POA: Insufficient documentation

## 2011-12-06 DIAGNOSIS — Z86711 Personal history of pulmonary embolism: Secondary | ICD-10-CM | POA: Insufficient documentation

## 2011-12-06 DIAGNOSIS — Z09 Encounter for follow-up examination after completed treatment for conditions other than malignant neoplasm: Secondary | ICD-10-CM | POA: Insufficient documentation

## 2011-12-06 DIAGNOSIS — E119 Type 2 diabetes mellitus without complications: Secondary | ICD-10-CM | POA: Insufficient documentation

## 2011-12-06 HISTORY — DX: Nontraumatic subarachnoid hemorrhage, unspecified: I60.9

## 2011-12-06 HISTORY — DX: Other pulmonary embolism without acute cor pulmonale: I26.99

## 2011-12-06 HISTORY — DX: Epilepsy, unspecified, not intractable, without status epilepticus: G40.909

## 2011-12-06 LAB — BASIC METABOLIC PANEL
CO2: 24 mEq/L (ref 19–32)
Calcium: 10 mg/dL (ref 8.4–10.5)
Creatinine, Ser: 1.17 mg/dL — ABNORMAL HIGH (ref 0.50–1.10)
Glucose, Bld: 95 mg/dL (ref 70–99)

## 2011-12-06 LAB — PROTIME-INR
INR: 1.17 (ref 0.00–1.49)
Prothrombin Time: 14.7 seconds (ref 11.6–15.2)

## 2011-12-06 LAB — CBC
Hemoglobin: 9.9 g/dL — ABNORMAL LOW (ref 12.0–15.0)
MCHC: 31.2 g/dL (ref 30.0–36.0)
Platelets: 258 10*3/uL (ref 150–400)
RDW: 14.8 % (ref 11.5–15.5)

## 2011-12-06 MED ORDER — FENTANYL CITRATE 0.05 MG/ML IJ SOLN
INTRAMUSCULAR | Status: AC
  Filled 2011-12-06: qty 2

## 2011-12-06 MED ORDER — LABETALOL HCL 5 MG/ML IV SOLN
INTRAVENOUS | Status: AC
  Filled 2011-12-06: qty 4

## 2011-12-06 MED ORDER — FENTANYL CITRATE 0.05 MG/ML IJ SOLN
INTRAMUSCULAR | Status: AC | PRN
  Administered 2011-12-06: 25 ug via INTRAVENOUS

## 2011-12-06 MED ORDER — MIDAZOLAM HCL 2 MG/2ML IJ SOLN
INTRAMUSCULAR | Status: AC | PRN
Start: 2011-12-06 — End: 2011-12-06
  Administered 2011-12-06: 1 mg via INTRAVENOUS

## 2011-12-06 MED ORDER — IOHEXOL 300 MG/ML  SOLN
150.0000 mL | Freq: Once | INTRAMUSCULAR | Status: AC | PRN
  Administered 2011-12-06: 50 mL via INTRA_ARTERIAL

## 2011-12-06 MED ORDER — HYDRALAZINE HCL 20 MG/ML IJ SOLN
INTRAMUSCULAR | Status: AC
  Filled 2011-12-06: qty 1

## 2011-12-06 MED ORDER — SODIUM CHLORIDE 0.45 % IV SOLN: INTRAVENOUS | Status: DC

## 2011-12-06 MED ORDER — HYDRALAZINE HCL 20 MG/ML IJ SOLN
INTRAMUSCULAR | Status: AC | PRN
  Administered 2011-12-06 (×4): 5 mg via INTRAVENOUS

## 2011-12-06 MED ORDER — SODIUM CHLORIDE 0.9 % IV SOLN: INTRAVENOUS | Status: AC

## 2011-12-06 MED ORDER — MIDAZOLAM HCL 2 MG/2ML IJ SOLN
INTRAMUSCULAR | Status: AC
  Filled 2011-12-06: qty 2

## 2011-12-06 NOTE — Discharge Instructions (Signed)

## 2011-12-06 NOTE — ED Notes (Signed)
Requested bed from short stay; spoke it [facility]

## 2011-12-06 NOTE — H&P (Signed)
[name] is an 61 y.o. female.   Chief Complaint: presents today for angiogram. Patient has been off coumadin for procedure today.  HPI: SAH due to aneurysm rupture s/p coiling 08/16/11. Patient presents today for repeat angiogram while off coumadin - covered by [employer] in interim. Patient unable to offer much information regarding ROS - spouse present. Currently in SNF getting rehab.   Past Medical History  Diagnosis Date  . Hypertension   . Stroke   . PE (pulmonary embolism)   . Seizure disorder   . SAH (subarachnoid hemorrhage) 08/16/11  . Depression, reactive   . Diabetes mellitus     Past Surgical History  Procedure Date  . Abdominal hysterectomy   . Ventriculoperitoneal shunt 09/02/2011    Procedure: SHUNT INSERTION VENTRICULAR-PERITONEAL;  Surgeon: Maeola Harman, MD;  Location: [facility];  Service: Neurosurgery;  Laterality: Right;  . Aneurysm coiling 08/16/11    No family history on file. Social History:  reports that she has never smoked. She does not have any smokeless tobacco history on file. She reports that she does not drink alcohol or use illicit drugs.  Allergies:  Allergies  Allergen Reactions  . Lactose Intolerance (Gi) Other (See Comments)    G.I. Upset  . Penicillins     Doesn't remember what happens    Results for orders placed during the hospital encounter of 12/06/11 (from the past 48 hour(s))  APTT     Status: Abnormal   Collection Time   12/06/11  7:04 AM      Component Value Range Comment   aPTT 39 (*) 24 - 37 seconds   BASIC METABOLIC PANEL     Status: Abnormal   Collection Time   12/06/11  7:04 AM      Component Value Range Comment   Sodium 138  135 - 145 mEq/L    Potassium 5.8 (*) 3.5 - 5.1 mEq/L HEMOLYSIS AT THIS LEVEL MAY AFFECT RESULT   Chloride 106  96 - 112 mEq/L    CO2 24  19 - 32 mEq/L    Glucose, Bld 95  70 - 99 mg/dL    BUN 18  6 - 23 mg/dL    Creatinine, Ser 0.86 (*) 0.50 - 1.10 mg/dL    Calcium 57.8  8.4 - 10.5 mg/dL    GFR calc non Af Amer 49 (*) >90 mL/min    GFR calc Af Amer 56 (*) >90 mL/min   CBC     Status: Abnormal   Collection Time   12/06/11  7:04 AM      Component Value Range Comment   WBC 5.4  4.0 - 10.5 K/uL    RBC 3.66 (*) 3.87 - 5.11 MIL/uL    Hemoglobin 9.9 (*) 12.0 - 15.0 g/dL    HCT 46.9 (*) 62.9 - 46.0 %    MCV 86.6  78.0 - 100.0 fL    MCH 27.0  26.0 - 34.0 pg    MCHC 31.2  30.0 - 36.0 g/dL    RDW 52.8  41.3 - 24.4 %    Platelets 258  150 - 400 K/uL   PROTIME-INR     Status: Normal   Collection Time   12/06/11  7:04 AM      Component Value Range Comment   Prothrombin Time 14.7  11.6 - 15.2 seconds    INR 1.17  0.00 - 1.49     Review of Systems  Constitutional: Positive for malaise/fatigue. Negative for fever and chills.  HENT: Negative for hearing loss.   Eyes: Negative.   Respiratory: Negative.   Cardiovascular: Negative.   Gastrointestinal: Negative for nausea, vomiting and abdominal pain.  Musculoskeletal: Positive for falls.  Skin: Negative.   Neurological: Positive for sensory change, focal weakness, seizures and weakness. Negative for dizziness, tremors, speech change, loss of consciousness and headaches.  Endo/Heme/Allergies: Bruises/bleeds easily.  Psychiatric/Behavioral: Positive for depression and memory loss. Negative for substance abuse. The patient does not have insomnia.     Blood pressure 172/91, pulse 65, temperature 98.7 F (37.1 C), temperature source Oral, resp. rate 18, height 5\' 5"  (1.651 m), weight 195 lb (88.451 kg), SpO2 100.00%. Physical Exam  Constitutional: She appears well-developed. No distress.  HENT:  Head: Normocephalic and atraumatic.       Smile symmetrically, tongue midline, clear speech when verbalizes   Cardiovascular: Normal heart sounds.   Respiratory: Effort normal and breath sounds normal. No respiratory distress. She has no wheezes. She has no rales.  GI: Soft. Bowel sounds are normal.  Musculoskeletal: She exhibits no edema  and no tenderness.       3/5 strength bilateral LE, 4/5 UE bilaterally    Neurological: She is alert.       Flat affect, smiles appropriately - will answer some questions  Skin: Skin is warm and dry.  Psychiatric:       Flat affect     Assessment/Plan Patient presents today for angiogram. Patient with [facility] and CVA 08/2011 who is currently in SNF obtaining rehabilitation for weakness, unsteady gait and ADL assistance.  She has been on coumadin for PE; which has been held and INR WNL to proceed today.   Discussed with spouse in detail procedure for angiogram, risks and benefits of angiogram in her treatment planning with all his questions answered to his satisfaction. Written consent obtain. Mildly elevated creatinine - history reveals mild elevated chronically.   CAMPBELL,PAMELA D 12/06/2011, 8:19 AM

## 2011-12-06 NOTE — Procedures (Signed)
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

## 2011-12-21 ENCOUNTER — Encounter: Payer: Self-pay | Admitting: Physical Medicine & Rehabilitation

## 2011-12-21 ENCOUNTER — Encounter: Payer: Medicaid Other | Attending: Physical Medicine & Rehabilitation | Admitting: Physical Medicine & Rehabilitation

## 2011-12-21 VITALS — BP 147/97 | HR 68 | Resp 14 | Ht 64.0 in | Wt 202.0 lb

## 2011-12-21 DIAGNOSIS — F3289 Other specified depressive episodes: Secondary | ICD-10-CM

## 2011-12-21 DIAGNOSIS — F329 Major depressive disorder, single episode, unspecified: Secondary | ICD-10-CM

## 2011-12-21 DIAGNOSIS — I69998 Other sequelae following unspecified cerebrovascular disease: Secondary | ICD-10-CM | POA: Insufficient documentation

## 2011-12-21 DIAGNOSIS — G40909 Epilepsy, unspecified, not intractable, without status epilepticus: Secondary | ICD-10-CM

## 2011-12-21 DIAGNOSIS — I609 Nontraumatic subarachnoid hemorrhage, unspecified: Secondary | ICD-10-CM

## 2011-12-21 DIAGNOSIS — F341 Dysthymic disorder: Secondary | ICD-10-CM | POA: Insufficient documentation

## 2011-12-21 DIAGNOSIS — Z982 Presence of cerebrospinal fluid drainage device: Secondary | ICD-10-CM | POA: Insufficient documentation

## 2011-12-21 DIAGNOSIS — Z9181 History of falling: Secondary | ICD-10-CM | POA: Insufficient documentation

## 2011-12-21 NOTE — Progress Notes (Signed)
Subjective:    Patient ID: [name], female    DOB: October 20, 1950, 61 y.o.   MRN: [id number]   HPI  Shakedra is back regarding her SAH. The SNF is working on balance currently. They are walking her regularly.   The plan is for her to go home soon. The family is working on dispo.  She is continent of bowels and bladder. She has been sleeping well. Appetite has been spotty.  We initiated celexa at last visit and this has been helpful for her mood.  She continues on the ritalin.   Pain Inventory Average Pain 0 Pain Right Now 0 My pain is n/a  In the last 24 hours, has pain interfered with the following? General activity 0 Relation with others 0 Enjoyment of life 0 What TIME of day is your pain at its worst? night Sleep (in general) Good  Pain is worse with: n/a Pain improves with: n/a Relief from Meds: 10  Mobility walk with assistance use a walker ability to climb steps?  no do you drive?  yes use a wheelchair needs help with transfers Do you have any goals in this area?  yes  Function not employed: date last employed  I need assistance with the following:  bathing, toileting, meal prep, household duties and shopping Do you have any goals in this area?  yes  Neuro/Psych No problems in this area  Prior Studies Any changes since last visit?  yes pulmonary embolism  Physicians involved in your care Any changes since last visit?  no   History reviewed. No pertinent family history. History   Social History  . Marital Status: Married    Spouse Name: N/A    Number of Children: N/A  . Years of Education: N/A   Social History Main Topics  . Smoking status: Never Smoker   . Smokeless tobacco: None  . Alcohol Use: No  . Drug Use: No  . Sexually Active:    Other Topics Concern  . None   Social History Narrative  . None   Past Surgical History  Procedure Date  . Abdominal hysterectomy   . Ventriculoperitoneal shunt 09/02/2011    Procedure: SHUNT  INSERTION VENTRICULAR-PERITONEAL;  Surgeon: Maeola Harman, MD;  Location: [facility];  Service: Neurosurgery;  Laterality: Right;  . Aneurysm coiling 08/16/11   Past Medical History  Diagnosis Date  . Hypertension   . Stroke   . PE (pulmonary embolism)   . Seizure disorder   . SAH (subarachnoid hemorrhage) 08/16/11  . Depression, reactive   . Diabetes mellitus   . Esophageal reflux   . Heart disease   . Hypopotassemia   . Attention deficit disorder of childhood with hyperactivity    BP 147/97  Pulse 68  Resp 14  Ht 5\' 4"  (1.626 m)  Wt 202 lb (91.627 kg)  BMI 34.67 kg/m2  SpO2 98%     Review of Systems  Cardiovascular: Positive for leg swelling.  All other systems reviewed and are negative.       Objective:   Physical Exam  General: Alert and oriented to name, date with extra time with cueing. Still flat.  HEENT: Head is normocephalic, atraumatic, PERRLA, EOMI, sclera anicteric, oral mucosa pink and moist, dentition intact, ext ear canals clear,  Neck: Supple without JVD or lymphadenopathy  Heart: Reg rate and rhythm. No murmurs rubs or gallops  Chest: CTA bilaterally without wheezes, rales, or rhonchi; no distress  Abdomen: Soft, non-tender, non-distended, bowel sounds positive.  Extremities: No clubbing, cyanosis, or edema. Pulses are 2+  Skin: Clean and intact without signs of breakdown  Neuro: Pt follow simple commands and occasionally answers questions but lacks insight, awareness and initiation. Cranial nerves 2-12 are grossly intact. Sensory exam is normal. Reflexes are 2+ in all 4's. Fine motor coordination is fair. No tremors. Motor function is grossly 5/5 in all 4's. She stood with out assistance and displayed good balance. i kept a hand on her only for safety.  Musculoskeletal: Full ROM, No pain with ROM or PROM in the neck, trunk, or extremities. Posture appropriate  Psych: Pt's affect is flat but she displayed more emotions and as a whole interacted more  today.   Assessment & Plan:   1. subarachnoid hemorrhage with small infarct left internal capsule: Status post IVC placement 08/18/2011 with coiling of aneurysm 08/19/2011 and VP shunt placement 09/02/2011  2. Depression- reactive- improved 3. Seizure disorder related to the above.  4. Recurrent UTI   Plan:  1. Continue with therapies until dc'ed from SNF. Home health therapy afterward. 2. Would stay with celexa for mood as this seems to have helped. i will consider weaning this once she gets home.  3. Continue on ritalin for initiation and arousal for now. i think this could be weaned over the winter.  4. Follow up with me in about 2 months. 30 minutes of face to face patient care time were spent during this visit. All questions were encouraged and answered.

## 2011-12-21 NOTE — Patient Instructions (Signed)
MAINTAIN CELEXA AND RITALIN AT CURRENT DOSES. I WILL ADJUST THESE AFTER SHE RETURNS HOME.   I WOUL LIKE HER TO HAVE HOME HEALTH PT AND OT ONCE [facility] RETURNS HOME

## 2011-12-26 ENCOUNTER — Ambulatory Visit: Payer: Medicaid Other | Admitting: Physical Medicine & Rehabilitation

## 2012-02-09 ENCOUNTER — Encounter (HOSPITAL_COMMUNITY): Payer: Self-pay | Admitting: Emergency Medicine

## 2012-02-09 ENCOUNTER — Inpatient Hospital Stay (HOSPITAL_COMMUNITY)
Admission: EM | Admit: 2012-02-09 | Discharge: 2012-02-12 | DRG: 682 | Disposition: A | Discharge status: Skilled Nursing Facility | Payer: Medicaid Other | Attending: Internal Medicine | Admitting: Internal Medicine

## 2012-02-09 ENCOUNTER — Emergency Department (HOSPITAL_COMMUNITY): Payer: Medicaid Other

## 2012-02-09 DIAGNOSIS — E875 Hyperkalemia: Secondary | ICD-10-CM | POA: Diagnosis present

## 2012-02-09 DIAGNOSIS — I2699 Other pulmonary embolism without acute cor pulmonale: Secondary | ICD-10-CM | POA: Diagnosis present

## 2012-02-09 DIAGNOSIS — R509 Fever, unspecified: Secondary | ICD-10-CM | POA: Diagnosis present

## 2012-02-09 DIAGNOSIS — Z79899 Other long term (current) drug therapy: Secondary | ICD-10-CM

## 2012-02-09 DIAGNOSIS — K219 Gastro-esophageal reflux disease without esophagitis: Secondary | ICD-10-CM | POA: Diagnosis present

## 2012-02-09 DIAGNOSIS — J9600 Acute respiratory failure, unspecified whether with hypoxia or hypercapnia: Secondary | ICD-10-CM

## 2012-02-09 DIAGNOSIS — Z90710 Acquired absence of both cervix and uterus: Secondary | ICD-10-CM

## 2012-02-09 DIAGNOSIS — E162 Hypoglycemia, unspecified: Secondary | ICD-10-CM | POA: Diagnosis present

## 2012-02-09 DIAGNOSIS — Z7901 Long term (current) use of anticoagulants: Secondary | ICD-10-CM

## 2012-02-09 DIAGNOSIS — R82998 Other abnormal findings in urine: Secondary | ICD-10-CM | POA: Diagnosis present

## 2012-02-09 DIAGNOSIS — Z982 Presence of cerebrospinal fluid drainage device: Secondary | ICD-10-CM

## 2012-02-09 DIAGNOSIS — F3289 Other specified depressive episodes: Secondary | ICD-10-CM | POA: Diagnosis present

## 2012-02-09 DIAGNOSIS — R1110 Vomiting, unspecified: Secondary | ICD-10-CM

## 2012-02-09 DIAGNOSIS — F329 Major depressive disorder, single episode, unspecified: Secondary | ICD-10-CM | POA: Diagnosis present

## 2012-02-09 DIAGNOSIS — E739 Lactose intolerance, unspecified: Secondary | ICD-10-CM | POA: Diagnosis present

## 2012-02-09 DIAGNOSIS — Z880 Allergy status to penicillin: Secondary | ICD-10-CM

## 2012-02-09 DIAGNOSIS — E860 Dehydration: Secondary | ICD-10-CM | POA: Diagnosis present

## 2012-02-09 DIAGNOSIS — R112 Nausea with vomiting, unspecified: Secondary | ICD-10-CM | POA: Diagnosis present

## 2012-02-09 DIAGNOSIS — A088 Other specified intestinal infections: Secondary | ICD-10-CM | POA: Diagnosis present

## 2012-02-09 DIAGNOSIS — J690 Pneumonitis due to inhalation of food and vomit: Secondary | ICD-10-CM

## 2012-02-09 DIAGNOSIS — X31XXXA Exposure to excessive natural cold, initial encounter: Secondary | ICD-10-CM | POA: Diagnosis present

## 2012-02-09 DIAGNOSIS — G9340 Encephalopathy, unspecified: Secondary | ICD-10-CM | POA: Diagnosis present

## 2012-02-09 DIAGNOSIS — A419 Sepsis, unspecified organism: Secondary | ICD-10-CM | POA: Diagnosis present

## 2012-02-09 DIAGNOSIS — R197 Diarrhea, unspecified: Secondary | ICD-10-CM | POA: Diagnosis present

## 2012-02-09 DIAGNOSIS — E1169 Type 2 diabetes mellitus with other specified complication: Secondary | ICD-10-CM | POA: Diagnosis present

## 2012-02-09 DIAGNOSIS — Z8673 Personal history of transient ischemic attack (TIA), and cerebral infarction without residual deficits: Secondary | ICD-10-CM

## 2012-02-09 DIAGNOSIS — R6510 Systemic inflammatory response syndrome (SIRS) of non-infectious origin without acute organ dysfunction: Secondary | ICD-10-CM

## 2012-02-09 DIAGNOSIS — N179 Acute kidney failure, unspecified: Principal | ICD-10-CM | POA: Diagnosis present

## 2012-02-09 DIAGNOSIS — I10 Essential (primary) hypertension: Secondary | ICD-10-CM | POA: Diagnosis present

## 2012-02-09 DIAGNOSIS — E119 Type 2 diabetes mellitus without complications: Secondary | ICD-10-CM | POA: Diagnosis present

## 2012-02-09 DIAGNOSIS — I959 Hypotension, unspecified: Secondary | ICD-10-CM | POA: Diagnosis present

## 2012-02-09 DIAGNOSIS — Z86711 Personal history of pulmonary embolism: Secondary | ICD-10-CM

## 2012-02-09 DIAGNOSIS — Z794 Long term (current) use of insulin: Secondary | ICD-10-CM

## 2012-02-09 DIAGNOSIS — T68XXXA Hypothermia, initial encounter: Secondary | ICD-10-CM | POA: Diagnosis present

## 2012-02-09 DIAGNOSIS — G40909 Epilepsy, unspecified, not intractable, without status epilepticus: Secondary | ICD-10-CM | POA: Diagnosis present

## 2012-02-09 LAB — CBC WITH DIFFERENTIAL/PLATELET
Basophils Relative: 0 % (ref 0–1)
Eosinophils Absolute: 0.2 10*3/uL (ref 0.0–0.7)
Hemoglobin: 13.1 g/dL (ref 12.0–15.0)
MCH: 26.6 pg (ref 26.0–34.0)
MCHC: 31.7 g/dL (ref 30.0–36.0)
Monocytes Relative: 7 % (ref 3–12)
Neutrophils Relative %: 69 % (ref 43–77)

## 2012-02-09 LAB — PROTIME-INR: INR: 2.42 — ABNORMAL HIGH (ref 0.00–1.49)

## 2012-02-09 LAB — COMPREHENSIVE METABOLIC PANEL
AST: 15 U/L (ref 0–37)
Albumin: 3.7 g/dL (ref 3.5–5.2)
Calcium: 10.1 mg/dL (ref 8.4–10.5)
Creatinine, Ser: 4.13 mg/dL — ABNORMAL HIGH (ref 0.50–1.10)
Total Protein: 8.5 g/dL — ABNORMAL HIGH (ref 6.0–8.3)

## 2012-02-09 LAB — GLUCOSE, CAPILLARY

## 2012-02-09 MED ORDER — LEVETIRACETAM 500 MG PO TABS
500.0000 mg | ORAL_TABLET | Freq: Two times a day (BID) | ORAL | Status: DC
  Administered 2012-02-10: 500 mg via ORAL
  Filled 2012-02-09 (×3): qty 1

## 2012-02-09 MED ORDER — LABETALOL HCL 200 MG PO TABS: 200.0000 mg | ORAL_TABLET | Freq: Three times a day (TID) | ORAL | Status: DC

## 2012-02-09 MED ORDER — PANTOPRAZOLE SODIUM 40 MG PO TBEC: 40.0000 mg | DELAYED_RELEASE_TABLET | Freq: Every day | ORAL | Status: DC

## 2012-02-09 MED ORDER — MIRTAZAPINE 7.5 MG PO TABS
7.5000 mg | ORAL_TABLET | Freq: Every day | ORAL | Status: DC
  Administered 2012-02-10: 7.5 mg via ORAL
  Filled 2012-02-09: qty 1

## 2012-02-09 MED ORDER — ONDANSETRON HCL 4 MG/2ML IJ SOLN
4.0000 mg | Freq: Once | INTRAMUSCULAR | Status: AC
  Administered 2012-02-09: 4 mg via INTRAVENOUS
  Filled 2012-02-09: qty 2

## 2012-02-09 MED ORDER — SODIUM CHLORIDE 0.9 % IJ SOLN
3.0000 mL | Freq: Two times a day (BID) | INTRAMUSCULAR | Status: DC
  Administered 2012-02-10 – 2012-02-12 (×3): 3 mL via INTRAVENOUS

## 2012-02-09 MED ORDER — INSULIN GLARGINE 100 UNIT/ML ~~LOC~~ SOLN: 4.0000 [IU] | Freq: Every day | SUBCUTANEOUS | Status: DC

## 2012-02-09 MED ORDER — INSULIN ASPART 100 UNIT/ML ~~LOC~~ SOLN: 0.0000 [IU] | SUBCUTANEOUS | Status: DC

## 2012-02-09 MED ORDER — FERROUS SULFATE 325 (65 FE) MG PO TABS
325.0000 mg | ORAL_TABLET | Freq: Three times a day (TID) | ORAL | Status: DC
  Filled 2012-02-09 (×4): qty 1

## 2012-02-09 MED ORDER — WARFARIN SODIUM 5 MG PO TABS
5.5000 mg | ORAL_TABLET | Freq: Every day | ORAL | Status: DC
  Filled 2012-02-09: qty 1

## 2012-02-09 MED ORDER — SODIUM CHLORIDE 0.9 % IV SOLN
1000.0000 mL | Freq: Once | INTRAVENOUS | Status: AC
  Administered 2012-02-09: 1000 mL via INTRAVENOUS

## 2012-02-09 MED ORDER — ALBUTEROL SULFATE (5 MG/ML) 0.5% IN NEBU
10.0000 mg | INHALATION_SOLUTION | Freq: Once | RESPIRATORY_TRACT | Status: AC
  Administered 2012-02-09: 10 mg via RESPIRATORY_TRACT
  Filled 2012-02-09: qty 2

## 2012-02-09 MED ORDER — WARFARIN - PHARMACIST DOSING INPATIENT: Freq: Every day | Status: DC

## 2012-02-09 MED ORDER — SODIUM CHLORIDE 0.9 % IV BOLUS (SEPSIS)
1000.0000 mL | Freq: Once | INTRAVENOUS | Status: AC
  Administered 2012-02-10: 1000 mL via INTRAVENOUS

## 2012-02-09 MED ORDER — CITALOPRAM HYDROBROMIDE 20 MG PO TABS
20.0000 mg | ORAL_TABLET | Freq: Every day | ORAL | Status: DC
  Filled 2012-02-09: qty 1

## 2012-02-09 MED ORDER — BISACODYL 10 MG RE SUPP: 10.0000 mg | Freq: Every day | RECTAL | Status: DC | PRN

## 2012-02-09 MED ORDER — INSULIN ASPART 100 UNIT/ML ~~LOC~~ SOLN: 0.0000 [IU] | Freq: Three times a day (TID) | SUBCUTANEOUS | Status: DC

## 2012-02-09 MED ORDER — INSULIN ASPART 100 UNIT/ML IV SOLN
10.0000 [IU] | INTRAVENOUS | Status: AC
  Administered 2012-02-09: 10 [IU] via INTRAVENOUS
  Filled 2012-02-09 (×2): qty 0.1

## 2012-02-09 MED ORDER — DEXTROSE 50 % IV SOLN
1.0000 | Freq: Once | INTRAVENOUS | Status: AC
  Administered 2012-02-09 – 2012-02-10 (×2): 50 mL via INTRAVENOUS
  Filled 2012-02-09: qty 50

## 2012-02-09 MED ORDER — DOCUSATE SODIUM 100 MG PO CAPS
100.0000 mg | ORAL_CAPSULE | Freq: Two times a day (BID) | ORAL | Status: DC
  Filled 2012-02-09 (×3): qty 1

## 2012-02-09 MED ORDER — CLONIDINE HCL 0.2 MG PO TABS
0.2000 mg | ORAL_TABLET | ORAL | Status: DC | PRN
  Filled 2012-02-09: qty 1

## 2012-02-09 MED ORDER — SODIUM CHLORIDE 0.9 % IV SOLN
1000.0000 mL | INTRAVENOUS | Status: DC
  Administered 2012-02-09: 1000 mL via INTRAVENOUS

## 2012-02-09 NOTE — ED Notes (Signed)
[facility], the facility where the patient resides called due to the patient having a four day history of nausea, vomiting and diarrhea and the patient being cool and clammy.  The facility also took the patient's temperature at noon (rectal) and it was 103.4.  Patient is there for rehab, however the patient's husband was not able to elaborate if the patient is there for the a PE or a stroke.

## 2012-02-09 NOTE — ED Notes (Signed)
Family at bedside. 

## 2012-02-09 NOTE — Progress Notes (Signed)
ANTICOAGULATION CONSULT NOTE - Initial Consult  Pharmacy Consult for Coumadin Indication: H/o PE  Allergies  Allergen Reactions  . Lactose Intolerance (Gi) Other (See Comments)    G.I. Upset  . Penicillins     Doesn't remember what happens    Patient Measurements:    Vital Signs: Temp: 98.1 F (36.7 C) (02/06 1844) Temp src: Oral (02/06 1844) BP: 108/67 mmHg (02/06 2200) Pulse Rate: 74  (02/06 2200)  Labs:  [facility] 02/09/12 1915  HGB 13.1  HCT 41.3  PLT 182  APTT --  LABPROT 25.2*  INR 2.42*  HEPARINUNFRC --  CREATININE 4.13*  CKTOTAL --  CKMB --  TROPONINI --    The CrCl is unknown because both a height and weight (above a minimum accepted value) are required for this calculation.   Medical History: Past Medical History  Diagnosis Date  . Hypertension   . Stroke   . PE (pulmonary embolism)   . Seizure disorder   . SAH (subarachnoid hemorrhage) 08/16/11  . Depression, reactive   . Diabetes mellitus   . Esophageal reflux   . Heart disease   . Hypopotassemia   . Attention deficit disorder of childhood with hyperactivity     Medications:  Scheduled:    . albuterol  10 mg Nebulization Once  . citalopram  20 mg Oral Daily  . dextrose  1 ampule Intravenous Once  . docusate sodium  100 mg Oral BID  . ferrous sulfate  325 mg Oral TID WC  . insulin aspart  0-12 Units Subcutaneous TID AC & HS  . insulin aspart  10 Units Intravenous STAT  . insulin glargine  4 Units Subcutaneous QHS  . levETIRAcetam  500 mg Oral BID  . mirtazapine  7.5 mg Oral QHS  . [COMPLETED] ondansetron  4 mg Intravenous Once  . pantoprazole  40 mg Oral Daily  . [DISCONTINUED] labetalol  200 mg Oral TID    Assessment: 62 yo female admitted with vomiting and diarrhea. Patient on Coumadin 5.5mg  daily PTA for h/o VTE. Pharmacy to manage Coumadin. INR (2.42) is at-goal.   Goal of Therapy:  INR 2-3 Monitor platelets by anticoagulation protocol: Yes   Plan:  1. Continue home  Coumadin regimen. 2. Daily PT / INR 3. Coumadin education with pharmacist.   Thad Ranger, Mellody Drown 02/09/2012,10:39 PM

## 2012-02-09 NOTE — H&P (Signed)
[facility] History and Physical  [name] RUE:[id number] DOB: [birth year] DOA: 02/09/2012  Referring physician: ED PCP: No primary provider on file.  Specialists: None  Chief Complaint: N/V/D  HPI: [name] is a 62 y.o. female who presents to the ED with a four day h/o N/V/D and fever of 103.4.  Unfortunately this has caused the patient to become very dehydrated.  Nothing seems to make this better or worse.  She does have some cramping quality pain located in her abdomen but not severe pain.  In the ED patient was found to be in AKF with creatinine of 4 (baseline 1.1), potassium of 6.6, no EKG changes, has been given insulin, glucose, and fluids, hospitalist asked to admit.  Review of Systems: 12 systems reviewed and otherwise negative.  Past Medical History  Diagnosis Date  . Hypertension   . Stroke   . PE (pulmonary embolism)   . Seizure disorder   . SAH (subarachnoid hemorrhage) 08/16/11  . Depression, reactive   . Diabetes mellitus   . Esophageal reflux   . Heart disease   . Hypopotassemia   . Attention deficit disorder of childhood with hyperactivity    Past Surgical History  Procedure Date  . Abdominal hysterectomy   . Ventriculoperitoneal shunt 09/02/2011    Procedure: SHUNT INSERTION VENTRICULAR-PERITONEAL;  Surgeon: Maeola Harman, MD;  Location: [facility];  Service: Neurosurgery;  Laterality: Right;  . Aneurysm coiling 08/16/11   Social History:  reports that she has never smoked. She does not have any smokeless tobacco history on file. She reports that she does not drink alcohol or use illicit drugs. Patient does live in a NH.  Allergies  Allergen Reactions  . Lactose Intolerance (Gi) Other (See Comments)    G.I. Upset  . Penicillins     Doesn't remember what happens    History reviewed. No pertinent family history. No one else in family ill recently.  Prior to Admission medications   Medication Sig Start Date End Date Taking?  Authorizing Provider  allopurinol (ZYLOPRIM) 150 mg TABS Take 150 mg by mouth daily.   Yes Historical Provider, MD  bisacodyl (DULCOLAX) 10 MG suppository Place 10 mg rectally daily as needed. For constipation.   Yes Historical Provider, MD  citalopram (CELEXA) 20 MG tablet Take 20 mg by mouth daily.   Yes Historical Provider, MD  cloNIDine (CATAPRES) 0.2 MG tablet Take 1 tablet (0.2 mg total) by mouth every 2 (two) hours as needed (systolic BP > 160 or diastolic > 110). 10/18/11  Yes Joseph Art, DO  docusate sodium (COLACE) 100 MG capsule Take 100 mg by mouth 2 (two) times daily.   Yes Historical Provider, MD  ferrous sulfate 325 (65 FE) MG tablet Take 325 mg by mouth 3 (three) times daily with meals.    Yes Historical Provider, MD  insulin aspart (NOVOLOG) 100 UNIT/ML injection Inject 0-12 Units into the skin 4 (four) times daily -  before meals and at bedtime. Sliding scale with meals and bedtime coverage   Yes Historical Provider, MD  insulin glargine (LANTUS) 100 UNIT/ML injection Inject 4 Units into the skin at bedtime.    Yes Historical Provider, MD  labetalol (NORMODYNE) 200 MG tablet Take 200 mg by mouth 3 (three) times daily.   Yes Historical Provider, MD  levETIRAcetam (KEPPRA) 500 MG tablet Take 1 tablet (500 mg total) by mouth 2 (two) times daily. 10/18/11  Yes Joseph Art, DO  lisinopril (PRINIVIL,ZESTRIL) 20 MG tablet  Take 1 tablet (20 mg total) by mouth daily. 10/18/11  Yes Joseph Art, DO  methylphenidate (RITALIN) 5 MG tablet Take 5 mg by mouth 2 (two) times daily. Morning and noon   Yes Historical Provider, MD  mirtazapine (REMERON) 15 MG tablet Take 7.5 mg by mouth at bedtime.   Yes Historical Provider, MD  pantoprazole (PROTONIX) 40 MG tablet Take 40 mg by mouth daily.   Yes Historical Provider, MD  potassium chloride SA (K-DUR,KLOR-CON) 20 MEQ tablet Take 40 mEq by mouth daily.   Yes Historical Provider, MD  warfarin (COUMADIN) 2.5 MG tablet Take 2.5 mg by mouth daily.  Take with 3mg  tablet for total dose of 5.5mg    Yes Historical Provider, MD  warfarin (COUMADIN) 3 MG tablet Take 3 mg by mouth daily. Take with 2.5mg  tablet for total of 5.5mg    Yes Historical Provider, MD   Physical Exam: Filed Vitals:   02/09/12 1844 02/09/12 2038 02/09/12 2200  BP: 100/69 96/66 108/67  Pulse: 91  74  Temp: 98.1 F (36.7 C)    TempSrc: Oral    Resp: 20 18 18   SpO2: 97% 96% 97%    General:  NAD, resting comfortably in bed Eyes: PEERLA EOMI ENT: mucous membranes moist Neck: supple w/o JVD Cardiovascular: RRR w/o MRG Respiratory: CTA B Abdomen: soft, mild diffuse tenderness, nd, bs+ Skin: no rash nor lesion Musculoskeletal: MAE, full ROM all 4 extremities Psychiatric: normal tone and affect Neurologic: AAOx3, grossly non-focal  Labs on Admission:  Basic Metabolic Panel:  Lab 02/09/12 1610  NA 136  K 6.6*  CL 97  CO2 26  GLUCOSE 130*  BUN 29*  CREATININE 4.13*  CALCIUM 10.1  MG --  PHOS --   Liver Function Tests:  Lab 02/09/12 1915  AST 15  ALT 8  ALKPHOS 71  BILITOT 0.4  PROT 8.5*  ALBUMIN 3.7    Lab 02/09/12 1915  LIPASE 57  AMYLASE --   No results found for this basename: AMMONIA:5 in the last 168 hours CBC:  Lab 02/09/12 1915  WBC 7.9  NEUTROABS 5.5  HGB 13.1  HCT 41.3  MCV 83.9  PLT 182   Cardiac Enzymes: No results found for this basename: CKTOTAL:5,CKMB:5,CKMBINDEX:5,TROPONINI:5 in the last 168 hours  BNP (last 3 results) No results found for this basename: PROBNP:3 in the last 8760 hours CBG: No results found for this basename: GLUCAP:5 in the last 168 hours  Radiological Exams on Admission: Ct Head Wo Contrast  02/09/2012  *RADIOLOGY REPORT*  Clinical Data: Nausea, emesis, diarrhea  CT HEAD WITHOUT CONTRAST  Technique:  Contiguous axial images were obtained from the base of the skull through the vertex without contrast.  Comparison: 10/08/2011; 09/21/2011  Findings:  Redemonstrated prior ACA distribution infarcts  involving the bilateral frontal lobes, left greater than right.  Unchanged lacunar infarcts within the anterior horn of the left lateral ventricle and left basal ganglia.  Grossly unchanged rather extensive periventricular hypodensity compatible microvascular ischemic disease. Given extensive background parenchymal abnormalities, there is no CT evidence of acute large territory infarct.  Right frontal approach ventriculostomy catheter again terminates regional to the third ventricle.  The ventricles and basilar cisterns are unchanged in size configuration.  No intraparenchymal or extra-axial mass or hemorrhage.  No midline shift.  Embolization coils are again seen regional to the effected location of the ACAs.  Limited visualization of paranasal sinuses and mastoid air cells are normal.  Regional soft tissues are normal. No displaced calvarial fracture.  IMPRESSION: 1.  No acute findings. 2.  Grossly unchanged sequela of prior ACA distribution infarct. 3.  Unchanged right frontal approach ventriculostomy catheter with unchanged size of the ventricles and basilar cisterns.   Original Report Authenticated By: Tacey Ruiz, MD    Dg Abd Acute W/chest  02/09/2012  *RADIOLOGY REPORT*  Clinical Data: Nausea, emesis, history of seizure disorder  ACUTE ABDOMEN SERIES (ABDOMEN 2 VIEW & CHEST 1 VIEW)  Comparison: Chest radiograph - 10/16/2011; 10/08/2011; CT abdomen pelvis - 10/13/2011; chest CT - 10/14/2011  Findings:  Grossly unchanged cardiac silhouette and mediastinal contours with mild tortuosity ectasia of the thoracic aorta. Possible developing air space opacity within the medial aspect the right upper lung. No pleural effusion or pneumothorax.  Moderate gaseous distension of the colon without definite evidence of obstruction.  No pneumoperitoneum, pneumatosis or portal venous gas.  Ventriculoperitoneal tubing courses along the right mid clavicular line is coiled within the left lower abdominal quadrant.  No definite  evidence of catheter kink or fracture/discontinuity.  No definite abnormal intra-abdominal calcifications.  No acute osseous abnormalities.  IMPRESSION: 1.  Possible developing air space opacity in the medial aspect of right upper lung for infection.  Further evaluation with a PA and lateral chest radiograph may be obtained as clinically indicated.  2. Moderate gas distension of the colon without evidence of obstruction. 3.  Ventriculoperitoneal catheter tubing without discrete evidence of kink or fracture.   Original Report Authenticated By: Tacey Ruiz, MD     EKG: Independently reviewed.  Assessment/Plan Principal Problem:  *Acute kidney failure Active Problems:  Diabetes mellitus, type 2  Fever  Hyperkalemia  Nausea vomiting and diarrhea   1. AKF - likely secondary to dehydration although the low BUN is somewhat unusual for this.  Rehydrating with NS, urine lytes pending and UA pending. 2. Hyperkalemia - acute on chronic, some hemolysis in the 6.6 sample so rechecking now, also giving insluin and glucose and rechecking after that.  Kayexelate unlikely to be overly effective in patient given ongoing N/V/D, if cannot correct with medical management then patient will need nephrology consult tomorrow. 3. N/V/D with Fever - likely a viral gastroenteritis at this point, if symptoms worsen then may need CT abdomen. 4. DM2 - hold home insulin and put on med dose SSI given she is NPO.    Code Status: Full Code (must indicate code status--if unknown or must be presumed, indicate so) Family Communication: Spoke with husband at bedside (indicate person spoken with, if applicable, with phone number if by telephone) Disposition Plan: Admit to inpatient (indicate anticipated LOS)  Time spent: 70 min  [place] M. [facility] Pager [phone]  If 7PM-7AM, please contact night-coverage [email or web address] Password [facility] 02/09/2012, 10:48 PM

## 2012-02-09 NOTE — ED Notes (Signed)
MD at bedside. 

## 2012-02-09 NOTE — ED Provider Notes (Signed)
History     CSN: [id number]  Arrival date & time 02/09/12  2841   First MD Initiated Contact with Patient 02/09/12 1841      Chief Complaint  Patient presents with  . Nausea  . Emesis  . Diarrhea    Patient is a 62 y.o. female presenting with vomiting and diarrhea. The history is provided by the patient.  Emesis  This is a new problem. The current episode started more than 2 days ago (3-4 days ago). The problem occurs 2 to 4 times per day. The maximum temperature recorded prior to her arrival was 103 to 104 F. Associated symptoms include arthralgias, diarrhea and a fever. Pertinent negatives include no headaches.  Diarrhea The primary symptoms include fever, vomiting, diarrhea and arthralgias. Primary symptoms do not include dysuria.  The diarrhea is watery. The diarrhea occurs 2 to 4 times per day.  SHe also has had some abdominal cramping.  No chest pain or shortness of breath.   Past Medical History  Diagnosis Date  . Hypertension   . Stroke   . PE (pulmonary embolism)   . Seizure disorder   . SAH (subarachnoid hemorrhage) 08/16/11  . Depression, reactive   . Diabetes mellitus   . Esophageal reflux   . Heart disease   . Hypopotassemia   . Attention deficit disorder of childhood with hyperactivity     Past Surgical History  Procedure Date  . Abdominal hysterectomy   . Ventriculoperitoneal shunt 09/02/2011    Procedure: SHUNT INSERTION VENTRICULAR-PERITONEAL;  Surgeon: Maeola Harman, MD;  Location: [facility];  Service: Neurosurgery;  Laterality: Right;  . Aneurysm coiling 08/16/11    History reviewed. No pertinent family history.  History  Substance Use Topics  . Smoking status: Never Smoker   . Smokeless tobacco: Not on file  . Alcohol Use: No    OB History    Grav Para Term Preterm Abortions TAB SAB Ect Mult Living                  Review of Systems  Constitutional: Positive for fever.  HENT: Negative for neck pain.   Gastrointestinal: Positive for  vomiting and diarrhea.  Genitourinary: Negative for dysuria.  Musculoskeletal: Positive for arthralgias.  Neurological: Negative for headaches.  All other systems reviewed and are negative.    Allergies  Lactose intolerance (gi) and Penicillins  Home Medications   Current Outpatient Rx  Name  Route  Sig  Dispense  Refill  . ALLOPURINOL 150 MG HALF TABLET   Oral   Take 150 mg by mouth daily.         Marland Kitchen BISACODYL 10 MG RE SUPP   Rectal   Place 10 mg rectally daily as needed. For constipation.         Marland Kitchen CITALOPRAM HYDROBROMIDE 10 MG PO TABS   Oral   Take 10 mg by mouth at bedtime.         Marland Kitchen CLONIDINE HCL 0.2 MG PO TABS   Oral   Take 1 tablet (0.2 mg total) by mouth every 2 (two) hours as needed (systolic BP > 160 or diastolic > 110).         . DIPHENHYDRAMINE HCL 25 MG PO TABS   Oral   Take 25 mg by mouth every 6 (six) hours as needed.         Marland Kitchen DOCUSATE SODIUM 100 MG PO CAPS   Oral   Take 100 mg by mouth 2 (two) times daily.         Marland Kitchen  ENOXAPARIN SODIUM 100 MG/ML Craig SOLN   Subcutaneous   Inject 1 mL (100 mg total) into the skin every 12 (twelve) hours.   0 Syringe      . FERROUS SULFATE 325 (65 FE) MG PO TABS   Oral   Take 325 mg by mouth daily with breakfast.         . INSULIN GLARGINE 100 UNIT/ML Nellie SOLN   Subcutaneous   Inject 4 Units into the skin at bedtime.          Marland Kitchen LABETALOL HCL 200 MG PO TABS   Oral   Take 200 mg by mouth 3 (three) times daily.         Marland Kitchen LEVETIRACETAM 500 MG PO TABS   Oral   Take 1 tablet (500 mg total) by mouth 2 (two) times daily.         Marland Kitchen LISINOPRIL 20 MG PO TABS   Oral   Take 1 tablet (20 mg total) by mouth daily.         . METHYLPHENIDATE HCL 10 MG PO TABS   Oral   Take 10 mg by mouth 2 (two) times daily.         Marland Kitchen MIRTAZAPINE 7.5 MG PO TABS   Oral   Take 7.5 mg by mouth every evening.         Marland Kitchen PANTOPRAZOLE SODIUM 40 MG PO TBEC   Oral   Take 40 mg by mouth daily.         Marland Kitchen POTASSIUM  CHLORIDE CRYS ER 20 MEQ PO TBCR   Oral   Take 40 mEq by mouth daily.         . WARFARIN SODIUM 5 MG PO TABS   Oral   Take 5 mg by mouth at bedtime.           BP 100/69  Pulse 91  Temp 98.1 F (36.7 C) (Oral)  Resp 20  SpO2 97%  Physical Exam  Nursing note and vitals reviewed. Constitutional: She is oriented to person, place, and time. She appears well-developed and well-nourished. No distress.  HENT:  Head: Normocephalic and atraumatic.  Right Ear: External ear normal.  Left Ear: External ear normal.  Eyes: Conjunctivae normal are normal. Right eye exhibits no discharge. Left eye exhibits no discharge. No scleral icterus.  Neck: Neck supple. No tracheal deviation present.  Cardiovascular: Normal rate, regular rhythm and intact distal pulses.   Pulmonary/Chest: Effort normal and breath sounds normal. No stridor. No respiratory distress. She has no wheezes. She has no rales.  Abdominal: Soft. Bowel sounds are normal. She exhibits no distension. There is no tenderness. There is no rebound and no guarding.  Musculoskeletal: She exhibits no edema and no tenderness.  Neurological: She is alert and oriented to person, place, and time. She has normal strength. No sensory deficit. Cranial nerve deficit:  no gross defecits noted. She exhibits normal muscle tone. She displays no seizure activity. Coordination normal.  Skin: Skin is warm and dry. No rash noted.  Psychiatric: She has a normal mood and affect.    ED Course  Procedures (including critical care time)  Rate:88  Rhythm: normal sinus rhythm  QRS Axis: normal  Intervals: normal  ST/T Wave abnormalities: normal  Conduction Disutrbances:none  Narrative Interpretation: nl  Old EKG Reviewed: t wave abnormality on prior ECG not present on current ECG Labs Reviewed  COMPREHENSIVE METABOLIC PANEL - Abnormal; Notable for the following:    Potassium 6.6 (*)  Glucose, Bld 130 (*)     BUN 29 (*)     Creatinine, Ser 4.13 (*)      Total Protein 8.5 (*)     GFR calc non Af Amer 11 (*)     GFR calc Af Amer 12 (*)     All other components within normal limits  CBC WITH DIFFERENTIAL - Abnormal; Notable for the following:    RDW 16.7 (*)     All other components within normal limits  PROTIME-INR - Abnormal; Notable for the following:    Prothrombin Time 25.2 (*)     INR 2.42 (*)     All other components within normal limits  LIPASE, BLOOD  URINALYSIS, ROUTINE W REFLEX MICROSCOPIC   Ct Head Wo Contrast  02/09/2012  *RADIOLOGY REPORT*  Clinical Data: Nausea, emesis, diarrhea  CT HEAD WITHOUT CONTRAST  Technique:  Contiguous axial images were obtained from the base of the skull through the vertex without contrast.  Comparison: 10/08/2011; 09/21/2011  Findings:  Redemonstrated prior ACA distribution infarcts involving the bilateral frontal lobes, left greater than right.  Unchanged lacunar infarcts within the anterior horn of the left lateral ventricle and left basal ganglia.  Grossly unchanged rather extensive periventricular hypodensity compatible microvascular ischemic disease. Given extensive background parenchymal abnormalities, there is no CT evidence of acute large territory infarct.  Right frontal approach ventriculostomy catheter again terminates regional to the third ventricle.  The ventricles and basilar cisterns are unchanged in size configuration.  No intraparenchymal or extra-axial mass or hemorrhage.  No midline shift.  Embolization coils are again seen regional to the effected location of the ACAs.  Limited visualization of paranasal sinuses and mastoid air cells are normal.  Regional soft tissues are normal. No displaced calvarial fracture.  IMPRESSION: 1.  No acute findings. 2.  Grossly unchanged sequela of prior ACA distribution infarct. 3.  Unchanged right frontal approach ventriculostomy catheter with unchanged size of the ventricles and basilar cisterns.   Original Report Authenticated By: Tacey Ruiz, MD     Dg Abd Acute W/chest  02/09/2012  *RADIOLOGY REPORT*  Clinical Data: Nausea, emesis, history of seizure disorder  ACUTE ABDOMEN SERIES (ABDOMEN 2 VIEW & CHEST 1 VIEW)  Comparison: Chest radiograph - 10/16/2011; 10/08/2011; CT abdomen pelvis - 10/13/2011; chest CT - 10/14/2011  Findings:  Grossly unchanged cardiac silhouette and mediastinal contours with mild tortuosity ectasia of the thoracic aorta. Possible developing air space opacity within the medial aspect the right upper lung. No pleural effusion or pneumothorax.  Moderate gaseous distension of the colon without definite evidence of obstruction.  No pneumoperitoneum, pneumatosis or portal venous gas.  Ventriculoperitoneal tubing courses along the right mid clavicular line is coiled within the left lower abdominal quadrant.  No definite evidence of catheter kink or fracture/discontinuity.  No definite abnormal intra-abdominal calcifications.  No acute osseous abnormalities.  IMPRESSION: 1.  Possible developing air space opacity in the medial aspect of right upper lung for infection.  Further evaluation with a PA and lateral chest radiograph may be obtained as clinically indicated.  2. Moderate gas distension of the colon without evidence of obstruction. 3.  Ventriculoperitoneal catheter tubing without discrete evidence of kink or fracture.   Original Report Authenticated By: Tacey Ruiz, MD     1. Dehydration   2. Acute renal failure   3. Hyperkalemia   4. Vomiting and diarrhea     MDM  Pt has new onset renal failure.    Likely related to prerenal  azotemia although cannot exclude possible medication induced (ie lisinopril).  Potassium is elevated although no EKG changes.  Will plan on admission for further treatment.  No sign of acute abdominal process on CT.  Doubt PNA.  Pt denies cough or other symptoms       Celene Kras, MD 02/09/12 2221

## 2012-02-10 ENCOUNTER — Inpatient Hospital Stay (HOSPITAL_COMMUNITY): Payer: Medicaid Other

## 2012-02-10 ENCOUNTER — Telehealth: Payer: Self-pay | Admitting: *Deleted

## 2012-02-10 DIAGNOSIS — E162 Hypoglycemia, unspecified: Secondary | ICD-10-CM | POA: Diagnosis present

## 2012-02-10 DIAGNOSIS — A419 Sepsis, unspecified organism: Secondary | ICD-10-CM | POA: Diagnosis present

## 2012-02-10 DIAGNOSIS — J9600 Acute respiratory failure, unspecified whether with hypoxia or hypercapnia: Secondary | ICD-10-CM

## 2012-02-10 DIAGNOSIS — G9340 Encephalopathy, unspecified: Secondary | ICD-10-CM

## 2012-02-10 DIAGNOSIS — I959 Hypotension, unspecified: Secondary | ICD-10-CM

## 2012-02-10 DIAGNOSIS — R6510 Systemic inflammatory response syndrome (SIRS) of non-infectious origin without acute organ dysfunction: Secondary | ICD-10-CM

## 2012-02-10 DIAGNOSIS — T68XXXA Hypothermia, initial encounter: Secondary | ICD-10-CM

## 2012-02-10 DIAGNOSIS — J690 Pneumonitis due to inhalation of food and vomit: Secondary | ICD-10-CM

## 2012-02-10 LAB — CBC WITH DIFFERENTIAL/PLATELET
Basophils Absolute: 0 10*3/uL (ref 0.0–0.1)
Eosinophils Relative: 1 % (ref 0–5)
HCT: 33.6 % — ABNORMAL LOW (ref 36.0–46.0)
Lymphocytes Relative: 25 % (ref 12–46)
MCV: 84.6 fL (ref 78.0–100.0)
Monocytes Absolute: 0.4 10*3/uL (ref 0.1–1.0)
RDW: 16.8 % — ABNORMAL HIGH (ref 11.5–15.5)
WBC: 7.5 10*3/uL (ref 4.0–10.5)

## 2012-02-10 LAB — URINALYSIS, ROUTINE W REFLEX MICROSCOPIC
Ketones, ur: 15 mg/dL — AB
Nitrite: NEGATIVE
Specific Gravity, Urine: 1.023 (ref 1.005–1.030)
pH: 5 (ref 5.0–8.0)

## 2012-02-10 LAB — COMPREHENSIVE METABOLIC PANEL
AST: 11 U/L (ref 0–37)
CO2: 23 mEq/L (ref 19–32)
Calcium: 8.4 mg/dL (ref 8.4–10.5)
Creatinine, Ser: 3.97 mg/dL — ABNORMAL HIGH (ref 0.50–1.10)
GFR calc Af Amer: 13 mL/min — ABNORMAL LOW (ref >90)
GFR calc non Af Amer: 11 mL/min — ABNORMAL LOW (ref >90)
Glucose, Bld: 111 mg/dL — ABNORMAL HIGH (ref 70–99)

## 2012-02-10 LAB — URINE MICROSCOPIC-ADD ON

## 2012-02-10 LAB — CBC
HCT: 31 % — ABNORMAL LOW (ref 36.0–46.0)
Hemoglobin: 9.7 g/dL — ABNORMAL LOW (ref 12.0–15.0)
MCHC: 31.3 g/dL (ref 30.0–36.0)
MCV: 84.7 fL (ref 78.0–100.0)
RDW: 16.9 % — ABNORMAL HIGH (ref 11.5–15.5)

## 2012-02-10 LAB — BASIC METABOLIC PANEL
BUN: 28 mg/dL — ABNORMAL HIGH (ref 6–23)
Creatinine, Ser: 3.55 mg/dL — ABNORMAL HIGH (ref 0.50–1.10)
GFR calc non Af Amer: 13 mL/min — ABNORMAL LOW (ref >90)
Glucose, Bld: 124 mg/dL — ABNORMAL HIGH (ref 70–99)
Potassium: 5.2 mEq/L — ABNORMAL HIGH (ref 3.5–5.1)

## 2012-02-10 LAB — GLUCOSE, CAPILLARY
Glucose-Capillary: 65 mg/dL — ABNORMAL LOW (ref 70–99)
Glucose-Capillary: 175 mg/dL — ABNORMAL HIGH (ref 70–99)
Glucose-Capillary: 135 mg/dL — ABNORMAL HIGH (ref 70–99)
Glucose-Capillary: 140 mg/dL — ABNORMAL HIGH (ref 70–99)
Glucose-Capillary: 127 mg/dL — ABNORMAL HIGH (ref 70–99)
Glucose-Capillary: 111 mg/dL — ABNORMAL HIGH (ref 70–99)

## 2012-02-10 LAB — PROCALCITONIN: Procalcitonin: 0.48 ng/mL

## 2012-02-10 LAB — AMYLASE: Amylase: 105 U/L (ref 0–105)

## 2012-02-10 LAB — TROPONIN I: Troponin I: <0.3 ng/mL (ref <0.30)

## 2012-02-10 LAB — LACTIC ACID, PLASMA: Lactic Acid, Venous: 0.8 mmol/L (ref 0.5–2.2)

## 2012-02-10 MED ORDER — OSELTAMIVIR PHOSPHATE 75 MG PO CAPS
75.0000 mg | ORAL_CAPSULE | Freq: Every day | ORAL | Status: DC
  Filled 2012-02-10: qty 1

## 2012-02-10 MED ORDER — SODIUM CHLORIDE 0.9 % IV SOLN
1500.0000 mg | Freq: Once | INTRAVENOUS | Status: AC
  Administered 2012-02-10: 1500 mg via INTRAVENOUS
  Filled 2012-02-10: qty 1500

## 2012-02-10 MED ORDER — VANCOMYCIN HCL IN DEXTROSE 1-5 GM/200ML-% IV SOLN: 1000.0000 mg | INTRAVENOUS | Status: DC

## 2012-02-10 MED ORDER — SODIUM POLYSTYRENE SULFONATE 15 GM/60ML PO SUSP
30.0000 g | Freq: Once | ORAL | Status: AC
  Administered 2012-02-10: 30 g via ORAL
  Filled 2012-02-10: qty 120

## 2012-02-10 MED ORDER — SODIUM CHLORIDE 0.9 % IV BOLUS (SEPSIS): 1000.0000 mL | Freq: Once | INTRAVENOUS | Status: DC

## 2012-02-10 MED ORDER — DEXTROSE 5 % IV SOLN
500.0000 mg | Freq: Three times a day (TID) | INTRAVENOUS | Status: DC
  Administered 2012-02-10 – 2012-02-11 (×4): 500 mg via INTRAVENOUS
  Filled 2012-02-10 (×6): qty 0.5

## 2012-02-10 MED ORDER — SODIUM CHLORIDE 0.9 % IV SOLN
INTRAVENOUS | Status: DC
  Administered 2012-02-10: 04:00:00 via INTRAVENOUS

## 2012-02-10 MED ORDER — DEXTROSE-NACL 5-0.45 % IV SOLN
INTRAVENOUS | Status: DC
  Administered 2012-02-10: 05:00:00 via INTRAVENOUS

## 2012-02-10 MED ORDER — DEXTROSE 50 % IV SOLN
INTRAVENOUS | Status: AC
  Administered 2012-02-10: 50 mL via INTRAVENOUS
  Filled 2012-02-10: qty 50

## 2012-02-10 MED ORDER — DEXTROSE 5 % IV SOLN
1.0000 g | INTRAVENOUS | Status: DC
  Filled 2012-02-10: qty 1

## 2012-02-10 MED ORDER — SODIUM CHLORIDE 0.9 % IV BOLUS (SEPSIS)
1000.0000 mL | Freq: Once | INTRAVENOUS | Status: AC
  Administered 2012-02-10: 1000 mL via INTRAVENOUS

## 2012-02-10 MED ORDER — SODIUM CHLORIDE 0.9 % IV SOLN
500.0000 mg | Freq: Two times a day (BID) | INTRAVENOUS | Status: DC
  Administered 2012-02-10 (×2): 500 mg via INTRAVENOUS
  Filled 2012-02-10 (×4): qty 5

## 2012-02-10 MED ORDER — METRONIDAZOLE IN NACL 5-0.79 MG/ML-% IV SOLN
500.0000 mg | Freq: Three times a day (TID) | INTRAVENOUS | Status: DC
  Administered 2012-02-10 – 2012-02-11 (×4): 500 mg via INTRAVENOUS
  Filled 2012-02-10 (×7): qty 100

## 2012-02-10 MED ORDER — SODIUM CHLORIDE 0.9 % IV SOLN
INTRAVENOUS | Status: DC
  Administered 2012-02-10: 125 mL/h via INTRAVENOUS
  Administered 2012-02-10 – 2012-02-12 (×4): via INTRAVENOUS

## 2012-02-10 MED ORDER — GLUCOSE 40 % PO GEL
ORAL | Status: AC
  Filled 2012-02-10: qty 1

## 2012-02-10 MED ORDER — OSELTAMIVIR PHOSPHATE 75 MG PO CAPS
75.0000 mg | ORAL_CAPSULE | Freq: Two times a day (BID) | ORAL | Status: DC
  Administered 2012-02-10: 75 mg via ORAL
  Filled 2012-02-10 (×2): qty 1

## 2012-02-10 NOTE — Progress Notes (Signed)
Patient and family educated on reason for isolation multiple times. Husband continues to not wear mask or gown.

## 2012-02-10 NOTE — Care Management Note (Signed)
    Page 1 of 1   02/10/2012     8:58:33 AM   CARE MANAGEMENT NOTE 02/10/2012  Patient:  [facility]   Account Number:  [id number]  Date Initiated:  02/10/2012  Documentation initiated by:  Junius Creamer  Subjective/Objective Assessment:   adm w kidney failure, fever     Action/Plan:   lives w husband   Anticipated DC Date:     Anticipated DC Plan:        DC Planning Services  CM consult      Choice offered to / List presented to:             Status of service:   Medicare Important Message given?   (If response is "NO", the following Medicare IM given date fields will be blank) Date Medicare IM given:   Date Additional Medicare IM given:    Discharge Disposition:    Per UR Regulation:  Reviewed for med. necessity/level of care/duration of stay  If discussed at Long Length of Stay Meetings, dates discussed:    Comments:  2/7 0900 debbie Deontray [place] rn,bsn

## 2012-02-10 NOTE — Progress Notes (Signed)
ANTIBIOTIC AND ANTICOAGULATION CONSULT NOTE - FOLLOW UP  Pharmacy Consult for Vancomycin, aztreonam and Warfarin Indication: empiric abx coverage and h/o PE  Allergies  Allergen Reactions  . Lactose Intolerance (Gi) Other (See Comments)    G.I. Upset  . Penicillins     Doesn't remember what happens    Patient Measurements: Height: 5\' 5"  (165.1 cm) Weight: 180 lb 12.4 oz (82 kg) IBW/kg (Calculated) : 57   Vital Signs: Temp: 97.4 F (36.3 C) (02/07 0755) Temp src: Axillary (02/07 0755) BP: 101/56 mmHg (02/07 0755) Pulse Rate: 56  (02/07 0700) Intake/Output from previous day: 02/06 0701 - 02/07 0700 In: 1752.5 [I.V.:102.5; IV Piggyback:1650] Out: -  Intake/Output from this shift: Total I/O In: 345 [I.V.:300; Other:45] Out: -   Labs:  Basename 02/10/12 0427 02/10/12 0257 02/09/12 1915  WBC -- 7.5 7.9  HGB -- 10.6* 13.1  PLT -- 132* 182  LABCREA 184.47 -- --  CREATININE -- 3.97* 4.13*   Estimated Creatinine Clearance: 15.3 ml/min (by C-G formula based on Cr of 3.97). No results found for this basename: VANCOTROUGH:2,VANCOPEAK:2,VANCORANDOM:2,GENTTROUGH:2,GENTPEAK:2,GENTRANDOM:2,TOBRATROUGH:2,TOBRAPEAK:2,TOBRARND:2,AMIKACINPEAK:2,AMIKACINTROU:2,AMIKACIN:2, in the last 72 hours   Microbiology: Recent Results (from the past 720 hour(s))  MRSA PCR SCREENING     Status: Normal   Collection Time   02/10/12  3:22 AM      Component Value Range Status Comment   MRSA by PCR NEGATIVE  NEGATIVE Final     Anti-infectives     Start     Dose/Rate Route Frequency Ordered Stop   02/10/12 0500   aztreonam (AZACTAM) 500 mg in dextrose 5 % 50 mL IVPB        500 mg 100 mL/hr over 30 Minutes Intravenous 3 times per day 02/10/12 0418     02/10/12 0430   oseltamivir (TAMIFLU) capsule 75 mg     Comments: Tamiflu 75 mg once daily for CrCl < 30 mL/min      75 mg Oral 2 times daily 02/10/12 0351 02/15/12 0959   02/10/12 0400   metroNIDAZOLE (FLAGYL) IVPB 500 mg        500 mg 100 mL/hr  over 60 Minutes Intravenous 3 times per day 02/10/12 0304     02/10/12 0400   oseltamivir (TAMIFLU) capsule 75 mg  Status:  Discontinued     Comments: Tamiflu 75 mg once daily for CrCl < 30 mL/min      75 mg Oral Daily 02/10/12 0304 02/10/12 0351   02/10/12 0400   ceFEPIme (MAXIPIME) 1 g in dextrose 5 % 50 mL IVPB  Status:  Discontinued        1 g 100 mL/hr over 30 Minutes Intravenous Every 24 hours 02/10/12 0318 02/10/12 0349   02/10/12 0330   vancomycin (VANCOCIN) 1,500 mg in sodium chloride 0.9 % 500 mL IVPB        1,500 mg 250 mL/hr over 120 Minutes Intravenous  Once 02/10/12 0313 02/10/12 0708          Assessment: 61 yof admitted with vomiting, diarrhea and ARF on empiric abx and coumadin per pharmacy. In the setting of acute illness and abx coverage INR up to 3.34. Currently patient is afebrile and wbc WNL. 1.5g Vancomycin given today.   Goal of Therapy:  Vancomycin trough level 15-20 mcg/ml INR 2-3  Plan:  1. D/c home Coumadin regimen, no coumadin today in setting of acute illness, poor diet, and abx on board 2. Continue aztreonam 500mg  IV q8h 3. Vancomycin 1000mg  IV q48 hours to start 2/9  and f/u renal function  Thank you,  Brett Fairy, PharmD, BCPS 02/10/2012 11:03 AM

## 2012-02-10 NOTE — Consult Note (Signed)
PULMONARY  / CRITICAL CARE MEDICINE  Name: [name] MRN: [id number] DOB: 31-Mar-1950    ADMISSION DATE:  02/09/2012 CONSULTATION DATE:  02/10/2012  REFERRING MD :  TRH  CHIEF COMPLAINT:  Hypotension  BRIEF PATIENT DESCRIPTION: [name] (after recent [facility]) admitted 2/6 with fever, nausea, vomiting, diarrhea and acute renal failure.  Transferred to SDU 2/7 with hypotension, hypoglycemia and hypothermia.  SIGNIFICANT EVENTS / STUDIES:  2/6  Admitted with n/v/d and acute renal failure 2/7  Transferred to SDU with hypoglycemia, hypothermia and hypotension  LINES / TUBES: Foley 2/7 >>>  CULTURES: 2/7  Blood >>> 2/7  Urine >>> 2/7  C.dif PCR >>> 2/7  Flu PCR >>> 2/7  PCT >>>  ANTIBIOTICS: Aztreonam 2/7 >>> Vancomycin 2/7 >>> Flagyl 2/7 >>> Tamiflu 2/7 >>>  The patient is encephalopathic and unable to provide history, which was obtained for available medical records.  HISTORY OF PRESENT ILLNESS:   [name] (after recent [facility]) admitted 2/6 with fever, nausea, vomiting, diarrhea and acute renal failure.  Transferred to SDU 2/7 with hypotension, hypoglycemia and hypothermia.  PAST MEDICAL HISTORY :  Past Medical History  Diagnosis Date  . Hypertension   . Stroke   . PE (pulmonary embolism)   . Seizure disorder   . SAH (subarachnoid hemorrhage) 08/16/11  . Depression, reactive   . Diabetes mellitus   . Esophageal reflux   . Heart disease   . Hypopotassemia   . Attention deficit disorder of childhood with hyperactivity    Past Surgical History  Procedure Date  . Abdominal hysterectomy   . Ventriculoperitoneal shunt 09/02/2011    Procedure: SHUNT INSERTION VENTRICULAR-PERITONEAL;  Surgeon: Maeola Harman, MD;  Location: [facility];  Service: Neurosurgery;  Laterality: Right;  . Aneurysm coiling 08/16/11   Prior to Admission medications   Medication Sig Start Date End Date Taking? Authorizing Provider  allopurinol (ZYLOPRIM) 150 mg TABS Take 150 mg by  mouth daily.   Yes Historical Provider, MD  bisacodyl (DULCOLAX) 10 MG suppository Place 10 mg rectally daily as needed. For constipation.   Yes Historical Provider, MD  citalopram (CELEXA) 20 MG tablet Take 20 mg by mouth daily.   Yes Historical Provider, MD  cloNIDine (CATAPRES) 0.2 MG tablet Take 1 tablet (0.2 mg total) by mouth every 2 (two) hours as needed (systolic BP > 160 or diastolic > 110). 10/18/11  Yes Joseph Art, DO  docusate sodium (COLACE) 100 MG capsule Take 100 mg by mouth 2 (two) times daily.   Yes Historical Provider, MD  ferrous sulfate 325 (65 FE) MG tablet Take 325 mg by mouth 3 (three) times daily with meals.    Yes Historical Provider, MD  insulin aspart (NOVOLOG) 100 UNIT/ML injection Inject 0-12 Units into the skin 4 (four) times daily -  before meals and at bedtime. Sliding scale with meals and bedtime coverage   Yes Historical Provider, MD  insulin glargine (LANTUS) 100 UNIT/ML injection Inject 4 Units into the skin at bedtime.    Yes Historical Provider, MD  labetalol (NORMODYNE) 200 MG tablet Take 200 mg by mouth 3 (three) times daily.   Yes Historical Provider, MD  levETIRAcetam (KEPPRA) 500 MG tablet Take 1 tablet (500 mg total) by mouth 2 (two) times daily. 10/18/11  Yes Joseph Art, DO  lisinopril (PRINIVIL,ZESTRIL) 20 MG tablet Take 1 tablet (20 mg total) by mouth daily. 10/18/11  Yes Joseph Art, DO  methylphenidate (RITALIN) 5 MG tablet Take  5 mg by mouth 2 (two) times daily. Morning and noon   Yes Historical Provider, MD  mirtazapine (REMERON) 15 MG tablet Take 7.5 mg by mouth at bedtime.   Yes Historical Provider, MD  pantoprazole (PROTONIX) 40 MG tablet Take 40 mg by mouth daily.   Yes Historical Provider, MD  potassium chloride SA (K-DUR,KLOR-CON) 20 MEQ tablet Take 40 mEq by mouth daily.   Yes Historical Provider, MD  warfarin (COUMADIN) 2.5 MG tablet Take 2.5 mg by mouth daily. Take with 3mg  tablet for total dose of 5.5mg    Yes Historical Provider,  MD  warfarin (COUMADIN) 3 MG tablet Take 3 mg by mouth daily. Take with 2.5mg  tablet for total of 5.5mg    Yes Historical Provider, MD   Allergies  Allergen Reactions  . Lactose Intolerance (Gi) Other (See Comments)    G.I. Upset  . Penicillins     Doesn't remember what happens   FAMILY HISTORY:  History reviewed. No pertinent family history.  SOCIAL HISTORY:  reports that she has never smoked. She does not have any smokeless tobacco history on file. She reports that she does not drink alcohol or use illicit drugs.  REVIEW OF SYSTEMS:  Unable to provide.  INTERVAL HISTORY:  VITAL SIGNS: Temp:  [97.8 F (36.6 C)-98.1 F (36.7 C)] 97.8 F (36.6 C) (02/06 2355) Pulse Rate:  [57-91] 57  (02/07 0323) Resp:  [16-20] 16  (02/07 0323) BP: (75-108)/(47-69) 98/56 mmHg (02/07 0323) SpO2:  [96 %-100 %] 96 % (02/07 0323) Weight:  [80.468 kg (177 lb 6.4 oz)] 80.468 kg (177 lb 6.4 oz) (02/06 2355)  HEMODYNAMICS:    VENTILATOR SETTINGS:    INTAKE / OUTPUT: Intake/Output    None     PHYSICAL EXAMINATION: General:  Appears chronically ill, but not in distress Neuro:  Encephalopathic, somnolent, arouses to stimulation, responds with simple yes and no answers HEENT:  PERRL Cardiovascular:  Bradycardic, regular, no murmurs Lungs:  Bilateral diminished air entry, no w/r/r Abdomen:  Soft, moderate diffuse tenderness, bowel sounds present Musculoskeletal:  Moves all extremities, no edema Skin:  Intact  LABS:  Lab 02/09/12 2329 02/09/12 2234 02/09/12 1915  HGB -- -- 13.1  WBC -- -- 7.9  PLT -- -- 182  NA -- -- 136  K 5.1 6.6* --  CL -- -- 97  CO2 -- -- 26  GLUCOSE -- -- 130*  BUN -- -- 29*  CREATININE -- -- 4.13*  CALCIUM -- -- 10.1  MG -- -- --  PHOS -- -- --  AST -- -- 15  ALT -- -- 8  ALKPHOS -- -- 71  BILITOT -- -- 0.4  PROT -- -- 8.5*  ALBUMIN -- -- 3.7  APTT -- -- --  INR -- -- 2.42*  LATICACIDVEN -- -- --  TROPONINI -- -- --  PROCALCITON -- -- --  PROBNP --  -- --  O2SATVEN -- -- --  PHART -- -- --  PCO2ART -- -- --  PO2ART -- -- --    Lab 02/10/12 0248 02/10/12 0226 02/09/12 2352  GLUCAP 175* 65* 91   CXR:  2/7 >>> No overt airspace disease  ASSESSMENT / PLAN:  PULMONARY A:  No active issues. P:   Gaol SpO2>92 Supplemental oxygen PRN  CARDIOVASCULAR A: Hypotension in setting ov severe dehydration and multiple antihypertensives.  Less likely SIRS / sepsis.  P:  Goal MAP>60 Hold Labetalol, Clonidine, Lisinopril Will place CVL (poor IV access) No indication for vasopressors at this time Trend  troponin and lactate  RENAL A:  Acute renal failure in setting of severe dehydration.  Hyperkalemia, resolved. P:   Goal CVP>10 Trend BMP NS 1000 x 1 D5 1/2NS @ 150 Hold K supplements  GASTROINTESTINAL A:  Nausea / vomiting / diarrhea, etiology is not clear, abdominal exam reassuring. P:   NPO Amylase, lipase, LFTs May need CT abdomen  / pelvis, will defer for now C.diff PCR GI Px is not indicated  HEMATOLOGIC A:  Anticoagulation for PE. P:  Trend CBC, INR Coumadin per Pharmacy DVT PX is not indicated  INFECTIOUS  A:  No clear source of infection.  Fever.  PCN allergy. P:   Cultures and antibiotics as above PCT Flu PCR Droplet isolation   ENDOCRINE  A:  DM2.  Hypoglycemia.  P:   CBG Hold Insulin Cortisol level  NEUROLOGIC A:  Recent SAH, s/p ventriculoperitoneal shunt.  Acute encephalopathy.  Depression. P:   Change Keppra to IV Hold Celexa, Ritalin, Remeron  TODAY'S SUMMARY: [name] (after recent [facility]) admitted 2/6 with fever, nausea, vomiting, diarrhea and acute renal failure.  Transferred to SDU 2/7 with hypotension, hypoglycemia and hypothermia.  Tonight:  IFV.  Empirical antibiotics.  CVL.  I have personally obtained a history, examined the patient, evaluated laboratory and imaging results, formulated the assessment and plan and placed orders.  CRITICAL CARE:  The patient is critically ill  with multiple organ systems failure and requires high complexity decision making for assessment and support, frequent evaluation and titration of therapies, application of advanced monitoring technologies and extensive interpretation of multiple databases. Critical Care Time devoted to patient care services described in this note is 45 minutes.   Lonia Farber, MD  Pulmonary and Critical Care Medicine [facility] Pager: [phone]  02/10/2012, 3:57 AM

## 2012-02-10 NOTE — Telephone Encounter (Signed)
Has been admitted to [facility] (last night).

## 2012-02-10 NOTE — Progress Notes (Addendum)
ANTIBIOTIC CONSULT NOTE - INITIAL  Pharmacy Consult for vancomycin, aztreonam Indication: Empiric  Allergies  Allergen Reactions  . Lactose Intolerance (Gi) Other (See Comments)    G.I. Upset  . Penicillins     Doesn't remember what happens    Patient Measurements: Height: 5\' 3"  (160 cm) Weight: 177 lb 6.4 oz (80.468 kg) (scale A) IBW/kg (Calculated) : 52.4   Vital Signs: Temp: 97.8 F (36.6 C) (02/06 2355) Temp src: Oral (02/06 2355) BP: 86/47 mmHg (02/07 0250) Pulse Rate: 81  (02/06 2355) Intake/Output from previous day:   Intake/Output from this shift:    Labs:  [facility] 02/09/12 1915  WBC 7.9  HGB 13.1  PLT 182  LABCREA --  CREATININE 4.13*   Estimated Creatinine Clearance: 14 ml/min (by C-G formula based on Cr of 4.13). No results found for this basename: VANCOTROUGH:2,VANCOPEAK:2,VANCORANDOM:2,GENTTROUGH:2,GENTPEAK:2,GENTRANDOM:2,TOBRATROUGH:2,TOBRAPEAK:2,TOBRARND:2,AMIKACINPEAK:2,AMIKACINTROU:2,AMIKACIN:2, in the last 72 hours   Microbiology: No results found for this or any previous visit (from the past 720 hour(s)).  Medical History: Past Medical History  Diagnosis Date  . Hypertension   . Stroke   . PE (pulmonary embolism)   . Seizure disorder   . SAH (subarachnoid hemorrhage) 08/16/11  . Depression, reactive   . Diabetes mellitus   . Esophageal reflux   . Heart disease   . Hypopotassemia   . Attention deficit disorder of childhood with hyperactivity     Medications:  Scheduled:    . [COMPLETED] sodium chloride  1,000 mL Intravenous Once  . [COMPLETED] albuterol  10 mg Nebulization Once  . citalopram  20 mg Oral Daily  . dextrose      . [COMPLETED] dextrose  1 ampule Intravenous Once  . ferrous sulfate  325 mg Oral TID WC  . insulin aspart  0-15 Units Subcutaneous Q4H  . [COMPLETED] insulin aspart  10 Units Intravenous STAT  . levETIRAcetam  500 mg Oral BID  . metronidazole  500 mg Intravenous Q8H  . mirtazapine  7.5 mg Oral QHS  .  [COMPLETED] ondansetron  4 mg Intravenous Once  . oseltamivir  75 mg Oral Daily  . [COMPLETED] sodium chloride  1,000 mL Intravenous Once  . sodium chloride  1,000 mL Intravenous Once  . sodium chloride  3 mL Intravenous Q12H  . warfarin  5.5 mg Oral q1800  . Warfarin - Pharmacist Dosing Inpatient   Does not apply q1800  . [DISCONTINUED] docusate sodium  100 mg Oral BID  . [DISCONTINUED] insulin aspart  0-12 Units Subcutaneous TID AC & HS  . [DISCONTINUED] insulin glargine  4 Units Subcutaneous QHS  . [DISCONTINUED] labetalol  200 mg Oral TID  . [DISCONTINUED] pantoprazole  40 mg Oral Daily   Assessment: 62 yo female admitted with vomiting and diarrhea. Pharmacy now consulted to manage vancomycin and cefepime for empiric therapy.  Goal of Therapy:  Vancomycin trough 10-20 mcg/mL  Plan:  1. Vancomycin 1.5gm IV x 1.  2. Follow-up renal function for maintenance vancomycin dosing.  3. Cefepime 1gm IV Q24H.   Emeline Gins 02/10/2012,3:07 AM  Addendum: Cefepime discontinued and pharmacy now to manage aztreonam.  Aztreonam 500mg  IV Q8H.   Lorre Munroe, PharmD 02/10/12, 04:18 AM.

## 2012-02-10 NOTE — Progress Notes (Signed)
PULMONARY  / CRITICAL CARE MEDICINE  Name: [name] MRN: [id number] DOB: 1950-12-26    ADMISSION DATE:  02/09/2012 CONSULTATION DATE:  02/10/2012  REFERRING MD :  TRH  CHIEF COMPLAINT:  Hypotension  BRIEF PATIENT DESCRIPTION: [name] (after recent SAH) admitted 2/6 with fever, nausea, vomiting, diarrhea and acute renal failure.  Transferred to SDU 2/7 with hypotension, hypoglycemia and hypothermia.  SIGNIFICANT EVENTS / STUDIES:  2/6  Admitted with n/v/d and acute renal failure 2/7  Transferred to SDU with hypoglycemia, hypothermia and hypotension  LINES / TUBES: Foley 2/6 >>> L IJ TLC 2/6>>>  CULTURES: 2/7  Blood >>> 2/7  Urine >>> 2/7  C.dif PCR >>> 2/7  Flu PCR >>> 2/7  PCT >>>  ANTIBIOTICS: Aztreonam 2/7 >>> Vancomycin 2/7 >>> Flagyl 2/7 >>> Tamiflu 2/7 >>>  HISTORY OF PRESENT ILLNESS:   [name] (after recent [facility]) admitted 2/6 with fever, nausea, vomiting, diarrhea and acute renal failure.  Transferred to SDU 2/7 with hypotension, hypoglycemia and hypothermia.  VITAL SIGNS: Temp:  [93.8 F (34.3 C)-98.1 F (36.7 C)] 97.4 F (36.3 C) (02/07 0755) Pulse Rate:  [49-91] 56  (02/07 0700) Resp:  [12-20] 12  (02/07 0700) BP: (75-137)/(47-72) 101/56 mmHg (02/07 0755) SpO2:  [96 %-100 %] 100 % (02/07 0755) Weight:  [80.468 kg (177 lb 6.4 oz)-82 kg (180 lb 12.4 oz)] 82 kg (180 lb 12.4 oz) (02/07 0600)  HEMODYNAMICS: CVP:  [7 mmHg-8 mmHg] 8 mmHg  VENTILATOR SETTINGS:    INTAKE / OUTPUT: Intake/Output      02/06 0701 - 02/07 0700 02/07 0701 - 02/08 0700   I.V. (mL/kg) 102.5 (1.3) 300 (3.7)   Other  45   IV Piggyback 1650    Total Intake(mL/kg) 1752.5 (21.4) 345 (4.2)   Net +1752.5 +345          PHYSICAL EXAMINATION: General:  Appears chronically ill, but not in distress Neuro:  Encephalopathic, somnolent, arouses to stimulation, responds with simple yes and no answers HEENT:  PERRL Cardiovascular:  Bradycardic, regular, no  murmurs Lungs:  Bilateral diminished air entry, no w/r/r Abdomen:  Soft, moderate diffuse tenderness, bowel sounds present Musculoskeletal:  Moves all extremities, no edema Skin:  Intact  LABS:  Lab 02/10/12 0300 02/10/12 0257 02/09/12 2329 02/09/12 1915  HGB -- 10.6* -- 13.1  WBC -- 7.5 -- 7.9  PLT -- 132* -- 182  NA -- 139 -- 136  K -- 5.0 5.1 --  CL -- 107 -- 97  CO2 -- 23 -- 26  GLUCOSE -- 111* -- 130*  BUN -- 28* -- 29*  CREATININE -- 3.97* -- 4.13*  CALCIUM -- 8.4 -- 10.1  MG -- -- -- --  PHOS -- -- -- --  AST -- 11 -- 15  ALT -- <5 -- 8  ALKPHOS -- 55 -- 71  BILITOT -- 0.2* -- 0.4  PROT -- 6.6 -- 8.5*  ALBUMIN -- 3.0* -- 3.7  APTT -- -- -- --  INR -- 3.34* -- 2.42*  LATICACIDVEN -- 0.8 -- --  TROPONINI <0.30 -- -- --  PROCALCITON -- 0.48 -- --  PROBNP -- -- -- --  O2SATVEN -- -- -- --  PHART -- -- -- --  PCO2ART -- -- -- --  PO2ART -- -- -- --    Lab 02/10/12 0744 02/10/12 0526 02/10/12 0340 02/10/12 0248 02/10/12 0226  GLUCAP 140* 96 135* 175* 65*   CXR:  2/7 >>> No overt airspace  disease  ASSESSMENT / PLAN:  PULMONARY A:  No active issues. P:   Goal SpO2>92. Supplemental oxygen PRN.  CARDIOVASCULAR A: Hypotension in setting ov severe dehydration and multiple antihypertensives.  Less likely SIRS / sepsis.  P:  Goal MAP>April. Hold Labetalol, Clonidine, Lisinopril. No indication for vasopressors at this time, hopefully BP will improve with hydration and as labetalol is metabolized. Trend troponin and lactate. Change IVF to NS. 2D echo.  RENAL A:  Acute renal failure in setting of severe dehydration.  Hyperkalemia, resolved. P:   Goal CVP>10. Trend BMP. NS 1000 x 1 again. Change IVF to NS. Single dose of kayexalate.  GASTROINTESTINAL A:  Nausea / vomiting / diarrhea, etiology is not clear, abdominal exam reassuring. P:   Start diet. May need CT abdomen  / pelvis when BP is more stable. C.diff PCR pending. GI Px is not  indicated  HEMATOLOGIC A:  Anticoagulation for PE. P:  Trend CBC, INR Coumadin per Pharmacy DVT PX is not indicated  INFECTIOUS  A:  No clear source of infection.  Fever.  PCN allergy. P:   Cultures and antibiotics as above PCT Flu PCR Droplet isolation   ENDOCRINE  A:  DM2.  Hypoglycemia.  P:   CBG Hold Insulin Cortisol level  NEUROLOGIC A:  Recent SAH, s/p ventriculoperitoneal shunt.  Acute encephalopathy.  Depression. P:   Change Keppra to IV Hold Celexa, Ritalin, Remeron  TODAY'S SUMMARY: [name] (after recent [facility]) admitted 2/6 with fever, nausea, vomiting, diarrhea and acute renal failure.  Transfer back to SDU, fluid resuscitate and call renal with renal U/S ordered.  TRH to pick up on 2/8, PCCM will sign off, please call back if needed.  I have personally obtained a history, examined the patient, evaluated laboratory and imaging results, formulated the assessment and plan and placed orders.  Koren Bound, MD Pulmonary and Critical Care Medicine [facility] Pager: [phone]  02/10/2012, 9:22 AM

## 2012-02-10 NOTE — Progress Notes (Signed)
Was called per nursing that patient with persistent hypotension after being  transferred to the floor from ED after being admitted. Patient received 2 L of IV fluids in the ED and currently on a 4 L however systolic blood pressures in the 70s to 80s.  General: Lethargic however arousable to noxious stimuli Respiratory:  Lungs clear to auscultation bilaterally in anterior lung fields Cardiovascular: Regular rate rhythm no murmurs rubs or gallops Abdomen: Soft, nontender, nondistended, positive bowel sounds extremities: No clubbing cyanosis or edema  #1 hypotension Questionable etiology. Differential includes infectious etiology( patient with temperatures of 103 at home with nausea vomiting and diarrhea ) versus adrenal insufficiency versus volume depletion versus cardiac etiology. Patient is not anemic. Patient with no signs of overt GI bleed. Will transfer patient to the step down unit. Patient currently receiving a liter bolus which make 4 L. Increase IV fluids to 200 cc per hour. Check a random cortisol level, blood cultures x2, chest x-ray, C. difficile PCR, UA with cultures and sensitivities. Will place patient empirically on IV vancomycin, Zosyn, Flagyl. Will consult with critical care medicine for further evaluation and management. Patient may need central line and pressors). Follow.   Updated patient and husband at bedside.

## 2012-02-10 NOTE — Procedures (Signed)
Name:  [name] MRN:  [id number] DOB:  1950/06/04  PROCEDURE NOTE  Procedure:  Ultrasound-guided central venous catheter placement.  Indications:  Need for intravenous access and hemodynamic monitoring.  Consent:  Consent was implied due to the emergency nature of the procedure.  Anesthesia:  A total of 10 mL of 1% Lidocaine was used for local infiltration anesthesia.  Procedure summary:  Appropriate equipment was assembled.  The patient was identified as [name] and safety timeout was performed. The patient was placed in Trendelenburg position.  Sterile technique was used. The patient's left neck region was prepped using chlorhexidine / alcohol scrub and the field was draped in usual sterile fashion with full body drape. The left internal jugular vein and the left carotid artery were identified by ultrasound, the patency was evaluated and images were documented. After the adequate anesthesia was achieved, the vein was cannulated with the introducer needle under sonographic guidance without difficulty. A guide wire was advanced through the introducer needle, which was then withdrawn. A small skin incision was made at the point of wire entry, the dilator was inserted over the guide wire and appropriate dilation was obtained. The dilator was removed and triple-lumen catheter was advanced over the guide wire, which was then removed.  All ports were aspirated and flushed with normal saline without difficulty. The catheter was secured into place with sutures. Antibiotic patch was placed and sterile dressing was applied. Post-procedure chest x-ray was ordered.  Complications:  No immediate complications were noted.  Hemodynamic parameters and oxygenation remained stable throughout the procedure.  Estimated blood loss:  Less then 5 mL.  Orlean Bradford, M.D. Pulmonary and Critical Care Medicine [facility] Cell: [phone] Pager: [phone]  02/10/2012, 4:57 AM

## 2012-02-11 DIAGNOSIS — E119 Type 2 diabetes mellitus without complications: Secondary | ICD-10-CM

## 2012-02-11 LAB — BASIC METABOLIC PANEL
CO2: 23 mEq/L (ref 19–32)
Chloride: 109 mEq/L (ref 96–112)
GFR calc Af Amer: 22 mL/min — ABNORMAL LOW (ref >90)
Potassium: 4.1 mEq/L (ref 3.5–5.1)

## 2012-02-11 LAB — PHOSPHORUS: Phosphorus: 4.3 mg/dL (ref 2.3–4.6)

## 2012-02-11 LAB — GLUCOSE, CAPILLARY
Glucose-Capillary: 77 mg/dL (ref 70–99)
Glucose-Capillary: 109 mg/dL — ABNORMAL HIGH (ref 70–99)
Glucose-Capillary: 123 mg/dL — ABNORMAL HIGH (ref 70–99)

## 2012-02-11 LAB — CBC
HCT: 28.9 % — ABNORMAL LOW (ref 36.0–46.0)
Hemoglobin: 8.9 g/dL — ABNORMAL LOW (ref 12.0–15.0)
MCH: 26 pg (ref 26.0–34.0)
RBC: 3.42 MIL/uL — ABNORMAL LOW (ref 3.87–5.11)

## 2012-02-11 LAB — PROTIME-INR
INR: 3.8 — ABNORMAL HIGH (ref 0.00–1.49)
Prothrombin Time: 35.2 seconds — ABNORMAL HIGH (ref 11.6–15.2)

## 2012-02-11 LAB — URINE CULTURE

## 2012-02-11 LAB — MAGNESIUM: Magnesium: 1.4 mg/dL — ABNORMAL LOW (ref 1.5–2.5)

## 2012-02-11 LAB — PROCALCITONIN: Procalcitonin: 0.24 ng/mL

## 2012-02-11 MED ORDER — AMLODIPINE BESYLATE 5 MG PO TABS
5.0000 mg | ORAL_TABLET | Freq: Every day | ORAL | Status: DC
  Administered 2012-02-11: 5 mg via ORAL
  Filled 2012-02-11 (×2): qty 1

## 2012-02-11 MED ORDER — ACETAMINOPHEN 325 MG PO TABS
650.0000 mg | ORAL_TABLET | Freq: Four times a day (QID) | ORAL | Status: DC | PRN
  Administered 2012-02-11: 650 mg via ORAL
  Filled 2012-02-11: qty 2

## 2012-02-11 MED ORDER — HYDRALAZINE HCL 20 MG/ML IJ SOLN
INTRAMUSCULAR | Status: AC
  Administered 2012-02-11: 10 mg
  Filled 2012-02-11: qty 1

## 2012-02-11 MED ORDER — LEVETIRACETAM 500 MG PO TABS
500.0000 mg | ORAL_TABLET | Freq: Two times a day (BID) | ORAL | Status: DC
  Administered 2012-02-11 – 2012-02-12 (×3): 500 mg via ORAL
  Filled 2012-02-11 (×4): qty 1

## 2012-02-11 MED ORDER — LABETALOL HCL 200 MG PO TABS
200.0000 mg | ORAL_TABLET | Freq: Three times a day (TID) | ORAL | Status: DC
  Administered 2012-02-11 – 2012-02-12 (×4): 200 mg via ORAL
  Filled 2012-02-11 (×6): qty 1

## 2012-02-11 MED ORDER — CITALOPRAM HYDROBROMIDE 20 MG PO TABS
20.0000 mg | ORAL_TABLET | Freq: Every day | ORAL | Status: DC
  Administered 2012-02-11 – 2012-02-12 (×2): 20 mg via ORAL
  Filled 2012-02-11 (×2): qty 1

## 2012-02-11 MED ORDER — OXYCODONE HCL 5 MG PO TABS: 5.0000 mg | ORAL_TABLET | ORAL | Status: DC | PRN

## 2012-02-11 MED ORDER — CLONIDINE HCL 0.2 MG PO TABS
0.2000 mg | ORAL_TABLET | ORAL | Status: DC | PRN
  Filled 2012-02-11: qty 1

## 2012-02-11 MED ORDER — HYDRALAZINE HCL 20 MG/ML IJ SOLN: 10.0000 mg | Freq: Once | INTRAMUSCULAR | Status: DC

## 2012-02-11 MED ORDER — METHYLPHENIDATE HCL 5 MG PO TABS
5.0000 mg | ORAL_TABLET | Freq: Two times a day (BID) | ORAL | Status: DC
Start: 2012-02-11 — End: 2012-02-12
  Administered 2012-02-11 – 2012-02-12 (×2): 5 mg via ORAL
  Filled 2012-02-11 (×2): qty 1

## 2012-02-11 MED ORDER — MAGNESIUM SULFATE 40 MG/ML IJ SOLN
2.0000 g | Freq: Once | INTRAMUSCULAR | Status: DC
  Administered 2012-02-11: 2 g via INTRAVENOUS
  Filled 2012-02-11: qty 50

## 2012-02-11 NOTE — Evaluation (Signed)
Physical Therapy Evaluation Patient Details Name: VERONCIA JEZEK MRN: [id number] DOB: Sep 29, 1950 Today's Date: 02/11/2012 Time: 7829-5621 PT Time Calculation (min): 19 min  PT Assessment / Plan / Recommendation Clinical Impression  Pt admitted with N/V/D and acute kidney failure. Pt has been at [facility] since [facility] but was ready to discharge from there this week. Pt mobilizing well and will follow potentially only one more visit to assess balance and stairs. Pt able to don right sock without difficulty, increased trouble with left sock and min assist to complete. Recommend daily ambulation with RW with nursing staff.     PT Assessment  Patient needs continued PT services    Follow Up Recommendations  Outpatient PT    Does the patient have the potential to tolerate intense rehabilitation      Barriers to Discharge None      Equipment Recommendations  Rolling walker with 5" wheels (pt states spouse was supposed to get RW but unsure if done)    Recommendations for Other Services     Frequency Min 3X/week    Precautions / Restrictions Precautions Precautions: Fall   Pertinent Vitals/Pain No pain sats 95% RA at rest, with HR 93 94% on RA with ambulation HR 84 end of session      Mobility  Bed Mobility Bed Mobility: Supine to Sit;Sitting - Scoot to Edge of Bed Supine to Sit: 6: Modified independent (Device/Increase time);HOB elevated (HOb 20degrees) Sitting - Scoot to Edge of Bed: 6: Modified independent (Device/Increase time) Transfers Transfers: Sit to Stand;Stand to Sit Sit to Stand: 6: Modified independent (Device/Increase time);From bed Stand to Sit: 6: Modified independent (Device/Increase time);To chair/3-in-1 Ambulation/Gait Ambulation/Gait Assistance: 5: Supervision Ambulation Distance (Feet): 500 Feet Assistive device: Rolling walker Ambulation/Gait Assistance Details: cueing x 2 to avoid obstacles to right, pt states RW is pulling but that initially after  [facility] was running into objects to right Gait Pattern: Step-through pattern Stairs: No    Exercises     PT Diagnosis: Difficulty walking  PT Problem List: Decreased mobility PT Treatment Interventions: Functional mobility training;Balance training;Stair training   PT Goals Acute Rehab PT Goals PT Goal Formulation: With patient Time For Goal Achievement: 02/18/12 Potential to Achieve Goals: Good Pt will Ambulate: >150 feet;with modified independence (without AD or running into obstacles) PT Goal: Ambulate - Progress: Goal set today Pt will Go Up / Down Stairs: 1-2 stairs;with supervision;with least restrictive assistive device PT Goal: Up/Down Stairs - Progress: Goal set today Additional Goals Additional Goal #1: Pt will complete DGI with score of 21 or higher  PT Goal: Additional Goal #1 - Progress: Goal set today  Visit Information  Last PT Received On: 02/11/12 Assistance Needed: +1    Subjective Data  Subjective: they were getting ready to let me go home monday Patient Stated Goal: go home with my husband   Prior Functioning  Home Living Lives With: Spouse Available Help at Discharge: Family;Available 24 hours/day Type of Home: House Home Access: Stairs to enter [employer] of Steps: 2 Home Layout: One level Bathroom Shower/Tub: Engineer, manufacturing systems: Standard Home Adaptive Equipment: Environmental consultant - four wheeled Prior Function Level of Independence: Independent with assistive device(s) Able to Take Stairs?: No Driving: No Comments: Pt was at [facility] until this admission but plans to discharge home with spouse. Pt has been performing transfers and ambulation on her own with RW and performing seated baths without assist, assist only for getting in shower Communication Communication: No difficulties  Cognition  Cognition Overall Cognitive Status: Appears within functional limits for tasks assessed/performed Arousal/Alertness:  Awake/alert Orientation Level: Appears intact for tasks assessed Behavior During Session: Flat affect    Extremity/Trunk Assessment Right Upper Extremity Assessment RUE ROM/Strength/Tone: [facility] for tasks assessed Left Upper Extremity Assessment LUE ROM/Strength/Tone: WFL for tasks assessed Right Lower Extremity Assessment RLE ROM/Strength/Tone: [facility] for tasks assessed Left Lower Extremity Assessment LLE ROM/Strength/Tone: [facility] for tasks assessed Trunk Assessment Trunk Assessment: Normal   Balance Standardized Balance Assessment Standardized Balance Assessment: Dynamic Gait Index  End of Session PT - End of Session Equipment Utilized During Treatment: Gait belt Activity Tolerance: Patient tolerated treatment well Patient left: in chair;with nursing in room (in [facility] with RN for transfer to new room) Nurse Communication: Mobility status  GP     Toney Sang Beth 02/11/2012, 2:46 PM Delaney Meigs, PT [phone]

## 2012-02-11 NOTE — Progress Notes (Signed)
eLink Physician-Brief Progress Note Patient Name: [name] DOB: May 24, 1950 MRN: [id number]  Date of Service  02/11/2012   HPI/Events of Note  Hypomag   eICU Interventions  Mag replaced   Intervention Category Minor Interventions: Electrolytes abnormality - evaluation and management  Caitlain Tweed 02/11/2012, 5:45 AM

## 2012-02-11 NOTE — Progress Notes (Signed)
ANTICOAGULATION CONSULT NOTE - FOLLOW UP  Pharmacy Consult for Warfarin Indication: h/o PE  Allergies  Allergen Reactions  . Lactose Intolerance (Gi) Other (See Comments)    G.I. Upset  . Penicillins     Doesn't remember what happens    Patient Measurements: Height: 5\' 5"  (165.1 cm) Weight: 180 lb 12.4 oz (82 kg) IBW/kg (Calculated) : 57  Vital Signs: Temp: 97.6 F (36.4 C) (02/08 0800) Temp src: Oral (02/08 0800) BP: 191/91 mmHg (02/08 0800) Pulse Rate: 73 (02/08 0800) Intake/Output from previous day: 02/07 0701 - 02/08 0700 In: 4750.9 [I.V.:2925.9; IV Piggyback:1255] Out: 1950 [Urine:1950] Intake/Output from this shift:    Labs:  Recent Labs  02/10/12 0257 02/10/12 0427 02/10/12 0830 02/11/12 0415  WBC 7.5  --  6.3 5.9  HGB 10.6*  --  9.7* 8.9*  PLT 132*  --  123* 108*  LABCREA  --  184.47  --   --   CREATININE 3.97*  --  3.55* 2.52*   Estimated Creatinine Clearance: 24.2 ml/min (by C-G formula based on Cr of 2.52). No results found for this basename: VANCOTROUGH, Leodis Binet, VANCORANDOM, GENTTROUGH, GENTPEAK, GENTRANDOM, TOBRATROUGH, TOBRAPEAK, TOBRARND, AMIKACINPEAK, AMIKACINTROU, AMIKACIN,  in the last 72 hours   Microbiology: Recent Results (from the past 720 hour(s))  MRSA PCR SCREENING     Status: None   Collection Time    02/10/12  3:22 AM      Result Value Range Status   MRSA by PCR NEGATIVE  NEGATIVE Final   Comment:            The GeneXpert MRSA Assay (FDA     approved for NASAL specimens     only), is one component of a     comprehensive MRSA colonization     surveillance program. It is not     intended to diagnose MRSA     infection nor to guide or     monitor treatment for     MRSA infections.  URINE CULTURE     Status: None   Collection Time    02/10/12  4:25 AM      Result Value Range Status   Specimen Description URINE, RANDOM   Final   Special Requests NONE   Final   Culture  Setup Time 02/10/2012 06:10   Final   Colony Count  25,000 COLONIES/ML   Final   Culture     Final   Value: Multiple bacterial morphotypes present, none predominant. Suggest appropriate recollection if clinically indicated.   Report Status 02/11/2012 FINAL   Final    Anti-infectives   Start     Dose/Rate Route Frequency Ordered Stop   02/12/12 0400  vancomycin (VANCOCIN) IVPB 1000 mg/200 mL premix     1,000 mg 200 mL/hr over 60 Minutes Intravenous Every 48 hours 02/10/12 1108     02/11/12 1000  oseltamivir (TAMIFLU) capsule 75 mg    Comments:  Tamiflu 75 mg once daily for CrCl < 30 mL/min   75 mg Oral Daily 02/10/12 1414 02/15/12 0959   02/10/12 0500  aztreonam (AZACTAM) 500 mg in dextrose 5 % 50 mL IVPB     500 mg 100 mL/hr over 30 Minutes Intravenous 3 times per day 02/10/12 0418     02/10/12 0430  oseltamivir (TAMIFLU) capsule 75 mg  Status:  Discontinued    Comments:  Tamiflu 75 mg once daily for CrCl < 30 mL/min   75 mg Oral 2 times daily 02/10/12 0351 02/10/12 1414  02/10/12 0400  metroNIDAZOLE (FLAGYL) IVPB 500 mg     500 mg 100 mL/hr over 60 Minutes Intravenous 3 times per day 02/10/12 0304     02/10/12 0400  oseltamivir (TAMIFLU) capsule 75 mg  Status:  Discontinued    Comments:  Tamiflu 75 mg once daily for CrCl < 30 mL/min   75 mg Oral Daily 02/10/12 0304 02/10/12 0351   02/10/12 0400  ceFEPIme (MAXIPIME) 1 g in dextrose 5 % 50 mL IVPB  Status:  Discontinued     1 g 100 mL/hr over 30 Minutes Intravenous Every 24 hours 02/10/12 0318 02/10/12 0349   02/10/12 0330  vancomycin (VANCOCIN) 1,500 mg in sodium chloride 0.9 % 500 mL IVPB     1,500 mg 250 mL/hr over 120 Minutes Intravenous  Once 02/10/12 0313 02/10/12 0708      Assessment: 61 yof admitted with vomiting, diarrhea and ARF on empiric abx and coumadin per pharmacy. In the setting of acute illness and abx coverage INR up to 3.80 with coumadin held yesterday. Currently patient is afebrile and wbc WNL. 1.5g Vancomycin given 2/7. Patient's renal function is improving and  UOP is decent. If her renal function continues to improve tomorrow, this will warrant antibiotic dose adjustment.   Goal of Therapy:  Vancomycin trough level 15-20 mcg/ml INR 2-3  Plan:  1. Repeat no coumadin today in setting of acute illness, poor diet, and abx on board 2. Continue aztreonam 500mg  IV q8h and vancomycin 1000mg  IV q48 hours 3. F/u renal function for potential abx adjustment tomorrow   Thank you,  Brett Fairy, PharmD, BCPS 02/11/2012 8:57 AM

## 2012-02-11 NOTE — Progress Notes (Addendum)
[facility] Progress Note [place] TEAM 1 - Stepdown/ICU TEAM   CHAUNDRA ABREU WUJ:[id number] DOB: [birth year] DOA: 02/09/2012 PCP: No primary provider on file.  Brief narrative: 62 y.o. female who presented to the ED from a SNF with a four day h/o N/V/D and fever of 103.4. Unfortunately this caused the patient to become very dehydrated. She had some cramping quality pain located in her abdomen but not severe pain.  In the ED patient was found to be in AKF with creatinine of 4 (baseline 1.1), potassium of 6.6, no EKG changes.  She was given insulin, glucose, and fluids.  2/6 Admitted with n/v/d and acute renal failure  2/7 Transferred to SDU with hypoglycemia, hypothermia and hypotension  Assessment/Plan:  Acute kidney failure crt steadily improving with volume resuscitation - cont to hold potential nephrotoxins - renal US w/o acute findings  Hypotension >> hx of HTN  W/ volume expansion has now rebounded back to HTN - slow IVF - resume oral meds  Hyperkalemia Due to ARF - resolved  N/V/D procalcitonin is normal - the pt does not have an elevated WBC count - she is not febrile - if this is infectious, I suspect it is viral - I will d/c abx and follow clinically as there is no clear sign of a bacterial infection  +UA Not very impressive, but not normal - cx unrevealing  bilateral extensive PE - diagnosed Oct 2013  Continue anticoag w/ coumadin per pharmacy  DM2 CBG well controlled   Hx of SAH s/p VP shunt and aneurysm coiling Aug 2013 CT head at time of admission was w/o acute findings  Code Status: FULL Family Communication: spoke w/ pt, husband, and sister at bedisde Disposition Plan: transfer to tele bed  Consultants: PCCM  Procedures: L IJ TLC 2/6>>>  Antibiotics: Aztreonam 2/7 >>> 2/8 Vancomycin 2/7 >>>  2/8 Flagyl 2/7 >>>  2/8 Tamiflu 2/7 >>> 2/8  DVT prophylaxis: Anticoagulated (coumadin)  HPI/Subjective: Pt is alert and conversant.  "Back to  normal" per husband.  She is not having diarrhea, and denies ever having watery diarrhea.  She primarily had nausea and vomiting.  No current N/V, HA, f/c, sob, or cp.  No abdom pain.  Objective: Blood pressure 139/66, pulse 79, temperature 97.6 F (36.4 C), temperature source Oral, resp. rate 24, height 5\' 5"  (1.651 m), weight 82 kg (180 lb 12.4 oz), SpO2 100.00%.  Intake/Output Summary (Last 24 hours) at 02/11/12 0947 Last data filed at 02/11/12 0700  Gross per 24 hour  Intake 4405.92 ml  Output   1950 ml  Net 2455.92 ml     Exam: General: No acute respiratory distress Lungs: Clear to auscultation bilaterally without wheezes or crackles Cardiovascular: Regular rate and rhythm without murmur gallop or rub  Abdomen: Nontender, nondistended, soft, bowel sounds positive, no rebound, no ascites, no appreciable mass Extremities: No significant cyanosis, clubbing, or edema bilateral lower extremities  Data Reviewed: Basic Metabolic Panel:  Recent Labs Lab 02/09/12 1915 02/09/12 2234 02/09/12 2329 02/10/12 0257 02/10/12 0830 02/11/12 0415  NA 136  --   --  139 137 141  K 6.6* 6.6* 5.1 5.0 5.2* 4.1  CL 97  --   --  107 106 109  CO2 26  --   --  23 24 23   GLUCOSE 130*  --   --  111* 124* 95  BUN 29*  --   --  28* 28* 24*  CREATININE 4.13*  --   --  3.97*  3.55* 2.52*  CALCIUM 10.1  --   --  8.4 8.2* 8.1*  MG  --   --   --   --   --  1.4*  PHOS  --   --   --   --   --  4.3   Liver Function Tests:  Recent Labs Lab 02/09/12 1915 02/10/12 0257  AST 15 11  ALT 8 <5  ALKPHOS 71 55  BILITOT 0.4 0.2*  PROT 8.5* 6.6  ALBUMIN 3.7 3.0*    Recent Labs Lab 02/09/12 1915 02/10/12 0257  LIPASE 57  --   AMYLASE  --  105   CBC:  Recent Labs Lab 02/09/12 1915 02/10/12 0257 02/10/12 0830 02/11/12 0415  WBC 7.9 7.5 6.3 5.9  NEUTROABS 5.5 5.1  --   --   HGB 13.1 10.6* 9.7* 8.9*  HCT 41.3 33.6* 31.0* 28.9*  MCV 83.9 84.6 84.7 84.5  PLT 182 132* 123* 108*   Cardiac  Enzymes:  Recent Labs Lab 02/10/12 0300 02/10/12 1253 02/11/12 0030 02/11/12 0630  TROPONINI <0.30 <0.30 <0.30 <0.30   CBG:  Recent Labs Lab 02/10/12 1614 02/10/12 1939 02/10/12 2313 02/11/12 0335 02/11/12 0852  GLUCAP 112* 111* 106* 77 109*    Recent Results (from the past 240 hour(s))  MRSA PCR SCREENING     Status: None   Collection Time    02/10/12  3:22 AM      Result Value Range Status   MRSA by PCR NEGATIVE  NEGATIVE Final   Comment:            The GeneXpert MRSA Assay (FDA     approved for NASAL specimens     only), is one component of a     comprehensive MRSA colonization     surveillance program. It is not     intended to diagnose MRSA     infection nor to guide or     monitor treatment for     MRSA infections.  URINE CULTURE     Status: None   Collection Time    02/10/12  4:25 AM      Result Value Range Status   Specimen Description URINE, RANDOM   Final   Special Requests NONE   Final   Culture  Setup Time 02/10/2012 06:10   Final   Colony Count 25,000 COLONIES/ML   Final   Culture     Final   Value: Multiple bacterial morphotypes present, none predominant. Suggest appropriate recollection if clinically indicated.   Report Status 02/11/2012 FINAL   Final     Studies:  Recent x-ray studies have been reviewed in detail by the Attending Physician  Scheduled Meds:  Reviewed in detail by the Attending Physician   Lonia Blood, MD [facility] Office  [phone] Pager [phone]  On-Call/Text Page:      [email or web address]      password [email or web address]  If 7PM-7AM, please contact night-coverage [email or web address] Password [facility] 02/11/2012, 9:47 AM   LOS: 2 days

## 2012-02-12 DIAGNOSIS — N179 Acute kidney failure, unspecified: Secondary | ICD-10-CM | POA: Diagnosis present

## 2012-02-12 LAB — GLUCOSE, CAPILLARY: Glucose-Capillary: 86 mg/dL (ref 70–99)

## 2012-02-12 LAB — CBC
HCT: 29.6 % — ABNORMAL LOW (ref 36.0–46.0)
Hemoglobin: 9.3 g/dL — ABNORMAL LOW (ref 12.0–15.0)
MCH: 26.2 pg (ref 26.0–34.0)
MCHC: 31.4 g/dL (ref 30.0–36.0)
MCV: 83.4 fL (ref 78.0–100.0)
Platelets: 124 K/uL — ABNORMAL LOW (ref 150–400)
RBC: 3.55 MIL/uL — ABNORMAL LOW (ref 3.87–5.11)
RDW: 16.7 % — ABNORMAL HIGH (ref 11.5–15.5)
WBC: 5.8 K/uL (ref 4.0–10.5)

## 2012-02-12 LAB — BASIC METABOLIC PANEL
BUN: 20 mg/dL (ref 6–23)
CO2: 25 mEq/L (ref 19–32)
Calcium: 8.7 mg/dL (ref 8.4–10.5)
Glucose, Bld: 85 mg/dL (ref 70–99)
Sodium: 141 mEq/L (ref 135–145)

## 2012-02-12 MED ORDER — WARFARIN SODIUM 5 MG PO TABS
5.0000 mg | ORAL_TABLET | Freq: Once | ORAL | Status: DC
  Filled 2012-02-12: qty 1

## 2012-02-12 MED ORDER — AMLODIPINE BESYLATE 10 MG PO TABS: 10.0000 mg | ORAL_TABLET | Freq: Every day | ORAL | Status: AC

## 2012-02-12 MED ORDER — AMLODIPINE BESYLATE 10 MG PO TABS
10.0000 mg | ORAL_TABLET | Freq: Every day | ORAL | Status: DC
  Administered 2012-02-12: 10 mg via ORAL
  Filled 2012-02-12: qty 1

## 2012-02-12 MED ORDER — POTASSIUM CHLORIDE CRYS ER 20 MEQ PO TBCR
40.0000 meq | EXTENDED_RELEASE_TABLET | Freq: Once | ORAL | Status: AC
  Administered 2012-02-12: 40 meq via ORAL
  Filled 2012-02-12: qty 2

## 2012-02-12 NOTE — Progress Notes (Signed)
ANTICOAGULATION CONSULT NOTE - FOLLOW UP  Pharmacy Consult for Warfarin Indication: h/o PE  Allergies  Allergen Reactions  . Lactose Intolerance (Gi) Other (See Comments)    G.I. Upset  . Penicillins     Doesn't remember what happens    Patient Measurements: Height: 5\' 5"  (165.1 cm) Weight: 192 lb 10.9 oz (87.4 kg) IBW/kg (Calculated) : 57  Vital Signs: Temp: 98.4 F (36.9 C) (02/09 0600) BP: 179/89 mmHg (02/09 0600) Pulse Rate: 72 (02/09 0600) Intake/Output from previous day: 02/08 0701 - 02/09 0700 In: 1200 [I.V.:1150; IV Piggyback:50] Out: 4050 [Urine:4050] Intake/Output from this shift:    Labs:  Recent Labs  02/10/12 0257 02/10/12 0427 02/10/12 0830 02/11/12 0415 02/12/12 0515  WBC 7.5  --  6.3 5.9 5.8  HGB 10.6*  --  9.7* 8.9* 9.3*  PLT 132*  --  123* 108* 124*  LABCREA  --  184.47  --   --   --   CREATININE 3.97*  --  3.55* 2.52* 1.58*   Estimated Creatinine Clearance: 39.8 ml/min (by C-G formula based on Cr of 1.58). No results found for this basename: VANCOTROUGH, Leodis Binet, VANCORANDOM, GENTTROUGH, GENTPEAK, GENTRANDOM, TOBRATROUGH, TOBRAPEAK, TOBRARND, AMIKACINPEAK, AMIKACINTROU, AMIKACIN,  in the last 72 hours   Microbiology: Recent Results (from the past 720 hour(s))  MRSA PCR SCREENING     Status: None   Collection Time    02/10/12  3:22 AM      Result Value Range Status   MRSA by PCR NEGATIVE  NEGATIVE Final   Comment:            The GeneXpert MRSA Assay (FDA     approved for NASAL specimens     only), is one component of a     comprehensive MRSA colonization     surveillance program. It is not     intended to diagnose MRSA     infection nor to guide or     monitor treatment for     MRSA infections.  CULTURE, BLOOD (ROUTINE X 2)     Status: None   Collection Time    02/10/12  3:50 AM      Result Value Range Status   Specimen Description Blood   Final   Special Requests NONE   Final   Culture  Setup Time 02/10/2012 08:32   Final   Culture     Final   Value:        BLOOD CULTURE RECEIVED NO GROWTH TO DATE CULTURE WILL BE HELD FOR 5 DAYS BEFORE ISSUING A FINAL NEGATIVE REPORT   Report Status PENDING   Incomplete  CULTURE, BLOOD (ROUTINE X 2)     Status: None   Collection Time    02/10/12  4:00 AM      Result Value Range Status   Specimen Description Blood   Final   Special Requests NONE   Final   Culture  Setup Time 02/10/2012 08:33   Final   Culture     Final   Value:        BLOOD CULTURE RECEIVED NO GROWTH TO DATE CULTURE WILL BE HELD FOR 5 DAYS BEFORE ISSUING A FINAL NEGATIVE REPORT   Report Status PENDING   Incomplete  URINE CULTURE     Status: None   Collection Time    02/10/12  4:25 AM      Result Value Range Status   Specimen Description URINE, RANDOM   Final   Special Requests NONE  Final   Culture  Setup Time 02/10/2012 06:10   Final   Colony Count 25,000 COLONIES/ML   Final   Culture     Final   Value: Multiple bacterial morphotypes present, none predominant. Suggest appropriate recollection if clinically indicated.   Report Status 02/11/2012 FINAL   Final    Anti-infectives   Start     Dose/Rate Route Frequency Ordered Stop   02/12/12 0400  vancomycin (VANCOCIN) IVPB 1000 mg/200 mL premix  Status:  Discontinued     1,000 mg 200 mL/hr over 60 Minutes Intravenous Every 48 hours 02/10/12 1108 02/11/12 0957   02/11/12 1000  oseltamivir (TAMIFLU) capsule 75 mg  Status:  Discontinued    Comments:  Tamiflu 75 mg once daily for CrCl < 30 mL/min   75 mg Oral Daily 02/10/12 1414 02/11/12 0957   02/10/12 0500  aztreonam (AZACTAM) 500 mg in dextrose 5 % 50 mL IVPB  Status:  Discontinued     500 mg 100 mL/hr over 30 Minutes Intravenous 3 times per day 02/10/12 0418 02/11/12 0957   02/10/12 0430  oseltamivir (TAMIFLU) capsule 75 mg  Status:  Discontinued    Comments:  Tamiflu 75 mg once daily for CrCl < 30 mL/min   75 mg Oral 2 times daily 02/10/12 0351 02/10/12 1414   02/10/12 0400  metroNIDAZOLE (FLAGYL)  IVPB 500 mg  Status:  Discontinued     500 mg 100 mL/hr over 60 Minutes Intravenous 3 times per day 02/10/12 0304 02/11/12 0957   02/10/12 0400  oseltamivir (TAMIFLU) capsule 75 mg  Status:  Discontinued    Comments:  Tamiflu 75 mg once daily for CrCl < 30 mL/min   75 mg Oral Daily 02/10/12 0304 02/10/12 0351   02/10/12 0400  ceFEPIme (MAXIPIME) 1 g in dextrose 5 % 50 mL IVPB  Status:  Discontinued     1 g 100 mL/hr over 30 Minutes Intravenous Every 24 hours 02/10/12 0318 02/10/12 0349   02/10/12 0330  vancomycin (VANCOCIN) 1,500 mg in sodium chloride 0.9 % 500 mL IVPB     1,500 mg 250 mL/hr over 120 Minutes Intravenous  Once 02/10/12 0313 02/10/12 0708      Assessment: 62 y/o female patient admitted with vomiting, diarrhea and ARF, on chronic coumadin for h/o PE. INR elevated upon admit, most likely diet releated, lack of vitamin k. INR therapeutic today , ok to resume. Home dose is 5.5mg  daily.  Goal of Therapy:  INR 2-3  Plan:  Coumadin 5mg  today and f/u daily protime  Verlene Mayer, PharmD, [place] Pager [phone] 02/12/2012 10:35 AM

## 2012-02-12 NOTE — Discharge Summary (Signed)
Physician Discharge Summary  [name] JXB:[id number] DOB: 1950/02/16 DOA: 02/09/2012  PCP: No primary provider on file.  Admit date: 02/09/2012 Discharge date: 02/12/2012  Time spent: 40  minutes  Recommendations for Outpatient Follow-up:  1. Return to [facility]. Please check BMET in 2 days for k and creatinine. Monitor INR. Patient and her husband trying to establish care with new PCP ( Dr Sherril Croon) as soon as discharged from SNF  Discharge Diagnoses:  Principal Problem:   Sepsis   Active Problems:   Acute kidney injury   Diabetes mellitus, type 2   Fever   Hyperkalemia   Nausea vomiting and diarrhea   Hypotension   Hypoglycemia   Hypothermia Hx of SAH HX of PE   Discharge Condition: *fair  Diet recommendation: diabetic  Filed Weights   02/10/12 0600 02/11/12 1515 02/12/12 0600  Weight: 82 kg (180 lb 12.4 oz) 88.4 kg (194 lb 14.2 oz) 87.4 kg (192 lb 10.9 oz)    History of present illness:  Please refer to admission H&P for details but in brief, 62 y.o. female who presented to the ED from a SNF with a four day h/o N/V/D and fever of 103.4. Unfortunately this caused the patient to become very dehydrated. She had some cramping quality pain located in her abdomen but not severe pain.  In the ED patient was found to be in AKF with creatinine of 4 (baseline 1.1), potassium of 6.6, no EKG changes. She was given insulin, glucose, and fluids.  2/6 Admitted to ICU  Ffor sepsis  with n/v/d and acute renal failure  2/7 Transferred to SDU with hypoglycemia, hypothermia and hypotension   Hospital Course:   Assessment/Plan:   Acute kidney injury Likely prerenal from hypovolemia. Renal function progressively   improving with IV fluids - cont to hold potential nephrotoxins  Including lisinopril which she is at home. Creatinine 1.58 today. Recheck in 2 days at [facility].  Sepsis with hypotension Given aggressive IV hydration and BP improved. Started on amlodipine and dose now  increased .  had Nausea vomiting and diarrhea o presentation. No infectious source found. Afebrile. Likely viral etiology.  Hyperkalemia  Due to AKI and now resolved. Low k of 3.3 today . Replenished and  Resume home kcl    bilateral extensive PE - diagnosed Oct 2013  Continue coumadin . INR therapeutic  DM type 2  CBG well controlled   Hx of SAH s/p VP shunt and aneurysm coiling Aug 2013  CT head at time of admission was w/o acute findings . Patient clinically stable to be discharged back to SNF. She was planned fto be dsicharged home from there in a week time. Husband is trying to establish her  care with Dr Sherril Croon and tells me this will likely be set up soon after she is discharged home from SNF.    Code Status: FULL  Family Communication: husband at bedside      Procedures:  none  Consultations:  PCCM  Discharge Exam: Filed Vitals:   02/11/12 1435 02/11/12 1515 02/11/12 2100 02/12/12 0600  BP:  142/75 147/82 179/89  Pulse: 84  81 72  Temp:  98.8 F (37.1 C) 99.6 F (37.6 C) 98.4 F (36.9 C)  TempSrc:      Resp:  20 14 14   Height:  5\' 5"  (1.651 m)    Weight:  88.4 kg (194 lb 14.2 oz)  87.4 kg (192 lb 10.9 oz)  SpO2: 94% 100% 94% 94%  General:  Elderly female lying in bed in NAD  HEENT: no pallor, moist oral mucosa, Left IJ ( will be removed prior to discharge)  Cardiovascular: NS1&S2, no murmurs, rubs or gallop  Respiratory: clear b/l, no added sounds  Abdomen: soft, NT, ND, bs+  EXT: Warm, no edema,    CNS: AAOX 3  Discharge Instructions   Future Appointments Provider Department Dept Phone   02/21/2012 12:00 PM Ranelle Oyster, MD [facility] [phone]       Medication List    STOP taking these medications       lisinopril 20 MG tablet  Commonly known as:  PRINIVIL,ZESTRIL      TAKE these medications       allopurinol 150 mg Tabs  Commonly known as:  ZYLOPRIM  Take 150 mg by mouth daily.      amLODipine 10 MG tablet  Commonly known as:  NORVASC  Take 1 tablet (10 mg total) by mouth daily.     citalopram 20 MG tablet  Commonly known as:  CELEXA  Take 20 mg by mouth daily.     cloNIDine 0.2 MG tablet  Commonly known as:  CATAPRES  Take 1 tablet (0.2 mg total) by mouth every 2 (two) hours as needed (systolic BP > 160 or diastolic > 110).     docusate sodium 100 MG capsule  Commonly known as:  COLACE  Take 100 mg by mouth 2 (two) times daily.     DULCOLAX 10 MG suppository  Generic drug:  bisacodyl  Place 10 mg rectally daily as needed. For constipation.     ferrous sulfate 325 (65 FE) MG tablet  Take 325 mg by mouth 3 (three) times daily with meals.     insulin aspart 100 UNIT/ML injection  Commonly known as:  novoLOG  Inject 0-12 Units into the skin 4 (four) times daily -  before meals and at bedtime. Sliding scale with meals and bedtime coverage     insulin glargine 100 UNIT/ML injection  Commonly known as:  LANTUS  Inject 4 Units into the skin at bedtime.     labetalol 200 MG tablet  Commonly known as:  NORMODYNE  Take 200 mg by mouth 3 (three) times daily.     levETIRAcetam 500 MG tablet  Commonly known as:  KEPPRA  Take 1 tablet (500 mg total) by mouth 2 (two) times daily.     methylphenidate 5 MG tablet  Commonly known as:  RITALIN  Take 5 mg by mouth 2 (two) times daily. Morning and noon     mirtazapine 15 MG tablet  Commonly known as:  REMERON  Take 7.5 mg by mouth at bedtime.     pantoprazole 40 MG tablet  Commonly known as:  PROTONIX  Take 40 mg by mouth daily.     potassium chloride SA 20 MEQ tablet  Commonly known as:  K-DUR,KLOR-CON  Take 40 mEq by mouth daily.     warfarin 2.5 MG tablet  Commonly known as:  COUMADIN  Take 2.5 mg by mouth daily. Take with 3mg  tablet for total dose of 5.5mg      warfarin 3 MG tablet  Commonly known as:  COUMADIN  Take 3 mg by mouth daily. Take with 2.5mg  tablet for total of 5.5mg             Follow-up Information   Please follow up. (follow up at [facility]. patient trying to assign a new PCP in the community.)  The results of significant diagnostics from this hospitalization (including imaging, microbiology, ancillary and laboratory) are listed below for reference.    Significant Diagnostic Studies: Ct Head Wo Contrast  02/09/2012  *RADIOLOGY REPORT*  Clinical Data: Nausea, emesis, diarrhea  CT HEAD WITHOUT CONTRAST  Technique:  Contiguous axial images were obtained from the base of the skull through the vertex without contrast.  Comparison: 10/08/2011; 09/21/2011  Findings:  Redemonstrated prior ACA distribution infarcts involving the bilateral frontal lobes, left greater than right.  Unchanged lacunar infarcts within the anterior horn of the left lateral ventricle and left basal ganglia.  Grossly unchanged rather extensive periventricular hypodensity compatible microvascular ischemic disease. Given extensive background parenchymal abnormalities, there is no CT evidence of acute large territory infarct.  Right frontal approach ventriculostomy catheter again terminates regional to the third ventricle.  The ventricles and basilar cisterns are unchanged in size configuration.  No intraparenchymal or extra-axial mass or hemorrhage.  No midline shift.  Embolization coils are again seen regional to the effected location of the ACAs.  Limited visualization of paranasal sinuses and mastoid air cells are normal.  Regional soft tissues are normal. No displaced calvarial fracture.  IMPRESSION: 1.  No acute findings. 2.  Grossly unchanged sequela of prior ACA distribution infarct. 3.  Unchanged right frontal approach ventriculostomy catheter with unchanged size of the ventricles and basilar cisterns.   Original Report Authenticated By: Tacey Ruiz, MD    US Renal  02/10/2012  *RADIOLOGY REPORT*  Clinical Data: History of hypertension and diabetes.  History of elevation of BUN and creatinine levels.   RENAL/URINARY TRACT ULTRASOUND COMPLETE  Comparison:  None.  Findings:  Right Kidney:  Right renal length is 11.8 cm. No right nephrolithiasis is evident.  Left Kidney:  Left renal vein is 11.3 cm. Small echogenic foci are seen in the midportion of the left kidney.  Small calculi cannot be excluded.  No hydronephrosis is evident.  Examination of each kidney shows no evidence of hydronephrosis, solid or cystic mass,  parenchymal loss, or parenchymal textural abnormality.  Bladder:  A Foley catheter is present draining  the urinary bladder.  Incidental note is made of cholelithiasis.  There are calculi within the gallbladder.  The largest calculus has a greatest diameter of 11 mm.  There is slight thickening of the gallbladder wall.  There is also sludge within the gallbladder.  IMPRESSION: Normal sized kidneys.  No evidence of hydronephrosis.  Small echogenic foci in the left kidney.  Small calculi cannot be excluded.  No solid or cystic mass evident.  Foley catheter in urinary bladder.  Cholelithiasis.  Thickening of gallbladder wall.   Original Report Authenticated By: Onalee Hua Call    Dg Chest Port 1 View  02/10/2012  *RADIOLOGY REPORT*  Clinical Data: Central line placement.  PORTABLE CHEST - 1 VIEW  Comparison: 02/10/2012 at 0312 hours  Findings: Since the previous study, there has been interval placement of a left central venous catheter via internal jugular approach.  The tip is probably in the upper right atrium, allowing for shallow inspiration.  No pneumothorax.  Heart size is increased.  Pulmonary vascularity is normal.  Ventricular peritoneal shunt tubing.  No developing consolidation or atelectasis.  No blunting of costophrenic angles.  Degenerative changes in the spine.  IMPRESSION: Left central venous catheter placed with tip in the upper right atrium.  No pneumothorax.   Original Report Authenticated By: Burman Nieves, M.D.    Dg Chest Port 1 View  02/10/2012  *RADIOLOGY REPORT*  Clinical Data:  Hypotension.  PORTABLE CHEST - 1 VIEW  Comparison: 02/09/2012  Findings: Shallow inspiration.  Normal heart size and pulmonary vascularity for technique.  Infiltrative changes seen previously in the right upper lung is no longer apparent.  This may be due to overlapping structures.  No focal consolidation demonstrated on the current study.  No blunting of costophrenic angles.  No pneumothorax.  Ventricular peritoneal shunt tubing is present.  IMPRESSION: Shallow inspiration.  No evidence of active pulmonary disease.   Original Report Authenticated By: Burman Nieves, M.D.    Dg Abd Acute W/chest  02/09/2012  *RADIOLOGY REPORT*  Clinical Data: Nausea, emesis, history of seizure disorder  ACUTE ABDOMEN SERIES (ABDOMEN 2 VIEW & CHEST 1 VIEW)  Comparison: Chest radiograph - 10/16/2011; 10/08/2011; CT abdomen pelvis - 10/13/2011; chest CT - 10/14/2011  Findings:  Grossly unchanged cardiac silhouette and mediastinal contours with mild tortuosity ectasia of the thoracic aorta. Possible developing air space opacity within the medial aspect the right upper lung. No pleural effusion or pneumothorax.  Moderate gaseous distension of the colon without definite evidence of obstruction.  No pneumoperitoneum, pneumatosis or portal venous gas.  Ventriculoperitoneal tubing courses along the right mid clavicular line is coiled within the left lower abdominal quadrant.  No definite evidence of catheter kink or fracture/discontinuity.  No definite abnormal intra-abdominal calcifications.  No acute osseous abnormalities.  IMPRESSION: 1.  Possible developing air space opacity in the medial aspect of right upper lung for infection.  Further evaluation with a PA and lateral chest radiograph may be obtained as clinically indicated.  2. Moderate gas distension of the colon without evidence of obstruction. 3.  Ventriculoperitoneal catheter tubing without discrete evidence of kink or fracture.   Original Report Authenticated By: Tacey Ruiz, MD     Microbiology: Recent Results (from the past 240 hour(s))  MRSA PCR SCREENING     Status: None   Collection Time    02/10/12  3:22 AM      Result Value Range Status   MRSA by PCR NEGATIVE  NEGATIVE Final   Comment:            The GeneXpert MRSA Assay (FDA     approved for NASAL specimens     only), is one component of a     comprehensive MRSA colonization     surveillance program. It is not     intended to diagnose MRSA     infection nor to guide or     monitor treatment for     MRSA infections.  CULTURE, BLOOD (ROUTINE X 2)     Status: None   Collection Time    02/10/12  3:50 AM      Result Value Range Status   Specimen Description Blood   Final   Special Requests NONE   Final   Culture  Setup Time 02/10/2012 08:32   Final   Culture     Final   Value:        BLOOD CULTURE RECEIVED NO GROWTH TO DATE CULTURE WILL BE HELD FOR 5 DAYS BEFORE ISSUING A FINAL NEGATIVE REPORT   Report Status PENDING   Incomplete  CULTURE, BLOOD (ROUTINE X 2)     Status: None   Collection Time    02/10/12  4:00 AM      Result Value Range Status   Specimen Description Blood   Final   Special Requests NONE   Final   Culture  Setup Time 02/10/2012 08:33  Final   Culture     Final   Value:        BLOOD CULTURE RECEIVED NO GROWTH TO DATE CULTURE WILL BE HELD FOR 5 DAYS BEFORE ISSUING A FINAL NEGATIVE REPORT   Report Status PENDING   Incomplete  URINE CULTURE     Status: None   Collection Time    02/10/12  4:25 AM      Result Value Range Status   Specimen Description URINE, RANDOM   Final   Special Requests NONE   Final   Culture  Setup Time 02/10/2012 06:10   Final   Colony Count 25,000 COLONIES/ML   Final   Culture     Final   Value: Multiple bacterial morphotypes present, none predominant. Suggest appropriate recollection if clinically indicated.   Report Status 02/11/2012 FINAL   Final     Labs: Basic Metabolic Panel:  Recent Labs Lab 02/09/12 1915  02/09/12 2329  02/10/12 0257 02/10/12 0830 02/11/12 0415 02/12/12 0515  NA 136  --   --  139 137 141 141  K 6.6*  < > 5.1 5.0 5.2* 4.1 3.3*  CL 97  --   --  107 106 109 106  CO2 26  --   --  23 24 23 25   GLUCOSE 130*  --   --  111* 124* 95 85  BUN 29*  --   --  28* 28* 24* 20  CREATININE 4.13*  --   --  3.97* 3.55* 2.52* 1.58*  CALCIUM 10.1  --   --  8.4 8.2* 8.1* 8.7  MG  --   --   --   --   --  1.4*  --   PHOS  --   --   --   --   --  4.3  --   < > = values in this interval not displayed. Liver Function Tests:  Recent Labs Lab 02/09/12 1915 02/10/12 0257  AST 15 11  ALT 8 <5  ALKPHOS 71 55  BILITOT 0.4 0.2*  PROT 8.5* 6.6  ALBUMIN 3.7 3.0*    Recent Labs Lab 02/09/12 1915 02/10/12 0257  LIPASE 57  --   AMYLASE  --  105   No results found for this basename: AMMONIA,  in the last 168 hours CBC:  Recent Labs Lab 02/09/12 1915 02/10/12 0257 02/10/12 0830 02/11/12 0415 02/12/12 0515  WBC 7.9 7.5 6.3 5.9 5.8  NEUTROABS 5.5 5.1  --   --   --   HGB 13.1 10.6* 9.7* 8.9* 9.3*  HCT 41.3 33.6* 31.0* 28.9* 29.6*  MCV 83.9 84.6 84.7 84.5 83.4  PLT 182 132* 123* 108* 124*   Cardiac Enzymes:  Recent Labs Lab 02/10/12 0300 02/10/12 1253 02/11/12 0030 02/11/12 0630  TROPONINI <0.30 <0.30 <0.30 <0.30   BNP: BNP (last 3 results) No results found for this basename: PROBNP,  in the last 8760 hours CBG:  Recent Labs Lab 02/11/12 1230 02/11/12 1722 02/11/12 1958 02/12/12 0405 02/12/12 0739  GLUCAP 123* 96 97 92 86       Signed:  Darnise Montag  [facility] 02/12/2012, 10:17 AM

## 2012-02-12 NOTE — Clinical Social Work Note (Signed)
Patient to return to [facility] today via family transport. Patient, patient's husband and facility aware of transfer. FL2 updated and discharge packet complete. CSW signing off.  Ricke Hey, [place] [phone] (weekend)

## 2012-02-17 LAB — CULTURE, BLOOD (ROUTINE X 2): Culture: NO GROWTH

## 2012-02-21 ENCOUNTER — Encounter: Payer: Medicaid Other | Attending: Physical Medicine & Rehabilitation | Admitting: Physical Medicine & Rehabilitation

## 2012-02-21 ENCOUNTER — Encounter: Payer: Self-pay | Admitting: Physical Medicine & Rehabilitation

## 2012-02-21 VITALS — BP 144/89 | HR 61 | Resp 14 | Ht 65.0 in | Wt 188.0 lb

## 2012-02-21 DIAGNOSIS — I609 Nontraumatic subarachnoid hemorrhage, unspecified: Secondary | ICD-10-CM | POA: Insufficient documentation

## 2012-02-21 DIAGNOSIS — G40909 Epilepsy, unspecified, not intractable, without status epilepticus: Secondary | ICD-10-CM

## 2012-02-21 DIAGNOSIS — G9340 Encephalopathy, unspecified: Secondary | ICD-10-CM | POA: Insufficient documentation

## 2012-02-21 MED ORDER — LEVETIRACETAM 500 MG PO TABS: 500.0000 mg | ORAL_TABLET | Freq: Two times a day (BID) | ORAL | Status: DC

## 2012-02-21 MED ORDER — METHYLPHENIDATE HCL 5 MG PO TABS: 5.0000 mg | ORAL_TABLET | Freq: Two times a day (BID) | ORAL | Status: DC

## 2012-02-21 NOTE — Patient Instructions (Signed)
CALL ME WITH QUESTIONS OR WHEN YOU NEED REFILLS ON YOUR RITALIN.  CHECK YOUR BLOOD PRESSURE AGAINST BARE SKIN FOR MORE ACCURATE READING.  MAKE A LIST WITH A DESCRIPTION OF YOUR MEDICATIONS SO THAT YOU CAN EASILY IDENTIFY THEM

## 2012-02-21 NOTE — Progress Notes (Signed)
Subjective:    Patient ID: [name], female    DOB: [birth year], 62 y.o.   MRN: [id number]  HPI  [name] is back regarding her TBI. She and her husband were at [facility] up until Friday. They have since moved back to their house which was just refurbished. 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 Her BP has been elevated on occasion, for which her husband has given her trazodone.  Her mood has been reasonable. She still doesn't initiate much. The patient doesn't feel that her mood is much different than it was before.  An RN is coming out to the home to check on her. It appears she may have an aid at the house as well. She sees her pcp tomorrow  She denies pain.  Pain Inventory Average Pain 0 Pain Right Now 0 My pain is n/a  In the last 24 hours, has pain interfered with the following? General activity 0 Relation with others 0 Enjoyment of life 0 What TIME of day is your pain at its worst? n/a Sleep (in general) Good  Pain is worse with: some activites Pain improves with: pacing activities Relief from Meds: n/a  Mobility walk with assistance how many minutes can you walk? 10 ability to climb steps?  yes do you drive?  no Do you have any goals in this area?  yes  Function not employed: date last employed 2013 I need assistance with the following:  feeding, dressing, bathing, toileting, meal prep, household duties and shopping Do you have any goals in this area?  yes  Neuro/Psych No problems in this area  Prior Studies Any changes since last visit?  no  Physicians involved in your care Any changes since last visit?  no   History reviewed. No pertinent family history. History   Social History  . Marital Status: Married    Spouse Name: N/A    Number of Children: N/A  . Years of Education: N/A   Social History Main Topics  . Smoking status: Never Smoker   . Smokeless tobacco: None  . Alcohol Use: No  . Drug Use: No  . Sexually Active:     Other Topics Concern  . None   Social History Narrative  . None   Past Surgical History  Procedure Laterality Date  . Abdominal hysterectomy    . Ventriculoperitoneal shunt  09/02/2011    Procedure: SHUNT INSERTION VENTRICULAR-PERITONEAL;  Surgeon: Maeola Harman, MD;  Location: [facility];  Service: Neurosurgery;  Laterality: Right;  . Aneurysm coiling  08/16/11   Past Medical History  Diagnosis Date  . Hypertension   . Stroke   . PE (pulmonary embolism)   . Seizure disorder   . SAH (subarachnoid hemorrhage) 08/16/11  . Depression, reactive   . Diabetes mellitus   . Esophageal reflux   . Heart disease   . Hypopotassemia   . Attention deficit disorder of childhood with hyperactivity    BP 144/89  Pulse 61  Resp 14  Ht 5\' 5"  (1.651 m)  Wt 188 lb (85.276 kg)  BMI 31.28 kg/m2  SpO2 97%     Review of Systems  All other systems reviewed and are negative.       Objective:   Physical Exam General: Alert and oriented to name, date with extra time with cueing. Still flat.  HEENT: Head is normocephalic, atraumatic, PERRLA, EOMI, sclera anicteric, oral mucosa pink and moist, dentition intact, ext ear canals clear,  Neck:  Supple without JVD or lymphadenopathy  Heart: Reg rate and rhythm. No murmurs rubs or gallops  Chest: CTA bilaterally without wheezes, rales, or rhonchi; no distress  Abdomen: Soft, non-tender, non-distended, bowel sounds positive.  Extremities: No clubbing, cyanosis, or edema. Pulses are 2+  Skin: Clean and intact without signs of breakdown  Neuro: Pt follow simple commands and occasionally answers. Doesn't initiate conversation. Very quiet overall.. Cranial nerves 2-12 are grossly intact. Sensory exam is normal. Reflexes are 2+ in all 4's. Fine motor coordination is fair. No tremors. Motor function is grossly 5/5 in all 4's. She stood with out assistance and walked down hall with slightly wide based gait. Musculoskeletal: Full ROM, No pain with ROM or  PROM in the neck, trunk, or extremities. Posture appropriate  Psych: Pt's affect is flat  .   Assessment & Plan:   1. subarachnoid hemorrhage with small infarct left internal capsule: Status post IVC placement 08/18/2011 with coiling of aneurysm 08/19/2011 and VP shunt placement 09/02/2011  2. Depression- reactive- improved  3. Seizure disorder related to the above.  4. Recurrent UTI    Plan:  1. Continue with therapy. I would like for them to come out to the home. 2. Spent extensive time reviewing medications and how to organize them. 3. Continue on ritalin for initiation and arousal for now. We can wean this spring.  4. Described BP management. I would check bp against her skin, not through a shirt. If it remains consistently elevated, I would like to raise her baseline medications. 5. . Follow up with me in about 2 months. 30 minutes of face to face patient care time were spent during this visit. All questions were encouraged and answered.

## 2012-02-22 ENCOUNTER — Telehealth: Payer: Self-pay

## 2012-02-22 NOTE — Telephone Encounter (Signed)
Patients blood pressure is increased to 186/100, 170/95.  Patient is vomiting and throwing up.  Advised them to take patient to ER.

## 2012-02-23 ENCOUNTER — Telehealth: Payer: Self-pay

## 2012-02-23 MED ORDER — LEVETIRACETAM 500 MG PO TABS: 500.0000 mg | ORAL_TABLET | Freq: Two times a day (BID) | ORAL | Status: AC

## 2012-02-23 NOTE — Telephone Encounter (Signed)
Patients husband called requesting anti seizure med.  Keppra refilled patients husband informed.

## 2012-04-12 ENCOUNTER — Encounter: Payer: Self-pay | Admitting: Cardiology

## 2012-04-12 ENCOUNTER — Ambulatory Visit (INDEPENDENT_AMBULATORY_CARE_PROVIDER_SITE_OTHER): Payer: Medicaid Other | Admitting: Cardiology

## 2012-04-12 VITALS — BP 147/85 | HR 67 | Ht 65.0 in | Wt 195.0 lb

## 2012-04-12 DIAGNOSIS — I10 Essential (primary) hypertension: Secondary | ICD-10-CM

## 2012-04-12 DIAGNOSIS — I2699 Other pulmonary embolism without acute cor pulmonale: Secondary | ICD-10-CM

## 2012-04-12 NOTE — Patient Instructions (Addendum)
Your physician recommends that you schedule a follow-up appointment as needed.  Your physician has recommended you make the following change in your medication: Increase your hydralazine 25 mg to three times daily. Take your first dose at 12 noon, second dose @5 :00 pm and third dose @10 :00 pm. All other medications will remain the same.  Please follow up with Dr. Sherril Croon.

## 2012-04-12 NOTE — Assessment & Plan Note (Signed)
Reviewed blood pressure checks and antihypertensive regimen. Patient's husband indicates that they are not using PRN  clonidine on a regular basis at all. Most of her significant elevation in blood pressure  is in the morning before she takes her medications. Would continue current doses of labetalol and Norvasc, taking a.m. medicines at 8 and p.m. medicines at 8. Agree with use of hydralazine 25 mg, however would use this 3 times a day. We decided to try 12 PM, 5 PM, and 10-11 PM. This may help provide further blood pressure reduction and reduce her early-morning hypertension. I am also concerned that she might have undiagnosed obstructive sleep apnea as noted above. Recommend to Dr. Sherril Croon a referral for sleep study. No further cardiac workup at this point. We can see her back if additional assistance is needed.

## 2012-04-12 NOTE — Assessment & Plan Note (Signed)
Status post IVC filter, and on Coumadin followed by Dr. Sherril Croon.

## 2012-04-12 NOTE — Progress Notes (Signed)
Clinical Summary [name] is a medically complex 62 y.o.female referred for cardiology consultation by Dr. Sherril Croon for assistance with management with hypertension control.  Record review finds previous cardiac evaluation by Dr. Tresa Endo with [facility] in September of 2013 in the setting of mildly elevated troponin levels during acute hospitalization with subarachnoid hemorrhage and stroke. Echocardiogram at that time indicated mild LVH with LVEF 55-65%, no description of wall motion abnormality, grade 1 diastolic dysfunction, PASP 57 mmHg. Myoview was performed which did not report any active ischemia or scar, however provided somewhat discordant information with LVEF of 15% and lateral hypokinesis. It appears that she was managed medically.  Also note that she was diagnosed with multiple pulmonary emboli bilaterally in October of 2013.  She was treated with Lovenox initially, ultimately transitioned to Coumadin which is followed by Dr. Sherril Croon.  She is here with her husband. 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 She speaks without hesitation and describes her situation. Not particularly active at home.  We reviewed home blood pressure checks. Patient's husband keeps fairly meticulous records. Most of her significant hypertension is noted in the morning before she takes her medications. Most recent addition to her regimen is hydralazine 25 mg twice daily. She staggers this with other a.m. and p.m. antihypertensives.  I spoke with the patient and her husband about the possibility of undiagnosed obstructive sleep apnea. He does confirm that she snores and has brief apneic episodes at night. She naps sometimes during the day.   Allergies  Allergen Reactions  . Lactose Intolerance (Gi) Other (See Comments)    G.I. Upset  . Penicillins     Doesn't remember what happens    Current Outpatient Prescriptions  Medication Sig Dispense  Refill  . allopurinol (ZYLOPRIM) 150 mg TABS Take 150 mg by mouth daily.      Marland Kitchen amLODipine (NORVASC) 10 MG tablet Take 1 tablet (10 mg total) by mouth daily.  30 tablet  0  . citalopram (CELEXA) 20 MG tablet Take 20 mg by mouth daily.      . cloNIDine (CATAPRES) 0.2 MG tablet Take 1 tablet (0.2 mg total) by mouth every 2 (two) hours as needed (systolic BP > 160 or diastolic > 110).      . docusate sodium (COLACE) 100 MG capsule Take 100 mg by mouth 2 (two) times daily.      . ferrous sulfate 325 (65 FE) MG tablet Take 325 mg by mouth 3 (three) times daily with meals.       . hydrALAZINE (APRESOLINE) 25 MG tablet Take 25 mg by mouth 3 (three) times daily. Take your first dose at 12 noon, second dose @5 :00 pm and third dose @10 :00 pm      . labetalol (NORMODYNE) 200 MG tablet Take 200 mg by mouth 3 (three) times daily.      Marland Kitchen levETIRAcetam (KEPPRA) 500 MG tablet Take 1 tablet (500 mg total) by mouth 2 (two) times daily.  60 tablet  3  . methylphenidate (RITALIN) 5 MG tablet Take 1 tablet (5 mg total) by mouth 2 (two) times daily. Morning and noon  60 tablet  0  . mirtazapine (REMERON) 15 MG tablet Take 7.5 mg by mouth at bedtime.      . pantoprazole (PROTONIX) 40 MG tablet Take 40 mg by mouth daily.      . potassium chloride SA (K-DUR,KLOR-CON) 20 MEQ tablet Take 40 mEq by mouth  daily.      . warfarin (COUMADIN) 2.5 MG tablet Take 2.5 mg by mouth daily. Tuesday take 1 tablet all other days take 2 tablets.       No current facility-administered medications for this visit.   Facility-Administered Medications Ordered in Other Visits  Medication Dose Route Frequency Provider Last Rate Last Dose  . sodium chloride 0.9 % injection 10-40 mL  10-40 mL Intracatheter PRN Maeola Harman, MD   20 mL at 09/09/11 1308    Past Medical History  Diagnosis Date  . Essential hypertension, benign   . Stroke   . PE (pulmonary embolism)     Status post IVC placement  . Seizure disorder   . SAH (subarachnoid  hemorrhage) 08/16/11    Coiling of ACA aneurysm, VP shunt  . Depression   . Type 2 diabetes mellitus   . Esophageal reflux   . Attention deficit disorder of childhood with hyperactivity   . Recurrent UTI     Past Surgical History  Procedure Laterality Date  . Abdominal hysterectomy    . Ventriculoperitoneal shunt  09/02/2011    Procedure: SHUNT INSERTION VENTRICULAR-PERITONEAL;  Surgeon: Maeola Harman, MD;  Location: [facility];  Service: Neurosurgery;  Laterality: Right;  . Aneurysm coiling  08/16/11    Family History  Problem Relation Age of Onset  . Hypertension      Social History [name] reports that she has never smoked. She does not have any smokeless tobacco history on file. [name] reports that she does not drink alcohol.  Review of Systems No headaches or visual changes. Stable appetite. No orthopnea or PND. No edema. Otherwise negative.  Physical Examination Filed Vitals:   04/12/12 1436  BP: 147/85  Pulse: 67   Filed Weights   04/12/12 1436  Weight: 195 lb (88.451 kg)   Overweight woman in no acute distress. HEENT: Conjunctiva and lids normal, oropharynx clear. Neck: Supple, no elevated JVP or carotid bruits, no thyromegaly. Lungs: Clear to auscultation, diminished, nonlabored breathing at rest. Cardiac: Regular rate and rhythm, no S3, soft systolic murmur, no pericardial rub. Abdomen: Soft, nontender, bowel sounds present. Extremities: Trace edema, distal pulses 1-2+. Skin: Warm and dry. Musculoskeletal: No kyphosis. Neuropsychiatric: Alert and oriented x3, affect grossly appropriate.   Problem List and Plan   Essential hypertension, benign Reviewed blood pressure checks and antihypertensive regimen. Patient's husband indicates that they are not using PRN  clonidine on a regular basis at all. Most of her significant elevation in blood pressure  is in the morning before she takes her medications. Would continue current doses of labetalol and  Norvasc, taking a.m. medicines at 8 and p.m. medicines at 8. Agree with use of hydralazine 25 mg, however would use this 3 times a day. We decided to try 12 PM, 5 PM, and 10-11 PM. This may help provide further blood pressure reduction and reduce her early-morning hypertension. I am also concerned that she might have undiagnosed obstructive sleep apnea as noted above. Recommend to Dr. Sherril Croon a referral for sleep study. No further cardiac workup at this point. We can see her back if additional assistance is needed.  Pulmonary embolism Status post IVC filter, and on Coumadin followed by Dr. Sherril Croon.    Jonelle Sidle, M.D., F.A.C.C.

## 2012-04-14 ENCOUNTER — Encounter (HOSPITAL_COMMUNITY): Payer: Self-pay | Admitting: *Deleted

## 2012-04-14 ENCOUNTER — Emergency Department (HOSPITAL_COMMUNITY)
Admission: EM | Admit: 2012-04-14 | Discharge: 2012-04-14 | Disposition: A | Discharge status: Home / Self Care | Payer: Medicaid Other | Attending: Emergency Medicine | Admitting: Emergency Medicine

## 2012-04-14 DIAGNOSIS — I10 Essential (primary) hypertension: Secondary | ICD-10-CM | POA: Insufficient documentation

## 2012-04-14 DIAGNOSIS — Z8673 Personal history of transient ischemic attack (TIA), and cerebral infarction without residual deficits: Secondary | ICD-10-CM | POA: Insufficient documentation

## 2012-04-14 DIAGNOSIS — F909 Attention-deficit hyperactivity disorder, unspecified type: Secondary | ICD-10-CM | POA: Insufficient documentation

## 2012-04-14 DIAGNOSIS — Z7901 Long term (current) use of anticoagulants: Secondary | ICD-10-CM | POA: Insufficient documentation

## 2012-04-14 DIAGNOSIS — Z86711 Personal history of pulmonary embolism: Secondary | ICD-10-CM | POA: Insufficient documentation

## 2012-04-14 DIAGNOSIS — G40909 Epilepsy, unspecified, not intractable, without status epilepticus: Secondary | ICD-10-CM | POA: Insufficient documentation

## 2012-04-14 DIAGNOSIS — Z79899 Other long term (current) drug therapy: Secondary | ICD-10-CM | POA: Insufficient documentation

## 2012-04-14 DIAGNOSIS — Z87440 Personal history of urinary (tract) infections: Secondary | ICD-10-CM | POA: Insufficient documentation

## 2012-04-14 DIAGNOSIS — E119 Type 2 diabetes mellitus without complications: Secondary | ICD-10-CM | POA: Insufficient documentation

## 2012-04-14 DIAGNOSIS — F3289 Other specified depressive episodes: Secondary | ICD-10-CM | POA: Insufficient documentation

## 2012-04-14 DIAGNOSIS — Z8679 Personal history of other diseases of the circulatory system: Secondary | ICD-10-CM | POA: Insufficient documentation

## 2012-04-14 DIAGNOSIS — K219 Gastro-esophageal reflux disease without esophagitis: Secondary | ICD-10-CM | POA: Insufficient documentation

## 2012-04-14 DIAGNOSIS — F329 Major depressive disorder, single episode, unspecified: Secondary | ICD-10-CM | POA: Insufficient documentation

## 2012-04-14 LAB — PROTIME-INR
INR: 1.7 — ABNORMAL HIGH (ref 0.00–1.49)
Prothrombin Time: 19.4 seconds — ABNORMAL HIGH (ref 11.6–15.2)

## 2012-04-14 LAB — POCT I-STAT, CHEM 8
BUN: 24 mg/dL — ABNORMAL HIGH (ref 6–23)
Chloride: 108 mEq/L (ref 96–112)
Creatinine, Ser: 1 mg/dL (ref 0.50–1.10)
Sodium: 148 mEq/L — ABNORMAL HIGH (ref 135–145)

## 2012-04-14 NOTE — ED Notes (Signed)
Pt in from home via POV, pt family states, "She has been having high blood pressure for 2 mths now & it gets worse when she sleeps. I take it in the morning, noon & at night. She had a stroke & an aneurysm. She takes 2 medicines."

## 2012-04-14 NOTE — ED Provider Notes (Signed)
History    CSN: [id number] Arrival date & time 04/14/12  7846 First MD Initiated Contact with Patient 04/14/12 0840     Chief Complaint  Patient presents with  . Hypertension   HPI Patient presents to the emergency room because of issues with high blood pressure.  The symptoms have been ongoing for the last 2 months.  The patient has a history of stroke as well as subarachnoid hemorrhage.  The family monitors her blood pressure closely. The patient has been seeing her primary Dr. She was started on hydralazine recently in this past Friday in fact had her hydralazine dose increased to 3 times daily.  Last night or today still noted blood pressures were elevated with diastolics in the 100s. The family was concerned about this and decided to bring her in the emergency room for further evaluation.  The patient denies any acute symptoms. She does not have any complaints of headache, chest pain, numbness, weakness, vomiting or diarrhea. She has had some fast over the last couple of months but the patient states she has had some instability ever since she had the stroke aneurysm. She has not noticed any particular changes with that. Past Medical History  Diagnosis Date  . Essential hypertension, benign   . Stroke   . PE (pulmonary embolism)     Status post IVC placement  . Seizure disorder   . SAH (subarachnoid hemorrhage) 08/16/11    Coiling of ACA aneurysm, VP shunt  . Depression   . Type 2 diabetes mellitus   . Esophageal reflux   . Attention deficit disorder of childhood with hyperactivity   . Recurrent UTI     Past Surgical History  Procedure Laterality Date  . Abdominal hysterectomy    . Ventriculoperitoneal shunt  09/02/2011    Procedure: SHUNT INSERTION VENTRICULAR-PERITONEAL;  Surgeon: Maeola Harman, MD;  Location: [facility];  Service: Neurosurgery;  Laterality: Right;  . Aneurysm coiling  08/16/11    Family History  Problem Relation Age of Onset  . Hypertension      History   Substance Use Topics  . Smoking status: Never Smoker   . Smokeless tobacco: Not on file  . Alcohol Use: No    OB History   Grav Para Term Preterm Abortions TAB SAB Ect Mult Living                  Review of Systems  All other systems reviewed and are negative.    Allergies  Lactose intolerance (gi) and Penicillins  Home Medications   Current Outpatient Rx  Name  Route  Sig  Dispense  Refill  . allopurinol (ZYLOPRIM) 300 MG tablet   Oral   Take 300 mg by mouth every morning.         Marland Kitchen amLODipine (NORVASC) 10 MG tablet   Oral   Take 1 tablet (10 mg total) by mouth daily.   30 tablet   0   . citalopram (CELEXA) 20 MG tablet   Oral   Take 20 mg by mouth daily.         Marland Kitchen docusate sodium (COLACE) 100 MG capsule   Oral   Take 100 mg by mouth 2 (two) times daily.         . ferrous sulfate 325 (65 FE) MG tablet   Oral   Take 325 mg by mouth 3 (three) times daily with meals.          . hydrALAZINE (APRESOLINE) 25 MG tablet  Oral   Take 25 mg by mouth 3 (three) times daily. Take your first dose at 12 noon, second dose @5 :00 pm and third dose @10 :00 pm         . labetalol (NORMODYNE) 200 MG tablet   Oral   Take 400 mg by mouth 2 (two) times daily. 400mg  every morning , and 400mg  every night         . levETIRAcetam (KEPPRA) 500 MG tablet   Oral   Take 1 tablet (500 mg total) by mouth 2 (two) times daily.   60 tablet   3   . methylphenidate (RITALIN) 5 MG tablet   Oral   Take 1 tablet (5 mg total) by mouth 2 (two) times daily. Morning and noon   60 tablet   0   . mirtazapine (REMERON) 15 MG tablet   Oral   Take 15 mg by mouth at bedtime.         . pantoprazole (PROTONIX) 40 MG tablet   Oral   Take 40 mg by mouth daily.         . potassium chloride SA (K-DUR,KLOR-CON) 20 MEQ tablet   Oral   Take 40 mEq by mouth daily.         Marland Kitchen warfarin (COUMADIN) 2.5 MG tablet   Oral   Take 2.5 mg by mouth daily. Tuesday take 1 tablet all other  days take 2 tablets.           BP 150/89  Pulse 79  Temp(Src) 98 F (36.7 C) (Oral)  Resp 18  SpO2 99%  Physical Exam  Nursing note and vitals reviewed. Constitutional: She appears well-developed and well-nourished. No distress.  HENT:  Head: Normocephalic and atraumatic.  Right Ear: External ear normal.  Left Ear: External ear normal.  Eyes: Conjunctivae are normal. Right eye exhibits no discharge. Left eye exhibits no discharge. No scleral icterus.  Neck: Neck supple. No tracheal deviation present.  Cardiovascular: Normal rate, regular rhythm and intact distal pulses.   Pulmonary/Chest: Effort normal and breath sounds normal. No stridor. No respiratory distress. She has no wheezes. She has no rales.  Abdominal: Soft. Bowel sounds are normal. She exhibits no distension. There is no tenderness. There is no rebound and no guarding.  Musculoskeletal: She exhibits no edema and no tenderness.  Neurological: She is alert. She has normal strength. No sensory deficit. Cranial nerve deficit:  no gross defecits noted. She exhibits normal muscle tone. She displays no seizure activity. Coordination normal.  Skin: Skin is warm and dry. No rash noted.  Psychiatric: She has a normal mood and affect.    ED Course  Procedures (including critical care time)  Labs Reviewed  PROTIME-INR - Abnormal; Notable for the following:    Prothrombin Time 19.4 (*)    INR 1.70 (*)    All other components within normal limits  POCT I-STAT, CHEM 8 - Abnormal; Notable for the following:    Sodium 148 (*)    BUN 24 (*)    Glucose, Bld 124 (*)    Hemoglobin 11.2 (*)    HCT 33.0 (*)    All other components within normal limits   No results found.  1. Hypertension     MDM  The patient has a symptomatic hypertension. Her blood pressure is not significantly elevated today. Her primary doctor just adjusted her medication dosage on Friday. I think it is reasonable at this time to continue with that  management.  Patient also requested  to have an INR checked today. It is subtherapeutic. We'll have her take an additional dose of 2.5 mg Coumadin and I suggested she followup with her primary Dr. next week        Celene Kras, MD 04/14/12 1143

## 2012-04-14 NOTE — ED Notes (Signed)
Pt undressed, in gown, on monitor, continuous pulse oximetry and blood pressure cuff; family at bedside 

## 2012-04-14 NOTE — ED Notes (Signed)
Knapp, MD at bedside.  

## 2012-04-17 ENCOUNTER — Other Ambulatory Visit: Payer: Self-pay | Admitting: *Deleted

## 2012-04-17 ENCOUNTER — Encounter: Payer: Self-pay | Admitting: *Deleted

## 2012-04-17 MED ORDER — HYDRALAZINE HCL 25 MG PO TABS: 25.0000 mg | ORAL_TABLET | Freq: Three times a day (TID) | ORAL | Status: DC

## 2012-04-27 ENCOUNTER — Other Ambulatory Visit: Payer: Self-pay | Admitting: Cardiology

## 2012-04-27 NOTE — Telephone Encounter (Signed)
rx sent to pharmacy by e-script  

## 2012-05-18 ENCOUNTER — Encounter: Payer: Self-pay | Admitting: Physical Medicine & Rehabilitation

## 2012-05-18 ENCOUNTER — Encounter
Payer: BC Managed Care – PPO | Attending: Physical Medicine & Rehabilitation | Admitting: Physical Medicine & Rehabilitation

## 2012-05-18 VITALS — BP 118/63 | HR 67 | Resp 16 | Ht 65.0 in | Wt 205.0 lb

## 2012-05-18 DIAGNOSIS — M7582 Other shoulder lesions, left shoulder: Secondary | ICD-10-CM

## 2012-05-18 DIAGNOSIS — I10 Essential (primary) hypertension: Secondary | ICD-10-CM

## 2012-05-18 DIAGNOSIS — I609 Nontraumatic subarachnoid hemorrhage, unspecified: Secondary | ICD-10-CM

## 2012-05-18 DIAGNOSIS — G9340 Encephalopathy, unspecified: Secondary | ICD-10-CM | POA: Insufficient documentation

## 2012-05-18 DIAGNOSIS — E119 Type 2 diabetes mellitus without complications: Secondary | ICD-10-CM

## 2012-05-18 DIAGNOSIS — M719 Bursopathy, unspecified: Secondary | ICD-10-CM

## 2012-05-18 NOTE — Patient Instructions (Signed)
STOP THE RITALIN.  STAY ACTIVE!!!   Impingement Syndrome, Rotator Cuff, Bursitis with Rehab Impingement syndrome is a condition that involves inflammation of the tendons of the rotator cuff and the subacromial bursa, that causes pain in the shoulder. The rotator cuff consists of four tendons and muscles that control much of the shoulder and upper arm function. The subacromial bursa is a fluid filled sac that helps reduce friction between the rotator cuff and one of the bones of the shoulder (acromion). Impingement syndrome is usually an overuse injury that causes swelling of the bursa (bursitis), swelling of the tendon (tendonitis), and/or a tear of the tendon (strain). Strains are classified into three categories. Grade 1 strains cause pain, but the tendon is not lengthened. Grade 2 strains include a lengthened ligament, due to the ligament being stretched or partially ruptured. With grade 2 strains there is still function, although the function may be decreased. Grade 3 strains include a complete tear of the tendon or muscle, and function is usually impaired. SYMPTOMS   Pain around the shoulder, often at the outer portion of the upper arm.  Pain that gets worse with shoulder function, especially when reaching overhead or lifting.  Sometimes, aching when not using the arm.  Pain that wakes you up at night.  Sometimes, tenderness, swelling, warmth, or redness over the affected area.  Loss of strength.  Limited motion of the shoulder, especially reaching behind the back (to the back pocket or to unhook bra) or across your body.  Crackling sound (crepitation) when moving the arm.  Biceps tendon pain and inflammation (in the front of the shoulder). Worse when bending the elbow or lifting. CAUSES  Impingement syndrome is often an overuse injury, in which chronic (repetitive) motions cause the tendons or bursa to become inflamed. A strain occurs when a force is paced on the tendon or muscle  that is greater than it can withstand. Common mechanisms of injury include: Stress from sudden increase in duration, frequency, or intensity of training.  Direct hit (trauma) to the shoulder.  Aging, erosion of the tendon with normal use.  Bony bump on shoulder (acromial spur). RISK INCREASES WITH:  Contact sports (football, wrestling, boxing).  Throwing sports (baseball, tennis, volleyball).  Weightlifting and bodybuilding.  Heavy labor.  Previous injury to the rotator cuff, including impingement.  Poor shoulder strength and flexibility.  Failure to warm up properly before activity.  Inadequate protective equipment.  Old age.  Bony bump on shoulder (acromial spur). PREVENTION   Warm up and stretch properly before activity.  Allow for adequate recovery between workouts.  Maintain physical fitness:  Strength, flexibility, and endurance.  Cardiovascular fitness.  Learn and use proper exercise technique. PROGNOSIS  If treated properly, impingement syndrome usually goes away within 6 weeks. Sometimes surgery is required.  RELATED COMPLICATIONS   Longer healing time if not properly treated, or if not given enough time to heal.  Recurring symptoms, that result in a chronic condition.  Shoulder stiffness, frozen shoulder, or loss of motion.  Rotator cuff tendon tear.  Recurring symptoms, especially if activity is resumed too soon, with overuse, with a direct blow, or when using poor technique. TREATMENT  Treatment first involves the use of ice and medicine, to reduce pain and inflammation. The use of strengthening and stretching exercises may help reduce pain with activity. These exercises may be performed at home or with a therapist. If non-surgical treatment is unsuccessful after more than 6 months, surgery may be advised. After surgery and  rehabilitation, activity is usually possible in 3 months.  MEDICATION  If pain medicine is needed, nonsteroidal  anti-inflammatory medicines (aspirin and ibuprofen), or other minor pain relievers (acetaminophen), are often advised.  Do not take pain medicine for 7 days before surgery.  Prescription pain relievers may be given, if your caregiver thinks they are needed. Use only as directed and only as much as you need.  Corticosteroid injections may be given by your caregiver. These injections should be reserved for the most serious cases, because they may only be given a certain number of times. HEAT AND COLD  Cold treatment (icing) should be applied for 10 to 15 minutes every 2 to 3 hours for inflammation and pain, and immediately after activity that aggravates your symptoms. Use ice packs or an ice massage.  Heat treatment may be used before performing stretching and strengthening activities prescribed by your caregiver, physical therapist, or athletic trainer. Use a heat pack or a warm water soak. SEEK MEDICAL CARE IF:   Symptoms get worse or do not improve in 4 to 6 weeks, despite treatment.  New, unexplained symptoms develop. (Drugs used in treatment may produce side effects.) EXERCISES  RANGE OF MOTION (ROM) AND STRETCHING EXERCISES - Impingement Syndrome (Rotator Cuff  Tendinitis, Bursitis) These exercises may help you when beginning to rehabilitate your injury. Your symptoms may go away with or without further involvement from your physician, physical therapist or athletic trainer. While completing these exercises, remember:   Restoring tissue flexibility helps normal motion to return to the joints. This allows healthier, less painful movement and activity.  An effective stretch should be held for at least 30 seconds.  A stretch should never be painful. You should only feel a gentle lengthening or release in the stretched tissue. STRETCH  Flexion, Standing  Stand with good posture. With an underhand grip on your right / left hand, and an overhand grip on the opposite hand, grasp a  broomstick or cane so that your hands are a little more than shoulder width apart.  Keeping your right / left elbow straight and shoulder muscles relaxed, push the stick with your opposite hand, to raise your right / left arm in front of your body and then overhead. Raise your arm until you feel a stretch in your right / left shoulder, but before you have increased shoulder pain.  Try to avoid shrugging your right / left shoulder as your arm rises, by keeping your shoulder blade tucked down and toward your mid-back spine. Hold for __________ seconds.  Slowly return to the starting position. Repeat __________ times. Complete this exercise __________ times per day. STRETCH  Abduction, Supine  Lie on your back. With an underhand grip on your right / left hand and an overhand grip on the opposite hand, grasp a broomstick or cane so that your hands are a little more than shoulder width apart.  Keeping your right / left elbow straight and your shoulder muscles relaxed, push the stick with your opposite hand, to raise your right / left arm out to the side of your body and then overhead. Raise your arm until you feel a stretch in your right / left shoulder, but before you have increased shoulder pain.  Try to avoid shrugging your right / left shoulder as your arm rises, by keeping your shoulder blade tucked down and toward your mid-back spine. Hold for __________ seconds.  Slowly return to the starting position. Repeat __________ times. Complete this exercise __________ times per day.  ROM  Flexion, Active-Assisted  Lie on your back. You may bend your knees for comfort.  Grasp a broomstick or cane so your hands are about shoulder width apart. Your right / left hand should grip the end of the stick, so that your hand is positioned "thumbs-up," as if you were about to shake hands.  Using your healthy arm to lead, raise your right / left arm overhead, until you feel a gentle stretch in your shoulder.  Hold for __________ seconds.  Use the stick to assist in returning your right / left arm to its starting position. Repeat __________ times. Complete this exercise __________ times per day.  ROM - Internal Rotation, Supine   Lie on your back on a firm surface. Place your right / left elbow about 60 degrees away from your side. Elevate your elbow with a folded towel, so that the elbow and shoulder are the same height.  Using a broomstick or cane and your strong arm, pull your right / left hand toward your body until you feel a gentle stretch, but no increase in your shoulder pain. Keep your shoulder and elbow in place throughout the exercise.  Hold for __________ seconds. Slowly return to the starting position. Repeat __________ times. Complete this exercise __________ times per day. STRETCH - Internal Rotation  Place your right / left hand behind your back, palm up.  Throw a towel or belt over your opposite shoulder. Grasp the towel with your right / left hand.  While keeping an upright posture, gently pull up on the towel, until you feel a stretch in the front of your right / left shoulder.  Avoid shrugging your right / left shoulder as your arm rises, by keeping your shoulder blade tucked down and toward your mid-back spine.  Hold for __________ seconds. Release the stretch, by lowering your healthy hand. Repeat __________ times. Complete this exercise __________ times per day. ROM - Internal Rotation   Using an underhand grip, grasp a stick behind your back with both hands.  While standing upright with good posture, slide the stick up your back until you feel a mild stretch in the front of your shoulder.  Hold for __________ seconds. Slowly return to your starting position. Repeat __________ times. Complete this exercise __________ times per day.  STRETCH  Posterior Shoulder Capsule   Stand or sit with good posture. Grasp your right / left elbow and draw it across your chest,  keeping it at the same height as your shoulder.  Pull your elbow, so your upper arm comes in closer to your chest. Pull until you feel a gentle stretch in the back of your shoulder.  Hold for __________ seconds. Repeat __________ times. Complete this exercise __________ times per day. STRENGTHENING EXERCISES - Impingement Syndrome (Rotator Cuff Tendinitis, Bursitis) These exercises may help you when beginning to rehabilitate your injury. They may resolve your symptoms with or without further involvement from your physician, physical therapist or athletic trainer. While completing these exercises, remember:  Muscles can gain both the endurance and the strength needed for everyday activities through controlled exercises.  Complete these exercises as instructed by your physician, physical therapist or athletic trainer. Increase the resistance and repetitions only as guided.  You may experience muscle soreness or fatigue, but the pain or discomfort you are trying to eliminate should never worsen during these exercises. If this pain does get worse, stop and make sure you are following the directions exactly. If the pain is still present  after adjustments, discontinue the exercise until you can discuss the trouble with your clinician.  During your recovery, avoid activity or exercises which involve actions that place your injured hand or elbow above your head or behind your back or head. These positions stress the tissues which you are trying to heal. STRENGTH - Scapular Depression and Adduction   With good posture, sit on a firm chair. Support your arms in front of you, with pillows, arm rests, or on a table top. Have your elbows in line with the sides of your body.  Gently draw your shoulder blades down and toward your mid-back spine. Gradually increase the tension, without tensing the muscles along the top of your shoulders and the back of your neck.  Hold for __________ seconds. Slowly release  the tension and relax your muscles completely before starting the next repetition.  After you have practiced this exercise, remove the arm support and complete the exercise in standing as well as sitting position. Repeat __________ times. Complete this exercise __________ times per day.  STRENGTH - Shoulder Abductors, Isometric  With good posture, stand or sit about 4-6 inches from a wall, with your right / left side facing the wall.  Bend your right / left elbow. Gently press your right / left elbow into the wall. Increase the pressure gradually, until you are pressing as hard as you can, without shrugging your shoulder or increasing any shoulder discomfort.  Hold for __________ seconds.  Release the tension slowly. Relax your shoulder muscles completely before you begin the next repetition. Repeat __________ times. Complete this exercise __________ times per day.  STRENGTH - External Rotators, Isometric  Keep your right / left elbow at your side and bend it 90 degrees.  Step into a door frame so that the outside of your right / left wrist can press against the door frame without your upper arm leaving your side.  Gently press your right / left wrist into the door frame, as if you were trying to swing the back of your hand away from your stomach. Gradually increase the tension, until you are pressing as hard as you can, without shrugging your shoulder or increasing any shoulder discomfort.  Hold for __________ seconds.  Release the tension slowly. Relax your shoulder muscles completely before you begin the next repetition. Repeat __________ times. Complete this exercise __________ times per day.  STRENGTH - Supraspinatus   Stand or sit with good posture. Grasp a __________ weight, or an exercise band or tubing, so that your hand is "thumbs-up," like you are shaking hands.  Slowly lift your right / left arm in a "V" away from your thigh, diagonally into the space between your side and  straight ahead. Lift your hand to shoulder height or as far as you can, without increasing any shoulder pain. At first, many people do not lift their hands above shoulder height.  Avoid shrugging your right / left shoulder as your arm rises, by keeping your shoulder blade tucked down and toward your mid-back spine.  Hold for __________ seconds. Control the descent of your hand, as you slowly return to your starting position. Repeat __________ times. Complete this exercise __________ times per day.  STRENGTH - External Rotators  Secure a rubber exercise band or tubing to a fixed object (table, pole) so that it is at the same height as your right / left elbow when you are standing or sitting on a firm surface.  Stand or sit so that the secured  exercise band is at your uninjured side.  Bend your right / left elbow 90 degrees. Place a folded towel or small pillow under your right / left arm, so that your elbow is a few inches away from your side.  Keeping the tension on the exercise band, pull it away from your body, as if pivoting on your elbow. Be sure to keep your body steady, so that the movement is coming only from your rotating shoulder.  Hold for __________ seconds. Release the tension in a controlled manner, as you return to the starting position. Repeat __________ times. Complete this exercise __________ times per day.  STRENGTH - Internal Rotators   Secure a rubber exercise band or tubing to a fixed object (table, pole) so that it is at the same height as your right / left elbow when you are standing or sitting on a firm surface.  Stand or sit so that the secured exercise band is at your right / left side.  Bend your elbow 90 degrees. Place a folded towel or small pillow under your right / left arm so that your elbow is a few inches away from your side.  Keeping the tension on the exercise band, pull it across your body, toward your stomach. Be sure to keep your body steady, so that  the movement is coming only from your rotating shoulder.  Hold for __________ seconds. Release the tension in a controlled manner, as you return to the starting position. Repeat __________ times. Complete this exercise __________ times per day.  STRENGTH - Scapular Protractors, Standing   Stand arms length away from a wall. Place your hands on the wall, keeping your elbows straight.  Begin by dropping your shoulder blades down and toward your mid-back spine.  To strengthen your protractors, keep your shoulder blades down, but slide them forward on your rib cage. It will feel as if you are lifting the back of your rib cage away from the wall. This is a subtle motion and can be challenging to complete. Ask your caregiver for further instruction, if you are not sure you are doing the exercise correctly.  Hold for __________ seconds. Slowly return to the starting position, resting the muscles completely before starting the next repetition. Repeat __________ times. Complete this exercise __________ times per day. STRENGTH - Scapular Protractors, Supine  Lie on your back on a firm surface. Extend your right / left arm straight into the air while holding a __________ weight in your hand.  Keeping your head and back in place, lift your shoulder off the floor.  Hold for __________ seconds. Slowly return to the starting position, and allow your muscles to relax completely before starting the next repetition. Repeat __________ times. Complete this exercise __________ times per day. STRENGTH - Scapular Protractors, Quadruped  Get onto your hands and knees, with your shoulders directly over your hands (or as close as you can be, comfortably).  Keeping your elbows locked, lift the back of your rib cage up into your shoulder blades, so your mid-back rounds out. Keep your neck muscles relaxed.  Hold this position for __________ seconds. Slowly return to the starting position and allow your muscles to  relax completely before starting the next repetition. Repeat __________ times. Complete this exercise __________ times per day.  STRENGTH - Scapular Retractors  Secure a rubber exercise band or tubing to a fixed object (table, pole), so that it is at the height of your shoulders when you are either standing, or  sitting on a firm armless chair.  With a palm down grip, grasp an end of the band in each hand. Straighten your elbows and lift your hands straight in front of you, at shoulder height. Step back, away from the secured end of the band, until it becomes tense.  Squeezing your shoulder blades together, draw your elbows back toward your sides, as you bend them. Keep your upper arms lifted away from your body throughout the exercise.  Hold for __________ seconds. Slowly ease the tension on the band, as you reverse the directions and return to the starting position. Repeat __________ times. Complete this exercise __________ times per day. STRENGTH - Shoulder Extensors   Secure a rubber exercise band or tubing to a fixed object (table, pole) so that it is at the height of your shoulders when you are either standing, or sitting on a firm armless chair.  With a thumbs-up grip, grasp an end of the band in each hand. Straighten your elbows and lift your hands straight in front of you, at shoulder height. Step back, away from the secured end of the band, until it becomes tense.  Squeezing your shoulder blades together, pull your hands down to the sides of your thighs. Do not allow your hands to go behind you.  Hold for __________ seconds. Slowly ease the tension on the band, as you reverse the directions and return to the starting position. Repeat __________ times. Complete this exercise __________ times per day.  STRENGTH - Scapular Retractors and External Rotators   Secure a rubber exercise band or tubing to a fixed object (table, pole) so that it is at the height as your shoulders, when you are  either standing, or sitting on a firm armless chair.  With a palm down grip, grasp an end of the band in each hand. Bend your elbows 90 degrees and lift your elbows to shoulder height, at your sides. Step back, away from the secured end of the band, until it becomes tense.  Squeezing your shoulder blades together, rotate your shoulders so that your upper arms and elbows remain stationary, but your fists travel upward to head height.  Hold for __________ seconds. Slowly ease the tension on the band, as you reverse the directions and return to the starting position. Repeat __________ times. Complete this exercise __________ times per day.  STRENGTH - Scapular Retractors and External Rotators, Rowing   Secure a rubber exercise band or tubing to a fixed object (table, pole) so that it is at the height of your shoulders, when you are either standing, or sitting on a firm armless chair.  With a palm down grip, grasp an end of the band in each hand. Straighten your elbows and lift your hands straight in front of you, at shoulder height. Step back, away from the secured end of the band, until it becomes tense.  Step 1: Squeeze your shoulder blades together. Bending your elbows, draw your hands to your chest, as if you are rowing a boat. At the end of this motion, your hands and elbow should be at shoulder height and your elbows should be out to your sides.  Step 2: Rotate your shoulders, to raise your hands above your head. Your forearms should be vertical and your upper arms should be horizontal.  Hold for __________ seconds. Slowly ease the tension on the band, as you reverse the directions and return to the starting position. Repeat __________ times. Complete this exercise __________ times per day.  STRENGTH  Scapular Depressors  Find a sturdy chair without wheels, such as a dining room chair.  Keeping your feet on the floor, and your hands on the chair arms, lift your bottom up from the seat, and  lock your elbows.  Keeping your elbows straight, allow gravity to pull your body weight down. Your shoulders will rise toward your ears.  Raise your body against gravity by drawing your shoulder blades down your back, shortening the distance between your shoulders and ears. Although your feet should always maintain contact with the floor, your feet should progressively support less body weight, as you get stronger.  Hold for __________ seconds. In a controlled and slow manner, lower your body weight to begin the next repetition. Repeat __________ times. Complete this exercise __________ times per day.  Document Released: 12/20/2004 Document Revised: 03/14/2011 Document Reviewed: 04/03/2008 [facility] Patient Information 2013 [place], [place].

## 2012-05-18 NOTE — Progress Notes (Signed)
Subjective:    Patient ID: [name], female    DOB: 06/12/1950, 62 y.o.   MRN: [id number]  HPI  [name] is back regarding her TBI,SAH. She has been at home for the last 3 months with her husband. She is cooking breakfast daily.   She is attending therapies at [facility] to work on her balance. She fell once getting up from the toilet. She may lose her balance once in awhile around the house as well.   She has some pain in her left side, particularly in her left shoulder. The arm only hurts when she "uses the left arm" for things around the house, self-care. She does feel it's getting better, and she can do more before it hurts.    Pain Inventory Average Pain 6 Pain Right Now 3 My pain is intermittent and dull  In the last 24 hours, has pain interfered with the following? General activity na Relation with others na Enjoyment of life na What TIME of day is your pain at its worst? constant Sleep (in general) Good  Pain is worse with: some activites Pain improves with: therapy/exercise and medication Relief from Meds: 0  Mobility walk without assistance how many minutes can you walk? 10-15 ability to climb steps?  yes do you drive?  no Do you have any goals in this area?  yes  Function retired I need assistance with the following:  meal prep, household duties and shopping  Neuro/Psych weakness trouble walking  Prior Studies Any changes since last visit?  no  Physicians involved in your care Any changes since last visit?  no   Family History  Problem Relation Age of Onset  . Hypertension     History   Social History  . Marital Status: Married    Spouse Name: N/A    Number of Children: N/A  . Years of Education: N/A   Occupational History  . Disability     Retired from [employer] and [employer]   Social History Main Topics  . Smoking status: Never Smoker   . Smokeless tobacco: None  . Alcohol Use: No  . Drug Use: No  . Sexually Active:     Other Topics Concern  . None   Social History Narrative  . None   Past Surgical History  Procedure Laterality Date  . Abdominal hysterectomy    . Ventriculoperitoneal shunt  09/02/2011    Procedure: SHUNT INSERTION VENTRICULAR-PERITONEAL;  Surgeon: Maeola Harman, MD;  Location: [facility];  Service: Neurosurgery;  Laterality: Right;  . Aneurysm coiling  08/16/11   Past Medical History  Diagnosis Date  . Essential hypertension, benign   . Stroke   . PE (pulmonary embolism)     Status post IVC placement  . Seizure disorder   . SAH (subarachnoid hemorrhage) 08/16/11    Coiling of ACA aneurysm, VP shunt  . Depression   . Type 2 diabetes mellitus   . Esophageal reflux   . Attention deficit disorder of childhood with hyperactivity   . Recurrent UTI    BP 118/63  Pulse 67  Resp 16  Ht 5\' 5"  (1.651 m)  Wt 205 lb (92.987 kg)  BMI 34.11 kg/m2  SpO2 97%     Review of Systems  Respiratory: Positive for apnea and cough.   Musculoskeletal: Positive for gait problem.  Neurological: Positive for weakness.  All other systems reviewed and are negative.       Objective:   Physical Exam General: Alert and  oriented to name, date with extra time with cueing. Still flat.  HEENT: Head is normocephalic, atraumatic, PERRLA, EOMI, sclera anicteric, oral mucosa pink and moist, dentition intact, ext ear canals clear,  Neck: Supple without JVD or lymphadenopathy  Heart: Reg rate and rhythm. No murmurs rubs or gallops  Chest: CTA bilaterally without wheezes, rales, or rhonchi; no distress  Abdomen: Soft, non-tender, non-distended, bowel sounds positive.  Extremities: No clubbing, cyanosis, or edema. Pulses are 2+  Skin: Clean and intact without signs of breakdown  Neuro: flat, slow to initiate. Reasonable insight and awareness. Fairly good balance, tending to drift a little to the right. Slightly off balance when changing directions.. Cranial nerves 2-12 are grossly intact. Sensory exam  is normal. Reflexes are 2+ in all 4's. Fine motor coordination is fair. No tremors. Motor function is grossly 5/5 in all 4's. Musculoskeletal: left RTC impingement sign. Positive apprehension sign. Right shoulder, biceps tendons non-tender Psych: Pt's affect is flat .    Assessment & Plan:   1. subarachnoid hemorrhage with small infarct left internal capsule: Status post IVC placement 08/18/2011 with coiling of aneurysm 08/19/2011 and VP shunt placement 09/02/2011  2. Depression- reactive- improved  3. Seizure disorder related to the above.  4. Left rotator cuff syndrome Plan:  1. Left rotator cuff pathology and exercises were reviewed.   2. Tylenol may be used for pain 500mg  q6 prn.   3. DC ritalin  4. BP mgt per PCP.  5. Follow up with me in 6 month

## 2012-06-21 ENCOUNTER — Other Ambulatory Visit: Payer: Self-pay | Admitting: Radiology

## 2012-06-21 ENCOUNTER — Other Ambulatory Visit (HOSPITAL_COMMUNITY): Payer: Self-pay | Admitting: Interventional Radiology

## 2012-06-21 DIAGNOSIS — I609 Nontraumatic subarachnoid hemorrhage, unspecified: Secondary | ICD-10-CM

## 2012-06-21 DIAGNOSIS — I729 Aneurysm of unspecified site: Secondary | ICD-10-CM

## 2012-06-26 ENCOUNTER — Ambulatory Visit (HOSPITAL_COMMUNITY)
Admission: RE | Admit: 2012-06-26 | Discharge: 2012-06-26 | Disposition: A | Discharge status: Home / Self Care | Payer: BC Managed Care – PPO | Source: Ambulatory Visit | Attending: Interventional Radiology | Admitting: Interventional Radiology

## 2012-06-26 ENCOUNTER — Encounter (HOSPITAL_COMMUNITY): Payer: Self-pay

## 2012-06-26 DIAGNOSIS — Z86711 Personal history of pulmonary embolism: Secondary | ICD-10-CM | POA: Insufficient documentation

## 2012-06-26 DIAGNOSIS — E119 Type 2 diabetes mellitus without complications: Secondary | ICD-10-CM | POA: Insufficient documentation

## 2012-06-26 DIAGNOSIS — I10 Essential (primary) hypertension: Secondary | ICD-10-CM | POA: Insufficient documentation

## 2012-06-26 DIAGNOSIS — R51 Headache: Secondary | ICD-10-CM | POA: Insufficient documentation

## 2012-06-26 DIAGNOSIS — F909 Attention-deficit hyperactivity disorder, unspecified type: Secondary | ICD-10-CM | POA: Insufficient documentation

## 2012-06-26 DIAGNOSIS — G40909 Epilepsy, unspecified, not intractable, without status epilepticus: Secondary | ICD-10-CM | POA: Insufficient documentation

## 2012-06-26 DIAGNOSIS — K219 Gastro-esophageal reflux disease without esophagitis: Secondary | ICD-10-CM | POA: Insufficient documentation

## 2012-06-26 DIAGNOSIS — I671 Cerebral aneurysm, nonruptured: Secondary | ICD-10-CM | POA: Insufficient documentation

## 2012-06-26 DIAGNOSIS — I609 Nontraumatic subarachnoid hemorrhage, unspecified: Secondary | ICD-10-CM

## 2012-06-26 DIAGNOSIS — I729 Aneurysm of unspecified site: Secondary | ICD-10-CM

## 2012-06-26 LAB — CBC WITH DIFFERENTIAL/PLATELET
Basophils Absolute: 0 10*3/uL (ref 0.0–0.1)
Basophils Relative: 1 % (ref 0–1)
Eosinophils Relative: 4 % (ref 0–5)
HCT: 34.6 % — ABNORMAL LOW (ref 36.0–46.0)
Hemoglobin: 10.8 g/dL — ABNORMAL LOW (ref 12.0–15.0)
MCHC: 31.2 g/dL (ref 30.0–36.0)
MCV: 86.5 fL (ref 78.0–100.0)
Monocytes Absolute: 0.3 10*3/uL (ref 0.1–1.0)
Monocytes Relative: 8 % (ref 3–12)
RDW: 14.4 % (ref 11.5–15.5)

## 2012-06-26 LAB — BASIC METABOLIC PANEL
BUN: 20 mg/dL (ref 6–23)
CO2: 27 mEq/L (ref 19–32)
Chloride: 107 mEq/L (ref 96–112)
Creatinine, Ser: 1.17 mg/dL — ABNORMAL HIGH (ref 0.50–1.10)
Glucose, Bld: 90 mg/dL (ref 70–99)

## 2012-06-26 MED ORDER — MIDAZOLAM HCL 2 MG/2ML IJ SOLN
INTRAMUSCULAR | Status: AC
  Filled 2012-06-26: qty 4

## 2012-06-26 MED ORDER — FENTANYL CITRATE 0.05 MG/ML IJ SOLN
INTRAMUSCULAR | Status: AC
  Filled 2012-06-26: qty 4

## 2012-06-26 MED ORDER — IOHEXOL 300 MG/ML  SOLN
150.0000 mL | Freq: Once | INTRAMUSCULAR | Status: AC | PRN
  Administered 2012-06-26: 50 mL via INTRA_ARTERIAL

## 2012-06-26 MED ORDER — SODIUM CHLORIDE 0.9 % IV SOLN: INTRAVENOUS | Status: AC

## 2012-06-26 MED ORDER — MIDAZOLAM HCL 2 MG/2ML IJ SOLN
INTRAMUSCULAR | Status: DC | PRN
  Administered 2012-06-26: 1 mg via INTRAVENOUS

## 2012-06-26 MED ORDER — SODIUM CHLORIDE 0.9 % IV SOLN
Freq: Once | INTRAVENOUS | Status: AC
  Administered 2012-06-26: 10:00:00 via INTRAVENOUS

## 2012-06-26 MED ORDER — FENTANYL CITRATE 0.05 MG/ML IJ SOLN
INTRAMUSCULAR | Status: DC | PRN
  Administered 2012-06-26: 25 ug via INTRAVENOUS

## 2012-06-26 NOTE — Procedures (Signed)
S/P bilateral common carotid arteriogram . RT CFA approach. Findings . 1. App 3mm x 2.17mm stable neck remnant of previously colied Acom aneurysm.

## 2012-06-26 NOTE — H&P (Signed)
[name] is an 62 y.o. female.   Chief Complaint: SAH/ Anterior communicating artery aneurysm rupture Coiling 08/2011 Pt has done well; no complaints Scheduled for recheck cerebral arteriogram HPI: ACA aneurysm; HTN; CVA; Hx PE; Sz; DM  Past Medical History  Diagnosis Date  . Essential hypertension, benign   . Stroke   . PE (pulmonary embolism)     Status post IVC placement  . Seizure disorder   . SAH (subarachnoid hemorrhage) 08/16/11    Coiling of ACA aneurysm, VP shunt  . Depression   . Type 2 diabetes mellitus   . Esophageal reflux   . Attention deficit disorder of childhood with hyperactivity   . Recurrent UTI     Past Surgical History  Procedure Laterality Date  . Abdominal hysterectomy    . Ventriculoperitoneal shunt  09/02/2011    Procedure: SHUNT INSERTION VENTRICULAR-PERITONEAL;  Surgeon: Maeola Harman, MD;  Location: [facility];  Service: Neurosurgery;  Laterality: Right;  . Aneurysm coiling  08/16/11    Family History  Problem Relation Age of Onset  . Hypertension     Social History:  reports that she has never smoked. She does not have any smokeless tobacco history on file. She reports that she does not drink alcohol or use illicit drugs.  Allergies:  Allergies  Allergen Reactions  . Lactose Intolerance (Gi) Other (See Comments)    G.I. Upset, cereal ok   . Penicillins     Doesn't remember what happens     (Not in a hospital admission)  No results found for this or any previous visit (from the past 48 hour(s)). No results found.  Review of Systems  Constitutional: Negative for fever.  Eyes: Negative for blurred vision.  Respiratory: Negative for shortness of breath.   Cardiovascular: Negative for chest pain.  Gastrointestinal: Negative for nausea, vomiting and abdominal pain.  Neurological: Negative for dizziness, tingling, sensory change, speech change, weakness and headaches.    There were no vitals taken for this visit. Physical Exam   Constitutional: She is oriented to person, place, and time. She appears well-developed and well-nourished.  Flat affect  Cardiovascular: Normal rate, regular rhythm and normal heart sounds.   No murmur heard. Respiratory: Effort normal and breath sounds normal. She has no wheezes.  GI: Soft. Bowel sounds are normal. There is no tenderness.  Musculoskeletal: Normal range of motion.  Neurological: She is alert and oriented to person, place, and time. No cranial nerve deficit. Coordination normal.  Skin: Skin is warm.  Psychiatric: She has a normal mood and affect. Her behavior is normal. Thought content normal.     Assessment/Plan Previous ACA aneurysm coiling 08/2011 Pt doing well Scheduled for recheck arteriogram Pt and husband aware of procedure benefits and risks and agreeable to proceed. Consent signed and in chart   [name] A 06/26/2012, 10:03 AM

## 2012-11-16 ENCOUNTER — Encounter: Payer: Self-pay | Admitting: Physical Medicine & Rehabilitation

## 2012-11-16 ENCOUNTER — Encounter
Payer: BC Managed Care – PPO | Attending: Physical Medicine & Rehabilitation | Admitting: Physical Medicine & Rehabilitation

## 2012-11-16 VITALS — BP 97/63 | HR 66 | Resp 16 | Ht 65.0 in | Wt 226.0 lb

## 2012-11-16 DIAGNOSIS — R569 Unspecified convulsions: Secondary | ICD-10-CM | POA: Insufficient documentation

## 2012-11-16 DIAGNOSIS — M67919 Unspecified disorder of synovium and tendon, unspecified shoulder: Secondary | ICD-10-CM | POA: Insufficient documentation

## 2012-11-16 DIAGNOSIS — I609 Nontraumatic subarachnoid hemorrhage, unspecified: Secondary | ICD-10-CM | POA: Insufficient documentation

## 2012-11-16 DIAGNOSIS — M7582 Other shoulder lesions, left shoulder: Secondary | ICD-10-CM

## 2012-11-16 DIAGNOSIS — M719 Bursopathy, unspecified: Secondary | ICD-10-CM | POA: Insufficient documentation

## 2012-11-16 DIAGNOSIS — Z8673 Personal history of transient ischemic attack (TIA), and cerebral infarction without residual deficits: Secondary | ICD-10-CM | POA: Insufficient documentation

## 2012-11-16 DIAGNOSIS — F341 Dysthymic disorder: Secondary | ICD-10-CM | POA: Insufficient documentation

## 2012-11-16 NOTE — Patient Instructions (Signed)
CALL ME WITH ANY PROBLEMS OR QUESTIONS (#[phone]).  HAVE A GOOD DAY  

## 2012-11-16 NOTE — Progress Notes (Signed)
Subjective:    Patient ID: [name], female    DOB: [birth year], 62 y.o.   MRN: [id number]  HPI  [name] is back regarding her SAH and infarct. Things have been going well at home. She has been taking care of herself, making meals, etc. She is not using a device for balance. She hasn't had any falls.   Her blood pressure has been under control. Dr. Sherril Croon has been managing her medical issues    Pain Inventory Average Pain 0 Pain Right Now 0 My pain is n/a  In the last 24 hours, has pain interfered with the following? General activity 0 Relation with others 0 Enjoyment of life 0 What TIME of day is your pain at its worst? n/a Sleep (in general) Good  Pain is worse with: n/a Pain improves with: n/a Relief from Meds: 10  Mobility how many minutes can you walk? 30 do you drive?  no  Function disabled: date disabled 08/2011 Do you have any goals in this area?  no  Neuro/Psych No problems in this area  Prior Studies Any changes since last visit?  no  Physicians involved in your care Primary care Dr Joya Martyr   Family History  Problem Relation Age of Onset  . Hypertension     History   Social History  . Marital Status: Married    Spouse Name: N/A    Number of Children: N/A  . Years of Education: N/A   Occupational History  . Disability     Retired from [employer] and [employer]   Social History Main Topics  . Smoking status: Never Smoker   . Smokeless tobacco: None  . Alcohol Use: No  . Drug Use: No  . Sexual Activity:    Other Topics Concern  . None   Social History Narrative  . None   Past Surgical History  Procedure Laterality Date  . Abdominal hysterectomy    . Ventriculoperitoneal shunt  09/02/2011    Procedure: SHUNT INSERTION VENTRICULAR-PERITONEAL;  Surgeon: Maeola Harman, MD;  Location: [facility];  Service: Neurosurgery;  Laterality: Right;  . Aneurysm coiling  08/16/11   Past Medical History  Diagnosis Date  . Essential hypertension,  benign   . Stroke   . PE (pulmonary embolism)     Status post IVC placement  . Seizure disorder   . SAH (subarachnoid hemorrhage) 08/16/11    Coiling of ACA aneurysm, VP shunt  . Depression   . Type 2 diabetes mellitus   . Esophageal reflux   . Attention deficit disorder of childhood with hyperactivity   . Recurrent UTI    BP 97/63  Pulse 66  Resp 16  Ht 5\' 5"  (1.651 m)  Wt 226 lb (102.513 kg)  BMI 37.61 kg/m2  SpO2 97%     Review of Systems  All other systems reviewed and are negative.       Objective:   Physical Exam  General: Alert and oriented to name, date with extra time with cueing. Still flat.  HEENT: Head is normocephalic, atraumatic, PERRLA, EOMI, sclera anicteric, oral mucosa pink and moist, dentition intact, ext ear canals clear,  Neck: Supple without JVD or lymphadenopathy  Heart: Reg rate and rhythm. No murmurs rubs or gallops  Chest: CTA bilaterally without wheezes, rales, or rhonchi; no distress  Abdomen: Soft, non-tender, non-distended, bowel sounds positive.  Extremities: No clubbing, cyanosis, or edema. Pulses are 2+  Skin: Clean and intact without signs of breakdown  Neuro:affect more dynamic. Reasonable  insight and awareness.  good balance--doesn't drift any more. Could even walk heel to toe for a few steps. Cranial nerves 2-12 are grossly intact. Sensory exam is normal. Reflexes are 2+ in all 4's. Fine motor coordination is fair to good. No tremors. Motor function is grossly 5/5 in all 4's.  usculoskeletal: left shoulder with mild tenderness in ER and IR today.  Psych: appropriate.    Assessment & Plan:   1. subarachnoid hemorrhage with small infarct left internal capsule: Status post IVC placement 08/18/2011 with coiling of aneurysm 08/19/2011 and VP shunt placement 09/02/2011  2. Depression- reactive- improved  3. Seizure disorder related to the above.  4. Left rotator cuff syndrome-improved  Plan:  1. Encouraged pt to expand activity at  home.  2. BP mgt per PCP.  3. Follow up with me prn. She has dome EXTREMELY well!!!

## 2013-03-28 ENCOUNTER — Other Ambulatory Visit (HOSPITAL_COMMUNITY): Payer: Self-pay | Admitting: Interventional Radiology

## 2013-03-28 ENCOUNTER — Telehealth (HOSPITAL_COMMUNITY): Payer: Self-pay | Admitting: Interventional Radiology

## 2013-03-28 DIAGNOSIS — I729 Aneurysm of unspecified site: Secondary | ICD-10-CM

## 2013-03-28 NOTE — Telephone Encounter (Signed)
Called pt, left VM for her to call to schedule angio. JM 

## 2013-03-29 ENCOUNTER — Telehealth (HOSPITAL_COMMUNITY): Payer: Self-pay | Admitting: Interventional Radiology

## 2013-03-29 ENCOUNTER — Encounter (HOSPITAL_COMMUNITY): Payer: Self-pay | Admitting: Pharmacy Technician

## 2013-03-29 NOTE — Telephone Encounter (Signed)
Called pt, left VM to call and schedule angio JM

## 2013-04-03 ENCOUNTER — Other Ambulatory Visit: Payer: Self-pay | Admitting: Radiology

## 2013-04-05 ENCOUNTER — Encounter (HOSPITAL_COMMUNITY): Payer: Self-pay

## 2013-04-05 ENCOUNTER — Ambulatory Visit (HOSPITAL_COMMUNITY)
Admission: RE | Admit: 2013-04-05 | Discharge: 2013-04-05 | Disposition: A | Discharge status: Home / Self Care | Payer: BC Managed Care – PPO | Source: Ambulatory Visit | Attending: Interventional Radiology | Admitting: Interventional Radiology

## 2013-04-05 DIAGNOSIS — K219 Gastro-esophageal reflux disease without esophagitis: Secondary | ICD-10-CM | POA: Insufficient documentation

## 2013-04-05 DIAGNOSIS — F329 Major depressive disorder, single episode, unspecified: Secondary | ICD-10-CM | POA: Insufficient documentation

## 2013-04-05 DIAGNOSIS — I10 Essential (primary) hypertension: Secondary | ICD-10-CM | POA: Insufficient documentation

## 2013-04-05 DIAGNOSIS — Z9889 Other specified postprocedural states: Secondary | ICD-10-CM | POA: Insufficient documentation

## 2013-04-05 DIAGNOSIS — F3289 Other specified depressive episodes: Secondary | ICD-10-CM | POA: Insufficient documentation

## 2013-04-05 DIAGNOSIS — I671 Cerebral aneurysm, nonruptured: Secondary | ICD-10-CM | POA: Insufficient documentation

## 2013-04-05 DIAGNOSIS — Z8673 Personal history of transient ischemic attack (TIA), and cerebral infarction without residual deficits: Secondary | ICD-10-CM | POA: Insufficient documentation

## 2013-04-05 DIAGNOSIS — E119 Type 2 diabetes mellitus without complications: Secondary | ICD-10-CM | POA: Insufficient documentation

## 2013-04-05 DIAGNOSIS — F988 Other specified behavioral and emotional disorders with onset usually occurring in childhood and adolescence: Secondary | ICD-10-CM | POA: Insufficient documentation

## 2013-04-05 DIAGNOSIS — Z86711 Personal history of pulmonary embolism: Secondary | ICD-10-CM | POA: Insufficient documentation

## 2013-04-05 DIAGNOSIS — I729 Aneurysm of unspecified site: Secondary | ICD-10-CM

## 2013-04-05 DIAGNOSIS — G40909 Epilepsy, unspecified, not intractable, without status epilepticus: Secondary | ICD-10-CM | POA: Insufficient documentation

## 2013-04-05 LAB — APTT: aPTT: 37 seconds (ref 24–37)

## 2013-04-05 LAB — BASIC METABOLIC PANEL
BUN: 17 mg/dL (ref 6–23)
CHLORIDE: 103 meq/L (ref 96–112)
CO2: 23 mEq/L (ref 19–32)
Calcium: 9.6 mg/dL (ref 8.4–10.5)
Creatinine, Ser: 1.21 mg/dL — ABNORMAL HIGH (ref 0.50–1.10)
GFR, EST AFRICAN AMERICAN: 54 mL/min — AB (ref >90)
GFR, EST NON AFRICAN AMERICAN: 46 mL/min — AB (ref >90)
Glucose, Bld: 105 mg/dL — ABNORMAL HIGH (ref 70–99)
POTASSIUM: 4.4 meq/L (ref 3.7–5.3)
SODIUM: 141 meq/L (ref 137–147)

## 2013-04-05 LAB — CBC
HEMATOCRIT: 34.7 % — AB (ref 36.0–46.0)
HEMOGLOBIN: 11.1 g/dL — AB (ref 12.0–15.0)
MCH: 26.9 pg (ref 26.0–34.0)
MCHC: 32 g/dL (ref 30.0–36.0)
MCV: 84.2 fL (ref 78.0–100.0)
Platelets: 226 10*3/uL (ref 150–400)
RBC: 4.12 MIL/uL (ref 3.87–5.11)
RDW: 15.3 % (ref 11.5–15.5)
WBC: 4.6 10*3/uL (ref 4.0–10.5)

## 2013-04-05 LAB — GLUCOSE, CAPILLARY
GLUCOSE-CAPILLARY: 103 mg/dL — AB (ref 70–99)
GLUCOSE-CAPILLARY: 102 mg/dL — AB (ref 70–99)

## 2013-04-05 LAB — PROTIME-INR
INR: 0.99 (ref 0.00–1.49)
PROTHROMBIN TIME: 12.9 s (ref 11.6–15.2)

## 2013-04-05 MED ORDER — SODIUM CHLORIDE 0.9 % IV SOLN: INTRAVENOUS | Status: DC

## 2013-04-05 MED ORDER — IOHEXOL 300 MG/ML  SOLN
150.0000 mL | Freq: Once | INTRAMUSCULAR | Status: AC | PRN
  Administered 2013-04-05: 70 mL via INTRA_ARTERIAL

## 2013-04-05 MED ORDER — FENTANYL CITRATE 0.05 MG/ML IJ SOLN
INTRAMUSCULAR | Status: AC
  Filled 2013-04-05: qty 2

## 2013-04-05 MED ORDER — SODIUM CHLORIDE 0.9 % IV SOLN: INTRAVENOUS | Status: AC

## 2013-04-05 MED ORDER — MIDAZOLAM HCL 2 MG/2ML IJ SOLN
INTRAMUSCULAR | Status: AC
  Filled 2013-04-05: qty 2

## 2013-04-05 MED ORDER — MIDAZOLAM HCL 2 MG/2ML IJ SOLN
INTRAMUSCULAR | Status: AC | PRN
Start: 2013-04-05 — End: 2013-04-05
  Administered 2013-04-05: 1 mg via INTRAVENOUS

## 2013-04-05 MED ORDER — SODIUM CHLORIDE 0.9 % IV SOLN
INTRAVENOUS | Status: AC | PRN
  Administered 2013-04-05: 75 mL/h via INTRAVENOUS

## 2013-04-05 MED ORDER — FENTANYL CITRATE 0.05 MG/ML IJ SOLN
INTRAMUSCULAR | Status: AC | PRN
  Administered 2013-04-05: 25 ug via INTRAVENOUS

## 2013-04-05 MED ORDER — HEPARIN SOD (PORK) LOCK FLUSH 100 UNIT/ML IV SOLN
INTRAVENOUS | Status: AC | PRN
  Administered 2013-04-05: 1000 [IU] via INTRAVENOUS

## 2013-04-05 NOTE — Procedures (Signed)
S/P bilateral common carotid arteriograms. RT CFA approach. Findings. 1.Stable 3.88mmx 2.31mmneck remnant of previously coiled Acom aneurysm

## 2013-04-05 NOTE — Sedation Documentation (Signed)
O2 d/c'd 

## 2013-04-05 NOTE — Discharge Instructions (Signed)

## 2013-04-05 NOTE — Sedation Documentation (Signed)
MD at bedside.  Explaining findings to pt and sister.

## 2013-04-05 NOTE — Sedation Documentation (Signed)
Exoseal closure by Tillman Sers RT

## 2013-04-05 NOTE — Sedation Documentation (Signed)
O2 2l/Sherrill started 

## 2013-04-05 NOTE — Sedation Documentation (Signed)
MD at bedside. 

## 2013-04-05 NOTE — H&P (Signed)
[name] is an 63 y.o. female.   Chief Complaint: Known to [facility] service Previous coiling Anterior Communicating Artery Aneurysm 08/2011 Has done well. Denies headache; numbness; tingling or neuro difficulties Arteriogram 06/2012 revealed stable aneurysm coils; with very small neck remnant Pt now for cerebral arteriogram in follow up  HPI: HTN; CVA; Hx PE; Sz; SAH; DM; ADD  Past Medical History  Diagnosis Date  . Essential hypertension, benign   . Stroke   . PE (pulmonary embolism)     Status post IVC placement  . Seizure disorder   . SAH (subarachnoid hemorrhage) 08/16/11    Coiling of ACA aneurysm, VP shunt  . Depression   . Type 2 diabetes mellitus   . Esophageal reflux   . Attention deficit disorder of childhood with hyperactivity   . Recurrent UTI     Past Surgical History  Procedure Laterality Date  . Abdominal hysterectomy    . Ventriculoperitoneal shunt  09/02/2011    Procedure: SHUNT INSERTION VENTRICULAR-PERITONEAL;  Surgeon: Erline Levine, MD;  Location: [place] [facility];  Service: Neurosurgery;  Laterality: Right;  . Aneurysm coiling  08/16/11    Family History  Problem Relation Age of Onset  . Hypertension     Social History:  reports that she has never smoked. She does not have any smokeless tobacco history on file. She reports that she does not drink alcohol or use illicit drugs.  Allergies:  Allergies  Allergen Reactions  . Penicillins     Doesn't remember what happens     (Not in a hospital admission)  Results for orders placed during the hospital encounter of 04/05/13 (from the past 48 hour(s))  GLUCOSE, CAPILLARY     Status: Abnormal   Collection Time    04/05/13  6:35 AM      Result Value Ref Range   Glucose-Capillary 103 (*) 70 - 99 mg/dL  APTT     Status: None   Collection Time    04/05/13  6:52 AM      Result Value Ref Range   aPTT 37  24 - 37 seconds   Comment:            IF BASELINE aPTT IS ELEVATED,     SUGGEST PATIENT RISK  ASSESSMENT     BE USED TO DETERMINE APPROPRIATE     ANTICOAGULANT THERAPY.  BASIC METABOLIC PANEL     Status: Abnormal   Collection Time    04/05/13  6:52 AM      Result Value Ref Range   Sodium 141  137 - 147 mEq/L   Potassium 4.4  3.7 - 5.3 mEq/L   Chloride 103  96 - 112 mEq/L   CO2 23  19 - 32 mEq/L   Glucose, Bld 105 (*) 70 - 99 mg/dL   BUN 17  6 - 23 mg/dL   Creatinine, Ser 1.21 (*) 0.50 - 1.10 mg/dL   Calcium 9.6  8.4 - 10.5 mg/dL   GFR calc non Af Amer 46 (*) >90 mL/min   GFR calc Af Amer 54 (*) >90 mL/min   Comment: (NOTE)     The eGFR has been calculated using the CKD EPI equation.     This calculation has not been validated in all clinical situations.     eGFR's persistently <90 mL/min signify possible Chronic Kidney     Disease.  CBC     Status: Abnormal   Collection Time    04/05/13  6:52 AM  Result Value Ref Range   WBC 4.6  4.0 - 10.5 K/uL   RBC 4.12  3.87 - 5.11 MIL/uL   Hemoglobin 11.1 (*) 12.0 - 15.0 g/dL   HCT 34.7 (*) 36.0 - 46.0 %   MCV 84.2  78.0 - 100.0 fL   MCH 26.9  26.0 - 34.0 pg   MCHC 32.0  30.0 - 36.0 g/dL   RDW 15.3  11.5 - 15.5 %   Platelets 226  150 - 400 K/uL  PROTIME-INR     Status: None   Collection Time    04/05/13  6:52 AM      Result Value Ref Range   Prothrombin Time 12.9  11.6 - 15.2 seconds   INR 0.99  0.00 - 1.49   No results found.  Review of Systems  Constitutional: Negative for weight loss.  Eyes: Negative for blurred vision and double vision.  Respiratory: Negative for shortness of breath.   Cardiovascular: Negative for chest pain.  Gastrointestinal: Negative for nausea, vomiting and abdominal pain.  Neurological: Negative for dizziness, tingling, focal weakness, weakness and headaches.  Psychiatric/Behavioral: Negative for substance abuse.    Blood pressure 138/70, pulse 58, temperature 97.7 F (36.5 C), temperature source Oral, resp. rate 18, height _0  (1.651 m), weight 107.502 kg (237 lb), SpO2  100.00%. Physical Exam  Constitutional: She is oriented to person, place, and time. She appears well-developed.  Cardiovascular: Normal rate and regular rhythm.   Murmur heard. Very faint murmur  Respiratory: Effort normal and breath sounds normal. No respiratory distress. She has no wheezes.  GI: Soft. Bowel sounds are normal. There is no tenderness.  Musculoskeletal: Normal range of motion.  Neurological: She is alert and oriented to person, place, and time.  Skin: Skin is warm and dry.  Psychiatric: She has a normal mood and affect. Her behavior is normal. Judgment and thought content normal.     Assessment/Plan Hx ACOM aneurysm coiling 2013 Cer Art 06/2012 reveals stable coiling and stable small neck remnant Scheduled now for follow up arteriogram Pt and family aware of procedure benefits and risks and agreeable to proceed Consent signed and in chart  [name] A 04/05/2013, 7:44 AM

## 2013-04-18 ENCOUNTER — Other Ambulatory Visit: Payer: Self-pay | Admitting: Cardiology

## 2013-05-20 ENCOUNTER — Other Ambulatory Visit: Payer: Self-pay | Admitting: Cardiology

## 2013-06-18 ENCOUNTER — Other Ambulatory Visit: Payer: Self-pay | Admitting: Cardiology

## 2013-07-15 ENCOUNTER — Other Ambulatory Visit: Payer: Self-pay | Admitting: Cardiology

## 2013-11-01 ENCOUNTER — Telehealth (HOSPITAL_COMMUNITY): Payer: Self-pay | Admitting: Interventional Radiology

## 2013-11-01 NOTE — Telephone Encounter (Signed)
Spoke to pt's sister about her upcoming MRI/MRA that is due for f/u. She is going to check w/ her sister and verify her insurance info and call me back JM

## 2013-11-21 ENCOUNTER — Other Ambulatory Visit (HOSPITAL_COMMUNITY): Payer: Self-pay | Admitting: Interventional Radiology

## 2013-11-21 DIAGNOSIS — I609 Nontraumatic subarachnoid hemorrhage, unspecified: Secondary | ICD-10-CM

## 2013-11-25 ENCOUNTER — Ambulatory Visit (HOSPITAL_COMMUNITY)
Admission: RE | Admit: 2013-11-25 | Discharge: 2013-11-25 | Disposition: A | Discharge status: Home / Self Care | Payer: Medicare Other | Source: Ambulatory Visit | Attending: Interventional Radiology | Admitting: Interventional Radiology

## 2013-11-25 ENCOUNTER — Ambulatory Visit (HOSPITAL_COMMUNITY): Payer: Medicare Other

## 2013-11-25 DIAGNOSIS — Z9689 Presence of other specified functional implants: Secondary | ICD-10-CM | POA: Insufficient documentation

## 2013-11-25 DIAGNOSIS — I668 Occlusion and stenosis of other cerebral arteries: Secondary | ICD-10-CM | POA: Insufficient documentation

## 2013-11-25 DIAGNOSIS — I69398 Other sequelae of cerebral infarction: Secondary | ICD-10-CM | POA: Insufficient documentation

## 2013-11-25 DIAGNOSIS — G9389 Other specified disorders of brain: Secondary | ICD-10-CM | POA: Insufficient documentation

## 2013-11-25 DIAGNOSIS — I771 Stricture of artery: Secondary | ICD-10-CM | POA: Diagnosis not present

## 2013-11-25 DIAGNOSIS — I609 Nontraumatic subarachnoid hemorrhage, unspecified: Secondary | ICD-10-CM | POA: Insufficient documentation

## 2013-11-25 DIAGNOSIS — Z8679 Personal history of other diseases of the circulatory system: Secondary | ICD-10-CM | POA: Diagnosis not present

## 2013-11-25 LAB — POCT I-STAT CREATININE: CREATININE: 1.3 mg/dL — AB (ref 0.50–1.10)

## 2013-12-03 ENCOUNTER — Other Ambulatory Visit (HOSPITAL_COMMUNITY): Payer: Self-pay | Admitting: Interventional Radiology

## 2013-12-03 DIAGNOSIS — I729 Aneurysm of unspecified site: Secondary | ICD-10-CM

## 2013-12-06 ENCOUNTER — Ambulatory Visit (HOSPITAL_COMMUNITY)
Admission: RE | Admit: 2013-12-06 | Discharge: 2013-12-06 | Disposition: A | Discharge status: Home / Self Care | Payer: Medicare Other | Source: Ambulatory Visit | Attending: Interventional Radiology | Admitting: Interventional Radiology

## 2013-12-06 ENCOUNTER — Other Ambulatory Visit (HOSPITAL_COMMUNITY): Payer: Self-pay | Admitting: Interventional Radiology

## 2013-12-06 DIAGNOSIS — I729 Aneurysm of unspecified site: Secondary | ICD-10-CM

## 2013-12-06 DIAGNOSIS — I720 Aneurysm of carotid artery: Secondary | ICD-10-CM | POA: Diagnosis not present

## 2013-12-06 DIAGNOSIS — I609 Nontraumatic subarachnoid hemorrhage, unspecified: Secondary | ICD-10-CM | POA: Diagnosis present

## 2013-12-06 MED ORDER — IOHEXOL 350 MG/ML SOLN
50.0000 mL | Freq: Once | INTRAVENOUS | Status: AC | PRN
  Administered 2013-12-06: 50 mL via INTRAVENOUS

## 2013-12-12 ENCOUNTER — Telehealth (HOSPITAL_COMMUNITY): Payer: Self-pay | Admitting: Interventional Radiology

## 2013-12-12 NOTE — Telephone Encounter (Signed)
Called pt, told her that per Deveshwar she would need a CTA brain/neck in 6 month's time. She states understanding and is in agreement with this plan of care. JM

## 2014-06-17 IMAGING — CR DG CHEST 1V PORT
1 series · 1 of 1 positions shown · non-contrast
Comparison: 08/29/2011

CLINICAL DATA: Atelectasis, infiltrates

PORTABLE CHEST - 1 VIEW

[AP]
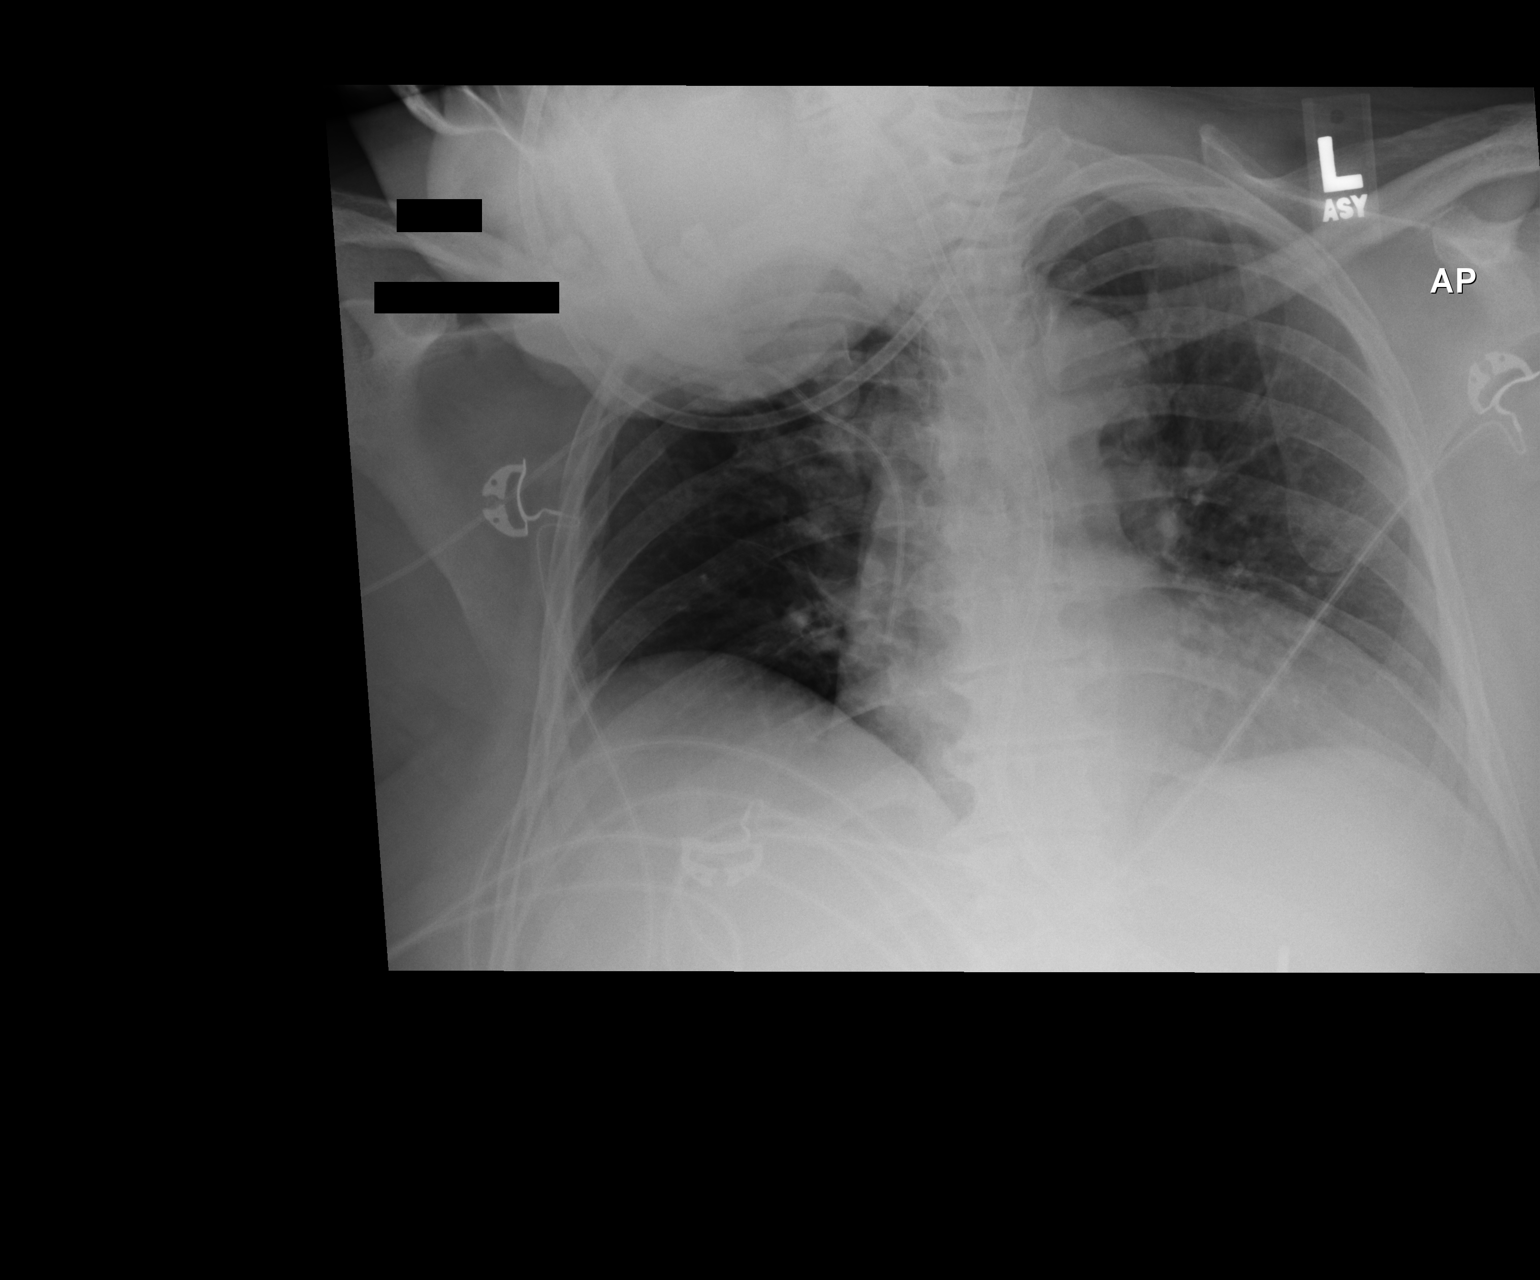

[1 of 1 positions shown; findings below may reference images not displayed]

FINDINGS: Low lung volumes.  Mild right perihilar/upper lobe
opacity is unchanged, possibly atelectasis.  No pleural effusion or
pneumothorax.

The heart is normal in size.

Stable right arm PICC and enteric tube.
IMPRESSION: Low lung volumes with stable mild right perihilar/upper lobe
opacity, possibly atelectasis.

## 2014-06-17 IMAGING — CT CT HEAD W/O CM
1 of 2 series · 13 of 30 positions shown, 17 images · non-contrast
Comparison: 08/30/2011

CLINICAL DATA: Post aneurysm coiling and subarachnoid hemorrhage.
Evaluate ventricles.

CT HEAD WITHOUT CONTRAST
TECHNIQUE: Contiguous axial images were obtained from the base of
the skull through the vertex without contrast.

[Series 2: brain · axial · 0.49mm/px · z∈[+121,+257]mm · 13 of 32 slices shown, 17 images]
[im 3/32  brain]
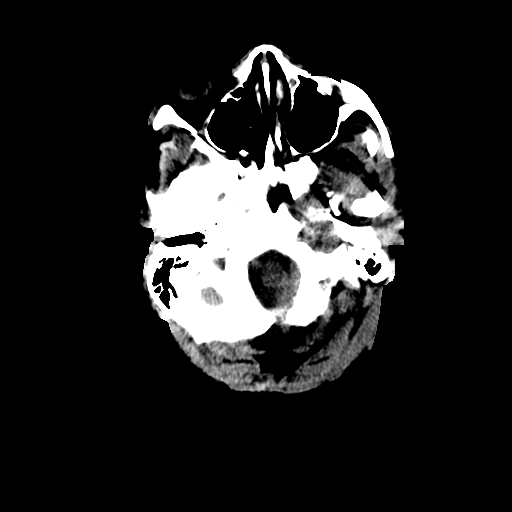
[im 3/32  bone]
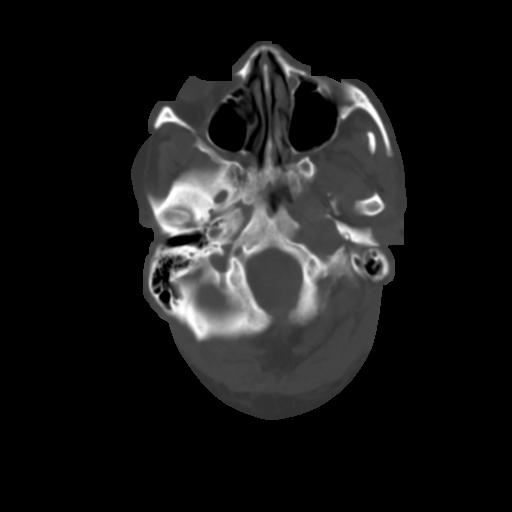
[im 5/32  brain]
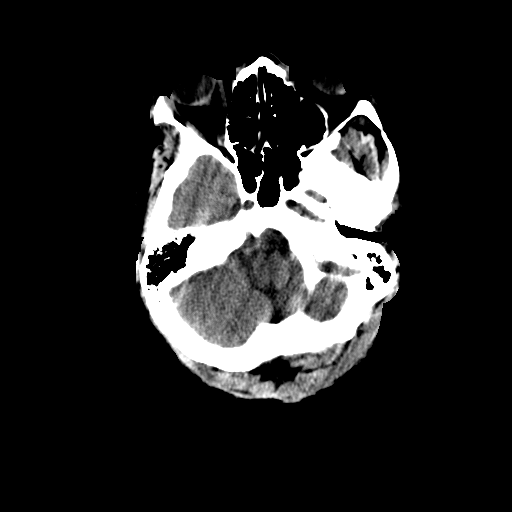
[im 7/32  brain]
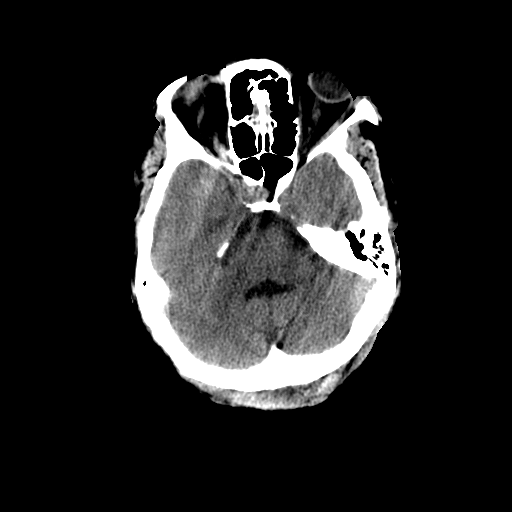
[im 9/32  brain]
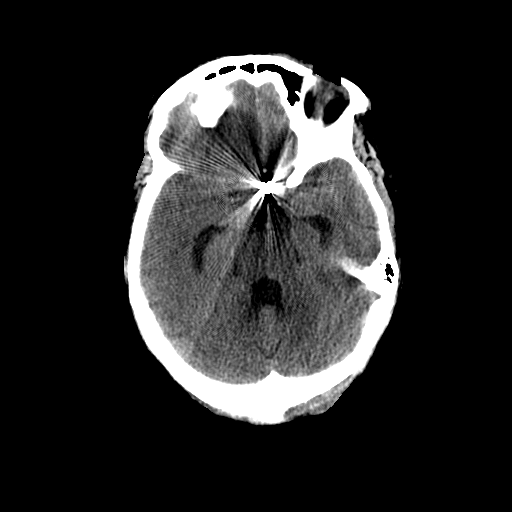
[im 12/32  brain]
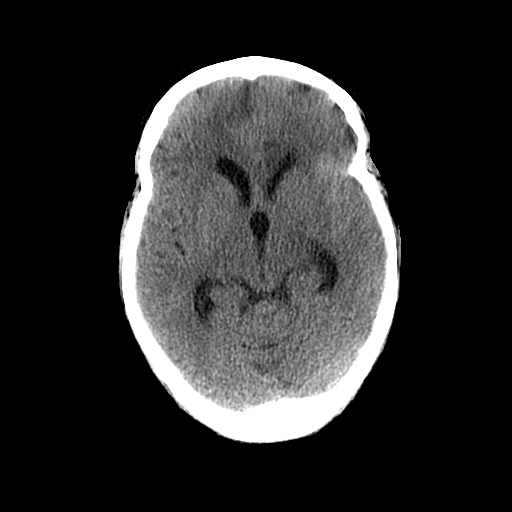
[im 12/32  bone]
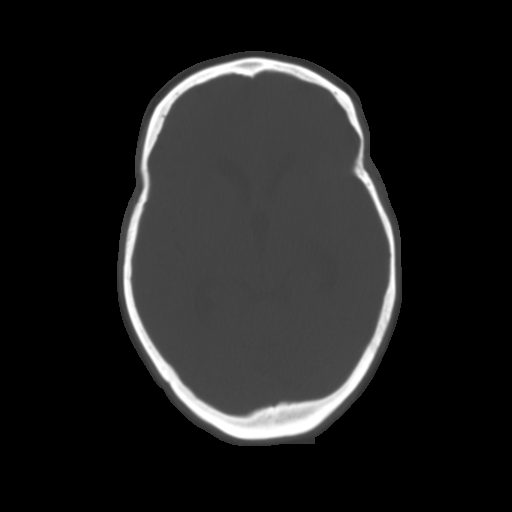
[im 14/32  brain]
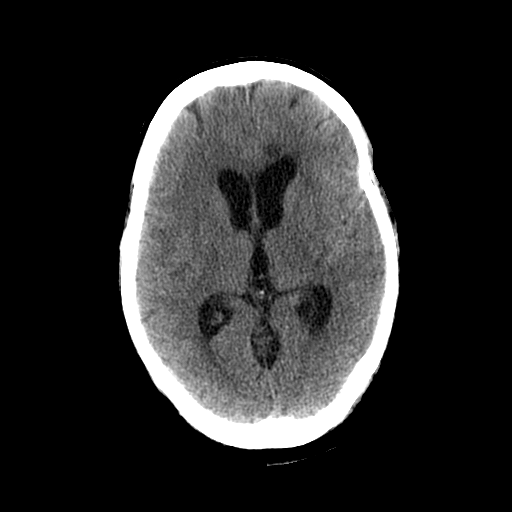
[im 16/32  brain]
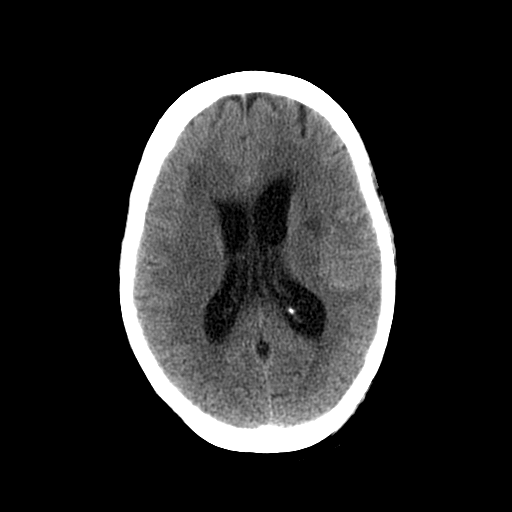
[im 18/32  brain]
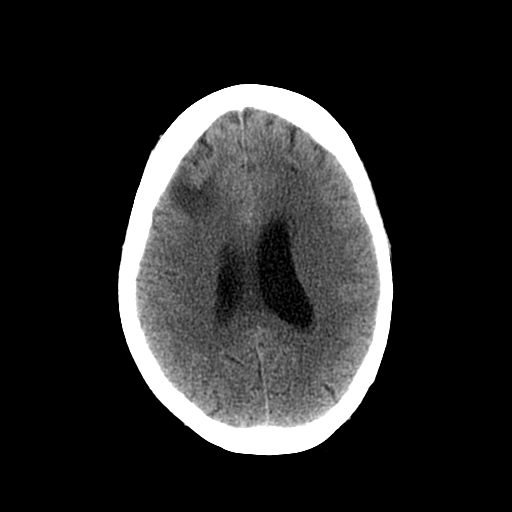
[im 20/32  brain]
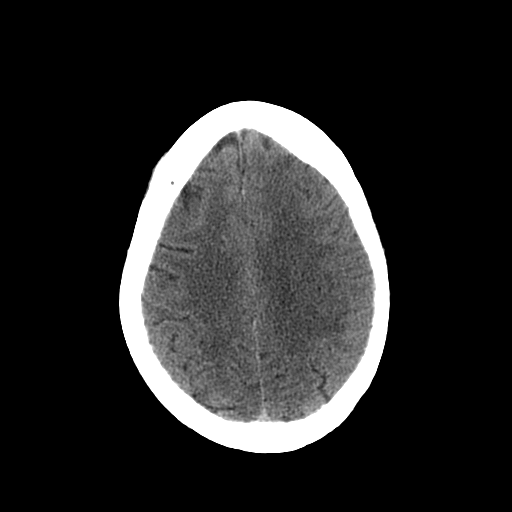
[im 20/32  bone]
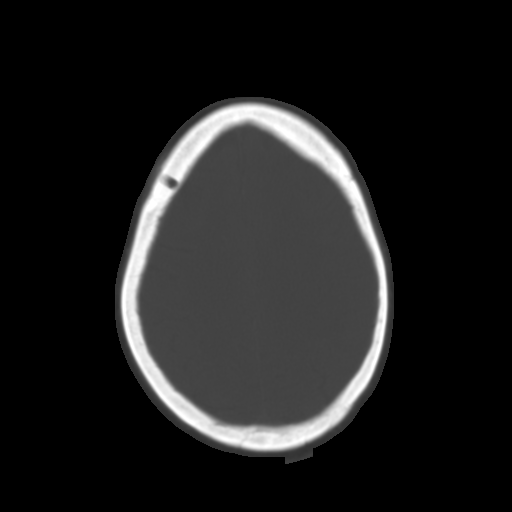
[im 23/32  brain]
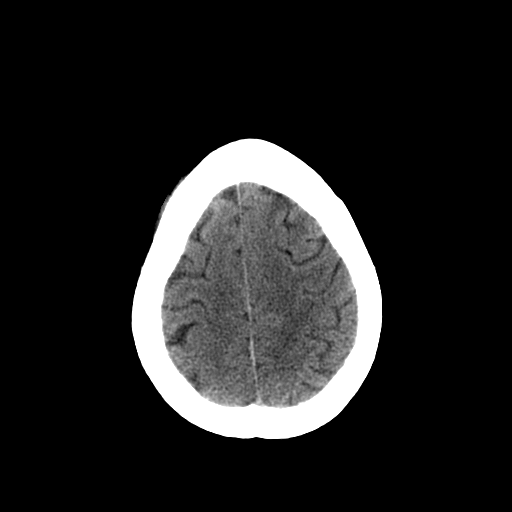
[im 25/32  brain]
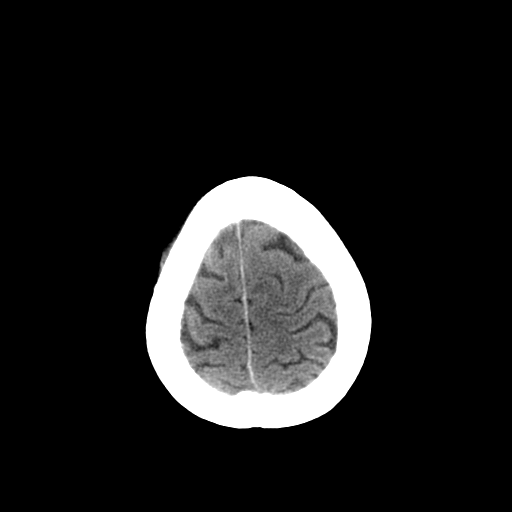
[im 27/32  brain]
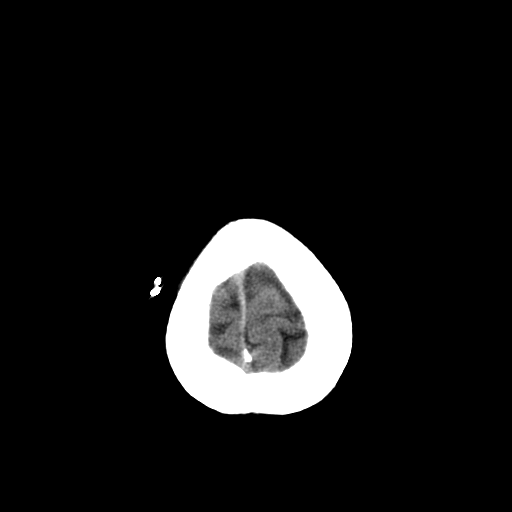
[im 29/32  brain]
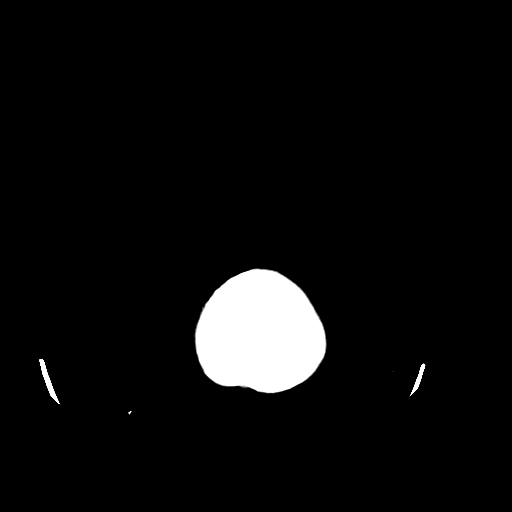
[im 29/32  bone]
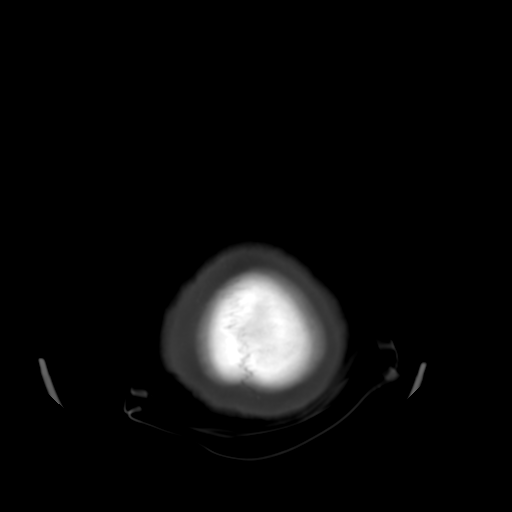

[13 of 30 positions shown; findings below may reference images not displayed]

FINDINGS: Ventricles are slightly larger on today's study.

Aneurysm coils in the anterior communicating artery region.  No
acute or recurrent hemorrhage.

Hypodensity right  inferior frontal lobe is unchanged and may
represent infarction.  Hypodensity in the mid right frontal lobe is
unchanged and  may be infarct or injury related to ventricular
catheter placement which has been removed.

Small hypodensity left anterior corpus callosum appears more
prominent and may represent developing acute infarct.  Hypodensity
left putamen may also represent subacute infarction.
IMPRESSION: Ventricle size is slightly larger.  No acute hemorrhage.

Low density areas of infarct in the right frontal lobe are stable.

Hypodensities in the left anterior corpus callosum and left putamen
compatible with evolving subacute infarct.

## 2014-06-18 IMAGING — CR DG CHEST 1V PORT
1 series · 1 of 1 positions shown · non-contrast
Comparison: 09/01/2011

CLINICAL DATA: Status post intubation.

PORTABLE CHEST - 1 VIEW

[AP]
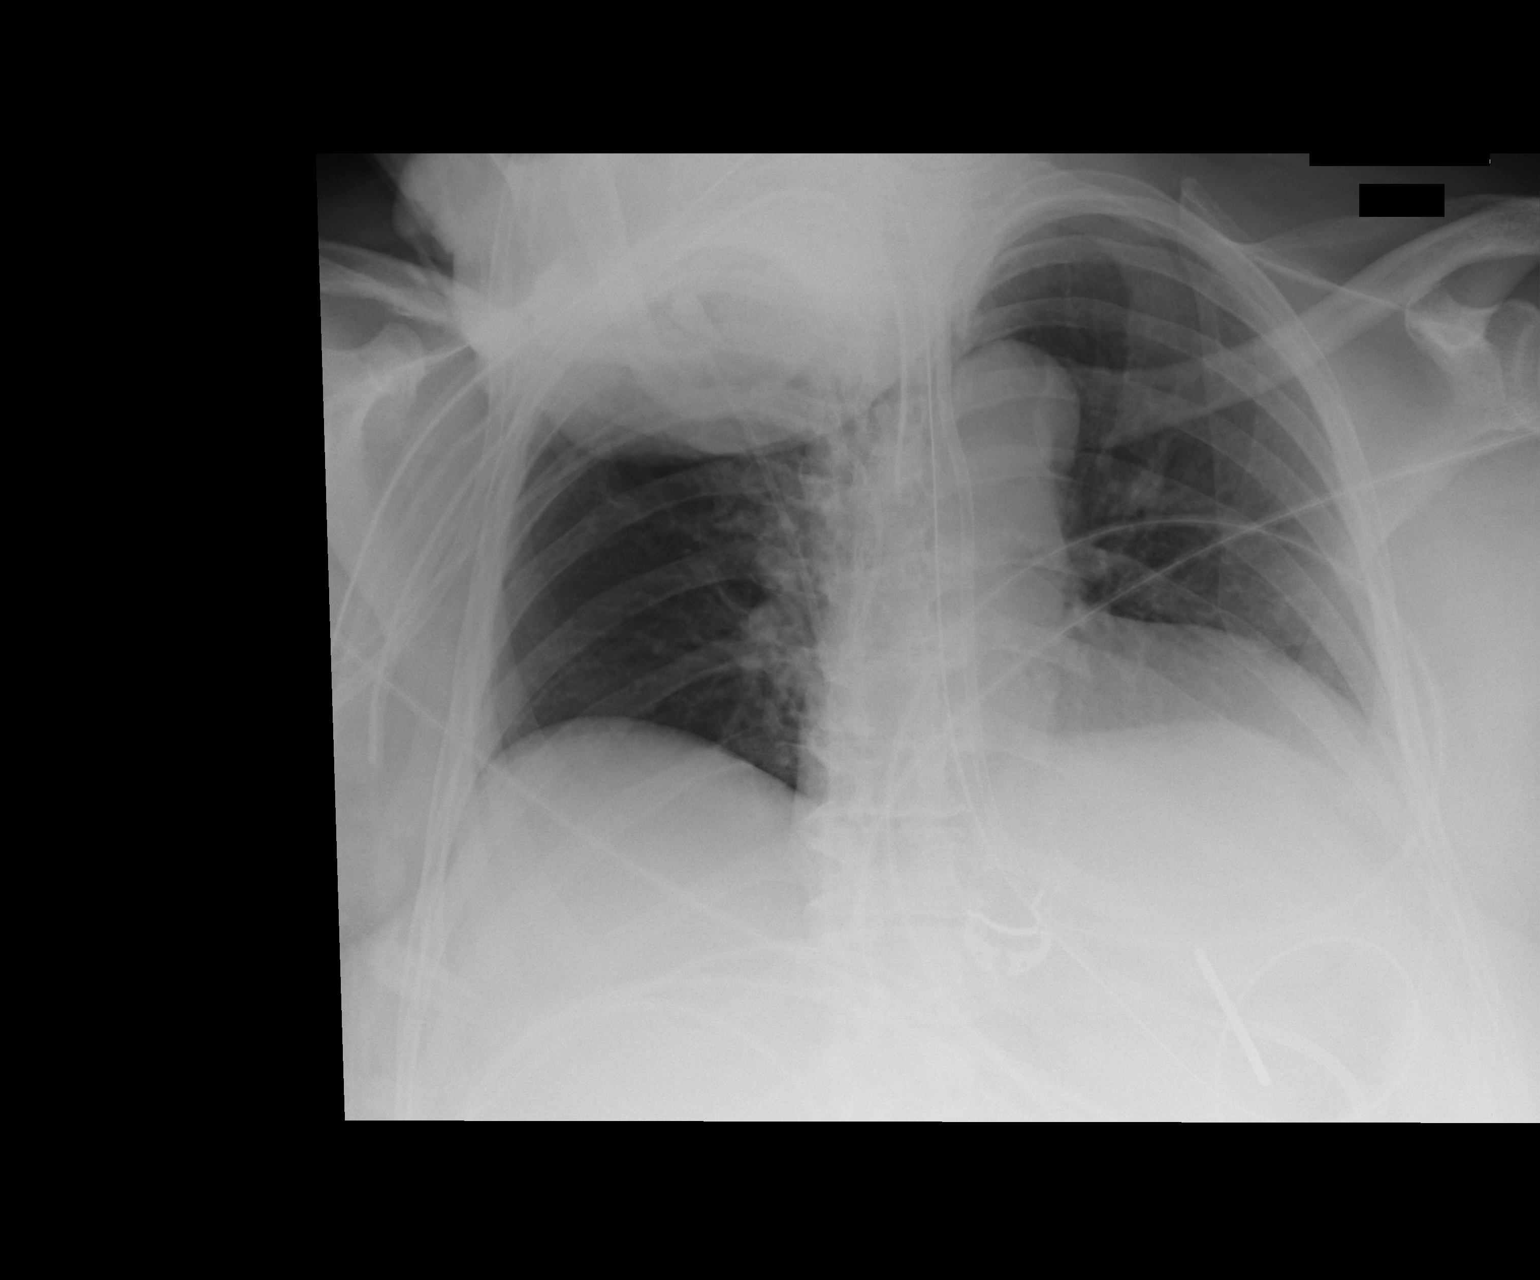

[1 of 1 positions shown; findings below may reference images not displayed]

FINDINGS: An endotracheal tube is in place with the tip
approximately 2 cm above the carina.  Nasogastric and feeding tubes
extend into the stomach.  Central line positioning stable.  No
pneumothorax.  No overt edema or pulmonary consolidation.  Heart
size is stable.
IMPRESSION: Endotracheal tube tip approximately 2 cm above the carina.

## 2014-06-19 IMAGING — CR DG CHEST 1V PORT
1 series · 1 of 1 positions shown · non-contrast
Comparison: the previous day's study

CLINICAL DATA: Hypoxemia

PORTABLE CHEST - 1 VIEW

[AP]
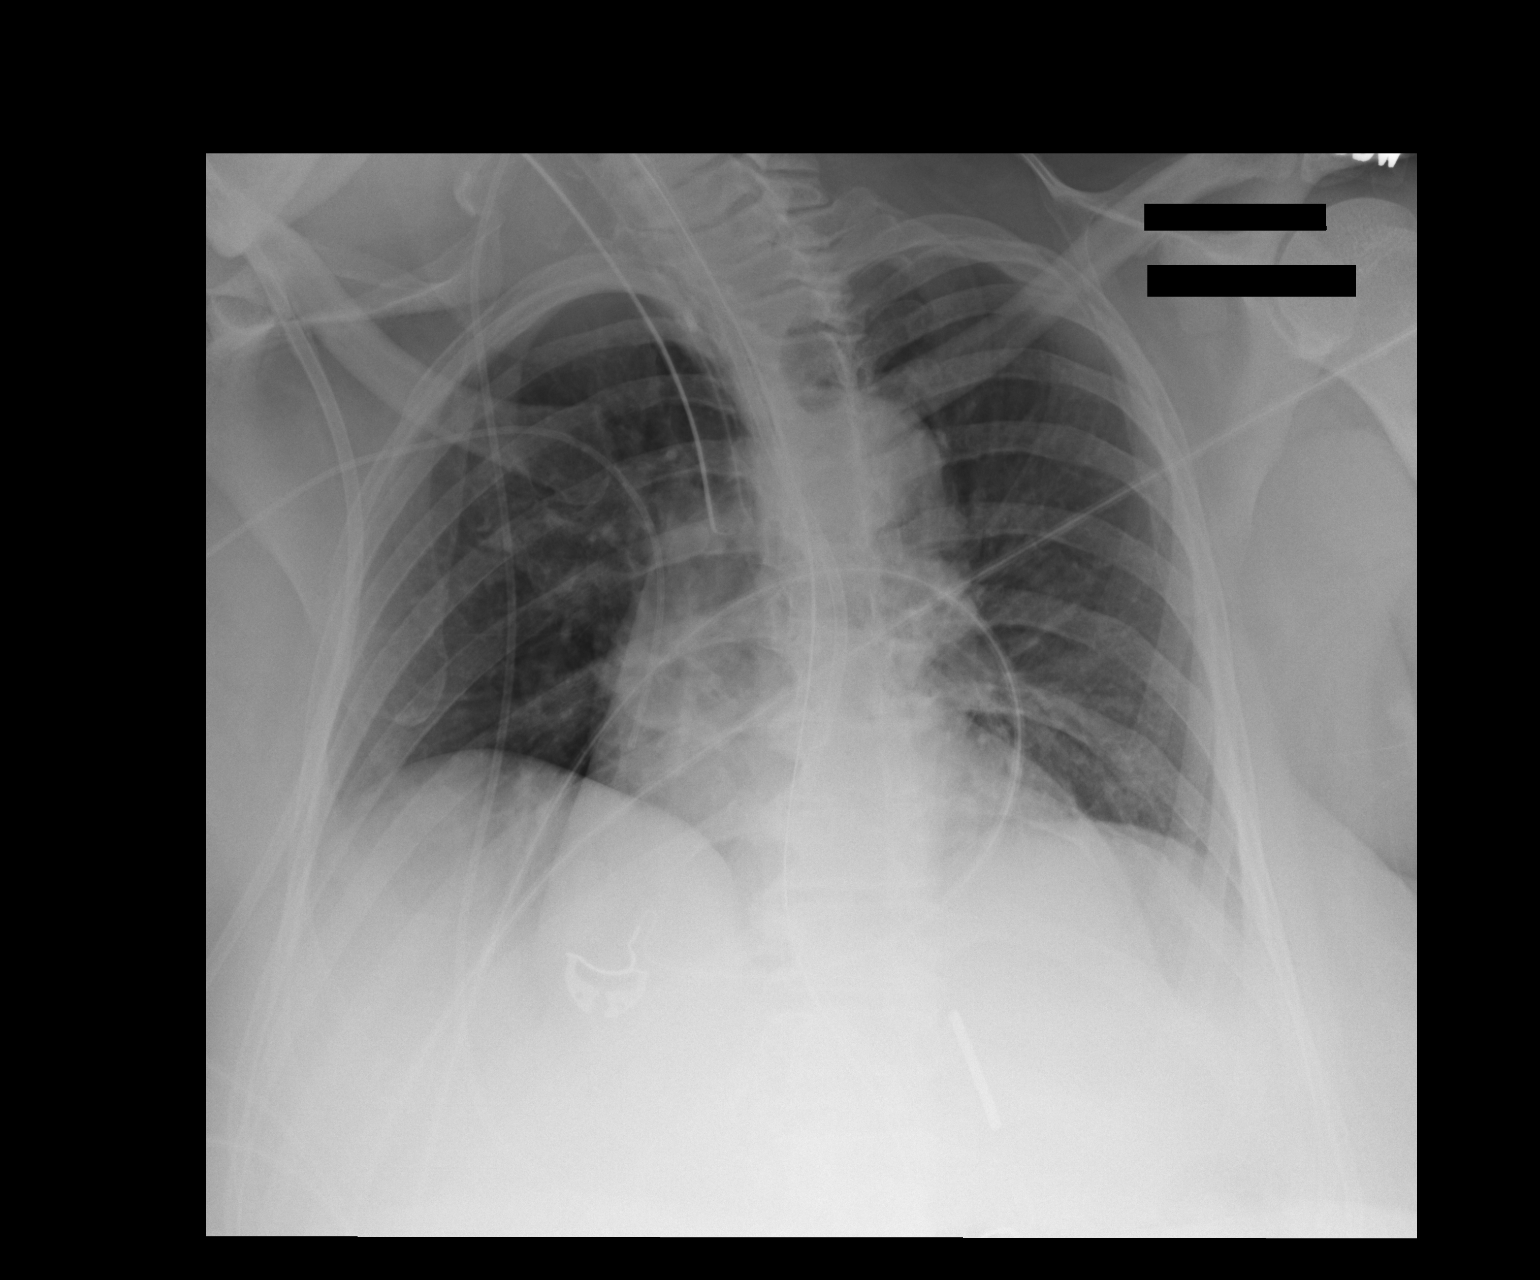

[1 of 1 positions shown; findings below may reference images not displayed]

FINDINGS: Endotracheal tube, nasogastric tube, right arm PICC, and
VP shunt catheter are again noted.  Low lung volumes with some
patchy bibasilar subsegmental atelectasis.  Heart size upper limits
normal.  No effusion.
IMPRESSION: 1.  Stable appearance since previous day's exam

## 2014-06-24 ENCOUNTER — Telehealth (HOSPITAL_COMMUNITY): Payer: Self-pay | Admitting: Interventional Radiology

## 2014-06-24 ENCOUNTER — Other Ambulatory Visit (HOSPITAL_COMMUNITY): Payer: Self-pay | Admitting: Interventional Radiology

## 2014-06-24 DIAGNOSIS — I729 Aneurysm of unspecified site: Secondary | ICD-10-CM

## 2014-06-24 NOTE — Telephone Encounter (Signed)
Called pt's home phone and cell phone to schedule her f/u CT angio head/neck. No answer and no VM. JM

## 2014-07-10 ENCOUNTER — Encounter (HOSPITAL_COMMUNITY): Payer: Self-pay

## 2014-07-10 ENCOUNTER — Ambulatory Visit (HOSPITAL_COMMUNITY)
Admission: RE | Admit: 2014-07-10 | Discharge: 2014-07-10 | Disposition: A | Discharge status: Home / Self Care | Payer: Medicare Other | Source: Ambulatory Visit | Attending: Interventional Radiology | Admitting: Interventional Radiology

## 2014-07-10 ENCOUNTER — Ambulatory Visit (HOSPITAL_COMMUNITY): Payer: Medicare Other

## 2014-07-10 DIAGNOSIS — I771 Stricture of artery: Secondary | ICD-10-CM | POA: Diagnosis not present

## 2014-07-10 DIAGNOSIS — I6523 Occlusion and stenosis of bilateral carotid arteries: Secondary | ICD-10-CM | POA: Insufficient documentation

## 2014-07-10 DIAGNOSIS — I671 Cerebral aneurysm, nonruptured: Secondary | ICD-10-CM | POA: Insufficient documentation

## 2014-07-10 DIAGNOSIS — I729 Aneurysm of unspecified site: Secondary | ICD-10-CM

## 2014-07-10 DIAGNOSIS — Z9889 Other specified postprocedural states: Secondary | ICD-10-CM | POA: Diagnosis not present

## 2014-07-10 DIAGNOSIS — I739 Peripheral vascular disease, unspecified: Secondary | ICD-10-CM | POA: Diagnosis not present

## 2014-07-10 LAB — POCT I-STAT CREATININE: CREATININE: 1.3 mg/dL — AB (ref 0.44–1.00)

## 2014-07-10 MED ORDER — IOHEXOL 350 MG/ML SOLN
80.0000 mL | Freq: Once | INTRAVENOUS | Status: AC | PRN
  Administered 2014-07-10: 80 mL via INTRAVENOUS

## 2014-11-17 ENCOUNTER — Telehealth (HOSPITAL_COMMUNITY): Payer: Self-pay | Admitting: Interventional Radiology

## 2014-11-17 NOTE — Telephone Encounter (Signed)
Pt returned our call from last wk. Let her know that she would be due for her next CTA head/neck in 1 yrs time. Pt states understanding and is in agreement with this plan of care. JM

## 2015-01-08 DIAGNOSIS — Z23 Encounter for immunization: Secondary | ICD-10-CM | POA: Diagnosis not present

## 2015-01-08 DIAGNOSIS — J069 Acute upper respiratory infection, unspecified: Secondary | ICD-10-CM | POA: Diagnosis not present

## 2015-01-08 DIAGNOSIS — Z1389 Encounter for screening for other disorder: Secondary | ICD-10-CM | POA: Diagnosis not present

## 2015-01-08 DIAGNOSIS — Z6841 Body Mass Index (BMI) 40.0 and over, adult: Secondary | ICD-10-CM | POA: Diagnosis not present

## 2015-01-08 DIAGNOSIS — I2699 Other pulmonary embolism without acute cor pulmonale: Secondary | ICD-10-CM | POA: Diagnosis not present

## 2015-01-08 DIAGNOSIS — M7989 Other specified soft tissue disorders: Secondary | ICD-10-CM | POA: Diagnosis not present

## 2015-01-08 DIAGNOSIS — I10 Essential (primary) hypertension: Secondary | ICD-10-CM | POA: Diagnosis not present

## 2015-01-22 DIAGNOSIS — Z79899 Other long term (current) drug therapy: Secondary | ICD-10-CM | POA: Diagnosis not present

## 2015-01-22 DIAGNOSIS — M7989 Other specified soft tissue disorders: Secondary | ICD-10-CM | POA: Diagnosis not present

## 2015-02-02 DIAGNOSIS — I6990 Unspecified sequelae of unspecified cerebrovascular disease: Secondary | ICD-10-CM | POA: Diagnosis not present

## 2015-02-02 DIAGNOSIS — I10 Essential (primary) hypertension: Secondary | ICD-10-CM | POA: Diagnosis not present

## 2015-02-02 DIAGNOSIS — I6529 Occlusion and stenosis of unspecified carotid artery: Secondary | ICD-10-CM | POA: Diagnosis not present

## 2015-02-02 DIAGNOSIS — G2581 Restless legs syndrome: Secondary | ICD-10-CM | POA: Diagnosis not present

## 2015-02-02 DIAGNOSIS — R5383 Other fatigue: Secondary | ICD-10-CM | POA: Diagnosis not present

## 2015-02-02 DIAGNOSIS — G4733 Obstructive sleep apnea (adult) (pediatric): Secondary | ICD-10-CM | POA: Diagnosis not present

## 2015-02-13 ENCOUNTER — Other Ambulatory Visit (HOSPITAL_COMMUNITY): Payer: Self-pay | Admitting: Respiratory Therapy

## 2015-02-13 DIAGNOSIS — G4733 Obstructive sleep apnea (adult) (pediatric): Secondary | ICD-10-CM

## 2015-02-13 DIAGNOSIS — R5383 Other fatigue: Secondary | ICD-10-CM

## 2015-02-13 DIAGNOSIS — I10 Essential (primary) hypertension: Secondary | ICD-10-CM

## 2015-02-13 DIAGNOSIS — G2581 Restless legs syndrome: Secondary | ICD-10-CM

## 2015-02-13 DIAGNOSIS — I6990 Unspecified sequelae of unspecified cerebrovascular disease: Secondary | ICD-10-CM

## 2015-02-13 DIAGNOSIS — I6529 Occlusion and stenosis of unspecified carotid artery: Secondary | ICD-10-CM

## 2015-02-19 DIAGNOSIS — G40909 Epilepsy, unspecified, not intractable, without status epilepticus: Secondary | ICD-10-CM | POA: Diagnosis not present

## 2015-02-19 DIAGNOSIS — Z789 Other specified health status: Secondary | ICD-10-CM | POA: Diagnosis not present

## 2015-02-19 DIAGNOSIS — I10 Essential (primary) hypertension: Secondary | ICD-10-CM | POA: Diagnosis not present

## 2015-03-16 DIAGNOSIS — I6990 Unspecified sequelae of unspecified cerebrovascular disease: Secondary | ICD-10-CM | POA: Diagnosis not present

## 2015-03-16 DIAGNOSIS — I10 Essential (primary) hypertension: Secondary | ICD-10-CM | POA: Diagnosis not present

## 2015-03-16 DIAGNOSIS — R5383 Other fatigue: Secondary | ICD-10-CM | POA: Diagnosis not present

## 2015-03-16 DIAGNOSIS — G4733 Obstructive sleep apnea (adult) (pediatric): Secondary | ICD-10-CM | POA: Diagnosis not present

## 2015-04-06 ENCOUNTER — Ambulatory Visit: Payer: Medicare Other | Attending: Neurology | Admitting: Sleep Medicine

## 2015-04-06 VITALS — Ht 65.0 in | Wt 226.0 lb

## 2015-04-06 DIAGNOSIS — I6990 Unspecified sequelae of unspecified cerebrovascular disease: Secondary | ICD-10-CM | POA: Insufficient documentation

## 2015-04-06 DIAGNOSIS — G4733 Obstructive sleep apnea (adult) (pediatric): Secondary | ICD-10-CM | POA: Diagnosis not present

## 2015-04-06 DIAGNOSIS — G2581 Restless legs syndrome: Secondary | ICD-10-CM | POA: Insufficient documentation

## 2015-04-06 DIAGNOSIS — I6529 Occlusion and stenosis of unspecified carotid artery: Secondary | ICD-10-CM | POA: Diagnosis not present

## 2015-04-06 DIAGNOSIS — R5383 Other fatigue: Secondary | ICD-10-CM | POA: Insufficient documentation

## 2015-04-06 DIAGNOSIS — I10 Essential (primary) hypertension: Secondary | ICD-10-CM | POA: Insufficient documentation

## 2015-04-21 NOTE — Sleep Study (Signed)
  McGrath A. Merlene Laughter, MD     [email or web address]             NOCTURNAL POLYSOMNOGRAPHY   LOCATION: [facility]   Patient Name: [name], [name] Date: 04/06/2015 Gender: Female D.O.B: 06/12/1950 Age (years): 36 Referring Provider: Not Available Height (inches): 65 Interpreting Physician: Phillips Odor MD, ABSM Weight (lbs): 236 RPSGT: Rosebud Poles BMI: 39 MRN: [id number] Neck Size: 15.50 CLINICAL INFORMATION Sleep Study Type: NPSG Indication for sleep study: Fatigue, Hypertension, Restless Sleep with Limb Movments Epworth Sleepiness Score: 3 SLEEP STUDY TECHNIQUE As per the AASM Manual for the Scoring of Sleep and Associated Events v2.3 (April 2016) with a hypopnea requiring 4% desaturations. The channels recorded and monitored were frontal, central and occipital EEG, electrooculogram (EOG), submentalis EMG (chin), nasal and oral airflow, thoracic and abdominal wall motion, anterior tibialis EMG, snore microphone, electrocardiogram, and pulse oximetry. MEDICATIONS Patient's medications include: N/A. Medications self-administered by patient during sleep study : No sleep medicine administered.   Current outpatient prescriptions:  .  allopurinol (ZYLOPRIM) 300 MG tablet, Take 150 mg by mouth every morning. , Disp: , Rfl:  .  amLODipine (NORVASC) 10 MG tablet, Take 1 tablet (10 mg total) by mouth daily., Disp: 30 tablet, Rfl: 0 .  citalopram (CELEXA) 20 MG tablet, Take 10 mg by mouth daily. , Disp: , Rfl:  .  hydrALAZINE (APRESOLINE) 25 MG tablet, Take 25 mg by mouth 3 (three) times daily. , Disp: , Rfl:  .  labetalol (NORMODYNE) 200 MG tablet, Take 400 mg by mouth daily. , Disp: , Rfl:  .  levETIRAcetam (KEPPRA) 500 MG tablet, Take 1 tablet (500 mg total) by mouth 2 (two) times daily., Disp: 60 tablet, Rfl: 3 .  pantoprazole (PROTONIX) 40 MG tablet, Take 40 mg by mouth daily., Disp: , Rfl:  .  potassium chloride SA (K-DUR,KLOR-CON) 20 MEQ tablet, Take  20 mEq by mouth daily. , Disp: , Rfl:  No current facility-administered medications for this visit.  Facility-Administered Medications Ordered in Other Visits:  .  sodium chloride 0.9 % injection 10-40 mL, 10-40 mL, Intracatheter, PRN, Erline Levine, MD, 20 mL at 09/09/11 0605  SLEEP ARCHITECTURE The study was initiated at 10:44:33 PM and ended at 5:49:17 AM. Sleep onset time was 72.3 minutes and the sleep efficiency was 56.4%. The total sleep time was 239.4 minutes. Stage REM latency was 333.0 minutes. The patient spent 6.06% of the night in stage N1 sleep, 64.54% in stage N2 sleep, 27.32% in stage N3 and 2.09% in REM. Alpha intrusion was absent. Supine sleep was 77.51%. RESPIRATORY PARAMETERS The overall apnea/hypopnea index (AHI) was 32.1 per hour. There were 17 total apneas, including 8 obstructive, 0 central and 9 mixed apneas. There were 111 hypopneas and 0 RERAs. The AHI during Stage REM sleep was 168.0 per hour. AHI while supine was 16.2 per hour. The mean oxygen saturation was 88.83%. The minimum SpO2 during sleep was 80.00%. Moderate snoring was noted during this study.  There were episodes of desaturaton without apneic events suggestive of hypoventilation.  CARDIAC DATA The 2 lead EKG demonstrated sinus rhythm. The mean heart rate was N/A beats per minute. Other EKG findings include: None. LEG MOVEMENT DATA The total PLMS were 0 with a resulting PLMS index of 0.00. Associated arousal with leg movement index was 0.0.    IMPRESSIONS - Moderate obstructive sleep apnea. - Hypoventilation syndrome.    Delano Metz, MD Diplomate, American Board of Sleep Medicine.

## 2015-04-23 DIAGNOSIS — I639 Cerebral infarction, unspecified: Secondary | ICD-10-CM | POA: Diagnosis not present

## 2015-04-23 DIAGNOSIS — I10 Essential (primary) hypertension: Secondary | ICD-10-CM | POA: Diagnosis not present

## 2015-04-30 DIAGNOSIS — R5383 Other fatigue: Secondary | ICD-10-CM | POA: Diagnosis not present

## 2015-04-30 DIAGNOSIS — G4733 Obstructive sleep apnea (adult) (pediatric): Secondary | ICD-10-CM | POA: Diagnosis not present

## 2015-04-30 DIAGNOSIS — I6529 Occlusion and stenosis of unspecified carotid artery: Secondary | ICD-10-CM | POA: Diagnosis not present

## 2015-04-30 DIAGNOSIS — I6990 Unspecified sequelae of unspecified cerebrovascular disease: Secondary | ICD-10-CM | POA: Diagnosis not present

## 2015-04-30 DIAGNOSIS — G2581 Restless legs syndrome: Secondary | ICD-10-CM | POA: Diagnosis not present

## 2015-04-30 DIAGNOSIS — I10 Essential (primary) hypertension: Secondary | ICD-10-CM | POA: Diagnosis not present

## 2015-05-13 DIAGNOSIS — I639 Cerebral infarction, unspecified: Secondary | ICD-10-CM | POA: Diagnosis not present

## 2015-05-13 DIAGNOSIS — I10 Essential (primary) hypertension: Secondary | ICD-10-CM | POA: Diagnosis not present

## 2015-05-19 DIAGNOSIS — G40909 Epilepsy, unspecified, not intractable, without status epilepticus: Secondary | ICD-10-CM | POA: Diagnosis not present

## 2015-05-19 DIAGNOSIS — I10 Essential (primary) hypertension: Secondary | ICD-10-CM | POA: Diagnosis not present

## 2015-05-19 DIAGNOSIS — I2699 Other pulmonary embolism without acute cor pulmonale: Secondary | ICD-10-CM | POA: Diagnosis not present

## 2015-06-22 DIAGNOSIS — I639 Cerebral infarction, unspecified: Secondary | ICD-10-CM | POA: Diagnosis not present

## 2015-06-22 DIAGNOSIS — I10 Essential (primary) hypertension: Secondary | ICD-10-CM | POA: Diagnosis not present

## 2015-07-21 DIAGNOSIS — I639 Cerebral infarction, unspecified: Secondary | ICD-10-CM | POA: Diagnosis not present

## 2015-07-21 DIAGNOSIS — I10 Essential (primary) hypertension: Secondary | ICD-10-CM | POA: Diagnosis not present

## 2015-07-23 DIAGNOSIS — Z1231 Encounter for screening mammogram for malignant neoplasm of breast: Secondary | ICD-10-CM | POA: Diagnosis not present

## 2015-07-28 DIAGNOSIS — Z79899 Other long term (current) drug therapy: Secondary | ICD-10-CM | POA: Diagnosis not present

## 2015-07-28 DIAGNOSIS — Z299 Encounter for prophylactic measures, unspecified: Secondary | ICD-10-CM | POA: Diagnosis not present

## 2015-07-28 DIAGNOSIS — Z1389 Encounter for screening for other disorder: Secondary | ICD-10-CM | POA: Diagnosis not present

## 2015-07-28 DIAGNOSIS — Z1211 Encounter for screening for malignant neoplasm of colon: Secondary | ICD-10-CM | POA: Diagnosis not present

## 2015-07-28 DIAGNOSIS — R5383 Other fatigue: Secondary | ICD-10-CM | POA: Diagnosis not present

## 2015-07-28 DIAGNOSIS — Z0000 Encounter for general adult medical examination without abnormal findings: Secondary | ICD-10-CM | POA: Diagnosis not present

## 2015-07-31 DIAGNOSIS — I10 Essential (primary) hypertension: Secondary | ICD-10-CM | POA: Diagnosis not present

## 2015-07-31 DIAGNOSIS — I6529 Occlusion and stenosis of unspecified carotid artery: Secondary | ICD-10-CM | POA: Diagnosis not present

## 2015-07-31 DIAGNOSIS — R5383 Other fatigue: Secondary | ICD-10-CM | POA: Diagnosis not present

## 2015-07-31 DIAGNOSIS — I6990 Unspecified sequelae of unspecified cerebrovascular disease: Secondary | ICD-10-CM | POA: Diagnosis not present

## 2015-07-31 DIAGNOSIS — G2581 Restless legs syndrome: Secondary | ICD-10-CM | POA: Diagnosis not present

## 2015-07-31 DIAGNOSIS — G4733 Obstructive sleep apnea (adult) (pediatric): Secondary | ICD-10-CM | POA: Diagnosis not present

## 2015-08-18 ENCOUNTER — Other Ambulatory Visit (HOSPITAL_COMMUNITY): Payer: Self-pay | Admitting: Interventional Radiology

## 2015-08-18 DIAGNOSIS — I729 Aneurysm of unspecified site: Secondary | ICD-10-CM

## 2015-08-24 DIAGNOSIS — R195 Other fecal abnormalities: Secondary | ICD-10-CM | POA: Diagnosis not present

## 2015-08-28 DIAGNOSIS — Z8249 Family history of ischemic heart disease and other diseases of the circulatory system: Secondary | ICD-10-CM | POA: Diagnosis not present

## 2015-08-28 DIAGNOSIS — K219 Gastro-esophageal reflux disease without esophagitis: Secondary | ICD-10-CM | POA: Diagnosis not present

## 2015-08-28 DIAGNOSIS — R195 Other fecal abnormalities: Secondary | ICD-10-CM | POA: Diagnosis not present

## 2015-08-28 DIAGNOSIS — F329 Major depressive disorder, single episode, unspecified: Secondary | ICD-10-CM | POA: Diagnosis not present

## 2015-08-28 DIAGNOSIS — D122 Benign neoplasm of ascending colon: Secondary | ICD-10-CM | POA: Diagnosis not present

## 2015-08-28 DIAGNOSIS — Z8489 Family history of other specified conditions: Secondary | ICD-10-CM | POA: Diagnosis not present

## 2015-08-28 DIAGNOSIS — Z880 Allergy status to penicillin: Secondary | ICD-10-CM | POA: Diagnosis not present

## 2015-08-28 DIAGNOSIS — Z79899 Other long term (current) drug therapy: Secondary | ICD-10-CM | POA: Diagnosis not present

## 2015-08-28 DIAGNOSIS — I10 Essential (primary) hypertension: Secondary | ICD-10-CM | POA: Diagnosis not present

## 2015-08-28 DIAGNOSIS — I679 Cerebrovascular disease, unspecified: Secondary | ICD-10-CM | POA: Diagnosis not present

## 2015-08-28 DIAGNOSIS — D124 Benign neoplasm of descending colon: Secondary | ICD-10-CM | POA: Diagnosis not present

## 2015-08-28 DIAGNOSIS — M109 Gout, unspecified: Secondary | ICD-10-CM | POA: Diagnosis not present

## 2015-08-30 ENCOUNTER — Other Ambulatory Visit (HOSPITAL_COMMUNITY): Payer: Self-pay | Admitting: Interventional Radiology

## 2015-08-31 ENCOUNTER — Encounter (HOSPITAL_COMMUNITY): Payer: Self-pay

## 2015-08-31 ENCOUNTER — Ambulatory Visit (HOSPITAL_COMMUNITY)
Admission: RE | Admit: 2015-08-31 | Discharge: 2015-08-31 | Disposition: A | Discharge status: Home / Self Care | Payer: Medicare Other | Source: Ambulatory Visit | Attending: Interventional Radiology | Admitting: Interventional Radiology

## 2015-08-31 ENCOUNTER — Ambulatory Visit (HOSPITAL_COMMUNITY): Payer: Medicare Other

## 2015-08-31 DIAGNOSIS — I708 Atherosclerosis of other arteries: Secondary | ICD-10-CM | POA: Insufficient documentation

## 2015-08-31 DIAGNOSIS — I671 Cerebral aneurysm, nonruptured: Secondary | ICD-10-CM | POA: Insufficient documentation

## 2015-08-31 DIAGNOSIS — I729 Aneurysm of unspecified site: Secondary | ICD-10-CM | POA: Diagnosis present

## 2015-08-31 LAB — POCT I-STAT CREATININE: CREATININE: 1.2 mg/dL — AB (ref 0.44–1.00)

## 2015-08-31 MED ORDER — IOPAMIDOL (ISOVUE-370) INJECTION 76%
INTRAVENOUS | Status: AC
  Administered 2015-08-31: 50 mL
  Filled 2015-08-31: qty 50

## 2015-09-03 DIAGNOSIS — D124 Benign neoplasm of descending colon: Secondary | ICD-10-CM | POA: Diagnosis not present

## 2015-09-03 DIAGNOSIS — I10 Essential (primary) hypertension: Secondary | ICD-10-CM | POA: Diagnosis not present

## 2015-09-03 DIAGNOSIS — D122 Benign neoplasm of ascending colon: Secondary | ICD-10-CM | POA: Diagnosis not present

## 2015-09-03 DIAGNOSIS — I639 Cerebral infarction, unspecified: Secondary | ICD-10-CM | POA: Diagnosis not present

## 2015-09-11 ENCOUNTER — Telehealth (HOSPITAL_COMMUNITY): Payer: Self-pay | Admitting: Radiology

## 2015-09-11 NOTE — Telephone Encounter (Signed)
Called pt on her cell phone, left VM that per Deveshwar she should follow-up in 1 year with a cerebral angiogram. Also tried pt's home phone, no answer and no VM JM

## 2015-09-17 DIAGNOSIS — Z23 Encounter for immunization: Secondary | ICD-10-CM | POA: Diagnosis not present

## 2015-09-17 DIAGNOSIS — E2839 Other primary ovarian failure: Secondary | ICD-10-CM | POA: Diagnosis not present

## 2015-10-28 DIAGNOSIS — Z6841 Body Mass Index (BMI) 40.0 and over, adult: Secondary | ICD-10-CM | POA: Diagnosis not present

## 2015-10-28 DIAGNOSIS — G40909 Epilepsy, unspecified, not intractable, without status epilepticus: Secondary | ICD-10-CM | POA: Diagnosis not present

## 2015-10-28 DIAGNOSIS — I2699 Other pulmonary embolism without acute cor pulmonale: Secondary | ICD-10-CM | POA: Diagnosis not present

## 2015-10-28 DIAGNOSIS — Z299 Encounter for prophylactic measures, unspecified: Secondary | ICD-10-CM | POA: Diagnosis not present

## 2015-10-28 DIAGNOSIS — I10 Essential (primary) hypertension: Secondary | ICD-10-CM | POA: Diagnosis not present

## 2016-01-20 DIAGNOSIS — I10 Essential (primary) hypertension: Secondary | ICD-10-CM | POA: Diagnosis not present

## 2016-01-20 DIAGNOSIS — I639 Cerebral infarction, unspecified: Secondary | ICD-10-CM | POA: Diagnosis not present

## 2016-01-29 DIAGNOSIS — Z6841 Body Mass Index (BMI) 40.0 and over, adult: Secondary | ICD-10-CM | POA: Diagnosis not present

## 2016-01-29 DIAGNOSIS — I10 Essential (primary) hypertension: Secondary | ICD-10-CM | POA: Diagnosis not present

## 2016-01-29 DIAGNOSIS — Z713 Dietary counseling and surveillance: Secondary | ICD-10-CM | POA: Diagnosis not present

## 2016-01-29 DIAGNOSIS — G40909 Epilepsy, unspecified, not intractable, without status epilepticus: Secondary | ICD-10-CM | POA: Diagnosis not present

## 2016-01-29 DIAGNOSIS — I2699 Other pulmonary embolism without acute cor pulmonale: Secondary | ICD-10-CM | POA: Diagnosis not present

## 2016-02-01 DIAGNOSIS — R5383 Other fatigue: Secondary | ICD-10-CM | POA: Diagnosis not present

## 2016-02-01 DIAGNOSIS — G2581 Restless legs syndrome: Secondary | ICD-10-CM | POA: Diagnosis not present

## 2016-02-01 DIAGNOSIS — G4733 Obstructive sleep apnea (adult) (pediatric): Secondary | ICD-10-CM | POA: Diagnosis not present

## 2016-02-01 DIAGNOSIS — I6990 Unspecified sequelae of unspecified cerebrovascular disease: Secondary | ICD-10-CM | POA: Diagnosis not present

## 2016-02-01 DIAGNOSIS — I6529 Occlusion and stenosis of unspecified carotid artery: Secondary | ICD-10-CM | POA: Diagnosis not present

## 2016-02-01 DIAGNOSIS — I10 Essential (primary) hypertension: Secondary | ICD-10-CM | POA: Diagnosis not present

## 2016-02-08 DIAGNOSIS — H2513 Age-related nuclear cataract, bilateral: Secondary | ICD-10-CM | POA: Diagnosis not present

## 2016-02-08 DIAGNOSIS — H5203 Hypermetropia, bilateral: Secondary | ICD-10-CM | POA: Diagnosis not present

## 2016-02-08 DIAGNOSIS — H524 Presbyopia: Secondary | ICD-10-CM | POA: Diagnosis not present

## 2016-02-15 DIAGNOSIS — I10 Essential (primary) hypertension: Secondary | ICD-10-CM | POA: Diagnosis not present

## 2016-02-15 DIAGNOSIS — Z789 Other specified health status: Secondary | ICD-10-CM | POA: Diagnosis not present

## 2016-02-15 DIAGNOSIS — G40909 Epilepsy, unspecified, not intractable, without status epilepticus: Secondary | ICD-10-CM | POA: Diagnosis not present

## 2016-02-15 DIAGNOSIS — I2699 Other pulmonary embolism without acute cor pulmonale: Secondary | ICD-10-CM | POA: Diagnosis not present

## 2016-02-15 DIAGNOSIS — J069 Acute upper respiratory infection, unspecified: Secondary | ICD-10-CM | POA: Diagnosis not present

## 2016-02-15 DIAGNOSIS — F909 Attention-deficit hyperactivity disorder, unspecified type: Secondary | ICD-10-CM | POA: Diagnosis not present

## 2016-02-15 DIAGNOSIS — I679 Cerebrovascular disease, unspecified: Secondary | ICD-10-CM | POA: Diagnosis not present

## 2016-02-15 DIAGNOSIS — Z6841 Body Mass Index (BMI) 40.0 and over, adult: Secondary | ICD-10-CM | POA: Diagnosis not present

## 2016-02-15 DIAGNOSIS — Z299 Encounter for prophylactic measures, unspecified: Secondary | ICD-10-CM | POA: Diagnosis not present

## 2016-02-29 DIAGNOSIS — I10 Essential (primary) hypertension: Secondary | ICD-10-CM | POA: Diagnosis not present

## 2016-02-29 DIAGNOSIS — I639 Cerebral infarction, unspecified: Secondary | ICD-10-CM | POA: Diagnosis not present

## 2016-04-27 DIAGNOSIS — I10 Essential (primary) hypertension: Secondary | ICD-10-CM | POA: Diagnosis not present

## 2016-04-27 DIAGNOSIS — I639 Cerebral infarction, unspecified: Secondary | ICD-10-CM | POA: Diagnosis not present

## 2016-04-28 DIAGNOSIS — M109 Gout, unspecified: Secondary | ICD-10-CM | POA: Diagnosis not present

## 2016-04-28 DIAGNOSIS — Z6841 Body Mass Index (BMI) 40.0 and over, adult: Secondary | ICD-10-CM | POA: Diagnosis not present

## 2016-04-28 DIAGNOSIS — I10 Essential (primary) hypertension: Secondary | ICD-10-CM | POA: Diagnosis not present

## 2016-04-28 DIAGNOSIS — I679 Cerebrovascular disease, unspecified: Secondary | ICD-10-CM | POA: Diagnosis not present

## 2016-04-28 DIAGNOSIS — F909 Attention-deficit hyperactivity disorder, unspecified type: Secondary | ICD-10-CM | POA: Diagnosis not present

## 2016-04-28 DIAGNOSIS — Z299 Encounter for prophylactic measures, unspecified: Secondary | ICD-10-CM | POA: Diagnosis not present

## 2016-04-28 DIAGNOSIS — F329 Major depressive disorder, single episode, unspecified: Secondary | ICD-10-CM | POA: Diagnosis not present

## 2016-04-28 DIAGNOSIS — I2699 Other pulmonary embolism without acute cor pulmonale: Secondary | ICD-10-CM | POA: Diagnosis not present

## 2016-04-28 DIAGNOSIS — G40909 Epilepsy, unspecified, not intractable, without status epilepticus: Secondary | ICD-10-CM | POA: Diagnosis not present

## 2016-04-28 DIAGNOSIS — I639 Cerebral infarction, unspecified: Secondary | ICD-10-CM | POA: Diagnosis not present

## 2016-04-28 DIAGNOSIS — K219 Gastro-esophageal reflux disease without esophagitis: Secondary | ICD-10-CM | POA: Diagnosis not present

## 2016-05-26 DIAGNOSIS — I10 Essential (primary) hypertension: Secondary | ICD-10-CM | POA: Diagnosis not present

## 2016-05-26 DIAGNOSIS — I639 Cerebral infarction, unspecified: Secondary | ICD-10-CM | POA: Diagnosis not present

## 2016-06-13 DIAGNOSIS — R0600 Dyspnea, unspecified: Secondary | ICD-10-CM | POA: Diagnosis not present

## 2016-06-13 DIAGNOSIS — R0602 Shortness of breath: Secondary | ICD-10-CM | POA: Diagnosis not present

## 2016-06-13 DIAGNOSIS — I2699 Other pulmonary embolism without acute cor pulmonale: Secondary | ICD-10-CM | POA: Diagnosis not present

## 2016-06-13 DIAGNOSIS — G40909 Epilepsy, unspecified, not intractable, without status epilepticus: Secondary | ICD-10-CM | POA: Diagnosis not present

## 2016-06-13 DIAGNOSIS — Z299 Encounter for prophylactic measures, unspecified: Secondary | ICD-10-CM | POA: Diagnosis not present

## 2016-06-13 DIAGNOSIS — M7989 Other specified soft tissue disorders: Secondary | ICD-10-CM | POA: Diagnosis not present

## 2016-06-13 DIAGNOSIS — Z713 Dietary counseling and surveillance: Secondary | ICD-10-CM | POA: Diagnosis not present

## 2016-06-13 DIAGNOSIS — I639 Cerebral infarction, unspecified: Secondary | ICD-10-CM | POA: Diagnosis not present

## 2016-06-13 DIAGNOSIS — R5383 Other fatigue: Secondary | ICD-10-CM | POA: Diagnosis not present

## 2016-06-13 DIAGNOSIS — Z6841 Body Mass Index (BMI) 40.0 and over, adult: Secondary | ICD-10-CM | POA: Diagnosis not present

## 2016-06-13 DIAGNOSIS — J209 Acute bronchitis, unspecified: Secondary | ICD-10-CM | POA: Diagnosis not present

## 2016-06-15 DIAGNOSIS — J209 Acute bronchitis, unspecified: Secondary | ICD-10-CM | POA: Diagnosis not present

## 2016-06-15 DIAGNOSIS — I10 Essential (primary) hypertension: Secondary | ICD-10-CM | POA: Diagnosis not present

## 2016-06-15 DIAGNOSIS — K219 Gastro-esophageal reflux disease without esophagitis: Secondary | ICD-10-CM | POA: Diagnosis not present

## 2016-06-15 DIAGNOSIS — Z6841 Body Mass Index (BMI) 40.0 and over, adult: Secondary | ICD-10-CM | POA: Diagnosis not present

## 2016-06-15 DIAGNOSIS — Z299 Encounter for prophylactic measures, unspecified: Secondary | ICD-10-CM | POA: Diagnosis not present

## 2016-06-20 DIAGNOSIS — I679 Cerebrovascular disease, unspecified: Secondary | ICD-10-CM | POA: Diagnosis not present

## 2016-06-20 DIAGNOSIS — M109 Gout, unspecified: Secondary | ICD-10-CM | POA: Diagnosis not present

## 2016-06-20 DIAGNOSIS — G40909 Epilepsy, unspecified, not intractable, without status epilepticus: Secondary | ICD-10-CM | POA: Diagnosis not present

## 2016-06-20 DIAGNOSIS — Z299 Encounter for prophylactic measures, unspecified: Secondary | ICD-10-CM | POA: Diagnosis not present

## 2016-06-20 DIAGNOSIS — J209 Acute bronchitis, unspecified: Secondary | ICD-10-CM | POA: Diagnosis not present

## 2016-06-20 DIAGNOSIS — K219 Gastro-esophageal reflux disease without esophagitis: Secondary | ICD-10-CM | POA: Diagnosis not present

## 2016-06-20 DIAGNOSIS — I639 Cerebral infarction, unspecified: Secondary | ICD-10-CM | POA: Diagnosis not present

## 2016-06-20 DIAGNOSIS — Z789 Other specified health status: Secondary | ICD-10-CM | POA: Diagnosis not present

## 2016-06-20 DIAGNOSIS — Z6841 Body Mass Index (BMI) 40.0 and over, adult: Secondary | ICD-10-CM | POA: Diagnosis not present

## 2016-06-20 DIAGNOSIS — I10 Essential (primary) hypertension: Secondary | ICD-10-CM | POA: Diagnosis not present

## 2016-07-08 DIAGNOSIS — I639 Cerebral infarction, unspecified: Secondary | ICD-10-CM | POA: Diagnosis not present

## 2016-07-08 DIAGNOSIS — I10 Essential (primary) hypertension: Secondary | ICD-10-CM | POA: Diagnosis not present

## 2016-07-26 DIAGNOSIS — Z1231 Encounter for screening mammogram for malignant neoplasm of breast: Secondary | ICD-10-CM | POA: Diagnosis not present

## 2016-07-28 DIAGNOSIS — I6529 Occlusion and stenosis of unspecified carotid artery: Secondary | ICD-10-CM | POA: Diagnosis not present

## 2016-07-28 DIAGNOSIS — R5383 Other fatigue: Secondary | ICD-10-CM | POA: Diagnosis not present

## 2016-07-28 DIAGNOSIS — I6990 Unspecified sequelae of unspecified cerebrovascular disease: Secondary | ICD-10-CM | POA: Diagnosis not present

## 2016-07-28 DIAGNOSIS — I10 Essential (primary) hypertension: Secondary | ICD-10-CM | POA: Diagnosis not present

## 2016-07-28 DIAGNOSIS — G4733 Obstructive sleep apnea (adult) (pediatric): Secondary | ICD-10-CM | POA: Diagnosis not present

## 2016-07-28 DIAGNOSIS — G2581 Restless legs syndrome: Secondary | ICD-10-CM | POA: Diagnosis not present

## 2016-08-08 DIAGNOSIS — I2699 Other pulmonary embolism without acute cor pulmonale: Secondary | ICD-10-CM | POA: Diagnosis not present

## 2016-08-08 DIAGNOSIS — Z299 Encounter for prophylactic measures, unspecified: Secondary | ICD-10-CM | POA: Diagnosis not present

## 2016-08-08 DIAGNOSIS — Z79899 Other long term (current) drug therapy: Secondary | ICD-10-CM | POA: Diagnosis not present

## 2016-08-08 DIAGNOSIS — F329 Major depressive disorder, single episode, unspecified: Secondary | ICD-10-CM | POA: Diagnosis not present

## 2016-08-08 DIAGNOSIS — Z1389 Encounter for screening for other disorder: Secondary | ICD-10-CM | POA: Diagnosis not present

## 2016-08-08 DIAGNOSIS — G40909 Epilepsy, unspecified, not intractable, without status epilepticus: Secondary | ICD-10-CM | POA: Diagnosis not present

## 2016-08-08 DIAGNOSIS — Z6841 Body Mass Index (BMI) 40.0 and over, adult: Secondary | ICD-10-CM | POA: Diagnosis not present

## 2016-08-08 DIAGNOSIS — R5383 Other fatigue: Secondary | ICD-10-CM | POA: Diagnosis not present

## 2016-08-08 DIAGNOSIS — Z7189 Other specified counseling: Secondary | ICD-10-CM | POA: Diagnosis not present

## 2016-08-08 DIAGNOSIS — M7989 Other specified soft tissue disorders: Secondary | ICD-10-CM | POA: Diagnosis not present

## 2016-08-08 DIAGNOSIS — I10 Essential (primary) hypertension: Secondary | ICD-10-CM | POA: Diagnosis not present

## 2016-08-08 DIAGNOSIS — R600 Localized edema: Secondary | ICD-10-CM | POA: Diagnosis not present

## 2016-08-08 DIAGNOSIS — Z0000 Encounter for general adult medical examination without abnormal findings: Secondary | ICD-10-CM | POA: Diagnosis not present

## 2016-08-31 DIAGNOSIS — I639 Cerebral infarction, unspecified: Secondary | ICD-10-CM | POA: Diagnosis not present

## 2016-08-31 DIAGNOSIS — I10 Essential (primary) hypertension: Secondary | ICD-10-CM | POA: Diagnosis not present

## 2016-09-09 ENCOUNTER — Telehealth (HOSPITAL_COMMUNITY): Payer: Self-pay

## 2016-09-09 NOTE — Telephone Encounter (Signed)
Called to schedule 1 yr f/u angio, no answer, left message for pt to return call. AW

## 2016-10-04 DIAGNOSIS — I10 Essential (primary) hypertension: Secondary | ICD-10-CM | POA: Diagnosis not present

## 2016-10-04 DIAGNOSIS — I639 Cerebral infarction, unspecified: Secondary | ICD-10-CM | POA: Diagnosis not present

## 2016-10-11 DIAGNOSIS — Z23 Encounter for immunization: Secondary | ICD-10-CM | POA: Diagnosis not present

## 2016-11-07 DIAGNOSIS — I639 Cerebral infarction, unspecified: Secondary | ICD-10-CM | POA: Diagnosis not present

## 2016-11-07 DIAGNOSIS — I10 Essential (primary) hypertension: Secondary | ICD-10-CM | POA: Diagnosis not present

## 2016-11-08 ENCOUNTER — Other Ambulatory Visit (HOSPITAL_COMMUNITY): Payer: Self-pay | Admitting: Interventional Radiology

## 2016-11-08 DIAGNOSIS — I729 Aneurysm of unspecified site: Secondary | ICD-10-CM

## 2016-11-09 DIAGNOSIS — Z299 Encounter for prophylactic measures, unspecified: Secondary | ICD-10-CM | POA: Diagnosis not present

## 2016-11-09 DIAGNOSIS — Z713 Dietary counseling and surveillance: Secondary | ICD-10-CM | POA: Diagnosis not present

## 2016-11-09 DIAGNOSIS — Z6841 Body Mass Index (BMI) 40.0 and over, adult: Secondary | ICD-10-CM | POA: Diagnosis not present

## 2016-11-09 DIAGNOSIS — I10 Essential (primary) hypertension: Secondary | ICD-10-CM | POA: Diagnosis not present

## 2016-11-09 DIAGNOSIS — I679 Cerebrovascular disease, unspecified: Secondary | ICD-10-CM | POA: Diagnosis not present

## 2016-11-21 ENCOUNTER — Other Ambulatory Visit: Payer: Self-pay | Admitting: Radiology

## 2016-11-22 ENCOUNTER — Encounter (HOSPITAL_COMMUNITY): Payer: Self-pay

## 2016-11-22 ENCOUNTER — Ambulatory Visit (HOSPITAL_COMMUNITY)
Admission: RE | Admit: 2016-11-22 | Discharge: 2016-11-22 | Disposition: A | Discharge status: Home / Self Care | Payer: Medicare Other | Source: Ambulatory Visit | Attending: Interventional Radiology | Admitting: Interventional Radiology

## 2016-11-22 DIAGNOSIS — Z8673 Personal history of transient ischemic attack (TIA), and cerebral infarction without residual deficits: Secondary | ICD-10-CM | POA: Insufficient documentation

## 2016-11-22 DIAGNOSIS — Z713 Dietary counseling and surveillance: Secondary | ICD-10-CM | POA: Diagnosis not present

## 2016-11-22 DIAGNOSIS — Z87440 Personal history of urinary (tract) infections: Secondary | ICD-10-CM | POA: Insufficient documentation

## 2016-11-22 DIAGNOSIS — K219 Gastro-esophageal reflux disease without esophagitis: Secondary | ICD-10-CM | POA: Insufficient documentation

## 2016-11-22 DIAGNOSIS — R600 Localized edema: Secondary | ICD-10-CM | POA: Diagnosis not present

## 2016-11-22 DIAGNOSIS — Z86711 Personal history of pulmonary embolism: Secondary | ICD-10-CM | POA: Insufficient documentation

## 2016-11-22 DIAGNOSIS — Z6841 Body Mass Index (BMI) 40.0 and over, adult: Secondary | ICD-10-CM | POA: Diagnosis not present

## 2016-11-22 DIAGNOSIS — E119 Type 2 diabetes mellitus without complications: Secondary | ICD-10-CM | POA: Insufficient documentation

## 2016-11-22 DIAGNOSIS — I671 Cerebral aneurysm, nonruptured: Secondary | ICD-10-CM | POA: Diagnosis not present

## 2016-11-22 DIAGNOSIS — Z299 Encounter for prophylactic measures, unspecified: Secondary | ICD-10-CM | POA: Diagnosis not present

## 2016-11-22 DIAGNOSIS — Z539 Procedure and treatment not carried out, unspecified reason: Secondary | ICD-10-CM | POA: Insufficient documentation

## 2016-11-22 DIAGNOSIS — Z982 Presence of cerebrospinal fluid drainage device: Secondary | ICD-10-CM | POA: Diagnosis not present

## 2016-11-22 DIAGNOSIS — Z880 Allergy status to penicillin: Secondary | ICD-10-CM | POA: Insufficient documentation

## 2016-11-22 DIAGNOSIS — F329 Major depressive disorder, single episode, unspecified: Secondary | ICD-10-CM | POA: Insufficient documentation

## 2016-11-22 DIAGNOSIS — Z48812 Encounter for surgical aftercare following surgery on the circulatory system: Secondary | ICD-10-CM | POA: Diagnosis not present

## 2016-11-22 DIAGNOSIS — I729 Aneurysm of unspecified site: Secondary | ICD-10-CM

## 2016-11-22 DIAGNOSIS — Z8679 Personal history of other diseases of the circulatory system: Secondary | ICD-10-CM | POA: Insufficient documentation

## 2016-11-22 DIAGNOSIS — M7989 Other specified soft tissue disorders: Secondary | ICD-10-CM | POA: Insufficient documentation

## 2016-11-22 DIAGNOSIS — G40909 Epilepsy, unspecified, not intractable, without status epilepticus: Secondary | ICD-10-CM | POA: Diagnosis not present

## 2016-11-22 DIAGNOSIS — I10 Essential (primary) hypertension: Secondary | ICD-10-CM | POA: Diagnosis not present

## 2016-11-22 LAB — CBC
HCT: 34.5 % — ABNORMAL LOW (ref 36.0–46.0)
Hemoglobin: 10.3 g/dL — ABNORMAL LOW (ref 12.0–15.0)
MCH: 25 pg — ABNORMAL LOW (ref 26.0–34.0)
MCHC: 29.9 g/dL — ABNORMAL LOW (ref 30.0–36.0)
MCV: 83.7 fL (ref 78.0–100.0)
Platelets: 245 10*3/uL (ref 150–400)
RBC: 4.12 MIL/uL (ref 3.87–5.11)
RDW: 16.8 % — ABNORMAL HIGH (ref 11.5–15.5)
WBC: 4.8 10*3/uL (ref 4.0–10.5)

## 2016-11-22 LAB — BASIC METABOLIC PANEL WITH GFR
Anion gap: 8 (ref 5–15)
BUN: 11 mg/dL (ref 6–20)
CO2: 28 mmol/L (ref 22–32)
Calcium: 9 mg/dL (ref 8.9–10.3)
Chloride: 104 mmol/L (ref 101–111)
Creatinine, Ser: 1.09 mg/dL — ABNORMAL HIGH (ref 0.44–1.00)
GFR calc Af Amer: 59 mL/min — ABNORMAL LOW
GFR calc non Af Amer: 51 mL/min — ABNORMAL LOW
Glucose, Bld: 112 mg/dL — ABNORMAL HIGH (ref 65–99)
Potassium: 3.8 mmol/L (ref 3.5–5.1)
Sodium: 140 mmol/L (ref 135–145)

## 2016-11-22 LAB — PROTIME-INR
INR: 0.94
Prothrombin Time: 12.5 seconds (ref 11.4–15.2)

## 2016-11-22 LAB — APTT: aPTT: 36 seconds (ref 24–36)

## 2016-11-22 MED ORDER — FENTANYL CITRATE (PF) 100 MCG/2ML IJ SOLN
INTRAMUSCULAR | Status: AC
  Filled 2016-11-22: qty 2

## 2016-11-22 MED ORDER — SODIUM CHLORIDE 0.9 % IV SOLN
Freq: Once | INTRAVENOUS | Status: AC
  Administered 2016-11-22: 07:00:00 via INTRAVENOUS

## 2016-11-22 MED ORDER — MIDAZOLAM HCL 2 MG/2ML IJ SOLN
INTRAMUSCULAR | Status: AC
  Filled 2016-11-22: qty 2

## 2016-11-22 MED ORDER — HEPARIN SODIUM (PORCINE) 1000 UNIT/ML IJ SOLN
INTRAMUSCULAR | Status: AC
  Filled 2016-11-22: qty 1

## 2016-11-22 NOTE — H&P (Signed)
Chief Complaint: Patient was seen in consultation today for cerebral aneurysm  Supervising Physician: Luanne Bras  Patient Status: [facility] - Out-pt  History of Present Illness: [name] is a 66 y.o. female with past medical history of SAH s/p coiling of ACA aneurysm and VP shunt in 2013.  She was last seen by Dr. Estanislado Pandy for angiogram in 2015. At that time, angiogram showed: -Stable, previously coil ruptured anterior communicating artery region aneurysm without evidence of coil compaction or of recanalization. -Stable neck remnant measuring approximately 3.1 mm x 2.8 mm.  Patient presents today for follow-up angiogram. She presents in her usual state of health.  She denies headaches, blurry vision, mentation changes, weakness, or trouble with gait.  She has been NPO.   Past Medical History:  Diagnosis Date  . Attention deficit disorder of childhood with hyperactivity   . Depression   . Esophageal reflux   . Essential hypertension, benign   . PE (pulmonary embolism)    Status post IVC placement  . Recurrent UTI   . SAH (subarachnoid hemorrhage) ([place]) 08/16/11   Coiling of ACA aneurysm, VP shunt  . Seizure disorder ([place])   . Stroke ([place])   . Type 2 diabetes mellitus ([place])     Past Surgical History:  Procedure Laterality Date  . ABDOMINAL HYSTERECTOMY    . ANEURYSM COILING  08/16/11  . LAPAROSCOPIC REVISION VENTRICULAR-PERITONEAL (V-P) SHUNT N/A 09/02/2011   Performed by Odis Hollingshead, MD at [facility] NEURO ORS  . RADIOLOGY WITH ANESTHESIA N/A 08/19/2011   Performed by Rob Hickman, MD at [facility] 09/02/2011   Performed by Erline Levine, MD at [facility] NEURO ORS    Allergies: Penicillins  Medications: Prior to Admission medications   Medication Sig Start Date End Date Taking? Authorizing Provider  amLODipine (NORVASC) 10 MG tablet Take 1 tablet (10 mg total) by mouth daily. 02/12/12  Yes Dhungel, Nishant, MD   citalopram (CELEXA) 20 MG tablet Take 20 mg daily by mouth.    Yes [provider]  labetalol (NORMODYNE) 200 MG tablet Take 400 mg by mouth daily.    Yes [provider]  levETIRAcetam (KEPPRA) 500 MG tablet Take 1 tablet (500 mg total) by mouth 2 (two) times daily. 02/23/12  Yes Meredith Staggers, MD  pantoprazole (PROTONIX) 40 MG tablet Take 40 mg by mouth daily.   Yes [provider]  potassium chloride SA (K-DUR,KLOR-CON) 20 MEQ tablet Take 20 mEq by mouth daily.    Yes [provider]  allopurinol (ZYLOPRIM) 300 MG tablet Take 150 mg by mouth every morning.     [provider]  ferrous sulfate 325 (65 FE) MG tablet Take 325 mg daily with breakfast by mouth.    [provider]  hydrALAZINE (APRESOLINE) 25 MG tablet Take 25 mg by mouth 3 (three) times daily.     [provider]     Family History  Problem Relation Age of Onset  . Hypertension Unknown     Social History   Socioeconomic History  . Marital status: Married    Spouse name: None  . Number of children: None  . Years of education: None  . Highest education level: None  Social Needs  . Financial resource strain: None  . Food insecurity - worry: None  . Food insecurity - inability: None  . Transportation needs - medical: None  . Transportation needs - non-medical: None  Occupational History  . Occupation:  Disability    Comment: Retired from [employer] and [employer]  . Smoking status: Never Smoker  . Smokeless tobacco: Never Used  Substance and Sexual Activity  . Alcohol use: No  . Drug use: No  . Sexual activity: None  Other Topics Concern  . None  Social History Narrative  . None    Review of Systems  Constitutional: Negative for fatigue and fever.  Respiratory: Negative for cough and shortness of breath.   Cardiovascular: Negative for chest pain.  Gastrointestinal: Negative for abdominal pain.  Neurological: Negative for weakness,  light-headedness and headaches.  Psychiatric/Behavioral: Negative for behavioral problems and confusion.    Vital Signs: BP 125/73   Pulse 62   Temp 98 F (36.7 C) (Oral)   Resp 20   Ht 5\' 5"  (1.651 m)   Wt 260 lb (117.9 kg)   SpO2 97%   BMI 43.27 kg/m   Physical Exam  Constitutional: She is oriented to person, place, and time. She appears well-developed.  Cardiovascular: Normal rate, regular rhythm, normal heart sounds and intact distal pulses.  Pulmonary/Chest: Effort normal and breath sounds normal. No respiratory distress.  Musculoskeletal: She exhibits edema (bilateral leg swelling, R > L).  Neurological: She is alert and oriented to person, place, and time.  No focal deficits, no new symptoms per patient  Skin: Skin is warm and dry.  Psychiatric: She has a normal mood and affect. Her behavior is normal. Judgment and thought content normal.  Nursing note and vitals reviewed.   Imaging: No results found.  Labs:  CBC: Recent Labs    11/22/16 0636  WBC 4.8  HGB 10.3*  HCT 34.5*  PLT 245    COAGS: Recent Labs    11/22/16 0636  INR 0.94  APTT 36    BMP: Recent Labs    11/22/16 0636  NA 140  K 3.8  CL 104  CO2 28  GLUCOSE 112*  BUN 11  CALCIUM 9.0  CREATININE 1.09*  GFRNONAA 51*  GFRAA 59*    LIVER FUNCTION TESTS: No results for input(s): BILITOT, AST, ALT, ALKPHOS, PROT, ALBUMIN in the last 8760 hours.  TUMOR MARKERS: No results for input(s): AFPTM, CEA, CA199, CHROMGRNA in the last 8760 hours.  Assessment and Plan: Patient with past medical history of aneurysm presents for follow-up angiogram.  Patient presents today in their usual state of health. She does have some unilateral leg swelling which she states has been presents for weeks. She has been NPO and is not currently on blood thinners.  Risks and benefits of angiogram were discussed with the patient including, but not limited to bleeding, infection, vascular injury or contrast  induced renal failure.  This interventional procedure involves the use of X-rays and because of the nature of the planned procedure, it is possible that we will have prolonged use of X-ray fluoroscopy.  Potential radiation risks to you include (but are not limited to) the following: - A slightly elevated risk for cancer  several years later in life. This risk is typically less than 0.5% percent. This risk is low in comparison to the normal incidence of human cancer, which is 33% for women and 50% for men according to the [place]. - Radiation induced injury can include skin redness, resembling a rash, tissue breakdown / ulcers and hair loss (which can be temporary or permanent).   The likelihood of either of these occurring depends on the difficulty of the procedure and whether you are sensitive  to radiation due to previous procedures, disease, or genetic conditions.   IF your procedure requires a prolonged use of radiation, you will be notified and given written instructions for further action.  It is your responsibility to monitor the irradiated area for the 2 weeks following the procedure and to notify your physician if you are concerned that you have suffered a radiation induced injury.    All of the patient's questions were answered, patient is agreeable to proceed.  Consent signed and in chart.  Thank you for this interesting consult.  I greatly enjoyed meeting LATERA MCLIN and look forward to participating in their care.  A copy of this report was sent to the requesting provider on this date.  Electronically Signed: Docia Barrier, PA 11/22/2016, 8:15 AM   I spent a total of    15 Minutes in face to face in clinical consultation, greater than 50% of which was counseling/coordinating care for aneurysm.

## 2016-11-22 NOTE — Sedation Documentation (Signed)
Right leg swelling noted during pulse check, unable to obtain post tib due to edema.  Pt states unsure how long right leg has been swollen.  "quite some time" .  Will inform MD. Audible wheeze upon return from bathroom.  O2 sat 98% on room air.  States has taken inhalers from time to time.  None recent.

## 2016-11-22 NOTE — Sedation Documentation (Signed)
Procedure cancelled due to leg swelling, Dr Estanislado Pandy has given full explanation of reasons and follow up, IV d/c'd.

## 2016-11-22 NOTE — Discharge Instructions (Addendum)
Cerebral Angiogram, Care After °Refer to this sheet in the next few weeks. These instructions provide you with information on caring for yourself after your procedure. Your health care provider may also give you more specific instructions. Your treatment has been planned according to current medical practices, but problems sometimes occur. Call your health care provider if you have any problems or questions after your procedure. °What can I expect after the procedure? °After your procedure, it is typical to have the following: °· Bruising at the catheter insertion site that usually fades within 1-2 weeks. °· Blood collecting in the tissue (hematoma) that may be painful to the touch. It should usually decrease in size and tenderness within 1-2 weeks. °· A mild headache. ° °Follow these instructions at home: °· Take medicines only as directed by your health care provider. °· You may shower 24-48 hours after the procedure or as directed by your health care provider. Remove the bandage (dressing) and gently wash the site with plain soap and water. Pat the area dry with a clean towel. Do not rub the site, because this may cause bleeding. °· Do not take baths, swim, or use a hot tub until your health care provider approves. °· Check your insertion site every day for redness, swelling, or drainage. °· Do not apply powder or lotion to the site. °· Do not lift over 10 lb (4.5 kg) for 5 days after your procedure or as directed by your health care provider. °· Ask your health care provider when it is okay to: °? Return to work or school. °? Resume usual physical activities or sports. °? Resume sexual activity. °· Do not drive home if you are discharged the same day as the procedure. Have someone else drive you. °· You may drive 24 hours after the procedure unless otherwise instructed by your health care provider. °· Do not operate machinery or power tools for 24 hours after the procedure or as directed by your health care  provider. °· If your procedure was done as an outpatient procedure, which means that you went home the same day as your procedure, a responsible adult should be with you for the first 24 hours after you arrive home. °· Keep all follow-up visits as directed by your health care provider. This is important. °Contact a health care provider if: °· You have a fever. °· You have chills. °· You have increased bleeding from the catheter insertion site. Hold pressure on the site. °Get help right away if: °· You have vision changes or loss of vision. °· You have numbness or weakness on one side of your body. °· You have difficulty talking, or you have slurred speech or cannot speak (aphasia). °· You feel confused or have difficulty remembering. °· You have unusual pain at the catheter insertion site. °· You have redness, warmth, or swelling at the catheter insertion site. °· You have drainage (other than a small amount of blood on the dressing) from the catheter insertion site. °· The catheter insertion site is bleeding, and the bleeding does not stop after 30 minutes of holding steady pressure on the site. °These symptoms may represent a serious problem that is an emergency. Do not wait to see if the symptoms will go away. Get medical help right away. Call your local emergency services (911 in [place] Do not drive yourself to the hospital. °This information is not intended to replace advice given to you by your health care provider. Make sure you discuss any questions   you have with your health care provider. °Document Released: 05/06/2013 Document Revised: 05/28/2015 Document Reviewed: 01/02/2013 °Elsevier Interactive Patient Education © 2017 Elsevier Inc. °Moderate Conscious Sedation, Adult, Care After °These instructions provide you with information about caring for yourself after your procedure. Your health care provider may also give you more specific instructions. Your treatment has been planned according to current  medical practices, but problems sometimes occur. Call your health care provider if you have any problems or questions after your procedure. °What can I expect after the procedure? °After your procedure, it is common: °· To feel sleepy for several hours. °· To feel clumsy and have poor balance for several hours. °· To have poor judgment for several hours. °· To vomit if you eat too soon. ° °Follow these instructions at home: °For at least 24 hours after the procedure: ° °· Do not: °? Participate in activities where you could fall or become injured. °? Drive. °? Use heavy machinery. °? Drink alcohol. °? Take sleeping pills or medicines that cause drowsiness. °? Make important decisions or sign legal documents. °? Take care of children on your own. °· Rest. °Eating and drinking °· Follow the diet recommended by your health care provider. °· If you vomit: °? Drink water, juice, or soup when you can drink without vomiting. °? Make sure you have little or no nausea before eating solid foods. °General instructions °· Have a responsible adult stay with you until you are awake and alert. °· Take over-the-counter and prescription medicines only as told by your health care provider. °· If you smoke, do not smoke without supervision. °· Keep all follow-up visits as told by your health care provider. This is important. °Contact a health care provider if: °· You keep feeling nauseous or you keep vomiting. °· You feel light-headed. °· You develop a rash. °· You have a fever. °Get help right away if: °· You have trouble breathing. °This information is not intended to replace advice given to you by your health care provider. Make sure you discuss any questions you have with your health care provider. °Document Released: 10/10/2012 Document Revised: 05/25/2015 Document Reviewed: 04/11/2015 °Elsevier Interactive Patient Education © 2018 Elsevier Inc. ° °

## 2016-11-22 NOTE — Sedation Documentation (Signed)
Escorted via wheelchair. Dr Woody Seller' office called to update and have them call pt to make appointment for leg swelling.

## 2016-12-01 ENCOUNTER — Telehealth (HOSPITAL_COMMUNITY): Payer: Self-pay

## 2016-12-01 DIAGNOSIS — Z79899 Other long term (current) drug therapy: Secondary | ICD-10-CM | POA: Diagnosis not present

## 2016-12-01 DIAGNOSIS — I10 Essential (primary) hypertension: Secondary | ICD-10-CM | POA: Diagnosis not present

## 2016-12-01 DIAGNOSIS — M7989 Other specified soft tissue disorders: Secondary | ICD-10-CM | POA: Diagnosis not present

## 2016-12-01 DIAGNOSIS — Z713 Dietary counseling and surveillance: Secondary | ICD-10-CM | POA: Diagnosis not present

## 2016-12-01 DIAGNOSIS — I2699 Other pulmonary embolism without acute cor pulmonale: Secondary | ICD-10-CM | POA: Diagnosis not present

## 2016-12-01 DIAGNOSIS — F909 Attention-deficit hyperactivity disorder, unspecified type: Secondary | ICD-10-CM | POA: Diagnosis not present

## 2016-12-01 DIAGNOSIS — F329 Major depressive disorder, single episode, unspecified: Secondary | ICD-10-CM | POA: Diagnosis not present

## 2016-12-01 DIAGNOSIS — Z299 Encounter for prophylactic measures, unspecified: Secondary | ICD-10-CM | POA: Diagnosis not present

## 2016-12-01 NOTE — Telephone Encounter (Signed)
Called to reschedule angiogram, left message for pt to return call. AW 

## 2016-12-08 DIAGNOSIS — I10 Essential (primary) hypertension: Secondary | ICD-10-CM | POA: Diagnosis not present

## 2016-12-08 DIAGNOSIS — I639 Cerebral infarction, unspecified: Secondary | ICD-10-CM | POA: Diagnosis not present

## 2016-12-12 ENCOUNTER — Other Ambulatory Visit: Payer: Self-pay | Admitting: Radiology

## 2016-12-13 ENCOUNTER — Telehealth (HOSPITAL_COMMUNITY): Payer: Self-pay | Admitting: *Deleted

## 2016-12-13 ENCOUNTER — Telehealth (HOSPITAL_COMMUNITY): Payer: Self-pay

## 2016-12-13 NOTE — Telephone Encounter (Signed)
Returned pt's phone call, left message for pt to call back. AW

## 2016-12-13 NOTE — Telephone Encounter (Signed)
Marcie Bal Network engineer from [facility] called to say that Dr Woody Seller requests that we reschedule Mrs Lux's angiogram because previous swelling has resolved. Gave information to [place] and she states that she will call Mrs Burandt to reschedule angiogram with Dr Estanislado Pandy.

## 2016-12-15 ENCOUNTER — Other Ambulatory Visit: Payer: Self-pay | Admitting: General Surgery

## 2016-12-16 ENCOUNTER — Other Ambulatory Visit (HOSPITAL_COMMUNITY): Payer: Self-pay | Admitting: Interventional Radiology

## 2016-12-16 ENCOUNTER — Ambulatory Visit (HOSPITAL_COMMUNITY)
Admission: RE | Admit: 2016-12-16 | Discharge: 2016-12-16 | Disposition: A | Discharge status: Home / Self Care | Payer: Medicare Other | Source: Ambulatory Visit | Attending: Interventional Radiology | Admitting: Interventional Radiology

## 2016-12-16 ENCOUNTER — Encounter (HOSPITAL_COMMUNITY): Payer: Self-pay

## 2016-12-16 DIAGNOSIS — I671 Cerebral aneurysm, nonruptured: Secondary | ICD-10-CM | POA: Insufficient documentation

## 2016-12-16 DIAGNOSIS — Z79899 Other long term (current) drug therapy: Secondary | ICD-10-CM | POA: Diagnosis not present

## 2016-12-16 DIAGNOSIS — Z982 Presence of cerebrospinal fluid drainage device: Secondary | ICD-10-CM | POA: Diagnosis not present

## 2016-12-16 DIAGNOSIS — Z880 Allergy status to penicillin: Secondary | ICD-10-CM | POA: Insufficient documentation

## 2016-12-16 DIAGNOSIS — G40909 Epilepsy, unspecified, not intractable, without status epilepticus: Secondary | ICD-10-CM | POA: Diagnosis not present

## 2016-12-16 DIAGNOSIS — I729 Aneurysm of unspecified site: Secondary | ICD-10-CM

## 2016-12-16 DIAGNOSIS — Z8679 Personal history of other diseases of the circulatory system: Secondary | ICD-10-CM | POA: Diagnosis not present

## 2016-12-16 DIAGNOSIS — Z86711 Personal history of pulmonary embolism: Secondary | ICD-10-CM | POA: Insufficient documentation

## 2016-12-16 DIAGNOSIS — Z8673 Personal history of transient ischemic attack (TIA), and cerebral infarction without residual deficits: Secondary | ICD-10-CM | POA: Diagnosis not present

## 2016-12-16 DIAGNOSIS — E119 Type 2 diabetes mellitus without complications: Secondary | ICD-10-CM | POA: Insufficient documentation

## 2016-12-16 HISTORY — PX: IR ANGIO VERTEBRAL SEL SUBCLAVIAN INNOMINATE BILAT MOD SED: IMG5366

## 2016-12-16 HISTORY — PX: IR ANGIO INTRA EXTRACRAN SEL COM CAROTID INNOMINATE BILAT MOD SED: IMG5360

## 2016-12-16 LAB — BASIC METABOLIC PANEL
ANION GAP: 10 (ref 5–15)
BUN: 17 mg/dL (ref 6–20)
CALCIUM: 9.3 mg/dL (ref 8.9–10.3)
CO2: 30 mmol/L (ref 22–32)
Chloride: 101 mmol/L (ref 101–111)
Creatinine, Ser: 1.35 mg/dL — ABNORMAL HIGH (ref 0.44–1.00)
GFR calc non Af Amer: 39 mL/min — ABNORMAL LOW (ref >60)
GFR, EST AFRICAN AMERICAN: 46 mL/min — AB (ref >60)
Glucose, Bld: 122 mg/dL — ABNORMAL HIGH (ref 65–99)
POTASSIUM: 3.5 mmol/L (ref 3.5–5.1)
Sodium: 141 mmol/L (ref 135–145)

## 2016-12-16 LAB — PROTIME-INR
INR: 1.05
PROTHROMBIN TIME: 13.6 s (ref 11.4–15.2)

## 2016-12-16 LAB — CBC
HEMATOCRIT: 36.2 % (ref 36.0–46.0)
HEMOGLOBIN: 11 g/dL — AB (ref 12.0–15.0)
MCH: 25.5 pg — AB (ref 26.0–34.0)
MCHC: 30.4 g/dL (ref 30.0–36.0)
MCV: 84 fL (ref 78.0–100.0)
Platelets: 254 10*3/uL (ref 150–400)
RBC: 4.31 MIL/uL (ref 3.87–5.11)
RDW: 16.9 % — ABNORMAL HIGH (ref 11.5–15.5)
WBC: 5.8 10*3/uL (ref 4.0–10.5)

## 2016-12-16 LAB — APTT: aPTT: 35 seconds (ref 24–36)

## 2016-12-16 MED ORDER — HEPARIN SODIUM (PORCINE) 1000 UNIT/ML IJ SOLN
INTRAMUSCULAR | Status: AC
  Filled 2016-12-16: qty 1

## 2016-12-16 MED ORDER — IOPAMIDOL (ISOVUE-300) INJECTION 61%
INTRAVENOUS | Status: AC
  Filled 2016-12-16: qty 150

## 2016-12-16 MED ORDER — LIDOCAINE HCL 1 % IJ SOLN
INTRAMUSCULAR | Status: DC | PRN
  Administered 2016-12-16: 10 mL

## 2016-12-16 MED ORDER — FENTANYL CITRATE (PF) 100 MCG/2ML IJ SOLN
INTRAMUSCULAR | Status: DC | PRN
  Administered 2016-12-16 (×2): 25 ug via INTRAVENOUS

## 2016-12-16 MED ORDER — MIDAZOLAM HCL 2 MG/2ML IJ SOLN
INTRAMUSCULAR | Status: DC | PRN
  Administered 2016-12-16: 1 mg via INTRAVENOUS

## 2016-12-16 MED ORDER — HEPARIN SODIUM (PORCINE) 1000 UNIT/ML IJ SOLN
INTRAMUSCULAR | Status: DC | PRN
  Administered 2016-12-16: 1000 [IU] via INTRAVENOUS

## 2016-12-16 MED ORDER — MIDAZOLAM HCL 2 MG/2ML IJ SOLN
INTRAMUSCULAR | Status: AC
  Filled 2016-12-16: qty 2

## 2016-12-16 MED ORDER — FENTANYL CITRATE (PF) 100 MCG/2ML IJ SOLN
INTRAMUSCULAR | Status: AC
Start: 2016-12-16 — End: 2016-12-16
  Filled 2016-12-16: qty 2

## 2016-12-16 MED ORDER — SODIUM CHLORIDE 0.9 % IV SOLN: INTRAVENOUS | Status: DC

## 2016-12-16 MED ORDER — SODIUM CHLORIDE 0.9 % IV SOLN
Freq: Once | INTRAVENOUS | Status: AC
  Administered 2016-12-16: 07:00:00 via INTRAVENOUS

## 2016-12-16 MED ORDER — LIDOCAINE HCL 1 % IJ SOLN
INTRAMUSCULAR | Status: AC
  Filled 2016-12-16: qty 20

## 2016-12-16 MED ORDER — IOPAMIDOL (ISOVUE-300) INJECTION 61%
INTRAVENOUS | Status: AC
  Administered 2016-12-16: 100 mL via INTRA_ARTERIAL
  Filled 2016-12-16: qty 50

## 2016-12-16 NOTE — Sedation Documentation (Signed)
Pt vitals stable. Pt denies pain, able to follow commands

## 2016-12-16 NOTE — Sedation Documentation (Signed)
Patient is resting comfortably. 

## 2016-12-16 NOTE — Procedures (Signed)
S/P 4 vessel cerebral arteriogram RT CFA approach. Findings. 1.Approx 2.2.4 mm x 2.6 mm neck remnant of coiled ACOM aneurysm.

## 2016-12-16 NOTE — Discharge Instructions (Addendum)
Cerebral Angiogram °Cerebral angiogram, also called cerebral angiography, is an imaging test. It uses X-ray or digital images and colored dye (iodine solution) that is called contrast. An angiogram is done to look at blood vessels in the brain and neck. It helps to detect any abnormalities in the vessels that might affect blood flow. °During the procedure, the physician will inject medicine into an area in your groin or arm to numb it. The physician will make a tiny incision in the groin or arm. A thin tube (catheter) will be slipped into an artery and moved to the brain or neck. The contrast will then be injected into the catheter and images will be taken of the area. °Tell a health care provider about: °· Any allergies you have, particularly shellfish. °· All medicines you are taking, including vitamins, herbs, eye drops, creams, and over-the-counter medicines. °· Any problems you or family members have had with anesthetic medicines. °· Any reactions you have had to contrast dye or iodine. °· Any blood disorders you have. °· Any surgeries you have had. °· Medical conditions you have, including pregnancy or the possibility of pregnancy. °· If you are currently breastfeeding. °What are the risks? °Generally, this is a safe procedure. However, problems can occur and include: °· Allergic reaction to contrast material or anesthesia. °· Damage to surrounding nerves, tissues, or structures. °· Bleeding or bruising. °· Blood clot. °· Infection. °· Inability to remember what happened (amnesia) (usually temporary). °· Weakness, numbness, speech, or vision problems (usually temporary). °· Stroke. °· Kidney injury. ° °What happens before the procedure? °· You may have: °? A physical exam. °? Blood and urine tests. °? Imaging tests, including X-rays or MRIs. °· Ask your health care provider about: °? Changing or stopping your regular medicines. This is especially important if you are taking diabetes medicines or blood  thinners. °? Taking medicines such as aspirin or ibuprofen. These medicines can thin your blood. Do not take these medicines before your procedure if your health care provider asks you not to. °· Do not eat or drink anything after midnight on the night before the procedure or as directed by your health care provider. °What happens during the procedure? °· You will lie on your back on an imaging bed with a C-shaped machine around you. °· Your head will be secured to the bed with a strap or device to help you keep still. °· You will be given medicine to help you relax (sedative) through an IV in your arm. °· Your heart rate and other vital signals will be watched carefully. °· Cleaning and numbing medicine (local anesthetic) will be applied to your skin where the insertion will take place. This is usually your groin, leg, or arm. °· The radiologist will make a small incision. °· The catheter will be inserted into an artery that leads to the head. You may feel slight pressure. °· The catheter will be moved through the body all the way up to your neck and brain. Images will help your health care provider bring the catheter to the correct location. °· The dye will be injected into the catheter and will travel to your head or neck area. You may feel a warming or burning sensation or notice a strange taste as the dye goes through your system. °· Images will be taken of how the dye flows through the area. It is normal to hear beeping noises and machines during the procedure. °· While the images are being taken, you   may be given instructions on breathing, swallowing, moving, or talking. °· When the images are finished, the catheter will be slowly removed. °· Pressure will be applied to the skin to stop any bleeding. A tight bandage (dressing) or seal will be applied to the skin. °· Your IV line will be removed. °What happens after the procedure? °· You will stay in a recovery area until the medicine has worn off. Your blood  pressure and pulse will be observed. °· You will be asked to lie flat and to keep your arm or leg straight in the recovery room. °· The insertion site will be watched for bleeding and you will be checked often. °· You may feel tired. °· You may feel tenderness or notice bruising at the insertion site. °This information is not intended to replace advice given to you by your health care provider. Make sure you discuss any questions you have with your health care provider. °Document Released: 05/06/2013 Document Revised: 05/28/2015 Document Reviewed: 01/02/2013 °Elsevier Interactive Patient Education © 2017 [employer] °Moderate Conscious Sedation, Adult, Care After °These instructions provide you with information about caring for yourself after your procedure. Your health care provider may also give you more specific instructions. Your treatment has been planned according to current medical practices, but problems sometimes occur. Call your health care provider if you have any problems or questions after your procedure. °What can I expect after the procedure? °After your procedure, it is common: °· To feel sleepy for several hours. °· To feel clumsy and have poor balance for several hours. °· To have poor judgment for several hours. °· To vomit if you eat too soon. ° °Follow these instructions at home: °For at least 24 hours after the procedure: ° °· Do not: °? Participate in activities where you could fall or become injured. °? Drive. °? Use heavy machinery. °? Drink alcohol. °? Take sleeping pills or medicines that cause drowsiness. °? Make important decisions or sign legal documents. °? Take care of children on your own. °· Rest. °Eating and drinking °· Follow the diet recommended by your health care provider. °· If you vomit: °? Drink water, juice, or soup when you can drink without vomiting. °? Make sure you have little or no nausea before eating solid foods. °General instructions °· Have a responsible adult  stay with you until you are awake and alert. °· Take over-the-counter and prescription medicines only as told by your health care provider. °· If you smoke, do not smoke without supervision. °· Keep all follow-up visits as told by your health care provider. This is important. °Contact a health care provider if: °· You keep feeling nauseous or you keep vomiting. °· You feel light-headed. °· You develop a rash. °· You have a fever. °Get help right away if: °· You have trouble breathing. °This information is not intended to replace advice given to you by your health care provider. Make sure you discuss any questions you have with your health care provider. °Document Released: 10/10/2012 Document Revised: 05/25/2015 Document Reviewed: 04/11/2015 °Elsevier Interactive Patient Education © 2018 Else[employer] Inc. ° °

## 2016-12-16 NOTE — Sedation Documentation (Signed)
Vital signs stable. 

## 2016-12-16 NOTE — Sedation Documentation (Signed)
Pt is resting with no complaints at this time. Procedure started

## 2016-12-16 NOTE — H&P (Signed)
Chief Complaint: Patient was seen in consultation today for cerebral arteriogram at the request of Dr Francesco Runner  Supervising Physician: Luanne Bras  Patient Status: [facility] - Out-pt  History of Present Illness: [name] is a 66 y.o. female   Known to [facility] Hx [place] Anterior communicating artery aneurysm embolization 2013 Followed by Dr Estanislado Pandy Was scheduled for follow up arteriogram 11/22/16----procedure cancelled secondary right leg swelling She has been evaluated by Dr April Manson of medications has resolved lower extremety edema. Now rescheduled for cerebral arteriogram  CTA 1 yr ago: IMPRESSION: Stable 2 mm neck of the endovascular treated anterior communicating artery aneurysm. Stable mild intracranial and extracranial atherosclerotic change, without flow limiting stenosis.  Pt denies any neuro symptoms Denies N/V; speech or vision changes No headaches or gait changes  Past Medical History:  Diagnosis Date  . Attention deficit disorder of childhood with hyperactivity   . Depression   . Esophageal reflux   . Essential hypertension, benign   . PE (pulmonary embolism)    Status post IVC placement  . Recurrent UTI   . SAH (subarachnoid hemorrhage) ([place]) 08/16/11   Coiling of ACA aneurysm, VP shunt  . Seizure disorder ([place])   . Stroke ([place])   . Type 2 diabetes mellitus ([place])     Past Surgical History:  Procedure Laterality Date  . ABDOMINAL HYSTERECTOMY    . ANEURYSM COILING  08/16/11  . VENTRICULOPERITONEAL SHUNT  09/02/2011   Procedure: SHUNT INSERTION VENTRICULAR-PERITONEAL;  Surgeon: Erline Levine, MD;  Location: [place] [facility];  Service: Neurosurgery;  Laterality: Right;    Allergies: Penicillins  Medications: Prior to Admission medications   Medication Sig Start Date End Date Taking? Authorizing Provider  allopurinol (ZYLOPRIM) 300 MG tablet Take 150 mg by mouth daily.    Yes [provider]  amLODipine (NORVASC) 10 MG tablet Take 1  tablet (10 mg total) by mouth daily. 02/12/12  Yes Dhungel, Nishant, MD  citalopram (CELEXA) 20 MG tablet Take 10 mg by mouth daily.    Yes [provider]  ferrous sulfate 325 (65 FE) MG tablet Take 325 mg daily with breakfast by mouth.   Yes [provider]  furosemide (LASIX) 40 MG tablet Take 40 mg by mouth daily.   Yes [provider]  hydrALAZINE (APRESOLINE) 25 MG tablet Take 25 mg by mouth 2 (two) times daily. Noon & bedtime   Yes [provider]  labetalol (NORMODYNE) 200 MG tablet Take 400 mg by mouth 2 (two) times daily.    Yes [provider]  levETIRAcetam (KEPPRA) 500 MG tablet Take 1 tablet (500 mg total) by mouth 2 (two) times daily. 02/23/12  Yes Meredith Staggers, MD  pantoprazole (PROTONIX) 40 MG tablet Take 40 mg by mouth daily.   Yes [provider]  potassium chloride SA (K-DUR,KLOR-CON) 20 MEQ tablet Take 20 mEq by mouth daily.    Yes [provider]     Family History  Problem Relation Age of Onset  . Hypertension Unknown     Social History   Socioeconomic History  . Marital status: Married    Spouse name: None  . Number of children: None  . Years of education: None  . Highest education level: None  Social Needs  . Financial resource strain: None  . Food insecurity - worry: None  . Food insecurity - inability: None  . Transportation needs - medical: None  . Transportation needs - non-medical: None  Occupational History  . Occupation:  Disability    Comment: Retired from [employer] and [employer]  . Smoking status: Never Smoker  . Smokeless tobacco: Never Used  Substance and Sexual Activity  . Alcohol use: No  . Drug use: No  . Sexual activity: None  Other Topics Concern  . None  Social History Narrative  . None    Review of Systems: A 12 point ROS discussed and pertinent positives are indicated in the HPI above.  All other systems are negative.  Review of Systems  Constitutional:  Negative for activity change, appetite change, fatigue and fever.  HENT: Negative for tinnitus and trouble swallowing.   Eyes: Negative for visual disturbance.  Respiratory: Negative for cough and shortness of breath.   Cardiovascular: Negative for chest pain.  Gastrointestinal: Negative for abdominal pain and nausea.  Musculoskeletal: Negative for back pain and gait problem.  Neurological: Negative for dizziness, tremors, seizures, syncope, facial asymmetry, speech difficulty, weakness, light-headedness, numbness and headaches.  Psychiatric/Behavioral: Negative for behavioral problems and confusion.    Vital Signs: BP (!) 156/79   Pulse 71   Temp 98 F (36.7 C) (Oral)   Resp 18   Ht 5\' 5"  (1.651 m)   Wt 260 lb (117.9 kg)   SpO2 96%   BMI 43.27 kg/m   Physical Exam  Constitutional: She is oriented to person, place, and time. She appears well-nourished.  HENT:  Head: Atraumatic.  Eyes: EOM are normal.  Neck: Neck supple.  Cardiovascular: Normal rate, regular rhythm and normal heart sounds.  No murmur heard. Pulmonary/Chest: Effort normal and breath sounds normal. She has no wheezes.  Abdominal: Soft. Bowel sounds are normal. There is no tenderness.  Musculoskeletal: Normal range of motion. She exhibits no edema.  Neurological: She is alert and oriented to person, place, and time. No cranial nerve deficit.  Skin: Skin is warm and dry. No erythema.  Psychiatric: She has a normal mood and affect. Her behavior is normal. Judgment and thought content normal.  Nursing note and vitals reviewed.   Imaging: No results found.  Labs:  CBC: Recent Labs    11/22/16 0636  WBC 4.8  HGB 10.3*  HCT 34.5*  PLT 245    COAGS: Recent Labs    11/22/16 0636  INR 0.94  APTT 36    BMP: Recent Labs    11/22/16 0636  NA 140  K 3.8  CL 104  CO2 28  GLUCOSE 112*  BUN 11  CALCIUM 9.0  CREATININE 1.09*  GFRNONAA 51*  GFRAA 59*    LIVER FUNCTION TESTS: No results for  input(s): BILITOT, AST, ALT, ALKPHOS, PROT, ALBUMIN in the last 8760 hours.  TUMOR MARKERS: No results for input(s): AFPTM, CEA, CA199, CHROMGRNA in the last 8760 hours.  Assessment and Plan:  SAH/ACOM aneurysm embolization 2013 Scheduled for routine follow up arteriogram Risks and benefits of cerebral arteriogram were discussed with the patient including, but not limited to bleeding, infection, vascular injury, contrast induced renal failure, stroke or even death. This interventional procedure involves the use of X-rays and because of the nature of the planned procedure, it is possible that we will have prolonged use of X-ray fluoroscopy. Potential radiation risks to you include (but are not limited to) the following: - A slightly elevated risk for cancer  several years later in life. This risk is typically less than 0.5% percent. This risk is low in comparison to the normal incidence of human cancer, which is 33% for women and 50% for men  according to the [employer]. - Radiation induced injury can include skin redness, resembling a rash, tissue breakdown / ulcers and hair loss (which can be temporary or permanent).  The likelihood of either of these occurring depends on the difficulty of the procedure and whether you are sensitive to radiation due to previous procedures, disease, or genetic conditions.  IF your procedure requires a prolonged use of radiation, you will be notified and given written instructions for further action.  It is your responsibility to monitor the irradiated area for the 2 weeks following the procedure and to notify your physician if you are concerned that you have suffered a radiation induced injury.    All of the patient's questions were answered, patient is agreeable to proceed. Consent signed and in chart.  Thank you for this interesting consult.  I greatly enjoyed meeting [name] and look forward to participating in their care.  A copy of  this report was sent to the requesting provider on this date.  Electronically Signed: Lavonia Drafts, PA-C 12/16/2016, 7:15 AM   I spent a total of  30 Minutes   in face to face in clinical consultation, greater than 50% of which was counseling/coordinating care for cerebral arteriogram

## 2016-12-16 NOTE — Sedation Documentation (Signed)
Patient is resting comfortably. Vitals stable. 

## 2016-12-19 ENCOUNTER — Encounter (HOSPITAL_COMMUNITY): Payer: Self-pay | Admitting: Interventional Radiology

## 2016-12-22 DIAGNOSIS — Z6841 Body Mass Index (BMI) 40.0 and over, adult: Secondary | ICD-10-CM | POA: Diagnosis not present

## 2016-12-22 DIAGNOSIS — F329 Major depressive disorder, single episode, unspecified: Secondary | ICD-10-CM | POA: Diagnosis not present

## 2016-12-22 DIAGNOSIS — Z79899 Other long term (current) drug therapy: Secondary | ICD-10-CM | POA: Diagnosis not present

## 2016-12-22 DIAGNOSIS — Z299 Encounter for prophylactic measures, unspecified: Secondary | ICD-10-CM | POA: Diagnosis not present

## 2016-12-22 DIAGNOSIS — I2699 Other pulmonary embolism without acute cor pulmonale: Secondary | ICD-10-CM | POA: Diagnosis not present

## 2016-12-22 DIAGNOSIS — I10 Essential (primary) hypertension: Secondary | ICD-10-CM | POA: Diagnosis not present

## 2017-02-02 DIAGNOSIS — Z6841 Body Mass Index (BMI) 40.0 and over, adult: Secondary | ICD-10-CM | POA: Diagnosis not present

## 2017-02-02 DIAGNOSIS — R600 Localized edema: Secondary | ICD-10-CM | POA: Diagnosis not present

## 2017-02-02 DIAGNOSIS — Z789 Other specified health status: Secondary | ICD-10-CM | POA: Diagnosis not present

## 2017-02-02 DIAGNOSIS — I2699 Other pulmonary embolism without acute cor pulmonale: Secondary | ICD-10-CM | POA: Diagnosis not present

## 2017-02-02 DIAGNOSIS — G40909 Epilepsy, unspecified, not intractable, without status epilepticus: Secondary | ICD-10-CM | POA: Diagnosis not present

## 2017-02-02 DIAGNOSIS — Z299 Encounter for prophylactic measures, unspecified: Secondary | ICD-10-CM | POA: Diagnosis not present

## 2017-02-02 DIAGNOSIS — I10 Essential (primary) hypertension: Secondary | ICD-10-CM | POA: Diagnosis not present

## 2017-02-02 DIAGNOSIS — F329 Major depressive disorder, single episode, unspecified: Secondary | ICD-10-CM | POA: Diagnosis not present

## 2017-02-17 DIAGNOSIS — I10 Essential (primary) hypertension: Secondary | ICD-10-CM | POA: Diagnosis not present

## 2017-02-17 DIAGNOSIS — I639 Cerebral infarction, unspecified: Secondary | ICD-10-CM | POA: Diagnosis not present

## 2017-02-23 DIAGNOSIS — H2513 Age-related nuclear cataract, bilateral: Secondary | ICD-10-CM | POA: Diagnosis not present

## 2017-02-23 DIAGNOSIS — H5203 Hypermetropia, bilateral: Secondary | ICD-10-CM | POA: Diagnosis not present

## 2017-02-23 DIAGNOSIS — I10 Essential (primary) hypertension: Secondary | ICD-10-CM | POA: Diagnosis not present

## 2017-02-23 DIAGNOSIS — H35033 Hypertensive retinopathy, bilateral: Secondary | ICD-10-CM | POA: Diagnosis not present

## 2017-04-11 DIAGNOSIS — Z6841 Body Mass Index (BMI) 40.0 and over, adult: Secondary | ICD-10-CM | POA: Diagnosis not present

## 2017-04-11 DIAGNOSIS — I2699 Other pulmonary embolism without acute cor pulmonale: Secondary | ICD-10-CM | POA: Diagnosis not present

## 2017-04-11 DIAGNOSIS — I10 Essential (primary) hypertension: Secondary | ICD-10-CM | POA: Diagnosis not present

## 2017-04-11 DIAGNOSIS — Z299 Encounter for prophylactic measures, unspecified: Secondary | ICD-10-CM | POA: Diagnosis not present

## 2017-04-11 DIAGNOSIS — G40909 Epilepsy, unspecified, not intractable, without status epilepticus: Secondary | ICD-10-CM | POA: Diagnosis not present

## 2017-06-15 DIAGNOSIS — I639 Cerebral infarction, unspecified: Secondary | ICD-10-CM | POA: Diagnosis not present

## 2017-06-15 DIAGNOSIS — I10 Essential (primary) hypertension: Secondary | ICD-10-CM | POA: Diagnosis not present

## 2017-07-27 DIAGNOSIS — G4733 Obstructive sleep apnea (adult) (pediatric): Secondary | ICD-10-CM | POA: Diagnosis not present

## 2017-07-27 DIAGNOSIS — R5383 Other fatigue: Secondary | ICD-10-CM | POA: Diagnosis not present

## 2017-07-27 DIAGNOSIS — I6990 Unspecified sequelae of unspecified cerebrovascular disease: Secondary | ICD-10-CM | POA: Diagnosis not present

## 2017-07-27 DIAGNOSIS — I6529 Occlusion and stenosis of unspecified carotid artery: Secondary | ICD-10-CM | POA: Diagnosis not present

## 2017-07-28 DIAGNOSIS — Z1231 Encounter for screening mammogram for malignant neoplasm of breast: Secondary | ICD-10-CM | POA: Diagnosis not present

## 2017-08-15 DIAGNOSIS — Z1211 Encounter for screening for malignant neoplasm of colon: Secondary | ICD-10-CM | POA: Diagnosis not present

## 2017-08-15 DIAGNOSIS — R5383 Other fatigue: Secondary | ICD-10-CM | POA: Diagnosis not present

## 2017-08-15 DIAGNOSIS — Z0000 Encounter for general adult medical examination without abnormal findings: Secondary | ICD-10-CM | POA: Diagnosis not present

## 2017-08-15 DIAGNOSIS — I2699 Other pulmonary embolism without acute cor pulmonale: Secondary | ICD-10-CM | POA: Diagnosis not present

## 2017-08-15 DIAGNOSIS — Z6841 Body Mass Index (BMI) 40.0 and over, adult: Secondary | ICD-10-CM | POA: Diagnosis not present

## 2017-08-15 DIAGNOSIS — G40909 Epilepsy, unspecified, not intractable, without status epilepticus: Secondary | ICD-10-CM | POA: Diagnosis not present

## 2017-08-15 DIAGNOSIS — Z299 Encounter for prophylactic measures, unspecified: Secondary | ICD-10-CM | POA: Diagnosis not present

## 2017-08-15 DIAGNOSIS — I10 Essential (primary) hypertension: Secondary | ICD-10-CM | POA: Diagnosis not present

## 2017-08-15 DIAGNOSIS — Z7189 Other specified counseling: Secondary | ICD-10-CM | POA: Diagnosis not present

## 2017-08-15 DIAGNOSIS — Z1331 Encounter for screening for depression: Secondary | ICD-10-CM | POA: Diagnosis not present

## 2017-08-15 DIAGNOSIS — F909 Attention-deficit hyperactivity disorder, unspecified type: Secondary | ICD-10-CM | POA: Diagnosis not present

## 2017-08-15 DIAGNOSIS — Z79899 Other long term (current) drug therapy: Secondary | ICD-10-CM | POA: Diagnosis not present

## 2017-08-15 DIAGNOSIS — Z1339 Encounter for screening examination for other mental health and behavioral disorders: Secondary | ICD-10-CM | POA: Diagnosis not present

## 2017-08-23 DIAGNOSIS — I10 Essential (primary) hypertension: Secondary | ICD-10-CM | POA: Diagnosis not present

## 2017-08-23 DIAGNOSIS — I639 Cerebral infarction, unspecified: Secondary | ICD-10-CM | POA: Diagnosis not present

## 2017-09-22 DIAGNOSIS — I10 Essential (primary) hypertension: Secondary | ICD-10-CM | POA: Diagnosis not present

## 2017-09-22 DIAGNOSIS — I639 Cerebral infarction, unspecified: Secondary | ICD-10-CM | POA: Diagnosis not present

## 2017-10-09 DIAGNOSIS — Z23 Encounter for immunization: Secondary | ICD-10-CM | POA: Diagnosis not present

## 2017-11-07 DIAGNOSIS — I10 Essential (primary) hypertension: Secondary | ICD-10-CM | POA: Diagnosis not present

## 2017-11-07 DIAGNOSIS — I639 Cerebral infarction, unspecified: Secondary | ICD-10-CM | POA: Diagnosis not present

## 2017-11-14 DIAGNOSIS — I2699 Other pulmonary embolism without acute cor pulmonale: Secondary | ICD-10-CM | POA: Diagnosis not present

## 2017-11-14 DIAGNOSIS — Z6841 Body Mass Index (BMI) 40.0 and over, adult: Secondary | ICD-10-CM | POA: Diagnosis not present

## 2017-11-14 DIAGNOSIS — I10 Essential (primary) hypertension: Secondary | ICD-10-CM | POA: Diagnosis not present

## 2017-11-14 DIAGNOSIS — Z299 Encounter for prophylactic measures, unspecified: Secondary | ICD-10-CM | POA: Diagnosis not present

## 2017-11-14 DIAGNOSIS — G40909 Epilepsy, unspecified, not intractable, without status epilepticus: Secondary | ICD-10-CM | POA: Diagnosis not present

## 2017-11-29 ENCOUNTER — Telehealth (HOSPITAL_COMMUNITY): Payer: Self-pay

## 2017-11-29 NOTE — Telephone Encounter (Signed)
Called to see if pt wanted to schedule f/u this year or next since f/u is due in 1-2 years. No answer, left vm. AW

## 2017-12-14 DIAGNOSIS — E2839 Other primary ovarian failure: Secondary | ICD-10-CM | POA: Diagnosis not present

## 2017-12-14 DIAGNOSIS — Z299 Encounter for prophylactic measures, unspecified: Secondary | ICD-10-CM | POA: Diagnosis not present

## 2017-12-14 DIAGNOSIS — G40909 Epilepsy, unspecified, not intractable, without status epilepticus: Secondary | ICD-10-CM | POA: Diagnosis not present

## 2017-12-14 DIAGNOSIS — I10 Essential (primary) hypertension: Secondary | ICD-10-CM | POA: Diagnosis not present

## 2017-12-14 DIAGNOSIS — Z6841 Body Mass Index (BMI) 40.0 and over, adult: Secondary | ICD-10-CM | POA: Diagnosis not present

## 2018-01-10 DIAGNOSIS — I10 Essential (primary) hypertension: Secondary | ICD-10-CM | POA: Diagnosis not present

## 2018-01-10 DIAGNOSIS — I639 Cerebral infarction, unspecified: Secondary | ICD-10-CM | POA: Diagnosis not present

## 2018-02-13 DIAGNOSIS — I639 Cerebral infarction, unspecified: Secondary | ICD-10-CM | POA: Diagnosis not present

## 2018-02-13 DIAGNOSIS — I10 Essential (primary) hypertension: Secondary | ICD-10-CM | POA: Diagnosis not present

## 2018-02-28 DIAGNOSIS — H2513 Age-related nuclear cataract, bilateral: Secondary | ICD-10-CM | POA: Diagnosis not present

## 2018-02-28 DIAGNOSIS — H35033 Hypertensive retinopathy, bilateral: Secondary | ICD-10-CM | POA: Diagnosis not present

## 2018-02-28 DIAGNOSIS — I10 Essential (primary) hypertension: Secondary | ICD-10-CM | POA: Diagnosis not present

## 2018-02-28 DIAGNOSIS — H5203 Hypermetropia, bilateral: Secondary | ICD-10-CM | POA: Diagnosis not present

## 2018-03-15 DIAGNOSIS — Z299 Encounter for prophylactic measures, unspecified: Secondary | ICD-10-CM | POA: Diagnosis not present

## 2018-03-15 DIAGNOSIS — Z6841 Body Mass Index (BMI) 40.0 and over, adult: Secondary | ICD-10-CM | POA: Diagnosis not present

## 2018-03-15 DIAGNOSIS — K589 Irritable bowel syndrome without diarrhea: Secondary | ICD-10-CM | POA: Diagnosis not present

## 2018-03-15 DIAGNOSIS — I10 Essential (primary) hypertension: Secondary | ICD-10-CM | POA: Diagnosis not present

## 2018-03-15 DIAGNOSIS — J069 Acute upper respiratory infection, unspecified: Secondary | ICD-10-CM | POA: Diagnosis not present

## 2018-03-15 DIAGNOSIS — I2699 Other pulmonary embolism without acute cor pulmonale: Secondary | ICD-10-CM | POA: Diagnosis not present

## 2018-03-21 DIAGNOSIS — M9902 Segmental and somatic dysfunction of thoracic region: Secondary | ICD-10-CM | POA: Diagnosis not present

## 2018-03-22 DIAGNOSIS — M9902 Segmental and somatic dysfunction of thoracic region: Secondary | ICD-10-CM | POA: Diagnosis not present

## 2018-03-26 DIAGNOSIS — M9902 Segmental and somatic dysfunction of thoracic region: Secondary | ICD-10-CM | POA: Diagnosis not present

## 2018-03-28 DIAGNOSIS — M9902 Segmental and somatic dysfunction of thoracic region: Secondary | ICD-10-CM | POA: Diagnosis not present

## 2018-03-29 DIAGNOSIS — M9902 Segmental and somatic dysfunction of thoracic region: Secondary | ICD-10-CM | POA: Diagnosis not present

## 2018-04-02 DIAGNOSIS — M9902 Segmental and somatic dysfunction of thoracic region: Secondary | ICD-10-CM | POA: Diagnosis not present

## 2018-04-04 DIAGNOSIS — M9902 Segmental and somatic dysfunction of thoracic region: Secondary | ICD-10-CM | POA: Diagnosis not present

## 2018-04-05 DIAGNOSIS — M9902 Segmental and somatic dysfunction of thoracic region: Secondary | ICD-10-CM | POA: Diagnosis not present

## 2018-04-09 DIAGNOSIS — I10 Essential (primary) hypertension: Secondary | ICD-10-CM | POA: Diagnosis not present

## 2018-04-09 DIAGNOSIS — I639 Cerebral infarction, unspecified: Secondary | ICD-10-CM | POA: Diagnosis not present

## 2018-04-09 DIAGNOSIS — M9902 Segmental and somatic dysfunction of thoracic region: Secondary | ICD-10-CM | POA: Diagnosis not present

## 2018-04-11 DIAGNOSIS — M9902 Segmental and somatic dysfunction of thoracic region: Secondary | ICD-10-CM | POA: Diagnosis not present

## 2018-05-22 DIAGNOSIS — I639 Cerebral infarction, unspecified: Secondary | ICD-10-CM | POA: Diagnosis not present

## 2018-05-22 DIAGNOSIS — I10 Essential (primary) hypertension: Secondary | ICD-10-CM | POA: Diagnosis not present

## 2018-06-19 DIAGNOSIS — I639 Cerebral infarction, unspecified: Secondary | ICD-10-CM | POA: Diagnosis not present

## 2018-06-19 DIAGNOSIS — I10 Essential (primary) hypertension: Secondary | ICD-10-CM | POA: Diagnosis not present

## 2018-07-13 DIAGNOSIS — I639 Cerebral infarction, unspecified: Secondary | ICD-10-CM | POA: Diagnosis not present

## 2018-07-13 DIAGNOSIS — I10 Essential (primary) hypertension: Secondary | ICD-10-CM | POA: Diagnosis not present

## 2018-07-18 DIAGNOSIS — R5383 Other fatigue: Secondary | ICD-10-CM | POA: Diagnosis not present

## 2018-07-18 DIAGNOSIS — G4733 Obstructive sleep apnea (adult) (pediatric): Secondary | ICD-10-CM | POA: Diagnosis not present

## 2018-07-18 DIAGNOSIS — I6529 Occlusion and stenosis of unspecified carotid artery: Secondary | ICD-10-CM | POA: Diagnosis not present

## 2018-07-18 DIAGNOSIS — I6990 Unspecified sequelae of unspecified cerebrovascular disease: Secondary | ICD-10-CM | POA: Diagnosis not present

## 2018-08-17 DIAGNOSIS — I609 Nontraumatic subarachnoid hemorrhage, unspecified: Secondary | ICD-10-CM | POA: Diagnosis not present

## 2018-08-17 DIAGNOSIS — Z6841 Body Mass Index (BMI) 40.0 and over, adult: Secondary | ICD-10-CM | POA: Diagnosis not present

## 2018-08-17 DIAGNOSIS — Z1339 Encounter for screening examination for other mental health and behavioral disorders: Secondary | ICD-10-CM | POA: Diagnosis not present

## 2018-08-17 DIAGNOSIS — Z0000 Encounter for general adult medical examination without abnormal findings: Secondary | ICD-10-CM | POA: Diagnosis not present

## 2018-08-17 DIAGNOSIS — I10 Essential (primary) hypertension: Secondary | ICD-10-CM | POA: Diagnosis not present

## 2018-08-17 DIAGNOSIS — R5383 Other fatigue: Secondary | ICD-10-CM | POA: Diagnosis not present

## 2018-08-17 DIAGNOSIS — Z1331 Encounter for screening for depression: Secondary | ICD-10-CM | POA: Diagnosis not present

## 2018-08-17 DIAGNOSIS — G40909 Epilepsy, unspecified, not intractable, without status epilepticus: Secondary | ICD-10-CM | POA: Diagnosis not present

## 2018-08-17 DIAGNOSIS — Z79899 Other long term (current) drug therapy: Secondary | ICD-10-CM | POA: Diagnosis not present

## 2018-08-17 DIAGNOSIS — Z7189 Other specified counseling: Secondary | ICD-10-CM | POA: Diagnosis not present

## 2018-08-17 DIAGNOSIS — Z299 Encounter for prophylactic measures, unspecified: Secondary | ICD-10-CM | POA: Diagnosis not present

## 2018-09-13 DIAGNOSIS — Z23 Encounter for immunization: Secondary | ICD-10-CM | POA: Diagnosis not present

## 2018-09-25 DIAGNOSIS — I639 Cerebral infarction, unspecified: Secondary | ICD-10-CM | POA: Diagnosis not present

## 2018-09-25 DIAGNOSIS — I10 Essential (primary) hypertension: Secondary | ICD-10-CM | POA: Diagnosis not present

## 2018-10-22 DIAGNOSIS — I10 Essential (primary) hypertension: Secondary | ICD-10-CM | POA: Diagnosis not present

## 2018-10-22 DIAGNOSIS — I639 Cerebral infarction, unspecified: Secondary | ICD-10-CM | POA: Diagnosis not present

## 2018-11-30 DIAGNOSIS — G40909 Epilepsy, unspecified, not intractable, without status epilepticus: Secondary | ICD-10-CM | POA: Diagnosis not present

## 2018-11-30 DIAGNOSIS — Z6841 Body Mass Index (BMI) 40.0 and over, adult: Secondary | ICD-10-CM | POA: Diagnosis not present

## 2018-11-30 DIAGNOSIS — Z713 Dietary counseling and surveillance: Secondary | ICD-10-CM | POA: Diagnosis not present

## 2018-11-30 DIAGNOSIS — Z299 Encounter for prophylactic measures, unspecified: Secondary | ICD-10-CM | POA: Diagnosis not present

## 2018-11-30 DIAGNOSIS — I10 Essential (primary) hypertension: Secondary | ICD-10-CM | POA: Diagnosis not present

## 2018-12-12 DIAGNOSIS — I639 Cerebral infarction, unspecified: Secondary | ICD-10-CM | POA: Diagnosis not present

## 2018-12-12 DIAGNOSIS — I10 Essential (primary) hypertension: Secondary | ICD-10-CM | POA: Diagnosis not present

## 2018-12-17 ENCOUNTER — Telehealth (HOSPITAL_COMMUNITY): Payer: Self-pay

## 2018-12-17 NOTE — Telephone Encounter (Signed)
Called to schedule f/u mri, no answer, left vm. AW 

## 2018-12-18 ENCOUNTER — Other Ambulatory Visit (HOSPITAL_COMMUNITY): Payer: Self-pay | Admitting: Interventional Radiology

## 2018-12-18 DIAGNOSIS — I671 Cerebral aneurysm, nonruptured: Secondary | ICD-10-CM

## 2019-01-07 ENCOUNTER — Encounter (HOSPITAL_COMMUNITY): Payer: Self-pay

## 2019-01-07 ENCOUNTER — Ambulatory Visit (HOSPITAL_COMMUNITY)
Admission: RE | Admit: 2019-01-07 | Discharge: 2019-01-07 | Disposition: A | Discharge status: Home / Self Care | Payer: Medicare Other | Source: Ambulatory Visit | Attending: Interventional Radiology | Admitting: Interventional Radiology

## 2019-01-07 ENCOUNTER — Other Ambulatory Visit: Payer: Self-pay

## 2019-01-07 DIAGNOSIS — I671 Cerebral aneurysm, nonruptured: Secondary | ICD-10-CM | POA: Insufficient documentation

## 2019-01-07 LAB — CREATININE, SERUM
Creatinine, Ser: 1.05 mg/dL — ABNORMAL HIGH (ref 0.44–1.00)
GFR calc Af Amer: >60 mL/min (ref >60)
GFR calc non Af Amer: 54 mL/min — ABNORMAL LOW (ref >60)

## 2019-01-09 ENCOUNTER — Other Ambulatory Visit (HOSPITAL_COMMUNITY): Payer: Self-pay | Admitting: Interventional Radiology

## 2019-01-09 DIAGNOSIS — I671 Cerebral aneurysm, nonruptured: Secondary | ICD-10-CM

## 2019-01-11 DIAGNOSIS — I10 Essential (primary) hypertension: Secondary | ICD-10-CM | POA: Diagnosis not present

## 2019-01-11 DIAGNOSIS — I639 Cerebral infarction, unspecified: Secondary | ICD-10-CM | POA: Diagnosis not present

## 2019-01-16 ENCOUNTER — Other Ambulatory Visit: Payer: Self-pay | Admitting: Radiology

## 2019-01-18 ENCOUNTER — Ambulatory Visit (HOSPITAL_COMMUNITY): Payer: Medicare Other

## 2019-01-23 ENCOUNTER — Other Ambulatory Visit: Payer: Self-pay | Admitting: Radiology

## 2019-01-25 ENCOUNTER — Other Ambulatory Visit: Payer: Self-pay

## 2019-01-25 ENCOUNTER — Encounter (HOSPITAL_COMMUNITY): Payer: Self-pay

## 2019-01-25 ENCOUNTER — Ambulatory Visit (HOSPITAL_COMMUNITY)
Admission: RE | Admit: 2019-01-25 | Discharge: 2019-01-25 | Disposition: A | Discharge status: Home / Self Care | Payer: Medicare Other | Source: Ambulatory Visit | Attending: Interventional Radiology | Admitting: Interventional Radiology

## 2019-01-25 ENCOUNTER — Other Ambulatory Visit (HOSPITAL_COMMUNITY): Payer: Self-pay | Admitting: Interventional Radiology

## 2019-01-25 DIAGNOSIS — I671 Cerebral aneurysm, nonruptured: Secondary | ICD-10-CM | POA: Diagnosis not present

## 2019-01-25 DIAGNOSIS — Z880 Allergy status to penicillin: Secondary | ICD-10-CM | POA: Insufficient documentation

## 2019-01-25 DIAGNOSIS — Z9889 Other specified postprocedural states: Secondary | ICD-10-CM | POA: Diagnosis not present

## 2019-01-25 DIAGNOSIS — K219 Gastro-esophageal reflux disease without esophagitis: Secondary | ICD-10-CM | POA: Diagnosis not present

## 2019-01-25 DIAGNOSIS — Z86711 Personal history of pulmonary embolism: Secondary | ICD-10-CM | POA: Insufficient documentation

## 2019-01-25 DIAGNOSIS — Z8673 Personal history of transient ischemic attack (TIA), and cerebral infarction without residual deficits: Secondary | ICD-10-CM | POA: Diagnosis not present

## 2019-01-25 DIAGNOSIS — F329 Major depressive disorder, single episode, unspecified: Secondary | ICD-10-CM | POA: Diagnosis not present

## 2019-01-25 DIAGNOSIS — I10 Essential (primary) hypertension: Secondary | ICD-10-CM | POA: Insufficient documentation

## 2019-01-25 DIAGNOSIS — Z79899 Other long term (current) drug therapy: Secondary | ICD-10-CM | POA: Insufficient documentation

## 2019-01-25 DIAGNOSIS — E119 Type 2 diabetes mellitus without complications: Secondary | ICD-10-CM | POA: Diagnosis not present

## 2019-01-25 DIAGNOSIS — G40909 Epilepsy, unspecified, not intractable, without status epilepticus: Secondary | ICD-10-CM | POA: Diagnosis not present

## 2019-01-25 HISTORY — PX: IR ANGIO INTRA EXTRACRAN SEL COM CAROTID INNOMINATE BILAT MOD SED: IMG5360

## 2019-01-25 HISTORY — PX: IR ANGIO VERTEBRAL SEL SUBCLAVIAN INNOMINATE UNI L MOD SED: IMG5364

## 2019-01-25 LAB — BASIC METABOLIC PANEL
Anion gap: 11 (ref 5–15)
BUN: 13 mg/dL (ref 8–23)
CO2: 29 mmol/L (ref 22–32)
Calcium: 9.1 mg/dL (ref 8.9–10.3)
Chloride: 100 mmol/L (ref 98–111)
Creatinine, Ser: 1.11 mg/dL — ABNORMAL HIGH (ref 0.44–1.00)
GFR calc Af Amer: 58 mL/min — ABNORMAL LOW (ref >60)
GFR calc non Af Amer: 50 mL/min — ABNORMAL LOW (ref >60)
Glucose, Bld: 115 mg/dL — ABNORMAL HIGH (ref 70–99)
Potassium: 3.8 mmol/L (ref 3.5–5.1)
Sodium: 140 mmol/L (ref 135–145)

## 2019-01-25 LAB — PROTIME-INR
INR: 1 (ref 0.8–1.2)
Prothrombin Time: 12.9 s (ref 11.4–15.2)

## 2019-01-25 LAB — CBC
HCT: 37 % (ref 36.0–46.0)
Hemoglobin: 11.2 g/dL — ABNORMAL LOW (ref 12.0–15.0)
MCH: 26.2 pg (ref 26.0–34.0)
MCHC: 30.3 g/dL (ref 30.0–36.0)
MCV: 86.4 fL (ref 80.0–100.0)
Platelets: 237 10*3/uL (ref 150–400)
RBC: 4.28 MIL/uL (ref 3.87–5.11)
RDW: 16.8 % — ABNORMAL HIGH (ref 11.5–15.5)
WBC: 5 10*3/uL (ref 4.0–10.5)
nRBC: 0 % (ref 0.0–0.2)

## 2019-01-25 LAB — GLUCOSE, CAPILLARY: Glucose-Capillary: 91 mg/dL (ref 70–99)

## 2019-01-25 MED ORDER — MIDAZOLAM HCL 2 MG/2ML IJ SOLN
INTRAMUSCULAR | Status: AC
  Filled 2019-01-25: qty 2

## 2019-01-25 MED ORDER — SODIUM CHLORIDE 0.9 % IV SOLN: INTRAVENOUS | Status: AC

## 2019-01-25 MED ORDER — SODIUM CHLORIDE 0.9 % IV SOLN: Freq: Once | INTRAVENOUS | Status: DC

## 2019-01-25 MED ORDER — FENTANYL CITRATE (PF) 100 MCG/2ML IJ SOLN
INTRAMUSCULAR | Status: AC
  Filled 2019-01-25: qty 2

## 2019-01-25 MED ORDER — FENTANYL CITRATE (PF) 100 MCG/2ML IJ SOLN
INTRAMUSCULAR | Status: AC | PRN
  Administered 2019-01-25: 25 ug via INTRAVENOUS

## 2019-01-25 MED ORDER — NITROGLYCERIN 1 MG/10 ML FOR IR/CATH LAB
INTRA_ARTERIAL | Status: AC
  Filled 2019-01-25: qty 10

## 2019-01-25 MED ORDER — IOHEXOL 300 MG/ML  SOLN
150.0000 mL | Freq: Once | INTRAMUSCULAR | Status: AC | PRN
  Administered 2019-01-25: 78 mL via INTRA_ARTERIAL

## 2019-01-25 MED ORDER — LIDOCAINE HCL 1 % IJ SOLN
INTRAMUSCULAR | Status: AC | PRN
  Administered 2019-01-25: 10 mL

## 2019-01-25 MED ORDER — HEPARIN SODIUM (PORCINE) 1000 UNIT/ML IJ SOLN
INTRAMUSCULAR | Status: AC | PRN
  Administered 2019-01-25: 1000 [IU] via INTRAVENOUS

## 2019-01-25 MED ORDER — HEPARIN SODIUM (PORCINE) 1000 UNIT/ML IJ SOLN
INTRAMUSCULAR | Status: AC
  Filled 2019-01-25: qty 1

## 2019-01-25 MED ORDER — VERAPAMIL HCL 2.5 MG/ML IV SOLN
INTRAVENOUS | Status: AC
  Filled 2019-01-25: qty 2

## 2019-01-25 MED ORDER — LIDOCAINE HCL 1 % IJ SOLN
INTRAMUSCULAR | Status: AC
  Filled 2019-01-25: qty 20

## 2019-01-25 MED ORDER — MIDAZOLAM HCL 2 MG/2ML IJ SOLN
INTRAMUSCULAR | Status: AC | PRN
  Administered 2019-01-25: 1 mg via INTRAVENOUS

## 2019-01-25 NOTE — Sedation Documentation (Signed)
Pressure release on exo-seal. No bleeding noted at dressing.

## 2019-01-25 NOTE — H&P (Signed)
Chief Complaint: Patient was seen in consultation today for cerebral angiogram at the request of [place]   Supervising Physician: Luanne Bras  Patient Status: [facility] - Out-pt  History of Present Illness: [name] is a 69 y.o. female with hx of ruptured intracranial ACOM aneurysm s/p endovascular treatment. She is here for follow up diagnostic angiogram. PMHx, meds, labs, imaging, allergies reviewed. Feels well, no recent fevers, chills, illness. Has been NPO today as directed.  Past Medical History:  Diagnosis Date  . Attention deficit disorder of childhood with hyperactivity   . Depression   . Esophageal reflux   . Essential hypertension, benign   . PE (pulmonary embolism)    Status post IVC placement  . Recurrent UTI   . SAH (subarachnoid hemorrhage) ([place]) 08/16/11   Coiling of ACA aneurysm, VP shunt  . Seizure disorder ([place])   . Stroke ([place])   . Type 2 diabetes mellitus ([place])     Past Surgical History:  Procedure Laterality Date  . ABDOMINAL HYSTERECTOMY    . ANEURYSM COILING  08/16/11  . IR ANGIO INTRA EXTRACRAN SEL COM CAROTID INNOMINATE BILAT MOD SED  12/16/2016  . IR ANGIO VERTEBRAL SEL SUBCLAVIAN INNOMINATE BILAT MOD SED  12/16/2016  . VENTRICULOPERITONEAL SHUNT  09/02/2011   Procedure: SHUNT INSERTION VENTRICULAR-PERITONEAL;  Surgeon: Erline Levine, MD;  Location: [place] [facility];  Service: Neurosurgery;  Laterality: Right;    Allergies: Penicillins  Medications: Prior to Admission medications   Medication Sig Start Date End Date Taking? Authorizing Provider  allopurinol (ZYLOPRIM) 300 MG tablet Take 150 mg by mouth daily.    Yes [provider]  amLODipine (NORVASC) 10 MG tablet Take 1 tablet (10 mg total) by mouth daily. 02/12/12  Yes Dhungel, Nishant, MD  citalopram (CELEXA) 20 MG tablet Take 10 mg by mouth daily.    Yes [provider]  ferrous sulfate 325 (65 FE) MG tablet Take 325 mg daily with breakfast by mouth.    Yes [provider]  furosemide (LASIX) 40 MG tablet Take 40 mg by mouth daily.   Yes [provider]  hydrALAZINE (APRESOLINE) 25 MG tablet Take 25 mg by mouth 2 (two) times daily. Noon & bedtime   Yes [provider]  labetalol (NORMODYNE) 200 MG tablet Take 400 mg by mouth 2 (two) times daily.    Yes [provider]  levETIRAcetam (KEPPRA) 500 MG tablet Take 1 tablet (500 mg total) by mouth 2 (two) times daily. 02/23/12  Yes Meredith Staggers, MD  pantoprazole (PROTONIX) 40 MG tablet Take 40 mg by mouth daily.   Yes [provider]  potassium chloride SA (K-DUR,KLOR-CON) 20 MEQ tablet Take 20 mEq by mouth daily.    Yes [provider]     Family History  Problem Relation Age of Onset  . Hypertension Other     Social History   Socioeconomic History  . Marital status: Married    Spouse name: Not on file  . Number of children: Not on file  . Years of education: Not on file  . Highest education level: Not on file  Occupational History  . Occupation: Disability    Comment: Retired from [employer] and [employer]  . Smoking status: Never Smoker  . Smokeless tobacco: Never Used  Substance and Sexual Activity  . Alcohol use: No  . Drug use: No  . Sexual activity: Not on file  Other Topics Concern  . Not on file  Social History  Narrative  . Not on file   Social Determinants of Health   Financial Resource Strain:   . Difficulty of Paying Living Expenses: Not on file  Food Insecurity:   . Worried About Charity fundraiser in the Last Year: Not on file  . Ran Out of Food in the Last Year: Not on file  Transportation Needs:   . Lack of Transportation (Medical): Not on file  . Lack of Transportation (Non-Medical): Not on file  Physical Activity:   . Days of Exercise per Week: Not on file  . Minutes of Exercise per Session: Not on file  Stress:   . Feeling of Stress : Not on file  Social Connections:   . Frequency of  Communication with Friends and Family: Not on file  . Frequency of Social Gatherings with Friends and Family: Not on file  . Attends Religious Services: Not on file  . Active Member of Clubs or Organizations: Not on file  . Attends Archivist Meetings: Not on file  . Marital Status: Not on file     Review of Systems: A 12 point ROS discussed and pertinent positives are indicated in the HPI above.  All other systems are negative.  Review of Systems  Vital Signs: BP (!) 153/83   Pulse 64   Temp 98.1 F (36.7 C) (Oral)   Resp 18   Ht 5\' 5"  (1.651 m)   Wt 118.8 kg   SpO2 94%   BMI 43.60 kg/m   Physical Exam Constitutional:      General: She is not in acute distress.    Appearance: Normal appearance. She is not ill-appearing.  HENT:     Mouth/Throat:     Mouth: Mucous membranes are moist.     Pharynx: Oropharynx is clear.  Cardiovascular:     Rate and Rhythm: Normal rate and regular rhythm.     Pulses: Normal pulses.     Heart sounds: Normal heart sounds.  Pulmonary:     Effort: Pulmonary effort is normal. No respiratory distress.     Breath sounds: Normal breath sounds.  Abdominal:     General: Abdomen is flat. There is no distension.     Palpations: Abdomen is soft.     Tenderness: There is no abdominal tenderness.  Skin:    General: Skin is warm and dry.  Neurological:     General: No focal deficit present.     Mental Status: She is alert and oriented to person, place, and time.  Psychiatric:        Mood and Affect: Mood normal.        Thought Content: Thought content normal.        Judgment: Judgment normal.     Imaging: No results found.  Labs:  CBC: Recent Labs    01/25/19 0639  WBC 5.0  HGB 11.2*  HCT 37.0  PLT 237    COAGS: Recent Labs    01/25/19 0639  INR 1.0    BMP: Recent Labs    01/07/19 1540 01/25/19 0639  NA  --  140  K  --  3.8  CL  --  100  CO2  --  29  GLUCOSE  --  115*  BUN  --  13  CALCIUM  --  9.1    CREATININE 1.05* 1.11*  GFRNONAA 54* 50*  GFRAA >60 58*    LIVER FUNCTION TESTS: No results for input(s): BILITOT, AST, ALT, ALKPHOS, PROT, ALBUMIN in the  last 8760 hours.  TUMOR MARKERS: No results for input(s): AFPTM, CEA, CA199, CHROMGRNA in the last 8760 hours.  Assessment and Plan: Hx of previously treated ruptured ACOM aneurysm For diagnostic cerebral arteriogram today Labs ok Risks and benefits of cerebral angio were discussed with the patient including, but not limited to bleeding, infection, vascular injury or contrast induced renal failure.  This interventional procedure involves the use of X-rays and because of the nature of the planned procedure, it is possible that we will have prolonged use of X-ray fluoroscopy.  Potential radiation risks to you include (but are not limited to) the following: - A slightly elevated risk for cancer  several years later in life. This risk is typically less than 0.5% percent. This risk is low in comparison to the normal incidence of human cancer, which is 33% for women and 50% for men according to the [place]. - Radiation induced injury can include skin redness, resembling a rash, tissue breakdown / ulcers and hair loss (which can be temporary or permanent).   The likelihood of either of these occurring depends on the difficulty of the procedure and whether you are sensitive to radiation due to previous procedures, disease, or genetic conditions.   IF your procedure requires a prolonged use of radiation, you will be notified and given written instructions for further action.  It is your responsibility to monitor the irradiated area for the 2 weeks following the procedure and to notify your physician if you are concerned that you have suffered a radiation induced injury.    All of the patient's questions were answered, patient is agreeable to proceed.  Consent signed and in chart.    Thank you for this interesting  consult.  I greatly enjoyed meeting [name] and look forward to participating in their care.  A copy of this report was sent to the requesting provider on this date.  Electronically Signed: Ascencion Dike, PA-C 01/25/2019, 7:45 AM   I spent a total of 25 minutes in face to face in clinical consultation, greater than 50% of which was counseling/coordinating care for cerebral angio

## 2019-01-25 NOTE — Sedation Documentation (Signed)
5 french exoseal closure device deployed. 

## 2019-01-25 NOTE — Discharge Instructions (Signed)
Femoral Site Care This sheet gives you information about how to care for yourself after your procedure. Your health care provider may also give you more specific instructions. If you have problems or questions, contact your health care provider. What can I expect after the procedure? After the procedure, it is common to have:  Bruising that usually fades within 1-2 weeks.  Tenderness at the site. Follow these instructions at home: Wound care  Follow instructions from your health care provider about how to take care of your insertion site. Make sure you: ? Wash your hands with soap and water before you change your bandage (dressing). If soap and water are not available, use hand sanitizer. ? Remove your dressing as told by your health care provider. In 24-48 hours  Do not take baths, swim, or use a hot tub until your health care provider approves.  You may shower 24-48 hours after the procedure or as told by your health care provider. ? Gently wash the site with plain soap and water. ? Pat the area dry with a clean towel. ? Do not rub the site. This may cause bleeding.  Do not apply powder or lotion to the site. Keep the site clean and dry.  Check your femoral site every day for signs of infection. Check for: ? Redness, swelling, or pain. ? Fluid or blood. ? Warmth. ? Pus or a bad smell. Activity  For the first 2-3 days after your procedure, or as long as directed: ? Avoid climbing stairs as much as possible. ? Do not squat.  Do not lift anything that is heavier than 10 lb (4.5 kg), or the limit that you are told, until your health care provider says that it is safe. For 5 days  Rest as directed. ? Avoid sitting for a long time without moving. Get up to take short walks every 1-2 hours.  Do not drive for 24 hours if you were given a medicine to help you relax (sedative). General instructions  Take over-the-counter and prescription medicines only as told by your health care  provider.  Keep all follow-up visits as told by your health care provider. This is important. Contact a health care provider if you have:  A fever or chills.  You have redness, swelling, or pain around your insertion site. Get help right away if:  The catheter insertion area swells very fast.  You pass out.  You suddenly start to sweat or your skin gets clammy.  The catheter insertion area is bleeding, and the bleeding does not stop when you hold steady pressure on the area.  The area near or just beyond the catheter insertion site becomes pale, cool, tingly, or numb. These symptoms may represent a serious problem that is an emergency. Do not wait to see if the symptoms will go away. Get medical help right away. Call your local emergency services (911 in the [place] Do not drive yourself to the hospital. Summary  After the procedure, it is common to have bruising that usually fades within 1-2 weeks.  Check your femoral site every day for signs of infection.  Do not lift anything that is heavier than 10 lb (4.5 kg), or the limit that you are told, until your health care provider says that it is safe. This information is not intended to replace advice given to you by your health care provider. Make sure you discuss any questions you have with your health care provider. Document Revised: 01/02/2017 Document Reviewed: 01/02/2017 Elsevier  Patient Education  [employer].  Cerebral Angiogram, Care After This sheet gives you information about how to care for yourself after your procedure. Your health care provider may also give you more specific instructions. If you have problems or questions, contact your health care provider. What can I expect after the procedure? After the procedure, it is common to have:  Bruising and tenderness at the catheter insertion site.  A mild headache. Follow these instructions at home: Insertion site care  Follow instructions from your health  care provider about how to take care of the insertion site. Make sure you: ? Wash your hands with soap and water before and after you change your bandage (dressing). If soap and water are not available, use hand sanitizer. ? Change your dressing as told by your health care provider.  Do not take baths, swim, or use a hot tub until your health care provider approves. You may shower 24-48 hours after the procedure, or as told by your health care provider.  To clean your insertion site: ? Gently wash the site with plain soap and water. ? Pat the area dry with a clean towel. ? Do not rub the site. This may cause bleeding.  Do not apply powder or lotion to the site. Keep the site clean and dry. Infection signs Check your incision area every day for signs of infection. Check for:  Redness, swelling, or pain.  Fluid or blood.  Warmth.  Pus or a bad smell.  Activity  Do not drive for 24 hours if you were given a sedative during your procedure.  Rest as told by your health care provider.  Do not lift anything that is heavier than 10 lb (4.5 kg), or the limit that you are told, until your health care provider says that it is safe.  Return to your normal activities as told by your health care provider, usually in about a week. Ask your health care provider what activities are safe for you. General instructions   If your insertion site starts to bleed, lie flat and put pressure on the site. If the bleeding does not stop, get help right away. This is a medical emergency.  Do not use any products that contain nicotine or tobacco, such as cigarettes, e-cigarettes, and chewing tobacco. If you need help quitting, ask your health care provider.  Take over-the-counter and prescription medicines only as told by your health care provider.  Drink enough fluid to keep your urine pale yellow. This helps flush the contrast dye from your body.  Keep all follow-up visits as directed by your health  care provider. This is important. Contact a health care provider if:  You have a fever or chills.  You have redness, swelling, or pain around your insertion site.  You have fluid or blood coming from your insertion site.  The insertion site feels warm to the touch.  You have pus or a bad smell coming from your insertion site.  You notice blood collecting in the tissue around the insertion site (hematoma). The hematoma may be painful to the touch. Get help right away if:  You have chest pain or trouble breathing.  You have severe pain or swelling at the insertion site.  The insertion area bleeds, and bleeding continues after 30 minutes of holding steady pressure on the site.  The arm or leg where the catheter was inserted is pale, cold, numb, tingling, or weak.  You have a rash.  You have any symptoms  of a stroke. "BE FAST" is an easy way to remember the main warning signs of a stroke: ? B - Balance. Signs are dizziness, sudden trouble walking, or loss of balance. ? E - Eyes. Signs are trouble seeing or a sudden change in vision. ? F - Face. Signs are sudden weakness or numbness of the face, or the face or eyelid drooping on one side. ? A - Arms. Signs are weakness or numbness in an arm. This happens suddenly and usually on one side of the body. ? S - Speech. Signs are sudden trouble speaking, slurred speech, or trouble understanding what people say. ? T - Time. Time to call emergency services. Write down what time symptoms started.  You have other signs of a stroke, such as: ? A sudden, severe headache with no known cause. ? Nausea or vomiting. ? Seizure. These symptoms may represent a serious problem that is an emergency. Do not wait to see if the symptoms will go away. Get medical help right away. Call your local emergency services (911 in the [place] Do not drive yourself to the hospital. Summary  Bruising and tenderness at the insertion site are common.  Follow your  health care provider's instructions about caring for your insertion site. Change dressing and clean the area as instructed.  If your insertion site bleeds, apply direct pressure until bleeding stops.  Return to your normal activities as told by your health care provider. Ask what activities are safe.  Rest and drink plenty of fluids. This information is not intended to replace advice given to you by your health care provider. Make sure you discuss any questions you have with your health care provider. Document Revised: 07/10/2018 Document Reviewed: 07/10/2018 Elsevier Patient Education  [employer]. Moderate Conscious Sedation, Adult Sedation is the use of medicines to promote relaxation and relieve discomfort and anxiety. Moderate conscious sedation is a type of sedation. Under moderate conscious sedation, you are less alert than normal, but you are still able to respond to instructions, touch, or both. Moderate conscious sedation is used during short medical and dental procedures. It is milder than deep sedation, which is a type of sedation under which you cannot be easily woken up. It is also milder than general anesthesia, which is the use of medicines to make you unconscious. Moderate conscious sedation allows you to return to your regular activities sooner. Tell a health care provider about:  Any allergies you have.  All medicines you are taking, including vitamins, herbs, eye drops, creams, and over-the-counter medicines.  Use of steroids (by mouth or creams).  Any problems you or family members have had with sedatives and anesthetic medicines.  Any blood disorders you have.  Any surgeries you have had.  Any medical conditions you have, such as sleep apnea.  Whether you are pregnant or may be pregnant.  Any use of cigarettes, alcohol, marijuana, or street drugs. What are the risks? Generally, this is a safe procedure. However, problems may occur, including:  Getting  too much medicine (oversedation).  Nausea.  Allergic reaction to medicines.  Trouble breathing. If this happens, a breathing tube may be used to help with breathing. It will be removed when you are awake and breathing on your own.  Heart trouble.  Lung trouble. What happens before the procedure? Staying hydrated Follow instructions from your health care provider about hydration, which may include:  Up to 2 hours before the procedure - you may continue to drink clear liquids, such  as water, clear fruit juice, black coffee, and plain tea. Eating and drinking restrictions Follow instructions from your health care provider about eating and drinking, which may include:  8 hours before the procedure - stop eating heavy meals or foods such as meat, fried foods, or fatty foods.  6 hours before the procedure - stop eating light meals or foods, such as toast or cereal.  6 hours before the procedure - stop drinking milk or drinks that contain milk.  2 hours before the procedure - stop drinking clear liquids. Medicine Ask your health care provider about:  Changing or stopping your regular medicines. This is especially important if you are taking diabetes medicines or blood thinners.  Taking medicines such as aspirin and ibuprofen. These medicines can thin your blood. Do not take these medicines before your procedure if your health care provider instructs you not to.  Tests and exams  You will have a physical exam.  You may have blood tests done to show: ? How well your kidneys and liver are working. ? How well your blood can clot. General instructions  Plan to have someone take you home from the hospital or clinic.  If you will be going home right after the procedure, plan to have someone with you for 24 hours. What happens during the procedure?  An IV tube will be inserted into one of your veins.  Medicine to help you relax (sedative) will be given through the IV tube.  The  medical or dental procedure will be performed. What happens after the procedure?  Your blood pressure, heart rate, breathing rate, and blood oxygen level will be monitored often until the medicines you were given have worn off.  Do not drive for 24 hours. This information is not intended to replace advice given to you by your health care provider. Make sure you discuss any questions you have with your health care provider. Document Revised: 12/02/2016 Document Reviewed: 04/11/2015 Elsevier Patient Education  2020 [employer].

## 2019-01-25 NOTE — Procedures (Signed)
S/P bilateral common and Lt vertebral arteriogram  RT CFA approach. Findings. 1.Stable occluded ACOM aneurysm with coils. .. 2. Stable approx 2.1 mmx 2.6 mm neck remnant. S.Yarithza Mink MD

## 2019-01-25 NOTE — Sedation Documentation (Addendum)
Attempted 6 french angio-seal closure device but unable to deploy.

## 2019-01-30 ENCOUNTER — Encounter (HOSPITAL_COMMUNITY): Payer: Self-pay

## 2019-02-17 ENCOUNTER — Ambulatory Visit: Payer: Medicare Other | Attending: Internal Medicine

## 2019-02-17 ENCOUNTER — Other Ambulatory Visit: Payer: Self-pay

## 2019-02-17 DIAGNOSIS — Z23 Encounter for immunization: Secondary | ICD-10-CM | POA: Insufficient documentation

## 2019-02-17 NOTE — Progress Notes (Signed)
   Covid-19 Vaccination Clinic  Name:  DESSIREE PORTALATIN    MRN: [id number] DOB: [birth year]  02/17/2019  Ms. Rawles was observed post Covid-19 immunization for 15 minutes without incidence. She was provided with Vaccine Information Sheet and instruction to access the V-Safe system.   Ms. Blatchford was instructed to call 911 with any severe reactions post vaccine: Marland Kitchen Difficulty breathing  . Swelling of your face and throat  . A fast heartbeat  . A bad rash all over your body  . Dizziness and weakness    Immunizations Administered    Name Date Dose VIS Date Route   Moderna COVID-19 Vaccine 02/17/2019  2:30 PM 0.5 mL 12/04/2018 Intramuscular   Manufacturer: Moderna   Lot: [id number]   [id number]

## 2019-02-28 DIAGNOSIS — Z6841 Body Mass Index (BMI) 40.0 and over, adult: Secondary | ICD-10-CM | POA: Diagnosis not present

## 2019-02-28 DIAGNOSIS — Z789 Other specified health status: Secondary | ICD-10-CM | POA: Diagnosis not present

## 2019-02-28 DIAGNOSIS — G40909 Epilepsy, unspecified, not intractable, without status epilepticus: Secondary | ICD-10-CM | POA: Diagnosis not present

## 2019-02-28 DIAGNOSIS — N1831 Chronic kidney disease, stage 3a: Secondary | ICD-10-CM | POA: Diagnosis not present

## 2019-02-28 DIAGNOSIS — Z299 Encounter for prophylactic measures, unspecified: Secondary | ICD-10-CM | POA: Diagnosis not present

## 2019-02-28 DIAGNOSIS — I10 Essential (primary) hypertension: Secondary | ICD-10-CM | POA: Diagnosis not present

## 2019-03-05 DIAGNOSIS — I10 Essential (primary) hypertension: Secondary | ICD-10-CM | POA: Diagnosis not present

## 2019-03-05 DIAGNOSIS — I639 Cerebral infarction, unspecified: Secondary | ICD-10-CM | POA: Diagnosis not present

## 2019-03-17 ENCOUNTER — Ambulatory Visit: Payer: Medicare Other | Attending: Internal Medicine

## 2019-03-17 DIAGNOSIS — Z23 Encounter for immunization: Secondary | ICD-10-CM

## 2019-03-17 NOTE — Progress Notes (Signed)
   Covid-19 Vaccination Clinic  Name:  GEOFFREY NEDVED    MRN: [id number] DOB: 09-22-1950  03/17/2019  Ms. Woolen was observed post Covid-19 immunization for 15 minutes without incident. She was provided with Vaccine Information Sheet and instruction to access the V-Safe system.   Ms. Lahood was instructed to call 911 with any severe reactions post vaccine: Marland Kitchen Difficulty breathing  . Swelling of face and throat  . A fast heartbeat  . A bad rash all over body  . Dizziness and weakness   Immunizations Administered    Name Date Dose VIS Date Route   Moderna COVID-19 Vaccine 03/17/2019  1:38 PM 0.5 mL 12/04/2018 Intramuscular   Manufacturer: Moderna   Lot: [id number]   [id number]

## 2019-04-15 DIAGNOSIS — I10 Essential (primary) hypertension: Secondary | ICD-10-CM | POA: Diagnosis not present

## 2019-04-15 DIAGNOSIS — I639 Cerebral infarction, unspecified: Secondary | ICD-10-CM | POA: Diagnosis not present

## 2019-05-30 DIAGNOSIS — Z299 Encounter for prophylactic measures, unspecified: Secondary | ICD-10-CM | POA: Diagnosis not present

## 2019-05-30 DIAGNOSIS — I10 Essential (primary) hypertension: Secondary | ICD-10-CM | POA: Diagnosis not present

## 2019-05-30 DIAGNOSIS — N1831 Chronic kidney disease, stage 3a: Secondary | ICD-10-CM | POA: Diagnosis not present

## 2019-05-30 DIAGNOSIS — Z6841 Body Mass Index (BMI) 40.0 and over, adult: Secondary | ICD-10-CM | POA: Diagnosis not present

## 2019-06-02 DIAGNOSIS — I639 Cerebral infarction, unspecified: Secondary | ICD-10-CM | POA: Diagnosis not present

## 2019-06-02 DIAGNOSIS — I10 Essential (primary) hypertension: Secondary | ICD-10-CM | POA: Diagnosis not present

## 2019-06-27 DIAGNOSIS — H52223 Regular astigmatism, bilateral: Secondary | ICD-10-CM | POA: Diagnosis not present

## 2019-06-27 DIAGNOSIS — H2513 Age-related nuclear cataract, bilateral: Secondary | ICD-10-CM | POA: Diagnosis not present

## 2019-06-27 DIAGNOSIS — H35031 Hypertensive retinopathy, right eye: Secondary | ICD-10-CM | POA: Diagnosis not present

## 2019-06-27 DIAGNOSIS — H5203 Hypermetropia, bilateral: Secondary | ICD-10-CM | POA: Diagnosis not present

## 2019-06-27 DIAGNOSIS — H524 Presbyopia: Secondary | ICD-10-CM | POA: Diagnosis not present

## 2019-07-03 DIAGNOSIS — I10 Essential (primary) hypertension: Secondary | ICD-10-CM | POA: Diagnosis not present

## 2019-07-03 DIAGNOSIS — I639 Cerebral infarction, unspecified: Secondary | ICD-10-CM | POA: Diagnosis not present

## 2019-07-18 DIAGNOSIS — I10 Essential (primary) hypertension: Secondary | ICD-10-CM | POA: Diagnosis not present

## 2019-07-18 DIAGNOSIS — R5383 Other fatigue: Secondary | ICD-10-CM | POA: Diagnosis not present

## 2019-07-18 DIAGNOSIS — I6529 Occlusion and stenosis of unspecified carotid artery: Secondary | ICD-10-CM | POA: Diagnosis not present

## 2019-07-18 DIAGNOSIS — I6990 Unspecified sequelae of unspecified cerebrovascular disease: Secondary | ICD-10-CM | POA: Diagnosis not present

## 2019-07-18 DIAGNOSIS — G4733 Obstructive sleep apnea (adult) (pediatric): Secondary | ICD-10-CM | POA: Diagnosis not present

## 2019-07-18 DIAGNOSIS — G2581 Restless legs syndrome: Secondary | ICD-10-CM | POA: Diagnosis not present

## 2019-08-02 DIAGNOSIS — I10 Essential (primary) hypertension: Secondary | ICD-10-CM | POA: Diagnosis not present

## 2019-08-02 DIAGNOSIS — I639 Cerebral infarction, unspecified: Secondary | ICD-10-CM | POA: Diagnosis not present

## 2019-08-14 DIAGNOSIS — I10 Essential (primary) hypertension: Secondary | ICD-10-CM | POA: Diagnosis not present

## 2019-08-14 DIAGNOSIS — I639 Cerebral infarction, unspecified: Secondary | ICD-10-CM | POA: Diagnosis not present

## 2019-08-26 DIAGNOSIS — N183 Chronic kidney disease, stage 3 unspecified: Secondary | ICD-10-CM | POA: Diagnosis not present

## 2019-08-26 DIAGNOSIS — Z7189 Other specified counseling: Secondary | ICD-10-CM | POA: Diagnosis not present

## 2019-08-26 DIAGNOSIS — Z299 Encounter for prophylactic measures, unspecified: Secondary | ICD-10-CM | POA: Diagnosis not present

## 2019-08-26 DIAGNOSIS — R5383 Other fatigue: Secondary | ICD-10-CM | POA: Diagnosis not present

## 2019-08-26 DIAGNOSIS — Z6841 Body Mass Index (BMI) 40.0 and over, adult: Secondary | ICD-10-CM | POA: Diagnosis not present

## 2019-08-26 DIAGNOSIS — Z1339 Encounter for screening examination for other mental health and behavioral disorders: Secondary | ICD-10-CM | POA: Diagnosis not present

## 2019-08-26 DIAGNOSIS — Z0000 Encounter for general adult medical examination without abnormal findings: Secondary | ICD-10-CM | POA: Diagnosis not present

## 2019-08-26 DIAGNOSIS — Z1331 Encounter for screening for depression: Secondary | ICD-10-CM | POA: Diagnosis not present

## 2019-08-26 DIAGNOSIS — I10 Essential (primary) hypertension: Secondary | ICD-10-CM | POA: Diagnosis not present

## 2019-08-26 DIAGNOSIS — G40909 Epilepsy, unspecified, not intractable, without status epilepticus: Secondary | ICD-10-CM | POA: Diagnosis not present

## 2019-09-20 DIAGNOSIS — Z23 Encounter for immunization: Secondary | ICD-10-CM | POA: Diagnosis not present

## 2019-10-03 DIAGNOSIS — I10 Essential (primary) hypertension: Secondary | ICD-10-CM | POA: Diagnosis not present

## 2019-10-03 DIAGNOSIS — I639 Cerebral infarction, unspecified: Secondary | ICD-10-CM | POA: Diagnosis not present

## 2019-10-25 DIAGNOSIS — Z23 Encounter for immunization: Secondary | ICD-10-CM | POA: Diagnosis not present

## 2019-11-01 DIAGNOSIS — I639 Cerebral infarction, unspecified: Secondary | ICD-10-CM | POA: Diagnosis not present

## 2019-11-01 DIAGNOSIS — I10 Essential (primary) hypertension: Secondary | ICD-10-CM | POA: Diagnosis not present

## 2019-11-14 DIAGNOSIS — Z1231 Encounter for screening mammogram for malignant neoplasm of breast: Secondary | ICD-10-CM | POA: Diagnosis not present

## 2019-12-03 DIAGNOSIS — I10 Essential (primary) hypertension: Secondary | ICD-10-CM | POA: Diagnosis not present

## 2019-12-03 DIAGNOSIS — I639 Cerebral infarction, unspecified: Secondary | ICD-10-CM | POA: Diagnosis not present

## 2020-01-02 DIAGNOSIS — I10 Essential (primary) hypertension: Secondary | ICD-10-CM | POA: Diagnosis not present

## 2020-01-02 DIAGNOSIS — I639 Cerebral infarction, unspecified: Secondary | ICD-10-CM | POA: Diagnosis not present

## 2020-01-30 ENCOUNTER — Other Ambulatory Visit (HOSPITAL_COMMUNITY): Payer: Self-pay | Admitting: Interventional Radiology

## 2020-01-30 DIAGNOSIS — I671 Cerebral aneurysm, nonruptured: Secondary | ICD-10-CM

## 2020-02-03 ENCOUNTER — Telehealth (HOSPITAL_COMMUNITY): Payer: Self-pay

## 2020-02-03 NOTE — Telephone Encounter (Signed)
Called to schedule mri, no answer, left vm. AW 

## 2020-02-11 ENCOUNTER — Other Ambulatory Visit (HOSPITAL_COMMUNITY): Payer: Self-pay

## 2020-02-11 ENCOUNTER — Other Ambulatory Visit (HOSPITAL_COMMUNITY): Payer: Self-pay | Admitting: Interventional Radiology

## 2020-02-11 DIAGNOSIS — I671 Cerebral aneurysm, nonruptured: Secondary | ICD-10-CM

## 2020-02-20 ENCOUNTER — Ambulatory Visit (HOSPITAL_COMMUNITY)
Admission: EM | Admit: 2020-02-20 | Discharge: 2020-02-20 | Disposition: A | Discharge status: Home / Self Care | Payer: Medicare Other | Source: Ambulatory Visit | Attending: Interventional Radiology | Admitting: Interventional Radiology

## 2020-02-20 ENCOUNTER — Emergency Department (HOSPITAL_COMMUNITY)
Admission: EM | Admit: 2020-02-20 | Discharge: 2020-02-20 | Discharge status: Absent without leave | Payer: Medicare Other

## 2020-02-20 ENCOUNTER — Other Ambulatory Visit: Payer: Self-pay

## 2020-02-20 DIAGNOSIS — I671 Cerebral aneurysm, nonruptured: Secondary | ICD-10-CM | POA: Insufficient documentation

## 2020-02-20 DIAGNOSIS — I651 Occlusion and stenosis of basilar artery: Secondary | ICD-10-CM | POA: Insufficient documentation

## 2020-02-20 DIAGNOSIS — I6621 Occlusion and stenosis of right posterior cerebral artery: Secondary | ICD-10-CM | POA: Diagnosis not present

## 2020-02-20 LAB — POCT I-STAT CREATININE: Creatinine, Ser: 1 mg/dL (ref 0.44–1.00)

## 2020-02-20 MED ORDER — IOHEXOL 350 MG/ML SOLN
100.0000 mL | Freq: Once | INTRAVENOUS | Status: AC | PRN
  Administered 2020-02-20: 100 mL via INTRAVENOUS

## 2020-02-21 ENCOUNTER — Other Ambulatory Visit (HOSPITAL_COMMUNITY): Payer: Self-pay | Admitting: Interventional Radiology

## 2020-02-21 DIAGNOSIS — I671 Cerebral aneurysm, nonruptured: Secondary | ICD-10-CM

## 2020-02-24 ENCOUNTER — Other Ambulatory Visit: Payer: Self-pay

## 2020-02-24 ENCOUNTER — Ambulatory Visit (HOSPITAL_COMMUNITY)
Admission: RE | Admit: 2020-02-24 | Discharge: 2020-02-24 | Disposition: A | Discharge status: Home / Self Care | Payer: Medicare Other | Source: Ambulatory Visit | Attending: Interventional Radiology | Admitting: Interventional Radiology

## 2020-02-24 DIAGNOSIS — Z9889 Other specified postprocedural states: Secondary | ICD-10-CM | POA: Diagnosis not present

## 2020-02-24 DIAGNOSIS — I651 Occlusion and stenosis of basilar artery: Secondary | ICD-10-CM | POA: Diagnosis not present

## 2020-02-24 DIAGNOSIS — I6621 Occlusion and stenosis of right posterior cerebral artery: Secondary | ICD-10-CM | POA: Diagnosis not present

## 2020-02-24 DIAGNOSIS — I671 Cerebral aneurysm, nonruptured: Secondary | ICD-10-CM

## 2020-02-25 ENCOUNTER — Other Ambulatory Visit (HOSPITAL_COMMUNITY): Payer: Self-pay | Admitting: Interventional Radiology

## 2020-02-25 DIAGNOSIS — I771 Stricture of artery: Secondary | ICD-10-CM

## 2020-02-25 DIAGNOSIS — I671 Cerebral aneurysm, nonruptured: Secondary | ICD-10-CM

## 2020-02-25 HISTORY — PX: IR RADIOLOGIST EVAL & MGMT: IMG5224

## 2020-03-02 ENCOUNTER — Other Ambulatory Visit: Payer: Self-pay | Admitting: Radiology

## 2020-03-03 ENCOUNTER — Ambulatory Visit (HOSPITAL_COMMUNITY)
Admission: RE | Admit: 2020-03-03 | Discharge: 2020-03-03 | Disposition: A | Discharge status: Home / Self Care | Payer: Medicare Other | Source: Ambulatory Visit | Attending: Interventional Radiology | Admitting: Interventional Radiology

## 2020-03-03 ENCOUNTER — Encounter (HOSPITAL_COMMUNITY): Payer: Self-pay

## 2020-03-03 ENCOUNTER — Other Ambulatory Visit (HOSPITAL_COMMUNITY): Payer: Self-pay | Admitting: Interventional Radiology

## 2020-03-03 ENCOUNTER — Other Ambulatory Visit: Payer: Self-pay

## 2020-03-03 DIAGNOSIS — E119 Type 2 diabetes mellitus without complications: Secondary | ICD-10-CM | POA: Diagnosis not present

## 2020-03-03 DIAGNOSIS — I771 Stricture of artery: Secondary | ICD-10-CM

## 2020-03-03 DIAGNOSIS — I671 Cerebral aneurysm, nonruptured: Secondary | ICD-10-CM | POA: Insufficient documentation

## 2020-03-03 DIAGNOSIS — I6621 Occlusion and stenosis of right posterior cerebral artery: Secondary | ICD-10-CM | POA: Diagnosis not present

## 2020-03-03 DIAGNOSIS — I10 Essential (primary) hypertension: Secondary | ICD-10-CM | POA: Diagnosis not present

## 2020-03-03 DIAGNOSIS — Z90710 Acquired absence of both cervix and uterus: Secondary | ICD-10-CM | POA: Diagnosis not present

## 2020-03-03 DIAGNOSIS — Z86711 Personal history of pulmonary embolism: Secondary | ICD-10-CM | POA: Diagnosis not present

## 2020-03-03 DIAGNOSIS — Z79899 Other long term (current) drug therapy: Secondary | ICD-10-CM | POA: Insufficient documentation

## 2020-03-03 DIAGNOSIS — I651 Occlusion and stenosis of basilar artery: Secondary | ICD-10-CM | POA: Insufficient documentation

## 2020-03-03 DIAGNOSIS — Z8249 Family history of ischemic heart disease and other diseases of the circulatory system: Secondary | ICD-10-CM | POA: Diagnosis not present

## 2020-03-03 DIAGNOSIS — Z880 Allergy status to penicillin: Secondary | ICD-10-CM | POA: Insufficient documentation

## 2020-03-03 DIAGNOSIS — I6602 Occlusion and stenosis of left middle cerebral artery: Secondary | ICD-10-CM | POA: Diagnosis not present

## 2020-03-03 DIAGNOSIS — Z8673 Personal history of transient ischemic attack (TIA), and cerebral infarction without residual deficits: Secondary | ICD-10-CM | POA: Insufficient documentation

## 2020-03-03 HISTORY — PX: IR ANGIO VERTEBRAL SEL SUBCLAVIAN INNOMINATE UNI L MOD SED: IMG5364

## 2020-03-03 HISTORY — PX: IR ANGIO EXTRACRAN SEL COM CAROTID INNOMINATE UNI L MOD SED: IMG5355

## 2020-03-03 LAB — CBC
HCT: 40 % (ref 36.0–46.0)
Hemoglobin: 11.7 g/dL — ABNORMAL LOW (ref 12.0–15.0)
MCH: 25.1 pg — ABNORMAL LOW (ref 26.0–34.0)
MCHC: 29.3 g/dL — ABNORMAL LOW (ref 30.0–36.0)
MCV: 85.8 fL (ref 80.0–100.0)
Platelets: 255 10*3/uL (ref 150–400)
RBC: 4.66 MIL/uL (ref 3.87–5.11)
RDW: 17.2 % — ABNORMAL HIGH (ref 11.5–15.5)
WBC: 4.8 10*3/uL (ref 4.0–10.5)
nRBC: 0 % (ref 0.0–0.2)

## 2020-03-03 LAB — PROTIME-INR
INR: 1.1 (ref 0.8–1.2)
Prothrombin Time: 13.6 seconds (ref 11.4–15.2)

## 2020-03-03 LAB — BASIC METABOLIC PANEL
Anion gap: 11 (ref 5–15)
BUN: 11 mg/dL (ref 8–23)
CO2: 29 mmol/L (ref 22–32)
Calcium: 9.3 mg/dL (ref 8.9–10.3)
Chloride: 100 mmol/L (ref 98–111)
Creatinine, Ser: 1.09 mg/dL — ABNORMAL HIGH (ref 0.44–1.00)
GFR, Estimated: 54 mL/min — ABNORMAL LOW (ref >60)
Glucose, Bld: 127 mg/dL — ABNORMAL HIGH (ref 70–99)
Potassium: 3.6 mmol/L (ref 3.5–5.1)
Sodium: 140 mmol/L (ref 135–145)

## 2020-03-03 MED ORDER — MIDAZOLAM HCL 2 MG/2ML IJ SOLN
INTRAMUSCULAR | Status: AC
  Filled 2020-03-03: qty 2

## 2020-03-03 MED ORDER — IOHEXOL 300 MG/ML  SOLN
150.0000 mL | Freq: Once | INTRAMUSCULAR | Status: AC | PRN
  Administered 2020-03-03: 75 mL via INTRA_ARTERIAL

## 2020-03-03 MED ORDER — FENTANYL CITRATE (PF) 100 MCG/2ML IJ SOLN
INTRAMUSCULAR | Status: AC | PRN
  Administered 2020-03-03 (×2): 25 ug via INTRAVENOUS

## 2020-03-03 MED ORDER — HYDRALAZINE HCL 20 MG/ML IJ SOLN
INTRAMUSCULAR | Status: AC
  Filled 2020-03-03: qty 1

## 2020-03-03 MED ORDER — HEPARIN SODIUM (PORCINE) 1000 UNIT/ML IJ SOLN
INTRAMUSCULAR | Status: AC
  Filled 2020-03-03: qty 1

## 2020-03-03 MED ORDER — SODIUM CHLORIDE 0.9 % IV SOLN: INTRAVENOUS | Status: AC

## 2020-03-03 MED ORDER — VERAPAMIL HCL 2.5 MG/ML IV SOLN
INTRAVENOUS | Status: AC
  Filled 2020-03-03: qty 2

## 2020-03-03 MED ORDER — LIDOCAINE HCL 1 % IJ SOLN
INTRAMUSCULAR | Status: AC
  Filled 2020-03-03: qty 20

## 2020-03-03 MED ORDER — LIDOCAINE HCL (PF) 1 % IJ SOLN
INTRAMUSCULAR | Status: AC | PRN
  Administered 2020-03-03: 30 mL

## 2020-03-03 MED ORDER — HYDRALAZINE HCL 20 MG/ML IJ SOLN
INTRAMUSCULAR | Status: AC | PRN
  Administered 2020-03-03: 5 mg via INTRAVENOUS

## 2020-03-03 MED ORDER — MIDAZOLAM HCL 2 MG/2ML IJ SOLN
INTRAMUSCULAR | Status: AC | PRN
  Administered 2020-03-03: 1 mg via INTRAVENOUS

## 2020-03-03 MED ORDER — FENTANYL CITRATE (PF) 100 MCG/2ML IJ SOLN
INTRAMUSCULAR | Status: AC
  Filled 2020-03-03: qty 2

## 2020-03-03 MED ORDER — HEPARIN SODIUM (PORCINE) 1000 UNIT/ML IJ SOLN
INTRAMUSCULAR | Status: AC | PRN
  Administered 2020-03-03: 1000 [IU] via INTRAVENOUS

## 2020-03-03 MED ORDER — HEPARIN SODIUM (PORCINE) 1000 UNIT/ML IJ SOLN
INTRAMUSCULAR | Status: AC
  Filled 2020-03-03: qty 2

## 2020-03-03 MED ORDER — SODIUM CHLORIDE 0.9 % IV SOLN: Freq: Once | INTRAVENOUS | Status: DC

## 2020-03-03 NOTE — Progress Notes (Signed)
Discharge instructions reviewed with pt and her husband (via telephone) both voice understanding, 

## 2020-03-03 NOTE — H&P (Signed)
Chief Complaint: Patient was seen in consultation today for Cerebral arteriogram at the request of Dr Francesco Runner   Supervising Physician: Luanne Bras  Patient Status: [facility] - Out-pt  History of Present Illness: [name] is a 70 y.o. female   Known to [facility]; Followed by Dr Estanislado Pandy Hx [facility] Anterior communicating artery aneurysm embolization 2013; with stable neck remnant 01/25/19 arteriogram:  IMPRESSION: Stable endovascularly obliterated anterior communicating artery region aneurysm, with a stable appearing approximately 2.6 mm x 2.1 mm neck remnant. No significant change is seen in the coil mass of the previously treated anterior communicating artery region aneurysm. Approximately 50% stenosis of the mid basilar artery.  CTA performed in follow up 02/20/20: IMPRESSION: 1. Stable sequelae of Anterior Communicating Artery aneurysm embolization. Subtle 2 mm residual aneurysm neck is stable since 2017. No intracranial aneurysm. 2. Moderate irregularity and stenosis of the mid Basilar Artery appears increased since 2017, stable since the DSA last year with estimated 50% stenosis at that time. 3. Chronic moderate to severe stenosis of the Right PCA P2 segment also suspected, although appearance might be exacerbated by coil pack artifact. 4. Stable ICA siphon calcified plaque without stenosis. Stable extracranial artery tortuosity without plaque or stenosis. 5.  Stable non contrast CT appearance of the brain.  Was seen in consultation with Dr Estanislado Pandy 02/24/20 for review of CTA findings ASSESSMENT AND PLAN: The findings on the CT angiogram of the head and neck were reviewed with the patient. 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 Patient also informed the small 2 mm neck remnant to be stable. However, for more accurate assessment of  the proximal basilar artery progressive stenosis, a formal catheter arteriogram would be most suitable. Other option would be to continue with CT angiogram of the head and neck in 6 months time  She continues to deny any Neurologic symptoms Denies dizzy; weakness Change in vision or speech Denies headaches  Scheduled today for cerebral arteriogram   Past Medical History:  Diagnosis Date  . Attention deficit disorder of childhood with hyperactivity   . Depression   . Esophageal reflux   . Essential hypertension, benign   . PE (pulmonary embolism)    Status post IVC placement  . Recurrent UTI   . SAH (subarachnoid hemorrhage) ([place]) 08/16/11   Coiling of ACA aneurysm, VP shunt  . Seizure disorder ([place])   . Stroke ([place])   . Type 2 diabetes mellitus ([place])     Past Surgical History:  Procedure Laterality Date  . ABDOMINAL HYSTERECTOMY    . ANEURYSM COILING  08/16/11  . IR ANGIO INTRA EXTRACRAN SEL COM CAROTID INNOMINATE BILAT MOD SED  12/16/2016  . IR ANGIO INTRA EXTRACRAN SEL COM CAROTID INNOMINATE BILAT MOD SED  01/25/2019  . IR ANGIO VERTEBRAL SEL SUBCLAVIAN INNOMINATE BILAT MOD SED  12/16/2016  . IR ANGIO VERTEBRAL SEL SUBCLAVIAN INNOMINATE UNI L MOD SED  01/25/2019  . IR RADIOLOGIST EVAL & MGMT  02/25/2020  . VENTRICULOPERITONEAL SHUNT  09/02/2011   Procedure: SHUNT INSERTION VENTRICULAR-PERITONEAL;  Surgeon: Erline Levine, MD;  Location: [place] [facility]UR[facility] OR[facility];  Service: Neurosurgery;  Laterality: Right;    Allergies: Penicillins  Medications: Prior to Admission medications   Medication Sig Start Date End Date Taking? Authorizing Provider  allopurinol (ZYLOPRIM) 300 MG tablet Take 150 mg by mouth daily.    Yes [provider]  amLODipine (NORVASC) 10  MG tablet Take 1 tablet (10 mg total) by mouth daily. 02/12/12  Yes Dhungel, Nishant, MD  citalopram (CELEXA) 20 MG tablet Take 10 mg by mouth daily.    Yes [provider]  DM-Doxylamine-Acetaminophen (COLD & FLU  NIGHTTIME) 15-6.25-325 MG/15ML LIQD Take 30 mLs by mouth at bedtime as needed (cough).   Yes [provider]  ferrous sulfate 325 (65 FE) MG tablet Take 325 mg daily with breakfast by mouth.   Yes [provider]  furosemide (LASIX) 40 MG tablet Take 40 mg by mouth daily.   Yes [provider]  hydrALAZINE (APRESOLINE) 25 MG tablet Take 25 mg by mouth 2 (two) times daily. Noon & bedtime   Yes [provider]  labetalol (NORMODYNE) 200 MG tablet Take 400 mg by mouth 2 (two) times daily.   Yes [provider]  levETIRAcetam (KEPPRA) 500 MG tablet Take 1 tablet (500 mg total) by mouth 2 (two) times daily. 02/23/12  Yes Meredith Staggers, MD  pantoprazole (PROTONIX) 40 MG tablet Take 40 mg by mouth daily.   Yes [provider]  potassium chloride SA (K-DUR,KLOR-CON) 20 MEQ tablet Take 20 mEq by mouth daily.    Yes [provider]  Phenylephrine-DM-GG-APAP (COLD & FLU SEVERE DAYTIME) 5-10-200-325 MG/15ML LIQD Take 30 mLs by mouth daily as needed (cough).    [provider]     Family History  Problem Relation Age of Onset  . Hypertension Other     Social History   Socioeconomic History  . Marital status: Married    Spouse name: Not on file  . Number of children: Not on file  . Years of education: Not on file  . Highest education level: Not on file  Occupational History  . Occupation: Disability    Comment: Retired from [employer] and [employer]  . Smoking status: Never Smoker  . Smokeless tobacco: Never Used  Vaping Use  . Vaping Use: Never used  Substance and Sexual Activity  . Alcohol use: No  . Drug use: No  . Sexual activity: Not on file  Other Topics Concern  . Not on file  Social History Narrative  . Not on file   Social Determinants of Health   Financial Resource Strain: Not on file  Food Insecurity: Not on file  Transportation Needs: Not on file  Physical Activity: Not on file  Stress: Not  on file  Social Connections: Not on file     Review of Systems: A 12 point ROS discussed and pertinent positives are indicated in the HPI above.  All other systems are negative.  Review of Systems  Constitutional: Negative for activity change, fatigue, fever and unexpected weight change.  HENT: Negative for tinnitus, trouble swallowing and voice change.   Eyes: Negative for visual disturbance.  Respiratory: Negative for cough and shortness of breath.   Cardiovascular: Negative for chest pain.  Gastrointestinal: Negative for abdominal pain, nausea and vomiting.  Musculoskeletal: Negative for gait problem.  Neurological: Negative for dizziness, tremors, seizures, syncope, facial asymmetry, speech difficulty, weakness, light-headedness, numbness and headaches.  Psychiatric/Behavioral: Negative for behavioral problems and confusion.    Vital Signs: BP (!) 161/79   Pulse 63   Temp 98 F (36.7 C) (Oral)   Resp 18   Ht 5\' 5"  (1.651 m)   Wt 265 lb (120.2 kg)   SpO2 95%   BMI 44.10 kg/m   Physical Exam Vitals reviewed.  HENT:     Mouth/Throat:  Mouth: Mucous membranes are moist.  Eyes:     Extraocular Movements: Extraocular movements intact.  Cardiovascular:     Rate and Rhythm: Normal rate and regular rhythm.     Heart sounds: Normal heart sounds.  Pulmonary:     Effort: Pulmonary effort is normal.     Breath sounds: Normal breath sounds.  Abdominal:     General: Bowel sounds are normal.     Tenderness: There is no abdominal tenderness.  Musculoskeletal:        General: Normal range of motion.     Right lower leg: No edema.     Left lower leg: No edema.  Skin:    General: Skin is warm.     Coloration: Skin is not jaundiced.  Neurological:     Mental Status: She is alert and oriented to person, place, and time.  Psychiatric:        Behavior: Behavior normal.     Imaging: CT ANGIO HEAD W OR WO CONTRAST  Result Date: 02/20/2020 CLINICAL DATA:  70 year old  female with a history of ruptured anterior communicating artery aneurysm treated with coil embolization in 2015. 2-3 mm aneurysm neck remnant, most recently re-evaluated with DSA in January 2021. A 50% stenosis mid basilar artery. Subsequent encounter. EXAM: CT ANGIOGRAPHY HEAD AND NECK TECHNIQUE: Multidetector CT imaging of the head and neck was performed using the standard protocol during bolus administration of intravenous contrast. Multiplanar CT image reconstructions and MIPs were obtained to evaluate the vascular anatomy. Carotid stenosis measurements (when applicable) are obtained utilizing NASCET criteria, using the distal internal carotid diameter as the denominator. CONTRAST:  132mL OMNIPAQUE IOHEXOL 350 MG/ML SOLN COMPARISON:  DSA 01/25/2019. CTA head and neck 08/31/2015 and earlier. FINDINGS: CT HEAD Brain: Stable right superior frontal approach ventriculostomy catheter terminating just across midline to the left at the 3rd ventricle, medial thalamus. No ventriculomegaly. Stable ventricle size and configuration. Stable patchy encephalomalacia in the left anterior frontal lobe and superior frontal gyrus, right inferior frontal gyrus. Stable cerebral volume. Stable gray-white matter differentiation throughout the brain, with otherwise patchy bilateral white matter hypodensity. Mild embolization coil pack streak artifact as before. No midline shift, ventriculomegaly, mass effect, evidence of mass lesion, intracranial hemorrhage or evidence of cortically based acute infarction. Calvarium and skull base: No acute osseous abnormality identified. Paranasal sinuses: Visualized paranasal sinuses and mastoids are stable and well pneumatized. Orbits: Stable orbit and scalp soft tissues, including right superior convexity shunt reservoir and visible right side subcutaneous shunt tubing. CTA NECK Skeleton: Absent maxillary and posterior mandible dentition as before. Chronic cervical spine disc and endplate  degeneration. Stable visualized osseous structures. Upper chest: Stable, negative. Other neck: Stable, negative. Aortic arch: 3 vessel arch configuration but unusual tortuosity of the proximal left subclavian artery which has a medial origin and course from the arch as below. No significant arch atherosclerosis. Right carotid system: Stable tortuous proximal right CCA. Negative right carotid bifurcation. Stable tortuous but otherwise negative cervical right ICA. Left carotid system: Stable tortuosity of the proximal left CCA, cervical left ICA just below the skull base. Negative left carotid bifurcation. No atherosclerosis or stenosis. Vertebral arteries: Mildly tortuous proximal right subclavian artery is stable. Mild calcified plaque near the right vertebral artery origin, but no definite origin stenosis. Right vertebral remains patent to the skull base with V2 segment tortuosity. No right vertebral plaque or stenosis identified. Stable unusual tortuosity of the proximal left subclavian artery with minimal plaque and no stenosis. Left vertebral artery origin is  mildly tortuous but otherwise negative. Left vertebral remains patent to the skull base with mild tortuosity but no stenosis identified. CTA HEAD Posterior circulation: Mildly dominant left V4 segment as before. Mild calcified left V4 segment plaque. No significant distal vertebral artery stenosis. Patent vertebrobasilar junction, right PICA, dominant appearing left AICA origin. Patent basilar artery with somewhat long segment irregularity and stenosis in the mid basilar including the AICA origins. 50% stenosis here by DSA last year, stable CTA appearance today (series 15, image 22) with progressed irregularity and stenosis compared to 2017. Patent SCA and left PCA origins. Fetal type right PCA redemonstrated. Coil pack streak artifact mildly affects the bilateral PCA branches. Mild left PCA branch irregularity appears stable, as does moderate to severe  right PCA P2 segment irregularity and stenosis best seen on series 16, image 18. Anterior circulation: Both ICA siphons are patent. Moderate calcified plaque on the left with limited detail at the anterior genu due to coil pack streak artifact. No significant stenosis of the left siphon last year. Similar moderate calcified right siphon plaque with no significant stenosis. Patent carotid termini, MCA and ACA origins. Left MCA M1 segment and bifurcation are patent without stenosis. Right MCA M1 segment and bifurcation appear patent without stenosis when allowing for some coil pack artifact. Bilateral MCA branches appear stable and within normal limits. Mildly dominant left ACA A1 segment again noted. No ACA stenosis identified. Bilateral ACA branches appear within normal limits. Anterior communicating artery coil pack appears stable in configuration. Subtle 2 mm area of residual aneurysm neck enhancement on series 12 image 128 is stable since 2017. No other discrete aneurysm residual by CTA. Median artery of the corpus callosum remains patent and is stable. Venous sinuses: Patent. Left transverse and sigmoid sinuses appear dominant as before. Anatomic variants: Fetal type right PCA origin. Review of the MIP images confirms the above findings IMPRESSION: 1. Stable sequelae of Anterior Communicating Artery aneurysm embolization. Subtle 2 mm residual aneurysm neck is stable since 2017. No intracranial aneurysm. 2. Moderate irregularity and stenosis of the mid Basilar Artery appears increased since 2017, stable since the DSA last year with estimated 50% stenosis at that time. 3. Chronic moderate to severe stenosis of the Right PCA P2 segment also suspected, although appearance might be exacerbated by coil pack artifact. 4. Stable ICA siphon calcified plaque without stenosis. Stable extracranial artery tortuosity without plaque or stenosis. 5.  Stable non contrast CT appearance of the brain. Electronically Signed   By: Genevie Ann M.D.   On: 02/20/2020 11:32   CT ANGIO NECK W OR WO CONTRAST  Result Date: 02/20/2020 CLINICAL DATA:  70 year old female with a history of ruptured anterior communicating artery aneurysm treated with coil embolization in 2015. 2-3 mm aneurysm neck remnant, most recently re-evaluated with DSA in January 2021. A 50% stenosis mid basilar artery. Subsequent encounter. EXAM: CT ANGIOGRAPHY HEAD AND NECK TECHNIQUE: Multidetector CT imaging of the head and neck was performed using the standard protocol during bolus administration of intravenous contrast. Multiplanar CT image reconstructions and MIPs were obtained to evaluate the vascular anatomy. Carotid stenosis measurements (when applicable) are obtained utilizing NASCET criteria, using the distal internal carotid diameter as the denominator. CONTRAST:  19mL OMNIPAQUE IOHEXOL 350 MG/ML SOLN COMPARISON:  DSA 01/25/2019. CTA head and neck 08/31/2015 and earlier. FINDINGS: CT HEAD Brain: Stable right superior frontal approach ventriculostomy catheter terminating just across midline to the left at the 3rd ventricle, medial thalamus. No ventriculomegaly. Stable ventricle size and configuration. Stable  patchy encephalomalacia in the left anterior frontal lobe and superior frontal gyrus, right inferior frontal gyrus. Stable cerebral volume. Stable gray-white matter differentiation throughout the brain, with otherwise patchy bilateral white matter hypodensity. Mild embolization coil pack streak artifact as before. No midline shift, ventriculomegaly, mass effect, evidence of mass lesion, intracranial hemorrhage or evidence of cortically based acute infarction. Calvarium and skull base: No acute osseous abnormality identified. Paranasal sinuses: Visualized paranasal sinuses and mastoids are stable and well pneumatized. Orbits: Stable orbit and scalp soft tissues, including right superior convexity shunt reservoir and visible right side subcutaneous shunt tubing. CTA NECK  Skeleton: Absent maxillary and posterior mandible dentition as before. Chronic cervical spine disc and endplate degeneration. Stable visualized osseous structures. Upper chest: Stable, negative. Other neck: Stable, negative. Aortic arch: 3 vessel arch configuration but unusual tortuosity of the proximal left subclavian artery which has a medial origin and course from the arch as below. No significant arch atherosclerosis. Right carotid system: Stable tortuous proximal right CCA. Negative right carotid bifurcation. Stable tortuous but otherwise negative cervical right ICA. Left carotid system: Stable tortuosity of the proximal left CCA, cervical left ICA just below the skull base. Negative left carotid bifurcation. No atherosclerosis or stenosis. Vertebral arteries: Mildly tortuous proximal right subclavian artery is stable. Mild calcified plaque near the right vertebral artery origin, but no definite origin stenosis. Right vertebral remains patent to the skull base with V2 segment tortuosity. No right vertebral plaque or stenosis identified. Stable unusual tortuosity of the proximal left subclavian artery with minimal plaque and no stenosis. Left vertebral artery origin is mildly tortuous but otherwise negative. Left vertebral remains patent to the skull base with mild tortuosity but no stenosis identified. CTA HEAD Posterior circulation: Mildly dominant left V4 segment as before. Mild calcified left V4 segment plaque. No significant distal vertebral artery stenosis. Patent vertebrobasilar junction, right PICA, dominant appearing left AICA origin. Patent basilar artery with somewhat long segment irregularity and stenosis in the mid basilar including the AICA origins. 50% stenosis here by DSA last year, stable CTA appearance today (series 15, image 22) with progressed irregularity and stenosis compared to 2017. Patent SCA and left PCA origins. Fetal type right PCA redemonstrated. Coil pack streak artifact mildly  affects the bilateral PCA branches. Mild left PCA branch irregularity appears stable, as does moderate to severe right PCA P2 segment irregularity and stenosis best seen on series 16, image 18. Anterior circulation: Both ICA siphons are patent. Moderate calcified plaque on the left with limited detail at the anterior genu due to coil pack streak artifact. No significant stenosis of the left siphon last year. Similar moderate calcified right siphon plaque with no significant stenosis. Patent carotid termini, MCA and ACA origins. Left MCA M1 segment and bifurcation are patent without stenosis. Right MCA M1 segment and bifurcation appear patent without stenosis when allowing for some coil pack artifact. Bilateral MCA branches appear stable and within normal limits. Mildly dominant left ACA A1 segment again noted. No ACA stenosis identified. Bilateral ACA branches appear within normal limits. Anterior communicating artery coil pack appears stable in configuration. Subtle 2 mm area of residual aneurysm neck enhancement on series 12 image 128 is stable since 2017. No other discrete aneurysm residual by CTA. Median artery of the corpus callosum remains patent and is stable. Venous sinuses: Patent. Left transverse and sigmoid sinuses appear dominant as before. Anatomic variants: Fetal type right PCA origin. Review of the MIP images confirms the above findings IMPRESSION: 1. Stable sequelae of Anterior  Communicating Artery aneurysm embolization. Subtle 2 mm residual aneurysm neck is stable since 2017. No intracranial aneurysm. 2. Moderate irregularity and stenosis of the mid Basilar Artery appears increased since 2017, stable since the DSA last year with estimated 50% stenosis at that time. 3. Chronic moderate to severe stenosis of the Right PCA P2 segment also suspected, although appearance might be exacerbated by coil pack artifact. 4. Stable ICA siphon calcified plaque without stenosis. Stable extracranial artery  tortuosity without plaque or stenosis. 5.  Stable non contrast CT appearance of the brain. Electronically Signed   By: Genevie Ann M.D.   On: 02/20/2020 11:32   IR Radiologist Eval & Mgmt  Result Date: 02/25/2020 EXAM: ESTABLISHED PATIENT OFFICE VISIT CHIEF COMPLAINT: No significant complaints. Current Pain Level: 1-10 HISTORY OF PRESENT ILLNESS: 70 year old right handed lady who presents to discuss results of recent CT angiogram of the head and neck of February 20, 2020. The patient underwent uneventful endovascular treatment with coiling of a ruptured right anterior communicating artery region aneurysm in August of 2013. She subsequently has been followed-up with CT angiogram of the head and neck and initially with diagnostic catheter arteriograms in order to evaluate the stability of the treated anterior communicating artery region aneurysm. Her most recent follow-up was with a CT angiogram of the head and neck February 20, 2020. This revealed a 2 mm neck remnant of the previously treated anterior communicating artery region aneurysm without any coil compaction. Also demonstrated was severe right posterior cerebral artery proximal P2 stenosis. Also demonstrated is probably worsening of the proximal basilar artery stenosis. Clinically, the patient reports no symptoms of vertigo, dizziness, loss of consciousness, or seizure-like activity. She denies any bilateral pulsatile tinnitus, hearing difficulties, difficulty swallowing or chewing food, perioral numbness, or of blindness, blurring or of amaurosis fugax or double vision. She reports her station and gait to be normal without any issues with balance. Diagnosis * : Date . * : Attention deficit disorder of childhood with hyperactivity * : . * : Depression * : . * : Esophageal reflux * : . * : Essential hypertension, benign * : . * : PE (pulmonary embolism) * : * : Status post IVC placement . * : Recurrent UTI * : . * : SAH (subarachnoid hemorrhage) ([place]) * :  08/16/11 * : Coiling of ACA aneurysm, VP shunt . * : Seizure disorder ([place]) * : . * : Stroke ([place]) * : . * : Type 2 diabetes mellitus ([place]) * : Past Surgical History: Procedure * : Laterality * : Date . * : ABDOMINAL HYSTERECTOMY * : * : . * : ANEURYSM COILING * : * : 08/16/11 . * : IR ANGIO INTRA EXTRACRAN SEL COM CAROTID INNOMINATE BILAT MOD SED * : * : 12/16/2016 . * : IR ANGIO VERTEBRAL SEL SUBCLAVIAN INNOMINATE BILAT MOD SED * : * : 12/16/2016 . * : VENTRICULOPERITONEAL SHUNT * : * : 09/02/2011 * : Procedure: SHUNT INSERTION VENTRICULAR-PERITONEAL; Surgeon: Erline Levine, MD; Location: [place] [facility]; Service: Neurosurgery; Laterality: Right; Allergies:Penicillins Medications: Prior to Admission medications Medication * : Sig * : Start Date * : End Date * : Taking? * : Authorizing Provider allopurinol (ZYLOPRIM) 300 MG tablet * : Take 150 mg by mouth daily. * : * : * : Yes * : [provider] amLODipine (NORVASC) 10 MG tablet * : Take 1 tablet (10 mg total) by mouth daily. * : 02/12/12 * : * : Yes * : Dhungel, Nishant,  MD citalopram (CELEXA) 20 MG tablet * : Take 10 mg by mouth daily. * : * : * : Yes * : [provider] ferrous sulfate 325 (65 FE) MG tablet * : Take 325 mg daily with breakfast by mouth. * : * : * : Yes * : [provider] furosemide (LASIX) 40 MG tablet * : Take 40 mg by mouth daily. * : * : * : Yes * : [provider] hydrALAZINE (APRESOLINE) 25 MG tablet * : Take 25 mg by mouth 2 (two) times daily. Noon & bedtime * : * : * : Yes * : [provider] labetalol (NORMODYNE) 200 MG tablet * : Take 400 mg by mouth 2 (two) times daily. * : * : * : Yes * : [provider] levETIRAcetam (KEPPRA) 500 MG tablet * : Take 1 tablet (500 mg total) by mouth 2 (two) times daily. * : 02/23/12 * : * : Yes * : Meredith Staggers, MD pantoprazole (PROTONIX) 40 MG tablet * : Take 40 mg by mouth daily. * : * : * : Yes * : [provider] potassium chloride SA  (K-DUR,KLOR-CON) 20 MEQ tablet * : Take 20 mEq by mouth daily. * : * : * : Yes * : [provider] Family History Problem * : Relation * : Age of Onset . * : Hypertension * : Other * : Socioeconomic History . * : Marital status: * : Married * : * : Spouse name: * : Not on file . * : Number of children: * : Not on file . * : Years of education: * : Not on file . * : Highest education level: * : Not on file Occupational History . * : Occupation: * : Disability * : * : Comment: Retired from [employer] Use . * : Smoking status: * : Never Smoker . * : Smokeless tobacco: * : Never Used Substance and Sexual Activity . * : Alcohol use: * : No . * : Drug use: * : No . * : Sexual activity: * : Not on file Other Topics * : Concern . * : Not on file Social History Narrative . * : Not on file Financial Resource Strain: . * : Difficulty of Paying Living Expenses: Not on file Food Insecurity: . * : Worried About Charity fundraiser in the Last Year: Not on file . * : Ran Out of Food in the Last Year: Not on file Transportation Needs: . * : Lack of Transportation (Medical): Not on file . * : Lack of Transportation (Non-Medical): Not on file Physical Activity: . * : Days of Exercise per Week: Not on file . * : Minutes of Exercise per Session: Not on file Stress: . * : Feeling of Stress : Not on file Social Connections: . * : Frequency of Communication with Friends and Family: Not on file . * : Frequency of Social Gatherings with Friends and Family: Not on file . * : Attends Religious Services: Not on file . * : Active Member of Clubs or Organizations: Not on file . * : Attends Archivist Meetings: Not on file . * : Marital Status: Not on file REVIEW OF SYSTEMS: Essentially negative for pathological symptomatology to the cardiovascular, respiratory, GI GU and musculoskeletal systems. PHYSICAL EXAMINATION: Grossly patient appears in no acute distress. Affect and eye contact within normal limits.  Speech and comprehension normal. No gross  abnormal lateralizing cranial nerve abnormalities noted. Station and gait: Patient able to ambulate without difficulty. ASSESSMENT AND PLAN: The findings on the CT angiogram of the head and neck were reviewed with the patient. 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 Patient also informed the small 2 mm neck remnant to be stable. However, for more accurate assessment of the proximal basilar artery progressive stenosis, a formal catheter arteriogram would be most suitable. Other option would be to continue with CT angiogram of the head and neck in 6 months time. Patient is aware of the procedure of diagnostic catheter arteriogram having had these previously. Meanwhile, the patient has been advised to maintain adequate hydration and continue with strict medical management. Should she develop any stroke-like symptoms, patient advised to call 911. Patient expressed understanding and agreement with the above management plan. Electronically Signed   By: Luanne Bras M.D.   On: 02/24/2020 14:03    Labs:  CBC: No results for input(s): WBC, HGB, HCT, PLT in the last 8760 hours.  COAGS: No results for input(s): INR, APTT in the last 8760 hours.  BMP: Recent Labs    02/20/20 0826  CREATININE 1.00    LIVER FUNCTION TESTS: No results for input(s): BILITOT, AST, ALT, ALKPHOS, PROT, ALBUMIN in the last 8760 hours.  TUMOR MARKERS: No results for input(s): AFPTM, CEA, CA199, CHROMGRNA in the last 8760 hours.  Assessment and Plan:  Hx ACOM aneurysm coiling 2013; known stable neck remnant Follow up CTA 01/2020 revealing  Moderate irregularity and stenosis of the mid Basilar Artery appears increased since 2017, stable since the DSA last year with estimated 50% stenosis at that time. Chronic moderate to severe stenosis of the Right  PCA P2 segment also suspected, although appearance might be exacerbated by coil pack artifact. Scheduled now for cerebral arteriogram to fully delineate findings Risks and benefits of cerebral angiogram with intervention were discussed with the patient including, but not limited to bleeding, infection, vascular injury, contrast induced renal failure, stroke or even death.  This interventional procedure involves the use of X-rays and because of the nature of the planned procedure, it is possible that we will have prolonged use of X-ray fluoroscopy.  Potential radiation risks to you include (but are not limited to) the following: - A slightly elevated risk for cancer  several years later in life. This risk is typically less than 0.5% percent. This risk is low in comparison to the normal incidence of human cancer, which is 33% for women and 50% for men according to the [place]. - Radiation induced injury can include skin redness, resembling a rash, tissue breakdown / ulcers and hair loss (which can be temporary or permanent).   The likelihood of either of these occurring depends on the difficulty of the procedure and whether you are sensitive to radiation due to previous procedures, disease, or genetic conditions.   IF your procedure requires a prolonged use of radiation, you will be notified and given written instructions for further action.  It is your responsibility to monitor the irradiated area for the 2 weeks following the procedure and to notify your physician if you are concerned that you have suffered a radiation induced injury.    All of the patient's questions were answered, patient is agreeable to proceed.  Consent signed and in chart.  Thank you for this interesting consult.  I greatly enjoyed  meeting [name] and look forward to participating in their care.  A copy of this report was sent to the requesting provider on this date.  Electronically  Signed: Lavonia Drafts, PA-C 03/03/2020, 7:21 AM   I spent a total of    25 Minutes in face to face in clinical consultation, greater than 50% of which was counseling/coordinating care for cerebral arteriogram

## 2020-03-03 NOTE — Procedures (Signed)
S/P RTCCA and RT [facility] artrriograms. RT CFA approach. Findings. 1.Approx 2.3 mm x 1.7 mm neck remnant of previously treated ACOM aneurysm stable. 2.Approx 75 % stenosis mid basilar artery. S.Soledad Budreau MD

## 2020-03-03 NOTE — Discharge Instructions (Addendum)
Femoral Site Care  This sheet gives you information about how to care for yourself after your procedure. Your health care provider may also give you more specific instructions. If you have problems or questions, contact your health care provider. What can I expect after the procedure? After the procedure, it is common to have:  Bruising that usually fades within 1-2 weeks.  Tenderness at the site. Follow these instructions at home: Wound care  Follow instructions from your health care provider about how to take care of your insertion site. Make sure you: ? Wash your hands with soap and water before you change your bandage (dressing). If soap and water are not available, use hand sanitizer. ? Change your dressing as told by your health care provider. ? Leave stitches (sutures), skin glue, or adhesive strips in place. These skin closures may need to stay in place for 2 weeks or longer. If adhesive strip edges start to loosen and curl up, you may trim the loose edges. Do not remove adhesive strips completely unless your health care provider tells you to do that.  Do not take baths, swim, or use a hot tub until your health care provider approves.  You may shower 24-48 hours after the procedure or as told by your health care provider. ? Gently wash the site with plain soap and water. ? Pat the area dry with a clean towel. ? Do not rub the site. This may cause bleeding.  Do not apply powder or lotion to the site. Keep the site clean and dry.  Check your femoral site every day for signs of infection. Check for: ? Redness, swelling, or pain. ? Fluid or blood. ? Warmth. ? Pus or a bad smell. Activity  For the first 2-3 days after your procedure, or as long as directed: ? Avoid climbing stairs as much as possible. ? Do not squat.  Do not lift anything that is heavier than 10 lb (4.5 kg), or the limit that you are told, until your health care provider says that it is safe.  Rest as  directed. ? Avoid sitting for a long time without moving. Get up to take short walks every 1-2 hours.  Do not drive for 24 hours if you were given a medicine to help you relax (sedative). General instructions  Take over-the-counter and prescription medicines only as told by your health care provider.  Keep all follow-up visits as told by your health care provider. This is important. Contact a health care provider if you have:  A fever or chills.  You have redness, swelling, or pain around your insertion site. Get help right away if:  The catheter insertion area swells very fast.  You pass out.  You suddenly start to sweat or your skin gets clammy.  The catheter insertion area is bleeding, and the bleeding does not stop when you hold steady pressure on the area.  The area near or just beyond the catheter insertion site becomes pale, cool, tingly, or numb. These symptoms may represent a serious problem that is an emergency. Do not wait to see if the symptoms will go away. Get medical help right away. Call your local emergency services (911 in the [place] Do not drive yourself to the hospital. Summary  After the procedure, it is common to have bruising that usually fades within 1-2 weeks.  Check your femoral site every day for signs of infection.  Do not lift anything that is heavier than 10 lb (4.5 kg), or  the limit that you are told, until your health care provider says that it is safe. This information is not intended to replace advice given to you by your health care provider. Make sure you discuss any questions you have with your health care provider. Document Revised: 08/23/2019 Document Reviewed: 08/23/2019 Elsevier Patient Education  [place].  Cerebral Angiogram, Care After This sheet gives you information about how to care for yourself after your procedure. Your health care provider may also give you more specific instructions. If you have problems or questions,  contact your health care provider. What can I expect after the procedure? After the procedure, it is common to have:  Bruising and tenderness at the catheter insertion site.  A mild headache. Follow these instructions at home: Insertion site care  Follow instructions from your health care provider about how to take care of the insertion site. Make sure you: ? Wash your hands with soap and water before and after you change your bandage (dressing). If soap and water are not available, use hand sanitizer. ? Change your dressing as told by your health care provider.  Do not take baths, swim, or use a hot tub until your health care provider approves. You may shower 24-48 hours after the procedure, or as told by your health care provider.  To clean your insertion site: ? Gently wash the site with plain soap and water. ? Pat the area dry with a clean towel. ? Do not rub the site. This may cause bleeding.  Do not apply powder or lotion to the site. Keep the site clean and dry. Infection signs Check your incision area every day for signs of infection. Check for:  Redness, swelling, or pain.  Fluid or blood.  Warmth.  Pus or a bad smell.   Activity  Do not drive for 24 hours if you were given a sedative during your procedure.  Rest as told by your health care provider.  Do not lift anything that is heavier than 10 lb (4.5 kg), or the limit that you are told, until your health care provider says that it is safe.  Return to your normal activities as told by your health care provider, usually in about a week. Ask your health care provider what activities are safe for you. General instructions  If your insertion site starts to bleed, lie flat and put pressure on the site. If the bleeding does not stop, get help right away. This is a medical emergency.  Do not use any products that contain nicotine or tobacco, such as cigarettes, e-cigarettes, and chewing tobacco. If you need help  quitting, ask your health care provider.  Take over-the-counter and prescription medicines only as told by your health care provider.  Drink enough fluid to keep your urine pale yellow. This helps flush the contrast dye from your body.  Keep all follow-up visits as directed by your health care provider. This is important.   Contact a health care provider if:  You have a fever or chills.  You have redness, swelling, or pain around your insertion site.  You have fluid or blood coming from your insertion site.  The insertion site feels warm to the touch.  You have pus or a bad smell coming from your insertion site.  You notice blood collecting in the tissue around the insertion site (hematoma). The hematoma may be painful to the touch. Get help right away if:  You have chest pain or trouble breathing.  You  have severe pain or swelling at the insertion site.  The insertion area bleeds, and bleeding continues after 30 minutes of holding steady pressure on the site.  The arm or leg where the catheter was inserted is pale, cold, numb, tingling, or weak.  You have a rash.  You have any symptoms of a stroke. "BE FAST" is an easy way to remember the main warning signs of a stroke: ? B - Balance. Signs are dizziness, sudden trouble walking, or loss of balance. ? E - Eyes. Signs are trouble seeing or a sudden change in vision. ? F - Face. Signs are sudden weakness or numbness of the face, or the face or eyelid drooping on one side. ? A - Arms. Signs are weakness or numbness in an arm. This happens suddenly and usually on one side of the body. ? S - Speech. Signs are sudden trouble speaking, slurred speech, or trouble understanding what people say. ? T - Time. Time to call emergency services. Write down what time symptoms started.  You have other signs of a stroke, such as: ? A sudden, severe headache with no known cause. ? Nausea or vomiting. ? Seizure. These symptoms may represent a  serious problem that is an emergency. Do not wait to see if the symptoms will go away. Get medical help right away. Call your local emergency services (911 in the [place] Do not drive yourself to the hospital. Summary  Bruising and tenderness at the insertion site are common.  Follow your health care provider's instructions about caring for your insertion site. Change dressing and clean the area as instructed.  If your insertion site bleeds, apply direct pressure until bleeding stops.  Return to your normal activities as told by your health care provider. Ask what activities are safe.  Rest and drink plenty of fluids. This information is not intended to replace advice given to you by your health care provider. Make sure you discuss any questions you have with your health care provider. Document Revised: 07/10/2018 Document Reviewed: 07/10/2018 Elsevier Patient Education  2021 [place]. Moderate Conscious Sedation, Adult Sedation is the use of medicines to promote relaxation and to relieve discomfort and anxiety. Moderate conscious sedation is a type of sedation. Under moderate conscious sedation, you are less alert than normal, but you are still able to respond to instructions, touch, or both. Moderate conscious sedation is used during short medical and dental procedures. It is milder than deep sedation, which is a type of sedation under which you cannot be easily woken up. It is also milder than general anesthesia, which is the use of medicines to make you unconscious. Moderate conscious sedation allows you to return to your regular activities sooner. Tell a health care provider about:  Any allergies you have.  All medicines you are taking, including vitamins, herbs, eye drops, creams, and over-the-counter medicines.  Any use of steroids. This includes steroids taken by mouth or as a cream.  Any problems you or family members have had with sedatives and anesthetic medicines.  Any  blood disorders you have.  Any surgeries you have had.  Any medical conditions you have, such as sleep apnea.  Whether you are pregnant or may be pregnant.  Any use of cigarettes, alcohol, marijuana, or drugs. What are the risks? Generally, this is a safe procedure. However, problems may occur, including:  Getting too much medicine (oversedation).  Nausea.  Allergic reaction to medicines.  Trouble breathing. If this happens, a breathing tube may  be used. It will be removed when you are awake and breathing on your own.  Heart trouble.  Lung trouble.  Confusion that gets better with time (emergence delirium). What happens before the procedure? Staying hydrated Follow instructions from your health care provider about hydration, which may include:  Up to 2 hours before the procedure - you may continue to drink clear liquids, such as water, clear fruit juice, black coffee, and plain tea. Eating and drinking restrictions Follow instructions from your health care provider about eating and drinking, which may include:  8 hours before the procedure - stop eating heavy meals or foods, such as meat, fried foods, or fatty foods.  6 hours before the procedure - stop eating light meals or foods, such as toast or cereal.  6 hours before the procedure - stop drinking milk or drinks that contain milk.  2 hours before the procedure - stop drinking clear liquids. Medicines Ask your health care provider about:  Changing or stopping your regular medicines. This is especially important if you are taking diabetes medicines or blood thinners.  Taking medicines such as aspirin and ibuprofen. These medicines can thin your blood. Do not take these medicines unless your health care provider tells you to take them.  Taking over-the-counter medicines, vitamins, herbs, and supplements. Tests and exams  You will have a physical exam.  You may have blood tests done to show how well: ? Your  kidneys and liver work. ? Your blood clots. General instructions  Plan to have a responsible adult take you home from the hospital or clinic.  If you will be going home right after the procedure, plan to have a responsible adult care for you for the time you are told. This is important. What happens during the procedure?  You will be given the sedative. The sedative may be given: ? As a pill that you will swallow. It can also be inserted into the rectum. ? As a spray through the nose. ? As an injection into the muscle. ? As an injection into the vein through an IV.  You may be given oxygen as needed.  Your breathing, heart rate, and blood pressure will be monitored during the procedure.  The medical or dental procedure will be done. The procedure may vary among health care providers and hospitals.   What happens after the procedure?  Your blood pressure, heart rate, breathing rate, and blood oxygen level will be monitored until you leave the hospital or clinic.  You will get fluids through your IV if needed.  Do not drive or operate machinery until your health care provider says that it is safe. Summary  Sedation is the use of medicines to promote relaxation and to relieve discomfort and anxiety. Moderate conscious sedation is a type of sedation that is used during short medical and dental procedures.  Tell the health care provider about any medical conditions that you have and about all the medicines that you are taking.  You will be given the sedative as a pill, a spray through the nose, an injection into the muscle, or an injection into the vein through an IV. Vital signs are monitored during the sedation.  Moderate conscious sedation allows you to return to your regular activities sooner. This information is not intended to replace advice given to you by your health care provider. Make sure you discuss any questions you have with your health care provider. Document Revised:  04/19/2019 Document Reviewed: 11/15/2018 Elsevier Patient Education  [place].

## 2020-03-03 NOTE — Progress Notes (Signed)
Ambulated tol well no bleeding noted before or after ambulation.

## 2020-07-09 DIAGNOSIS — I6990 Unspecified sequelae of unspecified cerebrovascular disease: Secondary | ICD-10-CM | POA: Diagnosis not present

## 2020-07-09 DIAGNOSIS — G2581 Restless legs syndrome: Secondary | ICD-10-CM | POA: Diagnosis not present

## 2020-07-09 DIAGNOSIS — I10 Essential (primary) hypertension: Secondary | ICD-10-CM | POA: Diagnosis not present

## 2020-07-09 DIAGNOSIS — G4733 Obstructive sleep apnea (adult) (pediatric): Secondary | ICD-10-CM | POA: Diagnosis not present

## 2020-07-09 DIAGNOSIS — I6529 Occlusion and stenosis of unspecified carotid artery: Secondary | ICD-10-CM | POA: Diagnosis not present

## 2020-07-09 DIAGNOSIS — R5383 Other fatigue: Secondary | ICD-10-CM | POA: Diagnosis not present

## 2020-08-05 DIAGNOSIS — Z20822 Contact with and (suspected) exposure to covid-19: Secondary | ICD-10-CM | POA: Diagnosis not present

## 2020-08-27 DIAGNOSIS — I10 Essential (primary) hypertension: Secondary | ICD-10-CM | POA: Diagnosis not present

## 2020-08-27 DIAGNOSIS — R5383 Other fatigue: Secondary | ICD-10-CM | POA: Diagnosis not present

## 2020-08-27 DIAGNOSIS — Z1339 Encounter for screening examination for other mental health and behavioral disorders: Secondary | ICD-10-CM | POA: Diagnosis not present

## 2020-08-27 DIAGNOSIS — E7800 Pure hypercholesterolemia, unspecified: Secondary | ICD-10-CM | POA: Diagnosis not present

## 2020-08-27 DIAGNOSIS — Z299 Encounter for prophylactic measures, unspecified: Secondary | ICD-10-CM | POA: Diagnosis not present

## 2020-08-27 DIAGNOSIS — Z7189 Other specified counseling: Secondary | ICD-10-CM | POA: Diagnosis not present

## 2020-08-27 DIAGNOSIS — Z79899 Other long term (current) drug therapy: Secondary | ICD-10-CM | POA: Diagnosis not present

## 2020-08-27 DIAGNOSIS — Z1331 Encounter for screening for depression: Secondary | ICD-10-CM | POA: Diagnosis not present

## 2020-08-27 DIAGNOSIS — Z0000 Encounter for general adult medical examination without abnormal findings: Secondary | ICD-10-CM | POA: Diagnosis not present

## 2020-08-27 DIAGNOSIS — Z6841 Body Mass Index (BMI) 40.0 and over, adult: Secondary | ICD-10-CM | POA: Diagnosis not present

## 2020-09-02 DIAGNOSIS — I10 Essential (primary) hypertension: Secondary | ICD-10-CM | POA: Diagnosis not present

## 2020-09-02 DIAGNOSIS — M109 Gout, unspecified: Secondary | ICD-10-CM | POA: Diagnosis not present

## 2020-09-28 DIAGNOSIS — Z6841 Body Mass Index (BMI) 40.0 and over, adult: Secondary | ICD-10-CM | POA: Diagnosis not present

## 2020-09-28 DIAGNOSIS — I10 Essential (primary) hypertension: Secondary | ICD-10-CM | POA: Diagnosis not present

## 2020-09-28 DIAGNOSIS — Z299 Encounter for prophylactic measures, unspecified: Secondary | ICD-10-CM | POA: Diagnosis not present

## 2020-09-28 DIAGNOSIS — Z23 Encounter for immunization: Secondary | ICD-10-CM | POA: Diagnosis not present

## 2020-09-28 DIAGNOSIS — N1831 Chronic kidney disease, stage 3a: Secondary | ICD-10-CM | POA: Diagnosis not present

## 2020-09-28 DIAGNOSIS — R739 Hyperglycemia, unspecified: Secondary | ICD-10-CM | POA: Diagnosis not present

## 2020-09-30 ENCOUNTER — Other Ambulatory Visit (HOSPITAL_COMMUNITY): Payer: Self-pay | Admitting: Interventional Radiology

## 2020-09-30 DIAGNOSIS — I671 Cerebral aneurysm, nonruptured: Secondary | ICD-10-CM

## 2020-10-02 DIAGNOSIS — I10 Essential (primary) hypertension: Secondary | ICD-10-CM | POA: Diagnosis not present

## 2020-10-02 DIAGNOSIS — M109 Gout, unspecified: Secondary | ICD-10-CM | POA: Diagnosis not present

## 2020-10-12 ENCOUNTER — Ambulatory Visit (HOSPITAL_COMMUNITY)
Admission: RE | Admit: 2020-10-12 | Discharge: 2020-10-12 | Disposition: A | Discharge status: Home / Self Care | Payer: Medicare Other | Source: Ambulatory Visit | Attending: Interventional Radiology | Admitting: Interventional Radiology

## 2020-10-12 ENCOUNTER — Other Ambulatory Visit: Payer: Self-pay

## 2020-10-12 ENCOUNTER — Encounter (HOSPITAL_COMMUNITY): Payer: Self-pay

## 2020-10-12 DIAGNOSIS — I6602 Occlusion and stenosis of left middle cerebral artery: Secondary | ICD-10-CM | POA: Diagnosis not present

## 2020-10-12 DIAGNOSIS — I671 Cerebral aneurysm, nonruptured: Secondary | ICD-10-CM | POA: Diagnosis not present

## 2020-10-12 LAB — POCT I-STAT CREATININE: Creatinine, Ser: 1.1 mg/dL — ABNORMAL HIGH (ref 0.44–1.00)

## 2020-10-12 MED ORDER — IOHEXOL 350 MG/ML SOLN
60.0000 mL | Freq: Once | INTRAVENOUS | Status: AC | PRN
  Administered 2020-10-12: 60 mL via INTRAVENOUS

## 2020-10-14 ENCOUNTER — Telehealth (HOSPITAL_COMMUNITY): Payer: Self-pay

## 2020-10-14 NOTE — Telephone Encounter (Signed)
Called pt regarding recent imaging, no answer, left vm. AW  

## 2020-10-14 NOTE — Telephone Encounter (Signed)
Pt agreed to f/u in 6 months with cta head/neck. AW 

## 2020-11-02 DIAGNOSIS — I10 Essential (primary) hypertension: Secondary | ICD-10-CM | POA: Diagnosis not present

## 2020-11-02 DIAGNOSIS — M109 Gout, unspecified: Secondary | ICD-10-CM | POA: Diagnosis not present

## 2020-12-09 DIAGNOSIS — Z23 Encounter for immunization: Secondary | ICD-10-CM | POA: Diagnosis not present

## 2020-12-17 DIAGNOSIS — Z299 Encounter for prophylactic measures, unspecified: Secondary | ICD-10-CM | POA: Diagnosis not present

## 2020-12-17 DIAGNOSIS — I10 Essential (primary) hypertension: Secondary | ICD-10-CM | POA: Diagnosis not present

## 2020-12-17 DIAGNOSIS — Z6841 Body Mass Index (BMI) 40.0 and over, adult: Secondary | ICD-10-CM | POA: Diagnosis not present

## 2020-12-17 DIAGNOSIS — N183 Chronic kidney disease, stage 3 unspecified: Secondary | ICD-10-CM | POA: Diagnosis not present

## 2020-12-17 DIAGNOSIS — F32A Depression, unspecified: Secondary | ICD-10-CM | POA: Diagnosis not present

## 2021-01-05 DIAGNOSIS — Z20822 Contact with and (suspected) exposure to covid-19: Secondary | ICD-10-CM | POA: Diagnosis not present

## 2021-01-15 DIAGNOSIS — Z6841 Body Mass Index (BMI) 40.0 and over, adult: Secondary | ICD-10-CM | POA: Diagnosis not present

## 2021-01-15 DIAGNOSIS — E1165 Type 2 diabetes mellitus with hyperglycemia: Secondary | ICD-10-CM | POA: Diagnosis not present

## 2021-01-15 DIAGNOSIS — Z299 Encounter for prophylactic measures, unspecified: Secondary | ICD-10-CM | POA: Diagnosis not present

## 2021-01-15 DIAGNOSIS — N183 Chronic kidney disease, stage 3 unspecified: Secondary | ICD-10-CM | POA: Diagnosis not present

## 2021-01-15 DIAGNOSIS — I10 Essential (primary) hypertension: Secondary | ICD-10-CM | POA: Diagnosis not present

## 2021-01-22 DIAGNOSIS — Z299 Encounter for prophylactic measures, unspecified: Secondary | ICD-10-CM | POA: Diagnosis not present

## 2021-01-22 DIAGNOSIS — J329 Chronic sinusitis, unspecified: Secondary | ICD-10-CM | POA: Diagnosis not present

## 2021-01-22 DIAGNOSIS — Z6841 Body Mass Index (BMI) 40.0 and over, adult: Secondary | ICD-10-CM | POA: Diagnosis not present

## 2021-01-22 DIAGNOSIS — J069 Acute upper respiratory infection, unspecified: Secondary | ICD-10-CM | POA: Diagnosis not present

## 2021-01-22 DIAGNOSIS — I10 Essential (primary) hypertension: Secondary | ICD-10-CM | POA: Diagnosis not present

## 2021-02-18 DIAGNOSIS — Z20822 Contact with and (suspected) exposure to covid-19: Secondary | ICD-10-CM | POA: Diagnosis not present

## 2021-02-26 DIAGNOSIS — Z1231 Encounter for screening mammogram for malignant neoplasm of breast: Secondary | ICD-10-CM | POA: Diagnosis not present

## 2021-03-02 DIAGNOSIS — I10 Essential (primary) hypertension: Secondary | ICD-10-CM | POA: Diagnosis not present

## 2021-03-02 DIAGNOSIS — E1165 Type 2 diabetes mellitus with hyperglycemia: Secondary | ICD-10-CM | POA: Diagnosis not present

## 2021-03-02 DIAGNOSIS — E7800 Pure hypercholesterolemia, unspecified: Secondary | ICD-10-CM | POA: Diagnosis not present

## 2021-03-26 DIAGNOSIS — Z20822 Contact with and (suspected) exposure to covid-19: Secondary | ICD-10-CM | POA: Diagnosis not present

## 2021-04-01 DIAGNOSIS — E1165 Type 2 diabetes mellitus with hyperglycemia: Secondary | ICD-10-CM | POA: Diagnosis not present

## 2021-04-21 DIAGNOSIS — Z20822 Contact with and (suspected) exposure to covid-19: Secondary | ICD-10-CM | POA: Diagnosis not present

## 2021-04-22 DIAGNOSIS — E1122 Type 2 diabetes mellitus with diabetic chronic kidney disease: Secondary | ICD-10-CM | POA: Diagnosis not present

## 2021-04-22 DIAGNOSIS — R600 Localized edema: Secondary | ICD-10-CM | POA: Diagnosis not present

## 2021-04-22 DIAGNOSIS — N183 Chronic kidney disease, stage 3 unspecified: Secondary | ICD-10-CM | POA: Diagnosis not present

## 2021-04-22 DIAGNOSIS — Z299 Encounter for prophylactic measures, unspecified: Secondary | ICD-10-CM | POA: Diagnosis not present

## 2021-04-22 DIAGNOSIS — Z6841 Body Mass Index (BMI) 40.0 and over, adult: Secondary | ICD-10-CM | POA: Diagnosis not present

## 2021-04-22 DIAGNOSIS — I10 Essential (primary) hypertension: Secondary | ICD-10-CM | POA: Diagnosis not present

## 2021-04-22 DIAGNOSIS — E1165 Type 2 diabetes mellitus with hyperglycemia: Secondary | ICD-10-CM | POA: Diagnosis not present

## 2021-04-27 DIAGNOSIS — Z20822 Contact with and (suspected) exposure to covid-19: Secondary | ICD-10-CM | POA: Diagnosis not present

## 2021-05-02 DIAGNOSIS — E1165 Type 2 diabetes mellitus with hyperglycemia: Secondary | ICD-10-CM | POA: Diagnosis not present

## 2021-05-04 DIAGNOSIS — Z20822 Contact with and (suspected) exposure to covid-19: Secondary | ICD-10-CM | POA: Diagnosis not present

## 2021-06-01 DIAGNOSIS — E1165 Type 2 diabetes mellitus with hyperglycemia: Secondary | ICD-10-CM | POA: Diagnosis not present

## 2021-06-02 DIAGNOSIS — I10 Essential (primary) hypertension: Secondary | ICD-10-CM | POA: Diagnosis not present

## 2021-06-02 DIAGNOSIS — M109 Gout, unspecified: Secondary | ICD-10-CM | POA: Diagnosis not present

## 2021-07-01 DIAGNOSIS — E1165 Type 2 diabetes mellitus with hyperglycemia: Secondary | ICD-10-CM | POA: Diagnosis not present

## 2021-07-02 DIAGNOSIS — M109 Gout, unspecified: Secondary | ICD-10-CM | POA: Diagnosis not present

## 2021-07-02 DIAGNOSIS — I10 Essential (primary) hypertension: Secondary | ICD-10-CM | POA: Diagnosis not present

## 2021-07-28 DIAGNOSIS — I6990 Unspecified sequelae of unspecified cerebrovascular disease: Secondary | ICD-10-CM | POA: Diagnosis not present

## 2021-07-28 DIAGNOSIS — R5383 Other fatigue: Secondary | ICD-10-CM | POA: Diagnosis not present

## 2021-07-28 DIAGNOSIS — G4733 Obstructive sleep apnea (adult) (pediatric): Secondary | ICD-10-CM | POA: Diagnosis not present

## 2021-07-28 DIAGNOSIS — I6529 Occlusion and stenosis of unspecified carotid artery: Secondary | ICD-10-CM | POA: Diagnosis not present

## 2021-07-29 DIAGNOSIS — Z6841 Body Mass Index (BMI) 40.0 and over, adult: Secondary | ICD-10-CM | POA: Diagnosis not present

## 2021-07-29 DIAGNOSIS — Z713 Dietary counseling and surveillance: Secondary | ICD-10-CM | POA: Diagnosis not present

## 2021-07-29 DIAGNOSIS — I10 Essential (primary) hypertension: Secondary | ICD-10-CM | POA: Diagnosis not present

## 2021-07-29 DIAGNOSIS — Z299 Encounter for prophylactic measures, unspecified: Secondary | ICD-10-CM | POA: Diagnosis not present

## 2021-07-29 DIAGNOSIS — E1165 Type 2 diabetes mellitus with hyperglycemia: Secondary | ICD-10-CM | POA: Diagnosis not present

## 2021-08-02 DIAGNOSIS — E1165 Type 2 diabetes mellitus with hyperglycemia: Secondary | ICD-10-CM | POA: Diagnosis not present

## 2021-09-01 DIAGNOSIS — Z0000 Encounter for general adult medical examination without abnormal findings: Secondary | ICD-10-CM | POA: Diagnosis not present

## 2021-09-01 DIAGNOSIS — Z6841 Body Mass Index (BMI) 40.0 and over, adult: Secondary | ICD-10-CM | POA: Diagnosis not present

## 2021-09-01 DIAGNOSIS — Z1211 Encounter for screening for malignant neoplasm of colon: Secondary | ICD-10-CM | POA: Diagnosis not present

## 2021-09-01 DIAGNOSIS — E1165 Type 2 diabetes mellitus with hyperglycemia: Secondary | ICD-10-CM | POA: Diagnosis not present

## 2021-09-01 DIAGNOSIS — E7800 Pure hypercholesterolemia, unspecified: Secondary | ICD-10-CM | POA: Diagnosis not present

## 2021-09-01 DIAGNOSIS — E119 Type 2 diabetes mellitus without complications: Secondary | ICD-10-CM | POA: Diagnosis not present

## 2021-09-01 DIAGNOSIS — Z299 Encounter for prophylactic measures, unspecified: Secondary | ICD-10-CM | POA: Diagnosis not present

## 2021-09-01 DIAGNOSIS — Z79899 Other long term (current) drug therapy: Secondary | ICD-10-CM | POA: Diagnosis not present

## 2021-09-01 DIAGNOSIS — Z1339 Encounter for screening examination for other mental health and behavioral disorders: Secondary | ICD-10-CM | POA: Diagnosis not present

## 2021-09-01 DIAGNOSIS — Z1331 Encounter for screening for depression: Secondary | ICD-10-CM | POA: Diagnosis not present

## 2021-09-01 DIAGNOSIS — R5383 Other fatigue: Secondary | ICD-10-CM | POA: Diagnosis not present

## 2021-09-01 DIAGNOSIS — Z7189 Other specified counseling: Secondary | ICD-10-CM | POA: Diagnosis not present

## 2021-09-01 DIAGNOSIS — E559 Vitamin D deficiency, unspecified: Secondary | ICD-10-CM | POA: Diagnosis not present

## 2021-09-28 DIAGNOSIS — Z23 Encounter for immunization: Secondary | ICD-10-CM | POA: Diagnosis not present

## 2021-10-01 DIAGNOSIS — E1165 Type 2 diabetes mellitus with hyperglycemia: Secondary | ICD-10-CM | POA: Diagnosis not present

## 2021-11-10 DIAGNOSIS — I10 Essential (primary) hypertension: Secondary | ICD-10-CM | POA: Diagnosis not present

## 2021-11-10 DIAGNOSIS — Z789 Other specified health status: Secondary | ICD-10-CM | POA: Diagnosis not present

## 2021-11-10 DIAGNOSIS — I152 Hypertension secondary to endocrine disorders: Secondary | ICD-10-CM | POA: Diagnosis not present

## 2021-11-10 DIAGNOSIS — Z299 Encounter for prophylactic measures, unspecified: Secondary | ICD-10-CM | POA: Diagnosis not present

## 2021-11-10 DIAGNOSIS — E1159 Type 2 diabetes mellitus with other circulatory complications: Secondary | ICD-10-CM | POA: Diagnosis not present

## 2021-12-20 DIAGNOSIS — G4733 Obstructive sleep apnea (adult) (pediatric): Secondary | ICD-10-CM | POA: Diagnosis not present

## 2021-12-31 DIAGNOSIS — E1165 Type 2 diabetes mellitus with hyperglycemia: Secondary | ICD-10-CM | POA: Diagnosis not present

## 2022-02-24 DIAGNOSIS — N183 Chronic kidney disease, stage 3 unspecified: Secondary | ICD-10-CM | POA: Diagnosis not present

## 2022-02-24 DIAGNOSIS — Z299 Encounter for prophylactic measures, unspecified: Secondary | ICD-10-CM | POA: Diagnosis not present

## 2022-02-24 DIAGNOSIS — I10 Essential (primary) hypertension: Secondary | ICD-10-CM | POA: Diagnosis not present

## 2022-02-24 DIAGNOSIS — E1165 Type 2 diabetes mellitus with hyperglycemia: Secondary | ICD-10-CM | POA: Diagnosis not present

## 2022-02-28 DIAGNOSIS — Z1231 Encounter for screening mammogram for malignant neoplasm of breast: Secondary | ICD-10-CM | POA: Diagnosis not present

## 2022-03-02 DIAGNOSIS — E1165 Type 2 diabetes mellitus with hyperglycemia: Secondary | ICD-10-CM | POA: Diagnosis not present

## 2022-03-11 DIAGNOSIS — Z1331 Encounter for screening for depression: Secondary | ICD-10-CM | POA: Diagnosis not present

## 2022-03-11 DIAGNOSIS — Z7189 Other specified counseling: Secondary | ICD-10-CM | POA: Diagnosis not present

## 2022-03-11 DIAGNOSIS — R5383 Other fatigue: Secondary | ICD-10-CM | POA: Diagnosis not present

## 2022-03-11 DIAGNOSIS — Z1339 Encounter for screening examination for other mental health and behavioral disorders: Secondary | ICD-10-CM | POA: Diagnosis not present

## 2022-03-11 DIAGNOSIS — Z6841 Body Mass Index (BMI) 40.0 and over, adult: Secondary | ICD-10-CM | POA: Diagnosis not present

## 2022-03-11 DIAGNOSIS — Z79899 Other long term (current) drug therapy: Secondary | ICD-10-CM | POA: Diagnosis not present

## 2022-03-11 DIAGNOSIS — I10 Essential (primary) hypertension: Secondary | ICD-10-CM | POA: Diagnosis not present

## 2022-03-11 DIAGNOSIS — Z0000 Encounter for general adult medical examination without abnormal findings: Secondary | ICD-10-CM | POA: Diagnosis not present

## 2022-03-11 DIAGNOSIS — E7800 Pure hypercholesterolemia, unspecified: Secondary | ICD-10-CM | POA: Diagnosis not present

## 2022-03-11 DIAGNOSIS — Z299 Encounter for prophylactic measures, unspecified: Secondary | ICD-10-CM | POA: Diagnosis not present

## 2022-03-14 DIAGNOSIS — Z6841 Body Mass Index (BMI) 40.0 and over, adult: Secondary | ICD-10-CM | POA: Diagnosis not present

## 2022-03-14 DIAGNOSIS — I10 Essential (primary) hypertension: Secondary | ICD-10-CM | POA: Diagnosis not present

## 2022-03-14 DIAGNOSIS — Z299 Encounter for prophylactic measures, unspecified: Secondary | ICD-10-CM | POA: Diagnosis not present

## 2022-03-28 DIAGNOSIS — Z6841 Body Mass Index (BMI) 40.0 and over, adult: Secondary | ICD-10-CM | POA: Diagnosis not present

## 2022-03-28 DIAGNOSIS — Z299 Encounter for prophylactic measures, unspecified: Secondary | ICD-10-CM | POA: Diagnosis not present

## 2022-03-28 DIAGNOSIS — I10 Essential (primary) hypertension: Secondary | ICD-10-CM | POA: Diagnosis not present

## 2022-04-02 DIAGNOSIS — E1165 Type 2 diabetes mellitus with hyperglycemia: Secondary | ICD-10-CM | POA: Diagnosis not present

## 2022-04-06 DIAGNOSIS — E1122 Type 2 diabetes mellitus with diabetic chronic kidney disease: Secondary | ICD-10-CM | POA: Diagnosis not present

## 2022-04-06 DIAGNOSIS — E1165 Type 2 diabetes mellitus with hyperglycemia: Secondary | ICD-10-CM | POA: Diagnosis not present

## 2022-04-06 DIAGNOSIS — N183 Chronic kidney disease, stage 3 unspecified: Secondary | ICD-10-CM | POA: Diagnosis not present

## 2022-04-06 DIAGNOSIS — Z299 Encounter for prophylactic measures, unspecified: Secondary | ICD-10-CM | POA: Diagnosis not present

## 2022-04-06 DIAGNOSIS — I10 Essential (primary) hypertension: Secondary | ICD-10-CM | POA: Diagnosis not present

## 2022-04-19 DIAGNOSIS — G4733 Obstructive sleep apnea (adult) (pediatric): Secondary | ICD-10-CM | POA: Diagnosis not present

## 2022-05-03 DIAGNOSIS — E1165 Type 2 diabetes mellitus with hyperglycemia: Secondary | ICD-10-CM | POA: Diagnosis not present

## 2022-06-03 DIAGNOSIS — E1165 Type 2 diabetes mellitus with hyperglycemia: Secondary | ICD-10-CM | POA: Diagnosis not present

## 2022-06-09 DIAGNOSIS — I152 Hypertension secondary to endocrine disorders: Secondary | ICD-10-CM | POA: Diagnosis not present

## 2022-06-09 DIAGNOSIS — Z299 Encounter for prophylactic measures, unspecified: Secondary | ICD-10-CM | POA: Diagnosis not present

## 2022-06-09 DIAGNOSIS — N183 Chronic kidney disease, stage 3 unspecified: Secondary | ICD-10-CM | POA: Diagnosis not present

## 2022-06-09 DIAGNOSIS — I10 Essential (primary) hypertension: Secondary | ICD-10-CM | POA: Diagnosis not present

## 2022-06-09 DIAGNOSIS — E1159 Type 2 diabetes mellitus with other circulatory complications: Secondary | ICD-10-CM | POA: Diagnosis not present

## 2022-06-09 DIAGNOSIS — E1122 Type 2 diabetes mellitus with diabetic chronic kidney disease: Secondary | ICD-10-CM | POA: Diagnosis not present

## 2022-07-03 DIAGNOSIS — E1165 Type 2 diabetes mellitus with hyperglycemia: Secondary | ICD-10-CM | POA: Diagnosis not present

## 2022-08-03 DIAGNOSIS — E1165 Type 2 diabetes mellitus with hyperglycemia: Secondary | ICD-10-CM | POA: Diagnosis not present

## 2022-08-12 DIAGNOSIS — Z79899 Other long term (current) drug therapy: Secondary | ICD-10-CM | POA: Diagnosis not present

## 2022-08-12 DIAGNOSIS — I10 Essential (primary) hypertension: Secondary | ICD-10-CM | POA: Diagnosis not present

## 2022-08-12 DIAGNOSIS — Z299 Encounter for prophylactic measures, unspecified: Secondary | ICD-10-CM | POA: Diagnosis not present

## 2022-08-12 DIAGNOSIS — R5383 Other fatigue: Secondary | ICD-10-CM | POA: Diagnosis not present

## 2022-08-12 DIAGNOSIS — G40909 Epilepsy, unspecified, not intractable, without status epilepticus: Secondary | ICD-10-CM | POA: Diagnosis not present

## 2022-08-12 DIAGNOSIS — E1122 Type 2 diabetes mellitus with diabetic chronic kidney disease: Secondary | ICD-10-CM | POA: Diagnosis not present

## 2022-08-12 DIAGNOSIS — N1831 Chronic kidney disease, stage 3a: Secondary | ICD-10-CM | POA: Diagnosis not present

## 2022-08-12 DIAGNOSIS — E7800 Pure hypercholesterolemia, unspecified: Secondary | ICD-10-CM | POA: Diagnosis not present

## 2022-08-12 DIAGNOSIS — Z0000 Encounter for general adult medical examination without abnormal findings: Secondary | ICD-10-CM | POA: Diagnosis not present

## 2022-09-03 DIAGNOSIS — E1165 Type 2 diabetes mellitus with hyperglycemia: Secondary | ICD-10-CM | POA: Diagnosis not present

## 2022-10-03 DIAGNOSIS — E1165 Type 2 diabetes mellitus with hyperglycemia: Secondary | ICD-10-CM | POA: Diagnosis not present

## 2022-10-18 DIAGNOSIS — N183 Chronic kidney disease, stage 3 unspecified: Secondary | ICD-10-CM | POA: Diagnosis not present

## 2022-10-18 DIAGNOSIS — E1122 Type 2 diabetes mellitus with diabetic chronic kidney disease: Secondary | ICD-10-CM | POA: Diagnosis not present

## 2022-10-18 DIAGNOSIS — E1159 Type 2 diabetes mellitus with other circulatory complications: Secondary | ICD-10-CM | POA: Diagnosis not present

## 2022-10-18 DIAGNOSIS — I10 Essential (primary) hypertension: Secondary | ICD-10-CM | POA: Diagnosis not present

## 2022-10-18 DIAGNOSIS — Z299 Encounter for prophylactic measures, unspecified: Secondary | ICD-10-CM | POA: Diagnosis not present

## 2022-10-18 DIAGNOSIS — I152 Hypertension secondary to endocrine disorders: Secondary | ICD-10-CM | POA: Diagnosis not present

## 2022-10-24 DIAGNOSIS — E1165 Type 2 diabetes mellitus with hyperglycemia: Secondary | ICD-10-CM | POA: Diagnosis not present

## 2022-10-24 DIAGNOSIS — E119 Type 2 diabetes mellitus without complications: Secondary | ICD-10-CM | POA: Diagnosis not present

## 2022-11-02 DIAGNOSIS — E1165 Type 2 diabetes mellitus with hyperglycemia: Secondary | ICD-10-CM | POA: Diagnosis not present

## 2022-12-02 DIAGNOSIS — E1165 Type 2 diabetes mellitus with hyperglycemia: Secondary | ICD-10-CM | POA: Diagnosis not present

## 2023-01-02 DIAGNOSIS — E1165 Type 2 diabetes mellitus with hyperglycemia: Secondary | ICD-10-CM | POA: Diagnosis not present

## 2023-06-01 ENCOUNTER — Other Ambulatory Visit (HOSPITAL_COMMUNITY): Payer: Self-pay | Admitting: Interventional Radiology

## 2023-06-01 DIAGNOSIS — I671 Cerebral aneurysm, nonruptured: Secondary | ICD-10-CM

## 2023-06-15 ENCOUNTER — Ambulatory Visit (HOSPITAL_COMMUNITY)
Admission: RE | Admit: 2023-06-15 | Discharge: 2023-06-15 | Disposition: A | Discharge status: Home / Self Care | Source: Ambulatory Visit | Attending: Interventional Radiology | Admitting: Interventional Radiology

## 2023-06-15 DIAGNOSIS — I671 Cerebral aneurysm, nonruptured: Secondary | ICD-10-CM | POA: Insufficient documentation

## 2023-06-15 DIAGNOSIS — E1100 Type 2 diabetes mellitus with hyperosmolarity without nonketotic hyperglycemic-hyperosmolar coma (NKHHC): Secondary | ICD-10-CM | POA: Insufficient documentation

## 2023-06-15 LAB — POCT I-STAT CREATININE: Creatinine, Ser: 1.4 mg/dL — ABNORMAL HIGH (ref 0.44–1.00)

## 2023-06-15 MED ORDER — IOHEXOL 350 MG/ML SOLN
75.0000 mL | Freq: Once | INTRAVENOUS | Status: AC | PRN
  Administered 2023-06-15: 75 mL via INTRAVENOUS

## 2023-06-15 MED ORDER — SODIUM CHLORIDE (PF) 0.9 % IJ SOLN
INTRAMUSCULAR | Status: AC
  Filled 2023-06-15: qty 50

## 2023-06-29 ENCOUNTER — Telehealth (HOSPITAL_COMMUNITY): Payer: Self-pay

## 2023-06-29 NOTE — Telephone Encounter (Signed)
Returned pt's call, no answer, vm full. AB

## 2023-07-01 ENCOUNTER — Encounter (HOSPITAL_COMMUNITY): Payer: Self-pay | Admitting: Interventional Radiology

## 2023-07-05 ENCOUNTER — Telehealth (HOSPITAL_COMMUNITY): Payer: Self-pay

## 2023-07-05 NOTE — Telephone Encounter (Signed)
 Dr. Dolphus reviewed pt's recent CTA and would like her to f/u in 1 year. She is aware that Dr. Dolphus is leaving and she will f/u with her PCP for next imaging scan. AB

## 2024-01-11 ENCOUNTER — Ambulatory Visit: Admitting: Podiatry

## 2024-01-11 ENCOUNTER — Encounter: Payer: Self-pay | Admitting: Podiatry

## 2024-01-11 DIAGNOSIS — B351 Tinea unguium: Secondary | ICD-10-CM

## 2024-01-11 DIAGNOSIS — M79674 Pain in right toe(s): Secondary | ICD-10-CM | POA: Diagnosis not present

## 2024-01-11 DIAGNOSIS — M79675 Pain in left toe(s): Secondary | ICD-10-CM | POA: Diagnosis not present

## 2024-01-12 NOTE — Progress Notes (Signed)
 Subjective:   Patient ID: [name] All, female   DOB: 74 y.o.   MRN: [id number]   HPI Patient presents stating she gets very thick toenails that become painful and hard for her to wear shoe gear with.  States she has tried trimming other nails but is not able to do it and presents with caregiver.  Patient does not smoke tries to be active   Review of Systems  All other systems reviewed and are negative.       Objective:  Physical Exam Vitals and nursing note reviewed.  Constitutional:      Appearance: She is well-developed.  Pulmonary:     Effort: Pulmonary effort is normal.  Musculoskeletal:        General: Normal range of motion.  Skin:    General: Skin is warm.  Neurological:     Mental Status: She is alert.     Neurovascular status intact muscle strength found to be adequate range of motion adequate with patient noted to have dystrophic painful nailbeds 1-5 both feet that are very hard for her to cut and are sore with shoe gear.  Patient has good digital perfusion well oriented x 3     Assessment:  Chronic mycotic nail infection 1-5 both feet with pain     Plan:  Debridement painful nailbeds 1-5 both feet no iatrogenic bleeding reappoint routine care with education given concerning nail disease

## 2024-04-10 ENCOUNTER — Ambulatory Visit: Admitting: Podiatry

## 2024-05-02 ENCOUNTER — Ambulatory Visit: Admitting: Neurology
